# Patient Record
Sex: Female | Born: 1945 | Race: Black or African American | Hispanic: No | Marital: Single | Smoking: Former smoker
Health system: Southern US, Community
[De-identification: ages and names within clinical notes are randomized; demographics above are authoritative.]

## PROBLEM LIST (undated history)

## (undated) DIAGNOSIS — K469 Unspecified abdominal hernia without obstruction or gangrene: Secondary | ICD-10-CM

## (undated) DIAGNOSIS — I10 Essential (primary) hypertension: Secondary | ICD-10-CM

## (undated) DIAGNOSIS — K589 Irritable bowel syndrome without diarrhea: Secondary | ICD-10-CM

## (undated) DIAGNOSIS — M1990 Unspecified osteoarthritis, unspecified site: Secondary | ICD-10-CM

## (undated) DIAGNOSIS — K219 Gastro-esophageal reflux disease without esophagitis: Secondary | ICD-10-CM

## (undated) DIAGNOSIS — E876 Hypokalemia: Secondary | ICD-10-CM

## (undated) DIAGNOSIS — G8929 Other chronic pain: Secondary | ICD-10-CM

## (undated) DIAGNOSIS — E669 Obesity, unspecified: Secondary | ICD-10-CM

## (undated) DIAGNOSIS — E119 Type 2 diabetes mellitus without complications: Secondary | ICD-10-CM

## (undated) DIAGNOSIS — I639 Cerebral infarction, unspecified: Secondary | ICD-10-CM

## (undated) DIAGNOSIS — K5730 Diverticulosis of large intestine without perforation or abscess without bleeding: Secondary | ICD-10-CM

## (undated) DIAGNOSIS — D126 Benign neoplasm of colon, unspecified: Secondary | ICD-10-CM

## (undated) DIAGNOSIS — M549 Dorsalgia, unspecified: Secondary | ICD-10-CM

## (undated) DIAGNOSIS — IMO0002 Reserved for concepts with insufficient information to code with codable children: Secondary | ICD-10-CM

## (undated) DIAGNOSIS — E785 Hyperlipidemia, unspecified: Secondary | ICD-10-CM

## (undated) DIAGNOSIS — K602 Anal fissure, unspecified: Secondary | ICD-10-CM

## (undated) DIAGNOSIS — I6529 Occlusion and stenosis of unspecified carotid artery: Secondary | ICD-10-CM

## (undated) HISTORY — DX: Essential (primary) hypertension: I10

## (undated) HISTORY — DX: Unspecified osteoarthritis, unspecified site: M19.90

## (undated) HISTORY — DX: Hypokalemia: E87.6

## (undated) HISTORY — DX: Anal fissure, unspecified: K60.2

## (undated) HISTORY — DX: Other chronic pain: G89.29

## (undated) HISTORY — DX: Occlusion and stenosis of unspecified carotid artery: I65.29

## (undated) HISTORY — PX: POLYPECTOMY: SHX149

## (undated) HISTORY — DX: Cerebral infarction, unspecified: I63.9

## (undated) HISTORY — DX: Hyperlipidemia, unspecified: E78.5

## (undated) HISTORY — DX: Obesity, unspecified: E66.9

## (undated) HISTORY — PX: TONSILLECTOMY: SUR1361

## (undated) HISTORY — DX: Irritable bowel syndrome, unspecified: K58.9

## (undated) HISTORY — DX: Benign neoplasm of colon, unspecified: D12.6

## (undated) HISTORY — DX: Reserved for concepts with insufficient information to code with codable children: IMO0002

## (undated) HISTORY — PX: COLONOSCOPY: SHX174

## (undated) HISTORY — DX: Gastro-esophageal reflux disease without esophagitis: K21.9

## (undated) HISTORY — DX: Type 2 diabetes mellitus without complications: E11.9

## (undated) HISTORY — DX: Dorsalgia, unspecified: M54.9

## (undated) HISTORY — DX: Diverticulosis of large intestine without perforation or abscess without bleeding: K57.30

## (undated) HISTORY — DX: Unspecified abdominal hernia without obstruction or gangrene: K46.9

---

## 1969-04-16 HISTORY — PX: PARTIAL HYSTERECTOMY: SHX80

## 1999-02-03 ENCOUNTER — Emergency Department (HOSPITAL_COMMUNITY): Admission: EM | Admit: 1999-02-03 | Discharge: 1999-02-03 | Payer: Self-pay | Admitting: Emergency Medicine

## 2001-11-17 ENCOUNTER — Encounter: Payer: Self-pay | Admitting: Emergency Medicine

## 2001-11-17 ENCOUNTER — Emergency Department (HOSPITAL_COMMUNITY): Admission: EM | Admit: 2001-11-17 | Discharge: 2001-11-17 | Payer: Self-pay | Admitting: Emergency Medicine

## 2002-06-15 ENCOUNTER — Emergency Department (HOSPITAL_COMMUNITY): Admission: EM | Admit: 2002-06-15 | Discharge: 2002-06-15 | Payer: Self-pay | Admitting: Emergency Medicine

## 2002-06-15 ENCOUNTER — Encounter: Payer: Self-pay | Admitting: Emergency Medicine

## 2004-10-31 ENCOUNTER — Emergency Department (HOSPITAL_COMMUNITY): Admission: EM | Admit: 2004-10-31 | Discharge: 2004-10-31 | Payer: Self-pay | Admitting: Emergency Medicine

## 2007-04-17 DIAGNOSIS — K5730 Diverticulosis of large intestine without perforation or abscess without bleeding: Secondary | ICD-10-CM

## 2007-04-17 HISTORY — DX: Diverticulosis of large intestine without perforation or abscess without bleeding: K57.30

## 2007-05-01 ENCOUNTER — Inpatient Hospital Stay (HOSPITAL_COMMUNITY): Admission: EM | Admit: 2007-05-01 | Discharge: 2007-05-04 | Payer: Self-pay | Admitting: Emergency Medicine

## 2007-05-03 ENCOUNTER — Encounter: Payer: Self-pay | Admitting: Gastroenterology

## 2007-05-07 ENCOUNTER — Ambulatory Visit: Payer: Self-pay | Admitting: Gastroenterology

## 2009-07-04 ENCOUNTER — Encounter: Payer: Self-pay | Admitting: *Deleted

## 2009-07-19 ENCOUNTER — Emergency Department (HOSPITAL_COMMUNITY): Admission: EM | Admit: 2009-07-19 | Discharge: 2009-07-19 | Payer: Self-pay | Admitting: Emergency Medicine

## 2009-07-21 ENCOUNTER — Emergency Department (HOSPITAL_COMMUNITY): Admission: EM | Admit: 2009-07-21 | Discharge: 2009-07-22 | Payer: Self-pay | Admitting: Emergency Medicine

## 2009-07-28 ENCOUNTER — Emergency Department (HOSPITAL_COMMUNITY): Admission: EM | Admit: 2009-07-28 | Discharge: 2009-07-28 | Payer: Self-pay | Admitting: Emergency Medicine

## 2009-08-07 ENCOUNTER — Emergency Department (HOSPITAL_COMMUNITY): Admission: EM | Admit: 2009-08-07 | Discharge: 2009-08-07 | Payer: Self-pay | Admitting: Emergency Medicine

## 2009-08-07 ENCOUNTER — Encounter (INDEPENDENT_AMBULATORY_CARE_PROVIDER_SITE_OTHER): Payer: Self-pay | Admitting: Emergency Medicine

## 2009-08-07 ENCOUNTER — Ambulatory Visit: Payer: Self-pay | Admitting: Surgery

## 2009-08-20 ENCOUNTER — Emergency Department (HOSPITAL_COMMUNITY): Admission: EM | Admit: 2009-08-20 | Discharge: 2009-08-20 | Payer: Self-pay | Admitting: Emergency Medicine

## 2009-08-25 ENCOUNTER — Inpatient Hospital Stay (HOSPITAL_COMMUNITY): Admission: EM | Admit: 2009-08-25 | Discharge: 2009-08-28 | Payer: Self-pay | Admitting: Emergency Medicine

## 2009-09-16 ENCOUNTER — Ambulatory Visit: Payer: Self-pay | Admitting: Family Medicine

## 2009-11-17 ENCOUNTER — Inpatient Hospital Stay (HOSPITAL_COMMUNITY): Admission: EM | Admit: 2009-11-17 | Discharge: 2009-11-17 | Payer: Self-pay | Admitting: Emergency Medicine

## 2010-02-03 ENCOUNTER — Ambulatory Visit: Payer: Self-pay | Admitting: Family Medicine

## 2010-02-03 ENCOUNTER — Encounter (INDEPENDENT_AMBULATORY_CARE_PROVIDER_SITE_OTHER): Payer: Self-pay | Admitting: *Deleted

## 2010-02-03 LAB — CONVERTED CEMR LAB
BUN: 11 mg/dL (ref 6–23)
Basophils Absolute: 0 10*3/uL (ref 0.0–0.1)
Basophils Relative: 0 % (ref 0–1)
CO2: 23 meq/L (ref 19–32)
Calcium: 9.2 mg/dL (ref 8.4–10.5)
Chloride: 109 meq/L (ref 96–112)
Creatinine, Ser: 0.72 mg/dL (ref 0.40–1.20)
Eosinophils Absolute: 0.2 10*3/uL (ref 0.0–0.7)
Eosinophils Relative: 3 % (ref 0–5)
Glucose, Bld: 113 mg/dL — ABNORMAL HIGH (ref 70–99)
HCT: 40.2 % (ref 36.0–46.0)
Hemoglobin: 13.3 g/dL (ref 12.0–15.0)
Lymphocytes Relative: 38 % (ref 12–46)
Lymphs Abs: 2.8 10*3/uL (ref 0.7–4.0)
MCHC: 33.1 g/dL (ref 30.0–36.0)
MCV: 86.6 fL (ref 78.0–100.0)
Monocytes Absolute: 0.8 10*3/uL (ref 0.1–1.0)
Monocytes Relative: 11 % (ref 3–12)
Neutro Abs: 3.5 10*3/uL (ref 1.7–7.7)
Neutrophils Relative %: 48 % (ref 43–77)
Platelets: 279 10*3/uL (ref 150–400)
Potassium: 4.2 meq/L (ref 3.5–5.3)
RBC: 4.64 M/uL (ref 3.87–5.11)
RDW: 13.7 % (ref 11.5–15.5)
Sodium: 144 meq/L (ref 135–145)
WBC: 7.2 10*3/uL (ref 4.0–10.5)

## 2010-02-21 ENCOUNTER — Ambulatory Visit (HOSPITAL_COMMUNITY): Admission: RE | Admit: 2010-02-21 | Discharge: 2010-02-21 | Payer: Self-pay | Admitting: Family Medicine

## 2010-04-11 ENCOUNTER — Encounter (INDEPENDENT_AMBULATORY_CARE_PROVIDER_SITE_OTHER): Payer: Self-pay | Admitting: *Deleted

## 2010-04-11 LAB — CONVERTED CEMR LAB
ALT: 14 units/L (ref 0–35)
AST: 16 units/L (ref 0–37)
Albumin: 4.4 g/dL (ref 3.5–5.2)
Alkaline Phosphatase: 92 units/L (ref 39–117)
Amphetamine Screen, Ur: NEGATIVE
BUN: 16 mg/dL (ref 6–23)
Barbiturate Quant, Ur: NEGATIVE
Benzodiazepines.: NEGATIVE
CO2: 24 meq/L (ref 19–32)
Calcium: 9.7 mg/dL (ref 8.4–10.5)
Chloride: 106 meq/L (ref 96–112)
Cocaine Metabolites: NEGATIVE
Creatinine, Ser: 0.83 mg/dL (ref 0.40–1.20)
Creatinine,U: 80.5 mg/dL
Glucose, Bld: 110 mg/dL — ABNORMAL HIGH (ref 70–99)
Lipase: 33 units/L (ref 0–75)
Marijuana Metabolite: POSITIVE — AB
Methadone: NEGATIVE
Opiate Screen, Urine: NEGATIVE
Phencyclidine (PCP): NEGATIVE
Potassium: 4 meq/L (ref 3.5–5.3)
Propoxyphene: NEGATIVE
Sodium: 142 meq/L (ref 135–145)
TSH: 1.875 microintl units/mL (ref 0.350–4.500)
Total Bilirubin: 0.2 mg/dL — ABNORMAL LOW (ref 0.3–1.2)
Total Protein: 7.7 g/dL (ref 6.0–8.3)
Vitamin B-12: 268 pg/mL (ref 211–911)

## 2010-04-20 ENCOUNTER — Ambulatory Visit (HOSPITAL_COMMUNITY): Admission: RE | Admit: 2010-04-20 | Payer: Self-pay | Source: Home / Self Care | Admitting: Family Medicine

## 2010-04-20 ENCOUNTER — Emergency Department (HOSPITAL_COMMUNITY)
Admission: EM | Admit: 2010-04-20 | Discharge: 2010-04-20 | Payer: Self-pay | Source: Home / Self Care | Admitting: Emergency Medicine

## 2010-05-12 ENCOUNTER — Emergency Department (HOSPITAL_COMMUNITY)
Admission: EM | Admit: 2010-05-12 | Discharge: 2010-05-12 | Payer: Self-pay | Source: Home / Self Care | Admitting: Emergency Medicine

## 2010-05-17 ENCOUNTER — Encounter: Payer: Self-pay | Admitting: Family Medicine

## 2010-05-18 NOTE — Miscellaneous (Signed)
Summary: Do Not Reschedule  Missed NP appt.  Per [facility] policy is not allowed to reschedule.  Dennison Nancy RN  July 04, 2009 4:33 PM

## 2010-06-14 ENCOUNTER — Emergency Department (HOSPITAL_COMMUNITY): Payer: Medicare Other

## 2010-06-14 ENCOUNTER — Observation Stay (HOSPITAL_COMMUNITY)
Admission: EM | Admit: 2010-06-14 | Discharge: 2010-06-15 | Disposition: A | Discharge status: Home / Self Care | Payer: Medicare Other | Attending: Internal Medicine | Admitting: Internal Medicine

## 2010-06-14 DIAGNOSIS — J45909 Unspecified asthma, uncomplicated: Secondary | ICD-10-CM | POA: Insufficient documentation

## 2010-06-14 DIAGNOSIS — G8929 Other chronic pain: Secondary | ICD-10-CM | POA: Insufficient documentation

## 2010-06-14 DIAGNOSIS — I10 Essential (primary) hypertension: Secondary | ICD-10-CM | POA: Insufficient documentation

## 2010-06-14 DIAGNOSIS — K219 Gastro-esophageal reflux disease without esophagitis: Secondary | ICD-10-CM | POA: Insufficient documentation

## 2010-06-14 DIAGNOSIS — I2510 Atherosclerotic heart disease of native coronary artery without angina pectoris: Secondary | ICD-10-CM | POA: Insufficient documentation

## 2010-06-14 DIAGNOSIS — Z79899 Other long term (current) drug therapy: Secondary | ICD-10-CM | POA: Insufficient documentation

## 2010-06-14 DIAGNOSIS — F17200 Nicotine dependence, unspecified, uncomplicated: Secondary | ICD-10-CM | POA: Insufficient documentation

## 2010-06-14 DIAGNOSIS — R0789 Other chest pain: Principal | ICD-10-CM | POA: Insufficient documentation

## 2010-06-14 DIAGNOSIS — F1210 Cannabis abuse, uncomplicated: Secondary | ICD-10-CM | POA: Insufficient documentation

## 2010-06-14 DIAGNOSIS — E876 Hypokalemia: Secondary | ICD-10-CM | POA: Insufficient documentation

## 2010-06-14 DIAGNOSIS — M545 Low back pain, unspecified: Secondary | ICD-10-CM | POA: Insufficient documentation

## 2010-06-14 DIAGNOSIS — Z8673 Personal history of transient ischemic attack (TIA), and cerebral infarction without residual deficits: Secondary | ICD-10-CM | POA: Insufficient documentation

## 2010-06-14 DIAGNOSIS — E785 Hyperlipidemia, unspecified: Secondary | ICD-10-CM | POA: Insufficient documentation

## 2010-06-14 LAB — CBC
HCT: 41.8 % (ref 36.0–46.0)
Hemoglobin: 14.3 g/dL (ref 12.0–15.0)
MCH: 27.7 pg (ref 26.0–34.0)
MCHC: 34.2 g/dL (ref 30.0–36.0)
MCV: 80.9 fL (ref 78.0–100.0)
Platelets: 343 10*3/uL (ref 150–400)
RBC: 5.17 MIL/uL — ABNORMAL HIGH (ref 3.87–5.11)
RDW: 13.2 % (ref 11.5–15.5)
WBC: 8.7 10*3/uL (ref 4.0–10.5)

## 2010-06-14 LAB — POCT CARDIAC MARKERS
CKMB, poc: <1 ng/mL — ABNORMAL LOW (ref 1.0–8.0)
Myoglobin, poc: 58.1 ng/mL (ref 12–200)
Troponin i, poc: <0.05 ng/mL (ref 0.00–0.09)

## 2010-06-14 LAB — COMPREHENSIVE METABOLIC PANEL
ALT: 22 U/L (ref 0–35)
AST: 22 U/L (ref 0–37)
Albumin: 3.8 g/dL (ref 3.5–5.2)
Alkaline Phosphatase: 88 U/L (ref 39–117)
BUN: 7 mg/dL (ref 6–23)
CO2: 29 mEq/L (ref 19–32)
Calcium: 9.5 mg/dL (ref 8.4–10.5)
Chloride: 96 mEq/L (ref 96–112)
Creatinine, Ser: 0.88 mg/dL (ref 0.4–1.2)
GFR calc Af Amer: >60 mL/min (ref >60)
GFR calc non Af Amer: >60 mL/min (ref >60)
Glucose, Bld: 154 mg/dL — ABNORMAL HIGH (ref 70–99)
Potassium: 2.7 mEq/L — CL (ref 3.5–5.1)
Sodium: 137 mEq/L (ref 135–145)
Total Bilirubin: 0.4 mg/dL (ref 0.3–1.2)
Total Protein: 7.6 g/dL (ref 6.0–8.3)

## 2010-06-14 LAB — RAPID URINE DRUG SCREEN, HOSP PERFORMED
Amphetamines: NOT DETECTED
Barbiturates: NOT DETECTED
Benzodiazepines: NOT DETECTED
Cocaine: NOT DETECTED
Opiates: POSITIVE — AB
Tetrahydrocannabinol: POSITIVE — AB

## 2010-06-14 LAB — HEMOGLOBIN A1C
Hgb A1c MFr Bld: 8.5 % — ABNORMAL HIGH (ref <5.7)
Mean Plasma Glucose: 197 mg/dL — ABNORMAL HIGH (ref <117)

## 2010-06-14 LAB — DIFFERENTIAL
Basophils Absolute: 0 10*3/uL (ref 0.0–0.1)
Basophils Relative: 0 % (ref 0–1)
Eosinophils Absolute: 0.1 10*3/uL (ref 0.0–0.7)
Eosinophils Relative: 2 % (ref 0–5)
Lymphocytes Relative: 46 % (ref 12–46)
Lymphs Abs: 4 10*3/uL (ref 0.7–4.0)
Monocytes Absolute: 0.7 10*3/uL (ref 0.1–1.0)
Monocytes Relative: 8 % (ref 3–12)
Neutro Abs: 3.8 10*3/uL (ref 1.7–7.7)
Neutrophils Relative %: 44 % (ref 43–77)

## 2010-06-14 LAB — PROTIME-INR
INR: 0.96 (ref 0.00–1.49)
Prothrombin Time: 13 seconds (ref 11.6–15.2)

## 2010-06-14 LAB — TROPONIN I: Troponin I: 0.02 ng/mL (ref 0.00–0.06)

## 2010-06-14 LAB — CK TOTAL AND CKMB (NOT AT ARMC)
CK, MB: 1.2 ng/mL (ref 0.3–4.0)
Relative Index: INVALID (ref 0.0–2.5)
Total CK: 94 U/L (ref 7–177)

## 2010-06-14 LAB — APTT: aPTT: 30 seconds (ref 24–37)

## 2010-06-15 ENCOUNTER — Ambulatory Visit (HOSPITAL_COMMUNITY)
Admission: AD | Admit: 2010-06-15 | Discharge: 2010-06-15 | Disposition: A | Discharge status: Home / Self Care | Payer: Medicare Other | Source: Ambulatory Visit | Attending: Cardiovascular Disease | Admitting: Cardiovascular Disease

## 2010-06-15 DIAGNOSIS — F17200 Nicotine dependence, unspecified, uncomplicated: Secondary | ICD-10-CM | POA: Insufficient documentation

## 2010-06-15 DIAGNOSIS — I2510 Atherosclerotic heart disease of native coronary artery without angina pectoris: Secondary | ICD-10-CM | POA: Insufficient documentation

## 2010-06-15 DIAGNOSIS — R0789 Other chest pain: Secondary | ICD-10-CM | POA: Insufficient documentation

## 2010-06-15 LAB — CBC
HCT: 37.7 % (ref 36.0–46.0)
Hemoglobin: 12.6 g/dL (ref 12.0–15.0)
MCH: 27.5 pg (ref 26.0–34.0)
MCHC: 33.4 g/dL (ref 30.0–36.0)
MCV: 82.1 fL (ref 78.0–100.0)
Platelets: 319 10*3/uL (ref 150–400)
RBC: 4.59 MIL/uL (ref 3.87–5.11)
RDW: 13.4 % (ref 11.5–15.5)
WBC: 7.9 10*3/uL (ref 4.0–10.5)

## 2010-06-15 LAB — CARDIAC PANEL(CRET KIN+CKTOT+MB+TROPI)
CK, MB: 1.1 ng/mL (ref 0.3–4.0)
CK, MB: 1 ng/mL (ref 0.3–4.0)
Relative Index: 1 (ref 0.0–2.5)
Relative Index: 1 (ref 0.0–2.5)
Total CK: 108 U/L (ref 7–177)
Total CK: 103 U/L (ref 7–177)
Troponin I: 0.01 ng/mL (ref 0.00–0.06)
Troponin I: 0.01 ng/mL (ref 0.00–0.06)

## 2010-06-15 LAB — LIPID PANEL
Cholesterol: 239 mg/dL — ABNORMAL HIGH (ref 0–200)
HDL: 38 mg/dL — ABNORMAL LOW (ref >39)
LDL Cholesterol: UNDETERMINED mg/dL (ref 0–99)
Total CHOL/HDL Ratio: 6.3 RATIO
Triglycerides: 565 mg/dL — ABNORMAL HIGH (ref <150)
VLDL: UNDETERMINED mg/dL (ref 0–40)

## 2010-06-15 LAB — BASIC METABOLIC PANEL
BUN: 7 mg/dL (ref 6–23)
CO2: 27 mEq/L (ref 19–32)
Calcium: 8.8 mg/dL (ref 8.4–10.5)
Chloride: 103 mEq/L (ref 96–112)
Creatinine, Ser: 0.76 mg/dL (ref 0.4–1.2)
GFR calc Af Amer: >60 mL/min (ref >60)
GFR calc non Af Amer: >60 mL/min (ref >60)
Glucose, Bld: 140 mg/dL — ABNORMAL HIGH (ref 70–99)
Potassium: 4.1 mEq/L (ref 3.5–5.1)
Sodium: 137 mEq/L (ref 135–145)

## 2010-06-15 LAB — TROPONIN I
Troponin I: 0.01 ng/mL (ref 0.00–0.06)
Troponin I: 0.01 ng/mL (ref 0.00–0.06)

## 2010-06-15 LAB — POCT ACTIVATED CLOTTING TIME: Activated Clotting Time: 122 seconds

## 2010-06-15 LAB — HEPARIN LEVEL (UNFRACTIONATED): Heparin Unfractionated: 0.15 IU/mL — ABNORMAL LOW (ref 0.30–0.70)

## 2010-06-15 LAB — MAGNESIUM: Magnesium: 2.2 mg/dL (ref 1.5–2.5)

## 2010-06-16 NOTE — H&P (Signed)
NAMEMarland Kitchen  Margaret Rangel, Margaret Rangel NO.:  [id number]  MEDICAL RECORD NO.:  [id number]           PATIENT TYPE:  E  LOCATION:  WLED                         FACILITY:  [facility]  PHYSICIAN:  Vania Rea, M.D. DATE OF BIRTH:  [birth year]  DATE OF ADMISSION:  06/14/2010 DATE OF DISCHARGE:                             HISTORY & PHYSICAL   PRIMARY CARE PHYSICIAN:  Dr. Norberto Sorenson at [facility].  CHIEF COMPLAINT:  Chest pain.  HISTORY OF PRESENT ILLNESS:  This is a 65 year old African American lady with a history of hypertension and strong family history of cardiac disease who reports that she has been having episodic chest pains for the past 2 weeks.  The patient says she felt the pain was due to gas and has been taking gas medicine for it, but it has not been helping.  When she was questioned specifically as to what type of a gas medicine she was taking, she pointed to her diuretic chlorthalidone and says she has been taking that once per day for the chest pains without relief.  She denies taking any conventional antacids.  Eventually, the pain became so severe she came to the emergency room where she received some nitroglycerin x3, which took the pain from a 10/10 to a 3/10.  The pain was not associated with exertion, breathing, nor position.  It was aggravated by anxiety and getting upset, and seemed to be relieved by calming down or listening to preachers on television, per the patient. She has been having no nausea nor vomiting, no diaphoresis nor syncope, no passage of bloody or black stool.  She does have a history of heavy alcohol abuse, but says she has used no alcohol for the past 3 months. She also has a history of tobacco abuse, but says she smokes only one or two cigarettes every 2 or 3 weeks.  She also has a history of marijuana abuse, but says this helps her with her chronic pain and she uses it only every 2 or 3 weeks, and she last used it last night.  The  patient was admitted for chest pains in August of this year, but left against medical advice.  The patient is very resistant to being admitted on this occasion, but reports she is only being admitted on the promise that she will be discharged tomorrow.  She does not want to leave against medical advice because it may cause problems with her insurance.  The patient also reports she has chronic back problems and is due to get an injection in her back in the coming June for her pain.  She says she gets these injections every 6 months.  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  MEDICATIONS:  Include: 1. Amlodipine 10 mg daily. 2. Trazodone 50 mg at bedtime when needed. 3. Ultram 50 mg 3 times daily as needed, but she reports she typically     takes it much more often. 4. Chlorthalidone 25 mg daily. 5. Ventolin inhaler 2  puffs every 4 hours as needed.  ALLERGIES: 1. CODEINE. 2. PENICILLIN.  SOCIAL HISTORY:  She lives with her daughter.  Tobacco and alcohol use as noted above.  She denies illicit drug use.  She is a retired [employer].  FAMILY HISTORY:  Significant for two sisters with coronary artery disease (one with a recent CABG), a mother who died of heart failure. Both sisters, mother, and father had diabetes.  REVIEW OF SYSTEMS:  Other than noted above, significant only for pains in her back and shoulders, related degenerative joint disease.  PHYSICAL EXAM:  GENERAL:  An anxious middle-aged African American lady sitting up in the bed. VITAL SIGNS:  Her temperature is 98.3, pulse 80, respirations 20, blood pressure 128/65.  She is saturating at 98% on 2 L. HEENT:  Her pupils are round and equal.  Mucous membranes pink. Anicteric.  No cervical lymphadenopathy or thyromegaly.   No carotid bruit. CHEST:  Clear to auscultation bilaterally. CARDIOVASCULAR SYSTEM:  Regular rhythm.  She has a 3/6 systolic murmur. She has no reproducible chest wall tenderness.  She has no epigastric tenderness. ABDOMEN:  Soft, nontender.  No masses. EXTREMITIES:  Without edema.  She has 2+ dorsalis pedis pulses bilaterally. CENTRAL NERVOUS SYSTEM:  Cranial nerves 2-12 are grossly intact.  She has no focal lateralizing signs.  LABS:  Her white count is 8.7, hemoglobin 14.3, platelets 243.  She has a normal differential.  Her sodium is 137, her potassium is 2.7, chloride 96, CO2 29, glucose 154, BUN 7, creatinine 0.88, her calcium is 9.5, and her liver functions are completely normal.  Her cardiac enzymes are completely normal with a myoglobin of 58.  Undetectable CK-MB and troponins.  Her portable chest x-ray shows no acute disease.  EKG shows normal sinus rhythm with no ST-segment abnormalities.  ASSESSMENT: 1. Unstable angina as evidenced by chest pain, relieved by     nitroglycerin, although there many atypical factors for this pain.     Her risk factors include her age, her hypertension, her family     history, her tobacco abuse. 2. Hypertension. 3. Hypokalemia related to the use of chlorthalidone. 4. Chronic pains. 5. History of substance abuse.  PLAN:  Will admit this lady to replete her potassium, will get serial cardiac enzymes, will consult a cardiologist for assistance with management.  Other plans as per orders.    Vania Rea, M.D.    LC/MEDQ  D:  06/14/2010  T:  06/14/2010  Job:  [id number]  Electronically Signed by Vania Rea M.D. on 06/16/2010 02:54:34 AM

## 2010-06-28 NOTE — Procedures (Signed)
  NAME:  Margaret Rangel, Margaret Rangel NO.:  [id number]  MEDICAL RECORD NO.:  [id number]           PATIENT TYPE:  O  LOCATION:  CATH                         FACILITY:  [facility]  PHYSICIAN:  Nanetta Batty, M.D.   DATE OF BIRTH:  01-29-1946  DATE OF PROCEDURE: DATE OF DISCHARGE:                           CARDIAC CATHETERIZATION   Ms. Salts is a 65 year old single African American female, mother of 3 with risk factors including family history of hypertension, and tobacco abuse.  She has had 1 month of chest pain consistent with unstable angina.  She was admitted to [facility] with accelerated symptoms.  Her point-of-care markers were negative.  Her EKG showed no acute changes.  Her urine was positive for THC and opiates.  She was heparinized and presents now for diagnostic coronary arteriography to define her anatomy and rule out ischemic etiology.  DESCRIPTION OF PROCEDURE:  The patient was brought to second floor [facility] in a postabsorptive state.  She was premedicated with p.o. Valium, IV versed, and fentanyl.  Her right groin was prepped and shaved in usual sterile fashion.  Xylocaine 1% was used for local anesthesia.  A 5-French sheath was inserted in the right femoral artery using standard Seldinger technique.  A 5-French right and left Judkins diagnostic catheters as well as 5-French pigtail catheter were used for selective cholangiography and left ventriculography respectively. Visipaque dye was used for entirety of the case.  Retrograde aortic, left ventricular, and pullback pressures were recorded.  HEMODYNAMIC RESULTS: 1. Aortic systolic pressure 121, diastolic pressure 59. 2. Left ventricular systolic pressure 126 and end-diastolic pressure     13.  SELECTIVE CORONARY ANGIOGRAPHY: 1. Left main normal. 2. LAD; the LAD had a 50% stenosis on the band in the midportion.     Small diagonal branch arose from this and had 80% ostial  stenosis. 3. Left circumflex; free of significant disease. 4. Right coronary artery; dominant, free of significant disease.  LEFT VENTRICULOGRAPHY:  RAO left ventriculogram was performed using 25 mL of Visipaque dye at 12 mL per second.  The overall LVEF estimated greater than 70% with cavity obliteration.  IMPRESSION: 1. 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  I believe her     chest pain is noncardiac. 2. Medical therapy will be recommended including risk factor     modification and empiric antireflux therapy.  ACT was measured and the sheath was removed.  Pressure was held on the groin to achieve hemostasis.  The patient left lab in stable condition.     Nanetta Batty, M.D.     JB/MEDQ  D:  06/15/2010  T:  06/15/2010  Job:  [id number]  cc:   [facility]  Electronically Signed by Nanetta Batty M.D. on 06/28/2010 01:59:36 PM

## 2010-06-28 NOTE — Discharge Summary (Signed)
Margaret Rangel, Margaret Rangel NO.:  [id number]  MEDICAL RECORD NO.:  [id number]           PATIENT TYPE:  I  LOCATION:  1414                         FACILITY:  [facility]  PHYSICIAN:  Osvaldo Shipper, MD     DATE OF BIRTH:  21-Mar-1946  DATE OF ADMISSION:  06/14/2010 DATE OF DISCHARGE:  06/15/2010                              DISCHARGE SUMMARY   PRIMARY CARE PHYSICIAN:  The patient's primary care physician is Dr. Norberto Sorenson at [facility].  She was seen by [facility].  PROCEDURES PERFORMED DURING THIS ADMISSION:  Include cardiac catheterization, final report is still pending but did not show any significant coronary artery disease.  LAD had 50% disease and D2 small 50% ostial.  OTHER IMAGING STUDIES PERFORMED:  Include a chest x-ray which did not show any active cardiopulmonary disease process.  PERTINENT LABS:  Include a potassium of 2.7 when she was admitted, corrected to 4.1 this morning.  Her HbA1c was 8.5.  Her cardiac enzymes were negative.  Her total cholesterol 239, triglycerides 565, HDL 38, LDL was not calculated.  Urine drug screen positive for opiates and cannabinoids.  DISCHARGE DIAGNOSES: 1. Chest pain, possibly from acid reflux. 2. Minimal coronary artery disease, medical management. 3. Elevated HbA1c, requiring outpatient followup. 4. History of hypertension. 5. Hypercholesterolemia requiring initiation of medications. 6. Gastroesophageal reflux disease.  BRIEF HOSPITAL COURSE:  Briefly, this is a 65 year old African-American female who has a family history of heart disease who presented to the hospital with complaints of chest pain which was relieved with nitroglycerin in the emergency department.  The patient was subsequently admitted for further evaluation.  She was seen by [facility] and they did a cardiac cath on her this morning which showed only minimal CAD without any significant obstruction.  They feel  that the chest pain was possibly noncardiac.  Antacid reflux therapy was recommended.  The patient's pain resolved with nitroglycerin.  She has not had any recurrence of her pain.  She is otherwise feeling well this morning.  Of note, her potassium was 2.7 when she was admitted.  This was repleted aggressively and it has come up to 4.1.  She did have elevated sugar, elevated glucose that is yesterday afternoon at 154, it is 140 this morning.  HbA1c is 8.5.  For this, we would recommend the patient follow up with her PCP to discuss the treatment options for her early diabetes.  She does have hypercholesterolemia with a cholesterol of 239 with a triglyceride of 565.  So, we will initiate simvastatin.  On the day of discharge, the patient is feeling well.  She denies any chest pain, is keen on going home.  Her vital signs are all stable.  Her lungs are clear to auscultation.  Cardiovascular, S1, S2 is normal and regular.  No S3, S4, rubs, murmurs, or bruit.  Abdomen is soft, nontender, nondistended.  Bowel sounds are present.  No masses,organomegaly is appreciated.  Her labs have been discussed earlier.  DISCHARGE MEDICATIONS: 1. Metoprolol 25 mg daily. 2. Omeprazole 20 mg twice daily. 3. Oxycodone 5 mg every 6 hours as  needed for pain, 10 tablets     prescribed. 4. Potassium chloride 20 mEq daily. 5. Simvastatin 40 mg every evening. 6. Albuterol inhaler 2 puffs inhaled every 4 hours as needed for     shortness of breath. 7. Amlodipine 10 mg daily. 8. Chlorthalidone 25 mg daily. 9. Nicotine patch transdermally daily. 10.Trazodone 50 mg as needed for insomnia q.h.s. 11.Vicks inhaler nasally daily.  We have asked her to discontinue the tramadol.  FOLLOWUP: 1. Follow up with [facility] in 1 to 2 weeks especially to discuss     her elevated blood sugar and her elevated HbA1c 2. [facility] will probably call her for followup appointment.  DIET:  Heart healthy.  PHYSICAL  ACTIVITY:  Increase activity slowly.  TOTAL TIME ON THIS DISCHARGE ENCOUNTER:  35 minutes.   Osvaldo Shipper, MD     GK/MEDQ  D:  06/15/2010  T:  06/15/2010  Job:  [id number]  cc:   Norberto Sorenson, MD Fax: [phone]  [facility]  Electronically Signed by Osvaldo Shipper MD on 06/28/2010 07:59:46 PM

## 2010-06-30 LAB — POCT CARDIAC MARKERS
CKMB, poc: 1.6 ng/mL (ref 1.0–8.0)
Myoglobin, poc: 95.8 ng/mL (ref 12–200)
Troponin i, poc: <0.05 ng/mL (ref 0.00–0.09)

## 2010-06-30 LAB — DIFFERENTIAL
Basophils Absolute: 0.1 10*3/uL (ref 0.0–0.1)
Basophils Relative: 1 % (ref 0–1)
Eosinophils Absolute: 0.2 10*3/uL (ref 0.0–0.7)
Eosinophils Relative: 3 % (ref 0–5)
Lymphocytes Relative: 40 % (ref 12–46)
Lymphs Abs: 3.5 10*3/uL (ref 0.7–4.0)
Monocytes Absolute: 1.2 10*3/uL — ABNORMAL HIGH (ref 0.1–1.0)
Monocytes Relative: 13 % — ABNORMAL HIGH (ref 3–12)
Neutro Abs: 3.8 10*3/uL (ref 1.7–7.7)
Neutrophils Relative %: 43 % (ref 43–77)

## 2010-06-30 LAB — URINALYSIS, ROUTINE W REFLEX MICROSCOPIC
Bilirubin Urine: NEGATIVE
Glucose, UA: NEGATIVE mg/dL
Hgb urine dipstick: NEGATIVE
Ketones, ur: NEGATIVE mg/dL
Nitrite: NEGATIVE
Protein, ur: NEGATIVE mg/dL
Specific Gravity, Urine: 1.006 (ref 1.005–1.030)
Urobilinogen, UA: 0.2 mg/dL (ref 0.0–1.0)
pH: 5 (ref 5.0–8.0)

## 2010-06-30 LAB — PROTIME-INR
INR: 1.04 (ref 0.00–1.49)
Prothrombin Time: 13.8 seconds (ref 11.6–15.2)

## 2010-06-30 LAB — CARDIAC PANEL(CRET KIN+CKTOT+MB+TROPI)
CK, MB: 2.4 ng/mL (ref 0.3–4.0)
Relative Index: 1.1 (ref 0.0–2.5)
Total CK: 216 U/L — ABNORMAL HIGH (ref 7–177)
Troponin I: 0.02 ng/mL (ref 0.00–0.06)

## 2010-06-30 LAB — LIPID PANEL
Cholesterol: 204 mg/dL — ABNORMAL HIGH (ref 0–200)
HDL: 51 mg/dL (ref >39)
LDL Cholesterol: 120 mg/dL — ABNORMAL HIGH (ref 0–99)
Total CHOL/HDL Ratio: 4 RATIO
Triglycerides: 167 mg/dL — ABNORMAL HIGH (ref <150)
VLDL: 33 mg/dL (ref 0–40)

## 2010-06-30 LAB — RAPID URINE DRUG SCREEN, HOSP PERFORMED
Amphetamines: NOT DETECTED
Barbiturates: NOT DETECTED
Benzodiazepines: NOT DETECTED
Cocaine: NOT DETECTED
Opiates: NOT DETECTED
Tetrahydrocannabinol: POSITIVE — AB

## 2010-06-30 LAB — APTT: aPTT: 34 seconds (ref 24–37)

## 2010-06-30 LAB — CK TOTAL AND CKMB (NOT AT ARMC)
CK, MB: 2.4 ng/mL (ref 0.3–4.0)
Relative Index: 2 (ref 0.0–2.5)
Total CK: 122 U/L (ref 7–177)

## 2010-06-30 LAB — COMPREHENSIVE METABOLIC PANEL
ALT: 17 U/L (ref 0–35)
AST: 26 U/L (ref 0–37)
Albumin: 3.5 g/dL (ref 3.5–5.2)
Alkaline Phosphatase: 63 U/L (ref 39–117)
BUN: 12 mg/dL (ref 6–23)
CO2: 23 mEq/L (ref 19–32)
Calcium: 8.8 mg/dL (ref 8.4–10.5)
Chloride: 108 mEq/L (ref 96–112)
Creatinine, Ser: 0.97 mg/dL (ref 0.4–1.2)
GFR calc Af Amer: >60 mL/min (ref >60)
GFR calc non Af Amer: 58 mL/min — ABNORMAL LOW (ref >60)
Glucose, Bld: 142 mg/dL — ABNORMAL HIGH (ref 70–99)
Potassium: 3.2 mEq/L — ABNORMAL LOW (ref 3.5–5.1)
Sodium: 140 mEq/L (ref 135–145)
Total Bilirubin: 0.7 mg/dL (ref 0.3–1.2)
Total Protein: 6.9 g/dL (ref 6.0–8.3)

## 2010-06-30 LAB — CBC
HCT: 37.6 % (ref 36.0–46.0)
Hemoglobin: 12.9 g/dL (ref 12.0–15.0)
MCH: 29.8 pg (ref 26.0–34.0)
MCHC: 34.2 g/dL (ref 30.0–36.0)
MCV: 86.9 fL (ref 78.0–100.0)
Platelets: 324 10*3/uL (ref 150–400)
RBC: 4.32 MIL/uL (ref 3.87–5.11)
RDW: 13.6 % (ref 11.5–15.5)
WBC: 8.8 10*3/uL (ref 4.0–10.5)

## 2010-06-30 LAB — LIPASE, BLOOD: Lipase: 31 U/L (ref 11–59)

## 2010-06-30 LAB — HEPATIC FUNCTION PANEL
ALT: 19 U/L (ref 0–35)
AST: 23 U/L (ref 0–37)
Albumin: 3.6 g/dL (ref 3.5–5.2)
Alkaline Phosphatase: 68 U/L (ref 39–117)
Bilirubin, Direct: <0.1 mg/dL (ref 0.0–0.3)
Total Bilirubin: 0.4 mg/dL (ref 0.3–1.2)
Total Protein: 7.4 g/dL (ref 6.0–8.3)

## 2010-06-30 LAB — BASIC METABOLIC PANEL
BUN: 12 mg/dL (ref 6–23)
CO2: 24 mEq/L (ref 19–32)
Calcium: 9.2 mg/dL (ref 8.4–10.5)
Chloride: 105 mEq/L (ref 96–112)
Creatinine, Ser: 0.92 mg/dL (ref 0.4–1.2)
GFR calc Af Amer: >60 mL/min (ref >60)
GFR calc non Af Amer: >60 mL/min (ref >60)
Glucose, Bld: 109 mg/dL — ABNORMAL HIGH (ref 70–99)
Potassium: 3.3 mEq/L — ABNORMAL LOW (ref 3.5–5.1)
Sodium: 138 mEq/L (ref 135–145)

## 2010-06-30 LAB — MAGNESIUM: Magnesium: 2 mg/dL (ref 1.5–2.5)

## 2010-06-30 LAB — TROPONIN I: Troponin I: 0.01 ng/mL (ref 0.00–0.06)

## 2010-07-03 LAB — BASIC METABOLIC PANEL
BUN: 8 mg/dL (ref 6–23)
BUN: 9 mg/dL (ref 6–23)
CO2: 26 mEq/L (ref 19–32)
CO2: 31 mEq/L (ref 19–32)
Calcium: 9.5 mg/dL (ref 8.4–10.5)
Calcium: 9.5 mg/dL (ref 8.4–10.5)
Chloride: 103 mEq/L (ref 96–112)
Chloride: 98 mEq/L (ref 96–112)
Creatinine, Ser: 0.58 mg/dL (ref 0.4–1.2)
Creatinine, Ser: 0.67 mg/dL (ref 0.4–1.2)
GFR calc Af Amer: >60 mL/min (ref >60)
GFR calc Af Amer: >60 mL/min (ref >60)
GFR calc non Af Amer: >60 mL/min (ref >60)
GFR calc non Af Amer: >60 mL/min (ref >60)
Glucose, Bld: 94 mg/dL (ref 70–99)
Glucose, Bld: 99 mg/dL (ref 70–99)
Potassium: 2.8 mEq/L — ABNORMAL LOW (ref 3.5–5.1)
Potassium: 3.4 mEq/L — ABNORMAL LOW (ref 3.5–5.1)
Sodium: 136 mEq/L (ref 135–145)
Sodium: 139 mEq/L (ref 135–145)

## 2010-07-03 LAB — HEPATIC FUNCTION PANEL
ALT: 22 U/L (ref 0–35)
AST: 34 U/L (ref 0–37)
Albumin: 4.1 g/dL (ref 3.5–5.2)
Alkaline Phosphatase: 75 U/L (ref 39–117)
Bilirubin, Direct: <0.1 mg/dL (ref 0.0–0.3)
Total Bilirubin: 0.6 mg/dL (ref 0.3–1.2)
Total Protein: 7.9 g/dL (ref 6.0–8.3)

## 2010-07-03 LAB — CBC
HCT: 38 % (ref 36.0–46.0)
Hemoglobin: 13 g/dL (ref 12.0–15.0)
MCHC: 34.2 g/dL (ref 30.0–36.0)
MCV: 87.7 fL (ref 78.0–100.0)
Platelets: 312 10*3/uL (ref 150–400)
RBC: 4.33 MIL/uL (ref 3.87–5.11)
RDW: 12.7 % (ref 11.5–15.5)
WBC: 11.9 10*3/uL — ABNORMAL HIGH (ref 4.0–10.5)

## 2010-07-04 LAB — HEMOGLOBIN AND HEMATOCRIT, BLOOD
HCT: 35.1 % — ABNORMAL LOW (ref 36.0–46.0)
HCT: 35.8 % — ABNORMAL LOW (ref 36.0–46.0)
HCT: 36 % (ref 36.0–46.0)
HCT: 36.6 % (ref 36.0–46.0)
HCT: 36.7 % (ref 36.0–46.0)
HCT: 38.8 % (ref 36.0–46.0)
HCT: 38.9 % (ref 36.0–46.0)
Hemoglobin: 12.2 g/dL (ref 12.0–15.0)
Hemoglobin: 12.2 g/dL (ref 12.0–15.0)
Hemoglobin: 12.3 g/dL (ref 12.0–15.0)
Hemoglobin: 12.4 g/dL (ref 12.0–15.0)
Hemoglobin: 12.5 g/dL (ref 12.0–15.0)
Hemoglobin: 13.1 g/dL (ref 12.0–15.0)
Hemoglobin: 13.3 g/dL (ref 12.0–15.0)

## 2010-07-04 LAB — URINALYSIS, ROUTINE W REFLEX MICROSCOPIC
Bilirubin Urine: NEGATIVE
Glucose, UA: NEGATIVE mg/dL
Hgb urine dipstick: NEGATIVE
Ketones, ur: NEGATIVE mg/dL
Nitrite: NEGATIVE
Protein, ur: NEGATIVE mg/dL
Specific Gravity, Urine: 1.019 (ref 1.005–1.030)
Urobilinogen, UA: 0.2 mg/dL (ref 0.0–1.0)
pH: 5 (ref 5.0–8.0)

## 2010-07-04 LAB — COMPREHENSIVE METABOLIC PANEL
ALT: 15 U/L (ref 0–35)
AST: 19 U/L (ref 0–37)
Albumin: 3.9 g/dL (ref 3.5–5.2)
Alkaline Phosphatase: 79 U/L (ref 39–117)
BUN: 8 mg/dL (ref 6–23)
CO2: 19 mEq/L (ref 19–32)
Calcium: 9.2 mg/dL (ref 8.4–10.5)
Chloride: 105 mEq/L (ref 96–112)
Creatinine, Ser: 0.75 mg/dL (ref 0.4–1.2)
GFR calc Af Amer: >60 mL/min (ref >60)
GFR calc non Af Amer: >60 mL/min (ref >60)
Glucose, Bld: 110 mg/dL — ABNORMAL HIGH (ref 70–99)
Potassium: 3.5 mEq/L (ref 3.5–5.1)
Sodium: 135 mEq/L (ref 135–145)
Total Bilirubin: 0.9 mg/dL (ref 0.3–1.2)
Total Protein: 7.7 g/dL (ref 6.0–8.3)

## 2010-07-04 LAB — LIPID PANEL
Cholesterol: 281 mg/dL — ABNORMAL HIGH (ref 0–200)
HDL: 48 mg/dL (ref >39)
LDL Cholesterol: UNDETERMINED mg/dL (ref 0–99)
Total CHOL/HDL Ratio: 5.9 RATIO
Triglycerides: 452 mg/dL — ABNORMAL HIGH (ref <150)
VLDL: UNDETERMINED mg/dL (ref 0–40)

## 2010-07-04 LAB — RAPID URINE DRUG SCREEN, HOSP PERFORMED
Amphetamines: NOT DETECTED
Barbiturates: NOT DETECTED
Benzodiazepines: POSITIVE — AB
Cocaine: NOT DETECTED
Opiates: POSITIVE — AB
Tetrahydrocannabinol: POSITIVE — AB

## 2010-07-04 LAB — HEMOCCULT GUIAC POC 1CARD (OFFICE): Fecal Occult Bld: NEGATIVE

## 2010-07-04 LAB — CBC
HCT: 41.2 % (ref 36.0–46.0)
Hemoglobin: 14.1 g/dL (ref 12.0–15.0)
MCHC: 34.1 g/dL (ref 30.0–36.0)
MCV: 87.7 fL (ref 78.0–100.0)
Platelets: 291 10*3/uL (ref 150–400)
RBC: 4.7 MIL/uL (ref 3.87–5.11)
RDW: 13.1 % (ref 11.5–15.5)
WBC: 9.7 10*3/uL (ref 4.0–10.5)

## 2010-07-04 LAB — ETHANOL: Alcohol, Ethyl (B): <5 mg/dL (ref 0–10)

## 2010-07-04 LAB — DIFFERENTIAL
Basophils Absolute: 0.1 10*3/uL (ref 0.0–0.1)
Basophils Relative: 1 % (ref 0–1)
Eosinophils Absolute: 0.2 10*3/uL (ref 0.0–0.7)
Eosinophils Relative: 3 % (ref 0–5)
Lymphocytes Relative: 49 % — ABNORMAL HIGH (ref 12–46)
Lymphs Abs: 4.8 10*3/uL — ABNORMAL HIGH (ref 0.7–4.0)
Monocytes Absolute: 1.2 10*3/uL — ABNORMAL HIGH (ref 0.1–1.0)
Monocytes Relative: 12 % (ref 3–12)
Neutro Abs: 3.5 10*3/uL (ref 1.7–7.7)
Neutrophils Relative %: 36 % — ABNORMAL LOW (ref 43–77)

## 2010-07-04 LAB — D-DIMER, QUANTITATIVE: D-Dimer, Quant: 0.43 ug/mL-FEU (ref 0.00–0.48)

## 2010-07-04 LAB — GASTRIC OCCULT BLOOD (1-CARD TO LAB): Occult Blood, Gastric: POSITIVE — AB

## 2010-07-04 LAB — LIPASE, BLOOD: Lipase: 22 U/L (ref 11–59)

## 2010-07-05 LAB — COMPREHENSIVE METABOLIC PANEL
ALT: 25 U/L (ref 0–35)
ALT: 28 U/L (ref 0–35)
AST: 26 U/L (ref 0–37)
AST: 32 U/L (ref 0–37)
Albumin: 4.2 g/dL (ref 3.5–5.2)
Albumin: 4.2 g/dL (ref 3.5–5.2)
Alkaline Phosphatase: 74 U/L (ref 39–117)
Alkaline Phosphatase: 76 U/L (ref 39–117)
BUN: 13 mg/dL (ref 6–23)
BUN: 9 mg/dL (ref 6–23)
CO2: 22 mEq/L (ref 19–32)
CO2: 25 mEq/L (ref 19–32)
Calcium: 9.5 mg/dL (ref 8.4–10.5)
Calcium: 9.7 mg/dL (ref 8.4–10.5)
Chloride: 106 mEq/L (ref 96–112)
Chloride: 106 mEq/L (ref 96–112)
Creatinine, Ser: 0.81 mg/dL (ref 0.4–1.2)
Creatinine, Ser: 0.85 mg/dL (ref 0.4–1.2)
GFR calc Af Amer: >60 mL/min (ref >60)
GFR calc Af Amer: >60 mL/min (ref >60)
GFR calc non Af Amer: >60 mL/min (ref >60)
GFR calc non Af Amer: >60 mL/min (ref >60)
Glucose, Bld: 105 mg/dL — ABNORMAL HIGH (ref 70–99)
Glucose, Bld: 112 mg/dL — ABNORMAL HIGH (ref 70–99)
Potassium: 3.3 mEq/L — ABNORMAL LOW (ref 3.5–5.1)
Potassium: 4 mEq/L (ref 3.5–5.1)
Sodium: 138 mEq/L (ref 135–145)
Sodium: 139 mEq/L (ref 135–145)
Total Bilirubin: 0.7 mg/dL (ref 0.3–1.2)
Total Bilirubin: 0.8 mg/dL (ref 0.3–1.2)
Total Protein: 8.1 g/dL (ref 6.0–8.3)
Total Protein: 8.2 g/dL (ref 6.0–8.3)

## 2010-07-05 LAB — CBC
HCT: 39.2 % (ref 36.0–46.0)
HCT: 41.7 % (ref 36.0–46.0)
Hemoglobin: 13.3 g/dL (ref 12.0–15.0)
Hemoglobin: 14.1 g/dL (ref 12.0–15.0)
MCHC: 33.9 g/dL (ref 30.0–36.0)
MCHC: 34 g/dL (ref 30.0–36.0)
MCV: 87.8 fL (ref 78.0–100.0)
MCV: 88.2 fL (ref 78.0–100.0)
Platelets: 328 10*3/uL (ref 150–400)
Platelets: 350 10*3/uL (ref 150–400)
RBC: 4.45 MIL/uL (ref 3.87–5.11)
RBC: 4.74 MIL/uL (ref 3.87–5.11)
RDW: 12.9 % (ref 11.5–15.5)
RDW: 13.4 % (ref 11.5–15.5)
WBC: 8.9 10*3/uL (ref 4.0–10.5)
WBC: 9.6 10*3/uL (ref 4.0–10.5)

## 2010-07-05 LAB — URINALYSIS, ROUTINE W REFLEX MICROSCOPIC
Bilirubin Urine: NEGATIVE
Bilirubin Urine: NEGATIVE
Glucose, UA: NEGATIVE mg/dL
Glucose, UA: NEGATIVE mg/dL
Hgb urine dipstick: NEGATIVE
Hgb urine dipstick: NEGATIVE
Ketones, ur: NEGATIVE mg/dL
Ketones, ur: NEGATIVE mg/dL
Nitrite: NEGATIVE
Nitrite: NEGATIVE
Protein, ur: NEGATIVE mg/dL
Protein, ur: NEGATIVE mg/dL
Specific Gravity, Urine: 1.008 (ref 1.005–1.030)
Specific Gravity, Urine: 1.011 (ref 1.005–1.030)
Urobilinogen, UA: 0.2 mg/dL (ref 0.0–1.0)
Urobilinogen, UA: 0.2 mg/dL (ref 0.0–1.0)
pH: 5 (ref 5.0–8.0)
pH: 5.5 (ref 5.0–8.0)

## 2010-07-05 LAB — DIFFERENTIAL
Basophils Absolute: 0.1 10*3/uL (ref 0.0–0.1)
Basophils Absolute: 0.1 10*3/uL (ref 0.0–0.1)
Basophils Relative: 1 % (ref 0–1)
Basophils Relative: 1 % (ref 0–1)
Eosinophils Absolute: 0.2 10*3/uL (ref 0.0–0.7)
Eosinophils Absolute: 0.3 10*3/uL (ref 0.0–0.7)
Eosinophils Relative: 2 % (ref 0–5)
Eosinophils Relative: 3 % (ref 0–5)
Lymphocytes Relative: 33 % (ref 12–46)
Lymphocytes Relative: 36 % (ref 12–46)
Lymphs Abs: 3.2 10*3/uL (ref 0.7–4.0)
Lymphs Abs: 3.2 10*3/uL (ref 0.7–4.0)
Monocytes Absolute: 0.7 10*3/uL (ref 0.1–1.0)
Monocytes Absolute: 1 10*3/uL (ref 0.1–1.0)
Monocytes Relative: 10 % (ref 3–12)
Monocytes Relative: 8 % (ref 3–12)
Neutro Abs: 4.7 10*3/uL (ref 1.7–7.7)
Neutro Abs: 5.1 10*3/uL (ref 1.7–7.7)
Neutrophils Relative %: 53 % (ref 43–77)
Neutrophils Relative %: 53 % (ref 43–77)

## 2010-07-05 LAB — LIPASE, BLOOD
Lipase: 27 U/L (ref 11–59)
Lipase: 30 U/L (ref 11–59)

## 2010-08-02 NOTE — H&P (Signed)
Margaret Rangel, FONNER NO.:  [id number]  MEDICAL RECORD NO.:  [id number]           PATIENT TYPE:  I  LOCATION:  1414                         FACILITY:  [facility]  PHYSICIAN:  Thereasa Solo. Ashia Dehner, M.D. DATE OF BIRTH:  Nov 12, 1945  DATE OF ADMISSION:  06/14/2010 DATE OF DISCHARGE:                             HISTORY & PHYSICAL   CHIEF COMPLAINT:  Chest pain.  HISTORY OF PRESENT ILLNESS:  Margaret Rangel is a 66 year old African American female with a history of asthma, gastroesophageal reflux disease, hypertension, alcoholic hepatitis, alcoholic gastritis, gastrointestinal bleeding, alcohol abuse, tobacco abuse, marijuana abuse, has a history of TIA, partial hysterectomy.  Margaret Rangel states she has had chest pain now for approximately 1 month or longer.  She states it feels like something sitting on her with radiation to the left shoulder in a band pain like pattern.  The pain comes and goes and she usually down and after a while it feels better.  On occasion, she has to lean forward to ease the pain.  She has also had times when the pain is so bad, it wakes her up from sleep at night.  At that time, she is soaking wet, had to change her pajamas.  She states that last night pain got even worse.  She had dizziness, diaphoresis, nausea, vomiting, shortness of breath, palpitations, headache, abdominal pain, blurry vision.  She was given 3 nitroglycerin in the ER and she said that made the pain go away and actually she has never had the pain checked out before.  She does deny any urinary symptoms, dysuria, hematuria.  Last bowel movement this morning was normal for her without hematochezia or melena.  MEDICATIONS: 1. Amlodipine 10 mg daily. 2. Trazodone 50 mg at bedtime when needed. 3. Ultram 50 mg 2 times a day as needed. 4. Chlorthalidone 25 mg daily. 5. Ventolin inhaler 2 puffs every 4 hours as needed.  ALLERGIES:  She is allergic to CODEINE and PENICILLIN.  PAST  MEDICAL HISTORY: 1. Gastroesophageal reflux. 2. Hypertension. 3. Asthma. 4. Bronchitis. 5. Degenerative disk disease. 6. History of TIA. 7. Partial hysterectomy.  FAMILY HISTORY:  She currently lives with her daughter.  She smokes 3-4 cigarettes per week and has done so for the last 3-4 years.  She drinks about tow 40-ounce beers per week.  She says she does not take drugs. She does smoke marijuana.  She is not married.  She does have 3 children.  Son is currently in [place].  She has 6 grandchildren and 3 great grandchildren and one in the way.  FAMILY HISTORY:  She has a sister who had coronary artery bypass graft x4 and her baby sister has had a pacemaker.  She has had some type of open heart surgery.  Her mother had heart failure, was deceased at age 65.  Her father had a brain aneurysm, deceased at unknown age.  REVIEW OF SYSTEMS:  As per HPI.  PHYSICAL EXAMINATION:  VITAL SIGNS:  Blood pressure was 151/79, heart rate 89, temperature 97.8, respiratory rate 18, oxygen saturation 100% on 2 L. GENERAL:  The patient is obese African American  female in no apparent distress. HEENT:  Pupils equal round and reactive to light and accommodation. Extraocular movements are intact.  No scleral icterus. NECK:  Supple, nontender.  Negative lymphadenopathy. CARDIOVASCULAR:  Regular rate and rhythm.  S1 and S2.  Negative murmurs, rubs or gallops. PULMONARY:  Clear to auscultation bilaterally.  Negative rhonchi or wheezes. ABDOMEN:  Positive bowel sounds in all over quadrants.  Moderate tenderness in the suprapubic region.  Negative masses or bruits. PERIPHERAL VASCULAR:  Negative carotid or femoral bruits. Two plus dorsalis pedis, 2+ radial pulses.  Negative lower extremity edema. Negative cyanosis or clubbing. NEURO:  The patient is alert and oriented x3.  Strength is 5/5, equal in all extremities.  LABORATORY DATA:  WBC is 8.7, hemoglobin is 14.3, hematocrit 41.8, platelets 343.   Sodium 137, potassium 2.7, chloride 96, carbon dioxide 29, glucose 154, BUN 7, creatinine 0.88, alk phos 88, AST 22, ALT 22, total protein 7.6, albumin 3.8, calcium 9.5, CK-MB POC is less than 1.0. Troponin I POC is less than 0.05.  Myoglobin POC is 58.1.  She is positive for opiates and positive for tetrahydrocannabinol.  EKG, normal sinus rhythm, no acute changes, rate 87.  Chest x-ray shows no active disease.  IMPRESSION: 1. Unstable angina, negative initial cardiac enzymes. 2. Hypertension. 3. Hypokalemia, repleted.  PLAN:  The patient will be put on nitroglycerin patch 1 inch q. 24 hours. She is started on IV heparin per pharmacy.  Cardiac enzymes will be drawn q.8 hours x3.  She will be scheduled for left heart catheterization for June 15, 2010.  She will be started on metoprolol 12.5 mg b.i.d. p.o.  We will also draw lipid panel.    ______________________________ Wilburt Finlay, PA   ______________________________ Thereasa Solo. Zeric Baranowski, M.D.    Jane Canary  D:  06/14/2010  T:  06/15/2010  Job:  [id number]  Electronically Signed by Wilburt Finlay PA on 07/28/2010 04:22:02 PM Electronically Signed by Julieanne Manson M.D. on 08/02/2010 08:24:27 AM

## 2010-08-29 NOTE — H&P (Signed)
NAMEXENIA, NILE NO.:  [id number]   MEDICAL RECORD NO.:  [id number]          PATIENT TYPE:  EMS   LOCATION:  ED                           FACILITY[facility]  [facility]   PHYSICIAN:  Thomasenia Bottoms, MDDATE OF BIRTH:  [birth year]   DATE OF ADMISSION:  05/01/2007  DATE OF DISCHARGE:                              HISTORY & PHYSICAL   CHIEF COMPLAINT:  Vomiting blood.   HISTORY OF PRESENT ILLNESS:  Mrs. Striplin is a 65 year old woman who  presents to the emergency department after vomiting blood this morning.  The patient says she only did it once this morning and has never seen  that happen to her before. She has had essentially bright red to maroon  stool per rectum every day for approximately the last 2 weeks. She has  also been having some stomach pains for quite some time. This morning  she vomited blood at the same time she had the bright red blood coming  from her rectum as well.  This really worried her. Apparently, her  sister just got out of the hospital with  vomiting blood as well.   PAST MEDICAL HISTORY:  Significant for occasional bronchitis.  She takes  no medications.  She did have a hysterectomy for menorrhagia in the  distant past.   SOCIAL HISTORY:  She drinks alcohol heavily.  Beer is her alcohol of  choice, and she has been doing this for quite some time.  She denies any  withdrawal symptoms or DTs. She also smoke cigarettes.  She denies any  illicit drug use.   FAMILY HISTORY:  Significant for brother who recently died of prostate  cancer.   REVIEW OF SYSTEMS:  CONSTITUTIONAL:  She thinks she has lost 10-15  pounds in the last month. She has not had any appetite recently.  No  fevers or night sweats.  HEENT: She does have occasional headache.  She  does have blurry vision.  She does occasionally have swallowing  difficulty also, no sore throat.  CARDIOVASCULAR:  She  does  occasionally have some chest pains, though none today.  No lower  extremity edema.  No orthopnea.  RESPIRATORY: She does she denies any  shortness of breath and no hemoptysis. GI: Please see HPI.  She has  terrible trouble with heartburn and acid reflux.  She has a lot of  trouble with stomach pains as well.  She reports some swallowing  difficulties periodically.  She feels like her throat is very dry often  and then of course there is the vomiting blood and bright red blood per  rectum. MUSCULOSKELETAL:  She does have joint pains.  She sites her knee  and hip as being the most worrisome. She does take Advil, but she says  she has been taking Advil for her stomach pain.  INTEGUMENTARY:  She  denies any open lesions or rashes.  HEMATOLOGIC:  She  denies any  trouble bruising easily. NEUROLOGIC:  She denies any asymmetric weakness  or paresthesias.  No seizures.  All other systems reviewed and are  negative.   EXAMINATION:  VITAL SIGNS:  On arrival, her temperature was 99.1, blood  pressure 157/76, pulse 110, respiratory 22, satting 99% on room air.  GENERAL:  The patient is in no acute distress.  HEENT:  Normocephalic, atraumatic.  Pupils are equal and round.  Her  sclerae are muddy. Oral mucosa moist.  NECK:  Supple.  No  lymphadenopathy, no thyromegaly, no jugular venous distention.  CARDIAC:  Regular rate and rhythm.  LUNGS:  Clear to auscultation bilaterally.  No wheezes, rhonchi or  rales.  ABDOMEN:  Her abdomen is soft.  She has minimal tenderness in the  epigastric area, but no rebound or guarding.  She does have bowel  sounds.  No masses are appreciated.  EXTREMITIES:  Reveal no evidence of clubbing, cyanosis or edema.  She  has palpable DP pulses bilaterally.  SKIN:  Her skin is warm and dry  with no rashes or open lesions.  NEUROLOGICALLY:  Her cranial nerves II-XII are intact grossly.  She is  alert and oriented x3.  She has 5/5 strength in her upper and lower  extremities.  Her sensory exam is intact grossly in her lower  extremities.   She has a normal gait.  MUSCULOSKELETAL:  Examination reveals good range of motion with no  evidence of effusion of her joints.   DATA:  White count  6.1, hemoglobin 12.4, hematocrit 35.4, platelet  count is 226.  Sodium is 140, potassium 3.3, chloride 105, bicarb 23,  glucose 98, BUN 10, creatinine 0.76, AST is 131, total bili is 1.1,  ALT  117,  albumin is low at 2.7.  Her PT is 14.6, INR is 1.1. Her lipase is  17, blood alcohol level is 102, her platelet count is 226. The patient's  EKG reveals normal sinus rhythm with a rate of 88.  She has some  nonspecific T-wave changes and possible septal infarct.  No ST-segment  elevation or depression.   ASSESSMENT/PLAN:  1. Gastrointestinal bleed in a patient who drinks alcohol and takes      Advil regularly.  She is hemodynamically stable.  We will admit her      to the hospital, put her on IV fluids,  make her n.p.o., put her on      IV Prevacid twice daily and consult GI.  Should her hemoglobin      falls below 10, we will certainly consider transfusing her.      Currently her hemoglobin is 12.  2. Mild hypokalemia.  We will replace this.  3. Alcohol abuse.  I did counsel the patient to quit.  She currently      still has alcohol in her system with a blood alcohol level of 102.      I will put her on p.r.n. Ativan at this time, and if she should      exhibit any significant withdrawal symptoms, we will change her to      a scheduled Ativan dosing for detox protocol at that time.  4. Tobacco abuse.  The patient will need to be counseled to quit this      as well.  5. Hypertension.  The patient has no known history of hypertension.      Her  blood pressures have been slightly high so far in the      emergency department.  We will follow this while she is here in the      hospital.   This patient currently does not have a primary care physician.  Thomasenia Bottoms, MD  Electronically Signed     CVC/MEDQ  D:  05/01/2007  T:   05/02/2007  Job:  [id number]

## 2010-08-29 NOTE — Discharge Summary (Signed)
Margaret Rangel NO.:  [id number]   MEDICAL RECORD NO.:  [id number]          PATIENT TYPE:  INP   LOCATION:  1418                         FACILITY:  [facility]   PHYSICIAN:  Hind I Elsaid, MD      DATE OF BIRTH:  1946/01/31   DATE OF ADMISSION:  05/01/2007  DATE OF DISCHARGE:  05/05/2007                               DISCHARGE SUMMARY   DISCHARGE DIAGNOSES:  1. Acute gastric ulcer without hemorrhage.  2. Result of hematemesis thought to be secondary to number 1.  3. Acute gastritis, Helicobacter pylori negative.  4. Alcoholic hepatitis.  5. Hypertension.  6. Alcohol abuse.  7. Current smoker.  Discharge M EDICATIONS:  1 PROTONIX 40 MG PO DAILY  2-FOLIC ACID I MG PO DAILY  3-CLONIDINE 0.1 mg po BID  4.MULTIVITAMIN itab daily  5 -thimine 100 mg daily   CONSULTATION:  Gastroenterology consulted for evaluation of hematemesis.   PROCEDURES:  1. Had EGD which showed gastritis and gastric ulcer without evidence      of active bleeding.  2. Colonoscopy.  Diverticulosis and colon polyps.   HISTORY OF PRESENT ILLNESS:  Please review the History done by Dr.  Buena Irish.  In summary, a 65 year old female, history of heavy  alcohol drinking, presented to the emergency room after hematemesis and  dark maroon stools for the last 2 weeks.  Hemoglobin remained stable  during hospitalization.  Patient was placed on IV Protonix and  Gastroenterology was consulted.  Patient was hemodynamically stable.  Underwent EGD and colonoscopy, result as above.  Patient is prescribed  Protonix p.o.  Helicobacter pylori CLO test was negative, no need for  prescription or any medications for that.  Elevated LFTs felt to be  secondary to alcoholic hepatitis.  Patient found to have high blood  pressure.  Clonidine 0.1 mg p.o. b.i.d. was prescribed and patient was  advised to follow with [facility] for further recommendation regarding  blood pressure adjustment.  During  hospitalization patient had no  complications from alcohol withdrawal and remained stable.  We felt that  the patient was medically stable to be discharged home on Protonix and  clonidine, follow with her primary care for the adjustment of her blood  pressure.     Hind Bosie Helper, MD  Electronically Signed    HIE/MEDQ  D:  05/04/2007  T:  05/04/2007  Job:  [id number]

## 2010-09-18 ENCOUNTER — Ambulatory Visit: Payer: Medicare Other | Admitting: Physical Medicine & Rehabilitation

## 2010-09-18 ENCOUNTER — Encounter: Payer: Medicare Other | Attending: Physical Medicine & Rehabilitation

## 2010-09-18 DIAGNOSIS — M48061 Spinal stenosis, lumbar region without neurogenic claudication: Secondary | ICD-10-CM | POA: Insufficient documentation

## 2010-09-18 DIAGNOSIS — M79609 Pain in unspecified limb: Secondary | ICD-10-CM | POA: Insufficient documentation

## 2010-09-18 DIAGNOSIS — M5126 Other intervertebral disc displacement, lumbar region: Secondary | ICD-10-CM | POA: Insufficient documentation

## 2010-09-18 DIAGNOSIS — IMO0002 Reserved for concepts with insufficient information to code with codable children: Secondary | ICD-10-CM

## 2010-09-19 NOTE — Procedures (Signed)
[name], Margaret Rangel NO.:  [id number]  MEDICAL RECORD NO.:  [id number]           PATIENT TYPE:  O  LOCATION:  TPC                          FACILITY:  [facility]  PHYSICIAN:  Erick Colace, M.D.DATE OF BIRTH:  02/23/1946  DATE OF PROCEDURE: DATE OF DISCHARGE:                              OPERATIVE REPORT  PROCEDURE:  Right L4-5 transforaminal lumbar epidural steroid injection under fluoroscopic guidance.  INDICATION:  Right lower extremity pain.  She has had good results with L4 nerve root block in the past.  She has had evaluation by Neurosurgery, after MRI demonstrated broad-based protrusion at L4-5 facet and ligamentous hypertrophy, stenosis of the lateral recess potential for right L4 nerve root compression.  She is not taking any anticoagulants at the current time.  She has a prior history of hypertension.  She had some dental work done but has had resolution after tooth extraction.  Informed consent was obtained after describing risks and benefits of the procedure with the patient.  These include bleeding, bruising, and infection.  She elects to proceed and has given written consent.  The patient placed prone on fluoroscopy table.  Betadine prep, sterile drape, a 25-gauge inch and half needle was used to anesthetize skin and subcu tissue 1% lidocaine x2 mL.  Then a 22-gauge 3-1/2-inch spinal needle was inserted into L4-5 intervertebral foramen under AP lateral and oblique imaging.  Omnipaque 180 under live fluoro x2 mL showed good nerve root outline spreading into the subcuticular region followed by injection of 1 mL of 10 mg/mL dexamethasone and 2 mL of 1% MPF lidocaine.  The patient tolerated procedure well.  Postprocedure instructions given.     Erick Colace, M.D. Electronically Signed    AEK/MEDQ  D:  09/18/2010 12:30:51  T:  09/19/2010 00:18:53  Job:  [id number]

## 2010-09-21 ENCOUNTER — Encounter: Payer: Medicare Other | Attending: Family Medicine | Admitting: *Deleted

## 2010-09-21 DIAGNOSIS — E119 Type 2 diabetes mellitus without complications: Secondary | ICD-10-CM | POA: Insufficient documentation

## 2010-09-21 DIAGNOSIS — I10 Essential (primary) hypertension: Secondary | ICD-10-CM | POA: Insufficient documentation

## 2010-09-21 DIAGNOSIS — E789 Disorder of lipoprotein metabolism, unspecified: Secondary | ICD-10-CM | POA: Insufficient documentation

## 2010-09-21 DIAGNOSIS — Z713 Dietary counseling and surveillance: Secondary | ICD-10-CM | POA: Insufficient documentation

## 2010-10-16 ENCOUNTER — Ambulatory Visit: Payer: Medicare Other | Admitting: Physical Medicine & Rehabilitation

## 2010-10-16 ENCOUNTER — Encounter: Payer: PRIVATE HEALTH INSURANCE | Attending: Physical Medicine & Rehabilitation

## 2010-10-16 DIAGNOSIS — M5126 Other intervertebral disc displacement, lumbar region: Secondary | ICD-10-CM | POA: Insufficient documentation

## 2010-10-16 DIAGNOSIS — M79609 Pain in unspecified limb: Secondary | ICD-10-CM | POA: Insufficient documentation

## 2010-10-16 DIAGNOSIS — M48061 Spinal stenosis, lumbar region without neurogenic claudication: Secondary | ICD-10-CM | POA: Insufficient documentation

## 2010-10-16 DIAGNOSIS — IMO0002 Reserved for concepts with insufficient information to code with codable children: Secondary | ICD-10-CM

## 2010-10-16 NOTE — Procedures (Signed)
NAMEEVY, LUTTERMAN NO.:  [id number]  MEDICAL RECORD NO.:  [id number]           PATIENT TYPE:  O  LOCATION:  TPC                          FACILITY:  [facility]  PHYSICIAN:  Erick Colace, M.D.DATE OF BIRTH:  1945/12/19  DATE OF PROCEDURE: DATE OF DISCHARGE:                              OPERATIVE REPORT  PROCEDURE:  L4-5 translaminar lumbar epidural steroid injection right paramedian approach.  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  Pain is only partially responsive to medication management and other conservative care, interferes with activities such as standing for prolonged period of time.  She did not benefit from a L4-5 transforaminal approach, lumbar epidural steroid injection.  She is here for a lumbar translaminar today.  No anticoagulant used.  No recent antibiotics for infections.  Informed consent was obtained after describing risks and benefits of the procedure with the patient.  These include bleeding, bruising and infection.  She elects to proceed and has given written consent.  The patient placed prone on fluoroscopy table.  Betadine prep, sterile drape 25-gauge inch and half needle was used to anesthetize the skin and subcu tissue 1% lidocaine x2 mL.  Then, a 17-gauge Tuohy needle was inserted under fluoroscopic guidance into the L4-5 interlaminar space right paramedian approach.  AP, lateral and oblique imaging utilized. Omnipaque 180 under live fluoro demonstrated good epidural spread x2 mL and 2 mL of 40 mg/mL Depo-Medrol and 2 mL of 1% MPF lidocaine injected after negative drawback for blood.  The patient tolerated the procedure well.  Loss-of-resistance technique was utilized.  Postprocedure instructions given.  Post procedure education given. Postprocedure vitals stable.     Erick Colace, M.D. Electronically Signed    AEK/MEDQ  D:  10/16/2010 11:20:45  T:   10/16/2010 13:05:33  Job:  [id number]  cc:   Dineen Kid. Reche Dixon, M.D. Fax: [phone]

## 2010-10-31 ENCOUNTER — Encounter: Payer: Self-pay | Admitting: Gastroenterology

## 2010-11-08 ENCOUNTER — Encounter: Payer: Self-pay | Admitting: Gastroenterology

## 2010-11-13 ENCOUNTER — Ambulatory Visit: Payer: Medicare Other | Admitting: Gastroenterology

## 2010-12-22 ENCOUNTER — Encounter: Payer: Self-pay | Admitting: Gastroenterology

## 2010-12-22 ENCOUNTER — Ambulatory Visit (INDEPENDENT_AMBULATORY_CARE_PROVIDER_SITE_OTHER): Payer: Medicare Other | Admitting: Gastroenterology

## 2010-12-22 DIAGNOSIS — K625 Hemorrhage of anus and rectum: Secondary | ICD-10-CM

## 2010-12-22 DIAGNOSIS — R112 Nausea with vomiting, unspecified: Secondary | ICD-10-CM

## 2010-12-22 DIAGNOSIS — E119 Type 2 diabetes mellitus without complications: Secondary | ICD-10-CM | POA: Insufficient documentation

## 2010-12-22 MED ORDER — PANTOPRAZOLE SODIUM 40 MG PO TBEC: 40.0000 mg | DELAYED_RELEASE_TABLET | Freq: Every day | ORAL | Status: DC

## 2010-12-22 NOTE — Progress Notes (Signed)
History of Present Illness:  Mrs. Moncayo is a 65 year old AA female with history of diabetes, hypertension, IBS, referred at the request of Dr. Ricki Miller for evaluation of vomiting and rectal bleeding. Within minutes of eating she developed nausea with vomiting. She's having pyrosis with or without vomiting. She denies dysphagia. She's also had  rectal bleeding consisting of bright red blood in the toilet water  following a bowel movement. This has subsequently subsided. Colonoscopy in 2009 demonstrated a hyperplastic polyp and diverticulosis.    Review of Systems: She has chronic low back pain. Pertinent positive and negative review of systems were noted in the above HPI section. All other review of systems were otherwise negative.    Current Medications, Allergies, Past Medical History, Past Surgical History, Family History and Social History were reviewed in [employer] electronic medical record  Vital signs were reviewed in today's medical record. Physical Exam: General: Well developed , well nourished, no acute distress Head: Normocephalic and atraumatic Eyes:  sclerae anicteric, EOMI Ears: Normal auditory acuity Mouth: No deformity or lesions Lungs: Clear throughout to auscultation Heart: Regular rate and rhythm; no murmurs, rubs or bruits Abdomen: Soft, non tender and non distended. No masses, hepatosplenomegaly or hernias noted. Normal Bowel sounds Rectal: There are no external abnormalities Musculoskeletal: Symmetrical with no gross deformities  Pulses:  Normal pulses noted Extremities: No clubbing, cyanosis, edema or deformities noted Neurological: Alert oriented x 4, grossly nonfocal Psychological:  Alert and cooperative. Normal mood and affect

## 2010-12-22 NOTE — Patient Instructions (Signed)
Colonoscopy A colonoscopy is an exam to evaluate your entire colon. In this exam, your colon is cleansed. A long fiberoptic tube is inserted through your rectum and into your colon. The fiberoptic scope (endoscope) is a long bundle of enclosed and very flexible fibers. These fibers transmit light to the area examined and send images from that area to your caregiver. Discomfort is usually minimal. You may be given a drug to help you sleep (sedative) during or prior to the procedure. This exam helps to detect lumps (tumors), polyps, inflammation, and areas of bleeding. Your caregiver may also take a small piece of tissue (biopsy) that will be examined under a microscope. BEFORE THE PROCEDURE  A clear liquid diet may be required for 2 days before the exam.   Liquid injections (enemas) or laxatives may be required.   A large amount of electrolyte solution may be given to you to drink over a short period of time. This solution is used to clean out your colon.   You should be present 1 prior to your procedure or as directed by your caregiver.   Check in at the admissions desk to fill out necessary forms if not preregistered. There will be consent forms to sign prior to the procedure. If accompanied by friends or family, there is a waiting area for them while you are having your procedure.  LET YOUR CAREGIVER KNOW ABOUT:  Allergies to food or medicine.  Medicines taken, including vitamins, herbs, eyedrops, over-the-counter medicines, and creams.   Use of steroids (by mouth or creams).   Previous problems with anesthetics or numbing medicines.   History of bleeding problems or blood clots.  Previous surgery.   Other health problems, including diabetes and kidney problems.   Possibility of pregnancy, if this applies.   AFTER THE PROCEDURE  If you received a sedative and/or pain medicine, you will need to arrange for someone to drive you home.   Occasionally, there is a little blood passed  with the first bowel movement. DO NOT be concerned.  HOME CARE INSTRUCTIONS  It is not unusual to pass moderate amounts of gas and experience mild abdominal cramping following the procedure. This is due to air being used to inflate your colon during the exam. Walking or a warm pack on your belly (abdomen) may help.   You may resume all normal meals and activities after sedatives and medicines have worn off.   Only take over-the-counter or prescription medicines for pain, discomfort, or fever as directed by your caregiver. DO NOT use aspirin or blood thinners if a biopsy was taken. Consult your caregiver for medicine usage if biopsies were taken.  FINDING OUT THE RESULTS OF YOUR TEST Not all test results are available during your visit. If your test results are not back during the visit, make an appointment with your caregiver to find out the results. Do not assume everything is normal if you have not heard from your caregiver or the medical facility. It is important for you to follow up on all of your test results. SEEK IMMEDIATE MEDICAL CARE IF:You pass large blood clots or fill a toilet with blood following the procedure. This may also occur 10 to 14 days following the procedure. This is more likely if a biopsy was taken.   You develop abdominal pain that keeps getting worse and cannot be relieved with medicine.  Document Released: 03/30/2000 Document Re-Released: 06/27/2009 [facility] Patient Information 2011 [place], [place]. Your colonoscopy is scheduled on 12/25/2010 at 2:30pm We  have given you a SuPrep sample kit today

## 2010-12-22 NOTE — Assessment & Plan Note (Addendum)
Symptoms could be due to ulcer or  nonulcer dyspepsia. Gastroparesis should also be considered.  Recommendations #1 begin Protonix 40 mg daily #2 upper endoscopy #3 to consider gastric emptying scan pending results of the endoscopy

## 2010-12-22 NOTE — Assessment & Plan Note (Addendum)
Limited rectal bleeding could be due to hemorrhoids or a anal fissure. A more proximal colonic bleeding source should be ruled out.  Recommendations #1 colonoscopy  Risks, alternatives, and complications of the procedure, including bleeding, perforation, and possible need for surgery, were explained to the patient.  Patient's questions were answered.

## 2010-12-24 ENCOUNTER — Telehealth: Payer: Self-pay | Admitting: Internal Medicine

## 2010-12-24 NOTE — Telephone Encounter (Signed)
Pt called to state she mixed up her colon prep instructions and started and completed 1/2 of the prep Sunday at 6 am, rather than 6 pm.  She is now having BMs.  No other problems.  I have instructed that she remain on clear liquids the remainder of today, and finish the 2nd half of the prep on Monday at 6 am as instructed. This should still yield an adequate prep. She voiced understanding and thanked me for the call.

## 2010-12-25 ENCOUNTER — Ambulatory Visit (AMBULATORY_SURGERY_CENTER): Payer: Medicare Other | Admitting: Gastroenterology

## 2010-12-25 ENCOUNTER — Encounter: Payer: Self-pay | Admitting: Gastroenterology

## 2010-12-25 ENCOUNTER — Other Ambulatory Visit: Payer: Self-pay | Admitting: Gastroenterology

## 2010-12-25 ENCOUNTER — Other Ambulatory Visit: Payer: Self-pay

## 2010-12-25 DIAGNOSIS — D1330 Benign neoplasm of unspecified part of small intestine: Secondary | ICD-10-CM

## 2010-12-25 DIAGNOSIS — K5730 Diverticulosis of large intestine without perforation or abscess without bleeding: Secondary | ICD-10-CM

## 2010-12-25 DIAGNOSIS — R112 Nausea with vomiting, unspecified: Secondary | ICD-10-CM

## 2010-12-25 DIAGNOSIS — D126 Benign neoplasm of colon, unspecified: Secondary | ICD-10-CM

## 2010-12-25 DIAGNOSIS — K625 Hemorrhage of anus and rectum: Secondary | ICD-10-CM

## 2010-12-25 LAB — GLUCOSE, CAPILLARY
Glucose-Capillary: 123 mg/dL — ABNORMAL HIGH (ref 70–99)
Glucose-Capillary: 108 mg/dL — ABNORMAL HIGH (ref 70–99)

## 2010-12-25 MED ORDER — SODIUM CHLORIDE 0.9 % IV SOLN: 500.0000 mL | INTRAVENOUS | Status: DC

## 2010-12-25 NOTE — Patient Instructions (Signed)
See the picture page for your findings from your exam today.  Follow the green and blue discharge instruction sheets the rest of the day.  Resume your prior medications today.   Your blood sugar was 108 in the recovery room.  Please make sure you eat before taking your sugar medication.  The 3rd floor nurse will call you to  Schedule a gastric empting scan that will be done in the radiology department.

## 2010-12-25 NOTE — Progress Notes (Signed)
Pt voiced no complaints in the recovery room or on discharge. MAW  I went with the pt to the restroom.  No problems noted.  MAW

## 2010-12-26 ENCOUNTER — Telehealth: Payer: Self-pay | Admitting: *Deleted

## 2010-12-26 ENCOUNTER — Telehealth: Payer: Self-pay

## 2010-12-26 NOTE — Telephone Encounter (Signed)

## 2010-12-26 NOTE — Telephone Encounter (Signed)
Pt scheduled for GES at [facility] 01/03/11 arrival time 9:45am for 10am appt time. Pt to be NPO after midnight and hold her Protonix 24 hours prior to the test. Pt aware.

## 2011-01-03 ENCOUNTER — Encounter (HOSPITAL_COMMUNITY)
Admission: RE | Admit: 2011-01-03 | Discharge: 2011-01-03 | Disposition: A | Discharge status: Home / Self Care | Payer: PRIVATE HEALTH INSURANCE | Source: Ambulatory Visit | Attending: Gastroenterology | Admitting: Gastroenterology

## 2011-01-03 ENCOUNTER — Telehealth: Payer: Self-pay | Admitting: *Deleted

## 2011-01-03 NOTE — Telephone Encounter (Signed)
Schedule UGI series

## 2011-01-03 NOTE — Telephone Encounter (Signed)
PT NEEDS CALL PER NM, PT VOMITIED HER EGG FOR HER GES. IS VERY EMOTIONAL

## 2011-01-03 NOTE — Telephone Encounter (Signed)
Pt scheduled for upper gi series at [facility] 01/05/11@10 :30am, pt to arrive at 10:15am. Pt to be NPO after midnight. Pt aware of appt date and time.

## 2011-01-03 NOTE — Telephone Encounter (Signed)
Called and left message for pt to call back.

## 2011-01-03 NOTE — Telephone Encounter (Signed)
Pt went for her GES today and was unable to keep the egg down, she vomited. Radiology told her they could not do the test. Pt is calling wanting to know what she should do next. Dr. Arlyce Dice please advise.

## 2011-01-04 LAB — CLOTEST (H. PYLORI), BIOPSY: Helicobacter screen: NEGATIVE — AB

## 2011-01-04 LAB — COMPREHENSIVE METABOLIC PANEL
ALT: 112 — ABNORMAL HIGH
ALT: 68 — ABNORMAL HIGH
ALT: 81 — ABNORMAL HIGH
AST: 117 — ABNORMAL HIGH
AST: 189 — ABNORMAL HIGH
AST: 91 — ABNORMAL HIGH
Albumin: 2.3 — ABNORMAL LOW
Albumin: 2.4 — ABNORMAL LOW
Albumin: 2.7 — ABNORMAL LOW
Alkaline Phosphatase: 111
Alkaline Phosphatase: 114
Alkaline Phosphatase: 131 — ABNORMAL HIGH
BUN: 10
BUN: 11
BUN: 3 — ABNORMAL LOW
CO2: 23
CO2: 23
CO2: 26
Calcium: 7.9 — ABNORMAL LOW
Calcium: 8.1 — ABNORMAL LOW
Calcium: 8.4
Chloride: 105
Chloride: 111
Chloride: 112
Creatinine, Ser: 0.76
Creatinine, Ser: 0.78
Creatinine, Ser: 0.83
GFR calc Af Amer: >60
GFR calc Af Amer: >60
GFR calc Af Amer: >60
GFR calc non Af Amer: >60
GFR calc non Af Amer: >60
GFR calc non Af Amer: >60
Glucose, Bld: 81
Glucose, Bld: 84
Glucose, Bld: 98
Potassium: 3.3 — ABNORMAL LOW
Potassium: 3.5
Potassium: 3.7
Sodium: 140
Sodium: 143
Sodium: 143
Total Bilirubin: 0.9
Total Bilirubin: 1
Total Bilirubin: 1.1
Total Protein: 4.6 — ABNORMAL LOW
Total Protein: 4.8 — ABNORMAL LOW
Total Protein: 5.6 — ABNORMAL LOW

## 2011-01-04 LAB — BASIC METABOLIC PANEL
BUN: <1 — ABNORMAL LOW
CO2: 28
Calcium: 8.6
Chloride: 107
Creatinine, Ser: 0.78
GFR calc Af Amer: >60
GFR calc non Af Amer: >60
Glucose, Bld: 93
Potassium: 3.7
Sodium: 143

## 2011-01-04 LAB — CBC
HCT: 29.6 — ABNORMAL LOW
HCT: 30.1 — ABNORMAL LOW
HCT: 32.7 — ABNORMAL LOW
HCT: 35.4 — ABNORMAL LOW
Hemoglobin: 10.5 — ABNORMAL LOW
Hemoglobin: 10.6 — ABNORMAL LOW
Hemoglobin: 11.4 — ABNORMAL LOW
Hemoglobin: 12.4
MCHC: 34.9
MCHC: 35.2
MCHC: 35.3
MCHC: 35.4
MCV: 87.9
MCV: 88.3
MCV: 89.2
MCV: 90.5
Platelets: 170
Platelets: 174
Platelets: 191
Platelets: 226
RBC: 3.32 — ABNORMAL LOW
RBC: 3.43 — ABNORMAL LOW
RBC: 3.61 — ABNORMAL LOW
RBC: 4
RDW: 14.3
RDW: 14.3
RDW: 14.4
RDW: 14.6
WBC: 5.6
WBC: 5.6
WBC: 5.9
WBC: 6.1

## 2011-01-04 LAB — PROTIME-INR
INR: 1.1
Prothrombin Time: 14.6

## 2011-01-04 LAB — DIFFERENTIAL
Basophils Absolute: 0.1
Basophils Relative: 1
Eosinophils Absolute: 0
Eosinophils Relative: 0
Lymphocytes Relative: 43
Lymphs Abs: 2.6
Monocytes Absolute: 0.6
Monocytes Relative: 10
Neutro Abs: 2.8
Neutrophils Relative %: 47

## 2011-01-04 LAB — HEMOGLOBIN AND HEMATOCRIT, BLOOD
HCT: 30.8 — ABNORMAL LOW
HCT: 32.7 — ABNORMAL LOW
Hemoglobin: 10.6 — ABNORMAL LOW
Hemoglobin: 11.2 — ABNORMAL LOW

## 2011-01-04 LAB — TYPE AND SCREEN
ABO/RH(D): O POS
Antibody Screen: NEGATIVE

## 2011-01-04 LAB — ETHANOL: Alcohol, Ethyl (B): 102 — ABNORMAL HIGH

## 2011-01-04 LAB — APTT: aPTT: 29

## 2011-01-04 LAB — ABO/RH: ABO/RH(D): O POS

## 2011-01-04 LAB — LIPASE, BLOOD: Lipase: 17

## 2011-01-05 ENCOUNTER — Ambulatory Visit (HOSPITAL_COMMUNITY)
Admission: RE | Admit: 2011-01-05 | Discharge: 2011-01-05 | Disposition: A | Discharge status: Home / Self Care | Payer: PRIVATE HEALTH INSURANCE | Source: Ambulatory Visit | Attending: Gastroenterology | Admitting: Gastroenterology

## 2011-01-05 DIAGNOSIS — R1310 Dysphagia, unspecified: Secondary | ICD-10-CM | POA: Insufficient documentation

## 2011-01-05 DIAGNOSIS — K219 Gastro-esophageal reflux disease without esophagitis: Secondary | ICD-10-CM | POA: Insufficient documentation

## 2011-01-05 DIAGNOSIS — R109 Unspecified abdominal pain: Secondary | ICD-10-CM | POA: Insufficient documentation

## 2011-01-08 NOTE — Progress Notes (Signed)
Quick Note:  No obstruction by upper GI series though it does demonstrate a significant amount of reflux into the esophagus. Patient was unable to tolerate a gastric emptying scan.  Plan a trial of Reglan 10 mg one half hour a.c. and at bedtime .Patient was instructed to contact me immediately if she develops any side effects from her Reglan including paresthesias, tremors, confusion , weakness or muscle spasms. These office visits in 2 or 3 weeks ______

## 2011-01-09 ENCOUNTER — Telehealth: Payer: Self-pay

## 2011-01-09 MED ORDER — METOCLOPRAMIDE HCL 10 MG PO TABS: ORAL_TABLET | ORAL | Status: DC

## 2011-01-09 NOTE — Telephone Encounter (Signed)
Pt aware, rx sent to the pharmacy. Pt scheduled to see Dr. Arlyce Dice 01/25/11@11 :15am. Pt aware of appt date and time.

## 2011-01-09 NOTE — Telephone Encounter (Signed)
Message copied by Michele Mcalpine on Tue Jan 09, 2011  1:27 PM ------      Message from: [name] MD D      Created: Mon Jan 08, 2011  3:34 PM       No obstruction by upper GI series though it does demonstrate a significant amount of reflux into the esophagus. Patient was unable to tolerate a gastric emptying scan.            Plan a trial of Reglan 10 mg one half hour a.c. and at bedtime      .Patient was instructed to contact me immediately  if she develops any side effects from her Reglan including paresthesias, tremors, confusion , weakness or muscle spasms.      These office visits in 2 or 3 weeks

## 2011-01-25 ENCOUNTER — Ambulatory Visit (INDEPENDENT_AMBULATORY_CARE_PROVIDER_SITE_OTHER): Payer: Medicare Other | Admitting: Gastroenterology

## 2011-01-25 ENCOUNTER — Encounter: Payer: Self-pay | Admitting: Gastroenterology

## 2011-01-25 VITALS — BP 132/62 | HR 80 | Ht 63.0 in | Wt 165.4 lb

## 2011-01-25 DIAGNOSIS — R112 Nausea with vomiting, unspecified: Secondary | ICD-10-CM

## 2011-01-25 DIAGNOSIS — K625 Hemorrhage of anus and rectum: Secondary | ICD-10-CM

## 2011-01-25 NOTE — Progress Notes (Signed)
History of Present Illness:  Margaret Rangel has returned following upper and lower endoscopy for complaints of rectal bleeding and vomiting. Both exams were entirely normal. She vomited when attempting a gastric imaging scan. An upper GI series, which I reviewed, demonstrated significant esophageal reflux. She has only spots of blood in the toilet tissue. She still complains of postprandial vomiting that may occur within minutes of eating. She started Reglan and thinks that she may be very slightly improved.    Review of Systems: Pertinent positive and negative review of systems were noted in the above HPI section. All other review of systems were otherwise negative.    Current Medications, Allergies, Past Medical History, Past Surgical History, Family History and Social History were reviewed in [employer] electronic medical record  Vital signs were reviewed in today's medical record. Physical Exam: General: Well developed , well nourished, no acute distress H Psychological:  Alert and cooperative. Normal mood and affect

## 2011-01-25 NOTE — Patient Instructions (Signed)
Your Gastric Emptying Scan is scheduled on 02/20/2011 at 10am at [facility] radiology Nothing to eat or drink after midnight  No stomach medications 24 hours prior to test that includes your Reglan as well

## 2011-01-25 NOTE — Assessment & Plan Note (Addendum)
I saw have a concern that she has gastroparesis. She clearly is refluxing as determined by upper GI series. The patient is agreeable to trying to doing a gastric empty scan again.

## 2011-01-25 NOTE — Assessment & Plan Note (Signed)
This is likely due to hemorrhoidal bleeding and is a fairly minimal problem.

## 2011-02-07 ENCOUNTER — Telehealth: Payer: Self-pay | Admitting: Gastroenterology

## 2011-02-07 NOTE — Telephone Encounter (Signed)
Pt called and cannot keep the appt for her GES. Pt given the phone number to the hospital to reschedule her appt. Pt aware.

## 2011-02-19 ENCOUNTER — Other Ambulatory Visit (HOSPITAL_COMMUNITY): Payer: Self-pay | Admitting: Family Medicine

## 2011-02-19 DIAGNOSIS — M5410 Radiculopathy, site unspecified: Secondary | ICD-10-CM

## 2011-02-20 ENCOUNTER — Other Ambulatory Visit (HOSPITAL_COMMUNITY): Payer: Medicare Other

## 2011-02-21 ENCOUNTER — Ambulatory Visit (HOSPITAL_COMMUNITY)
Admission: RE | Admit: 2011-02-21 | Discharge: 2011-02-21 | Disposition: A | Discharge status: Home / Self Care | Payer: Medicare Other | Source: Ambulatory Visit | Attending: Family Medicine | Admitting: Family Medicine

## 2011-02-21 DIAGNOSIS — M5124 Other intervertebral disc displacement, thoracic region: Secondary | ICD-10-CM | POA: Insufficient documentation

## 2011-02-21 DIAGNOSIS — M5410 Radiculopathy, site unspecified: Secondary | ICD-10-CM

## 2011-02-21 DIAGNOSIS — M5030 Other cervical disc degeneration, unspecified cervical region: Secondary | ICD-10-CM | POA: Insufficient documentation

## 2011-02-21 DIAGNOSIS — M47812 Spondylosis without myelopathy or radiculopathy, cervical region: Secondary | ICD-10-CM | POA: Insufficient documentation

## 2011-02-21 DIAGNOSIS — M5412 Radiculopathy, cervical region: Secondary | ICD-10-CM | POA: Insufficient documentation

## 2011-02-21 DIAGNOSIS — M4802 Spinal stenosis, cervical region: Secondary | ICD-10-CM | POA: Insufficient documentation

## 2011-02-21 DIAGNOSIS — M542 Cervicalgia: Secondary | ICD-10-CM | POA: Insufficient documentation

## 2011-02-21 DIAGNOSIS — R209 Unspecified disturbances of skin sensation: Secondary | ICD-10-CM | POA: Insufficient documentation

## 2011-02-26 ENCOUNTER — Other Ambulatory Visit: Payer: Self-pay | Admitting: Gastroenterology

## 2011-04-11 ENCOUNTER — Other Ambulatory Visit: Payer: Self-pay | Admitting: Gastroenterology

## 2011-04-19 ENCOUNTER — Emergency Department (HOSPITAL_COMMUNITY)
Admission: EM | Admit: 2011-04-19 | Discharge: 2011-04-19 | Disposition: A | Discharge status: Home / Self Care | Payer: Medicare Other | Attending: Emergency Medicine | Admitting: Emergency Medicine

## 2011-04-19 ENCOUNTER — Encounter (HOSPITAL_COMMUNITY): Payer: Self-pay | Admitting: *Deleted

## 2011-04-19 ENCOUNTER — Other Ambulatory Visit: Payer: Self-pay

## 2011-04-19 ENCOUNTER — Emergency Department (HOSPITAL_COMMUNITY): Payer: Medicare Other

## 2011-04-19 DIAGNOSIS — I10 Essential (primary) hypertension: Secondary | ICD-10-CM | POA: Insufficient documentation

## 2011-04-19 DIAGNOSIS — K5730 Diverticulosis of large intestine without perforation or abscess without bleeding: Secondary | ICD-10-CM | POA: Insufficient documentation

## 2011-04-19 DIAGNOSIS — R079 Chest pain, unspecified: Secondary | ICD-10-CM | POA: Insufficient documentation

## 2011-04-19 DIAGNOSIS — E119 Type 2 diabetes mellitus without complications: Secondary | ICD-10-CM | POA: Insufficient documentation

## 2011-04-19 DIAGNOSIS — R112 Nausea with vomiting, unspecified: Secondary | ICD-10-CM | POA: Insufficient documentation

## 2011-04-19 DIAGNOSIS — R1032 Left lower quadrant pain: Secondary | ICD-10-CM | POA: Insufficient documentation

## 2011-04-19 DIAGNOSIS — E876 Hypokalemia: Secondary | ICD-10-CM | POA: Insufficient documentation

## 2011-04-19 DIAGNOSIS — F17200 Nicotine dependence, unspecified, uncomplicated: Secondary | ICD-10-CM | POA: Insufficient documentation

## 2011-04-19 DIAGNOSIS — Z8673 Personal history of transient ischemic attack (TIA), and cerebral infarction without residual deficits: Secondary | ICD-10-CM | POA: Insufficient documentation

## 2011-04-19 LAB — URINALYSIS, ROUTINE W REFLEX MICROSCOPIC
Bilirubin Urine: NEGATIVE
Glucose, UA: NEGATIVE mg/dL
Hgb urine dipstick: NEGATIVE
Ketones, ur: NEGATIVE mg/dL
Leukocytes, UA: NEGATIVE
Nitrite: NEGATIVE
Protein, ur: NEGATIVE mg/dL
Specific Gravity, Urine: 1.035 — ABNORMAL HIGH (ref 1.005–1.030)
Urobilinogen, UA: 0.2 mg/dL (ref 0.0–1.0)
pH: 6.5 (ref 5.0–8.0)

## 2011-04-19 LAB — LIPASE, BLOOD: Lipase: 42 U/L (ref 11–59)

## 2011-04-19 LAB — COMPREHENSIVE METABOLIC PANEL
ALT: 29 U/L (ref 0–35)
AST: 20 U/L (ref 0–37)
Albumin: 3.9 g/dL (ref 3.5–5.2)
Alkaline Phosphatase: 114 U/L (ref 39–117)
BUN: 14 mg/dL (ref 6–23)
CO2: 27 mEq/L (ref 19–32)
Calcium: 9.7 mg/dL (ref 8.4–10.5)
Chloride: 96 mEq/L (ref 96–112)
Creatinine, Ser: 0.84 mg/dL (ref 0.50–1.10)
GFR calc Af Amer: 82 mL/min — ABNORMAL LOW (ref >90)
GFR calc non Af Amer: 71 mL/min — ABNORMAL LOW (ref >90)
Glucose, Bld: 200 mg/dL — ABNORMAL HIGH (ref 70–99)
Potassium: 2.5 mEq/L — CL (ref 3.5–5.1)
Sodium: 137 mEq/L (ref 135–145)
Total Bilirubin: 0.2 mg/dL — ABNORMAL LOW (ref 0.3–1.2)
Total Protein: 8.2 g/dL (ref 6.0–8.3)

## 2011-04-19 LAB — DIFFERENTIAL
Basophils Absolute: 0 10*3/uL (ref 0.0–0.1)
Basophils Relative: 1 % (ref 0–1)
Eosinophils Absolute: 0.1 10*3/uL (ref 0.0–0.7)
Eosinophils Relative: 1 % (ref 0–5)
Lymphocytes Relative: 47 % — ABNORMAL HIGH (ref 12–46)
Lymphs Abs: 3.8 10*3/uL (ref 0.7–4.0)
Monocytes Absolute: 0.6 10*3/uL (ref 0.1–1.0)
Monocytes Relative: 7 % (ref 3–12)
Neutro Abs: 3.6 10*3/uL (ref 1.7–7.7)
Neutrophils Relative %: 45 % (ref 43–77)

## 2011-04-19 LAB — CBC
HCT: 41.8 % (ref 36.0–46.0)
Hemoglobin: 14.2 g/dL (ref 12.0–15.0)
MCH: 27.3 pg (ref 26.0–34.0)
MCHC: 34 g/dL (ref 30.0–36.0)
MCV: 80.4 fL (ref 78.0–100.0)
Platelets: 387 10*3/uL (ref 150–400)
RBC: 5.2 MIL/uL — ABNORMAL HIGH (ref 3.87–5.11)
RDW: 12.7 % (ref 11.5–15.5)
WBC: 8.1 10*3/uL (ref 4.0–10.5)

## 2011-04-19 LAB — POTASSIUM: Potassium: 3.3 mEq/L — ABNORMAL LOW (ref 3.5–5.1)

## 2011-04-19 MED ORDER — SODIUM CHLORIDE 0.9 % IV SOLN
INTRAVENOUS | Status: DC
  Administered 2011-04-19: 19:00:00 via INTRAVENOUS

## 2011-04-19 MED ORDER — MORPHINE SULFATE 4 MG/ML IJ SOLN
4.0000 mg | Freq: Once | INTRAMUSCULAR | Status: AC
  Administered 2011-04-19: 4 mg via INTRAVENOUS
  Filled 2011-04-19: qty 1

## 2011-04-19 MED ORDER — POTASSIUM CHLORIDE 10 MEQ/100ML IV SOLN
10.0000 meq | Freq: Once | INTRAVENOUS | Status: AC
  Administered 2011-04-19: 10 meq via INTRAVENOUS
  Filled 2011-04-19: qty 100

## 2011-04-19 MED ORDER — POTASSIUM CHLORIDE ER 10 MEQ PO TBCR: 20.0000 meq | EXTENDED_RELEASE_TABLET | Freq: Two times a day (BID) | ORAL | Status: DC

## 2011-04-19 MED ORDER — POTASSIUM CHLORIDE 20 MEQ/15ML (10%) PO LIQD
40.0000 meq | Freq: Once | ORAL | Status: AC
  Administered 2011-04-19: 40 meq via ORAL
  Filled 2011-04-19: qty 30

## 2011-04-19 MED ORDER — IOHEXOL 300 MG/ML  SOLN
100.0000 mL | Freq: Once | INTRAMUSCULAR | Status: AC | PRN
  Administered 2011-04-19: 100 mL via INTRAVENOUS

## 2011-04-19 MED ORDER — POTASSIUM CHLORIDE 10 MEQ/100ML IV SOLN
10.0000 meq | Freq: Once | INTRAVENOUS | Status: AC
  Administered 2011-04-19: 10 meq via INTRAVENOUS

## 2011-04-19 MED ORDER — TRAMADOL HCL 50 MG PO TABS: 50.0000 mg | ORAL_TABLET | Freq: Four times a day (QID) | ORAL | Status: AC | PRN

## 2011-04-19 NOTE — ED Notes (Signed)
Pt c/o left groin pain, requesting pain medication. Md aware.

## 2011-04-19 NOTE — ED Notes (Signed)
Pt d/c in stable condition. Ambulatory.

## 2011-04-19 NOTE — ED Notes (Signed)
Pt ready for discharge. Md has seen pt. Will give D/c instructions and scripts.

## 2011-04-19 NOTE — ED Notes (Signed)
Patient is resting comfortably. 

## 2011-04-19 NOTE — ED Notes (Signed)
CT has come back unremarkable. Patient has been given IV and oral potassium and repeat potassium is up to 3.3. At this point, patient is stable for discharge. She'll be given a prescription for tramadol for pain, and a prescription for potassium. She is to followup with her gastroenterologist, Dr. Arlyce Dice.  Results for orders placed during the hospital encounter of 04/19/11  CBC      Component Value Range   WBC 8.1  4.0 - 10.5 (K/uL)   RBC 5.20 (*) 3.87 - 5.11 (MIL/uL)   Hemoglobin 14.2  12.0 - 15.0 (g/dL)   HCT 29.5  62.1 - 30.8 (%)   MCV 80.4  78.0 - 100.0 (fL)   MCH 27.3  26.0 - 34.0 (pg)   MCHC 34.0  30.0 - 36.0 (g/dL)   RDW 65.7  84.6 - 96.2 (%)   Platelets 387  150 - 400 (K/uL)  DIFFERENTIAL      Component Value Range   Neutrophils Relative 45  43 - 77 (%)   Neutro Abs 3.6  1.7 - 7.7 (K/uL)   Lymphocytes Relative 47 (*) 12 - 46 (%)   Lymphs Abs 3.8  0.7 - 4.0 (K/uL)   Monocytes Relative 7  3 - 12 (%)   Monocytes Absolute 0.6  0.1 - 1.0 (K/uL)   Eosinophils Relative 1  0 - 5 (%)   Eosinophils Absolute 0.1  0.0 - 0.7 (K/uL)   Basophils Relative 1  0 - 1 (%)   Basophils Absolute 0.0  0.0 - 0.1 (K/uL)  COMPREHENSIVE METABOLIC PANEL      Component Value Range   Sodium 137  135 - 145 (mEq/L)   Potassium 2.5 (*) 3.5 - 5.1 (mEq/L)   Chloride 96  96 - 112 (mEq/L)   CO2 27  19 - 32 (mEq/L)   Glucose, Bld 200 (*) 70 - 99 (mg/dL)   BUN 14  6 - 23 (mg/dL)   Creatinine, Ser 9.52  0.50 - 1.10 (mg/dL)   Calcium 9.7  8.4 - 84.1 (mg/dL)   Total Protein 8.2  6.0 - 8.3 (g/dL)   Albumin 3.9  3.5 - 5.2 (g/dL)   AST 20  0 - 37 (U/L)   ALT 29  0 - 35 (U/L)   Alkaline Phosphatase 114  39 - 117 (U/L)   Total Bilirubin 0.2 (*) 0.3 - 1.2 (mg/dL)   GFR calc non Af Amer 71 (*) >90 (mL/min)   GFR calc Af Amer 82 (*) >90 (mL/min)  LIPASE, BLOOD      Component Value Range   Lipase 42  11 - 59 (U/L)  URINALYSIS, ROUTINE W REFLEX MICROSCOPIC      Component Value Range   Color, Urine YELLOW  YELLOW     APPearance CLEAR  CLEAR    Specific Gravity, Urine 1.035 (*) 1.005 - 1.030    pH 6.5  5.0 - 8.0    Glucose, UA NEGATIVE  NEGATIVE (mg/dL)   Hgb urine dipstick NEGATIVE  NEGATIVE    Bilirubin Urine NEGATIVE  NEGATIVE    Ketones, ur NEGATIVE  NEGATIVE (mg/dL)   Protein, ur NEGATIVE  NEGATIVE (mg/dL)   Urobilinogen, UA 0.2  0.0 - 1.0 (mg/dL)   Nitrite NEGATIVE  NEGATIVE    Leukocytes, UA NEGATIVE  NEGATIVE   POTASSIUM      Component Value Range   Potassium 3.3 (*) 3.5 - 5.1 (mEq/L)   Ct Abdomen Pelvis W Contrast  04/19/2011  *RADIOLOGY REPORT*  Clinical Data: Left lower quadrant  abdominal pain for 4 days. White count 8.1.  CT ABDOMEN AND PELVIS WITH CONTRAST  Technique:  Multidetector CT imaging of the abdomen and pelvis was performed following the standard protocol during bolus administration of intravenous contrast.  Contrast: OMNIPAQUE IOHEXOL 300 MG/ML IV SOLN  Comparison: 07/19/2009  Findings: The lung bases are unremarkable in appearance.  Small low density lesions within the liver are likely benign and appears stable.  No focal abnormality is identified within the spleen, pancreas, adrenal glands, or kidneys.  The gallbladder is present.  The stomach and small bowel loops have a normal appearance. The appendix is well seen and has a normal appearance.  Colonic loops are normal in caliber.  Multiple diverticula are present.  No associated inflammation.  There is atherosclerotic calcification of the abdominal aorta.  No aneurysm. No retroperitoneal or mesenteric adenopathy.  The uterus is absent.  No adnexal mass or free pelvic fluid.  There is moderate degenerative change in the lower lumbar spine, most notable at levels L2-3, L3-4, and L4-5.  Vacuum disc phenomenon, and disc height loss identified at these levels.  IMPRESSION:  1.  Diverticulosis without evidence for acute diverticulitis. 2.  Small probable hepatic cysts are stable. 3.  No evidence for acute appendicitis.  Original  Report Authenticated By: Patterson Hammersmith, M.D.      Dione Booze, MD 04/19/11 2255

## 2011-04-19 NOTE — ED Notes (Signed)
ZOX:[id number]<VW> Expected date:04/19/11<BR> Expected time: 4:01 PM<BR> Means of arrival:<BR> Comments:<BR> 66 yo Diarrhea

## 2011-04-19 NOTE — ED Notes (Signed)
Pt well, c/o K burning at IV site. Y'd saline into K. Pt now better and says medication isnt burning at this time. Will continue to assess.. Will repeat potassium level.

## 2011-04-19 NOTE — ED Notes (Signed)
Potassium stopped because pt stated that it was burning her veins too bad even after being diluted and slowed down. Approx. 1/4 of the bag may have gone in.

## 2011-04-19 NOTE — ED Provider Notes (Signed)
History     CSN: [id number]  Arrival date & time 04/19/11  1222   First MD Initiated Contact with Patient 04/19/11 1245      No chief complaint on file.   (Consider location/radiation/quality/duration/timing/severity/associated sxs/prior treatment) HPI Comments: Patient is a 66 year old woman who complains of pain in the left lower abdomen. This is been going on for about 4 days. She's had some vomiting and diarrhea with this she is also seeing some blood in her stool. She's had a recent GI workup with lower endoscopy as well as upper endoscopy.  Her endoscopies in September of 2012 were negative. He also complains of some chest pain. She had had a cardiac cath in March of 2012 which was negative.  Patient is a 66 y.o. female presenting with abdominal pain.  Abdominal Pain The primary symptoms of the illness include abdominal pain, nausea and vomiting. The primary symptoms of the illness do not include fever. The current episode started more than 2 days ago. The onset of the illness was gradual. The problem has been gradually worsening (She has a cramping left lower abdominal pain that makes it hard to straighten up when the pain hits her.).  The patient has not had a change in bowel habit. Risk factors for an acute abdominal problem include being elderly.    Past Medical History  Diagnosis Date  . DM (diabetes mellitus)   . HTN (hypertension)   . Hyperlipidemia   . Chronic back pain   . Asthma   . Irritable bowel syndrome (IBS)   . Low blood potassium   . Obese   . GERD (gastroesophageal reflux disease)   . Anal fissure   . Diverticulosis of colon (without mention of hemorrhage) 2009    Colonoscopy  . Hyperplastic colon polyp 2009    Colonoscopy  . Hemorrhoids   . Stroke     Past Surgical History  Procedure Date  . Partial hysterectomy     Family History  Problem Relation Age of Onset  . Colon cancer Neg Hx   . Prostate cancer Brother     History  Substance Use  Topics  . Smoking status: Current Everyday Smoker -- 0.2 packs/day  . Smokeless tobacco: Never Used  . Alcohol Use: 12.6 oz/week    21 Cans of beer per week     one beer daily or every other day     OB History    Grav Para Term Preterm Abortions TAB SAB Ect Mult Living                  Review of Systems  Constitutional: Negative.  Negative for fever.  Eyes: Negative.   Respiratory: Negative.   Cardiovascular: Positive for chest pain.       She rates her chest pain is has a 5. Pain comes and goes. It is worse with cough.  Gastrointestinal: Positive for nausea, vomiting and abdominal pain.  Genitourinary: Negative.   Musculoskeletal: Negative.   Skin: Negative.   Neurological: Negative.   Psychiatric/Behavioral: Negative.     Allergies  Codeine and Penicillins  Home Medications   Current Outpatient Rx  Name Route Sig Dispense Refill  . PROAIR HFA IN Inhalation Inhale 2 puffs into the lungs every 4 (four) hours.      . AMLODIPINE BESYLATE 10 MG PO TABS Oral Take 10 mg by mouth daily.      . CYCLOBENZAPRINE HCL 10 MG PO TABS Oral Take 10 mg by mouth at bedtime.      Marland Kitchen  GLIMEPIRIDE 4 MG PO TABS Oral Take 4 mg by mouth daily before breakfast.      . GLUCOSE BLOOD VI STRP Other 1 each by Other route as needed. Use as instructed     . HYDROCORTISONE 2.5 % RE CREA Rectal Place 1 application rectally daily. For treatment of anal fissure     . LACTULOSE 10 GM/15ML PO SOLN Oral Take 15 mLs by mouth daily.      . MELOXICAM 15 MG PO TABS Oral Take 15 mg by mouth daily.      Marland Kitchen METOCLOPRAMIDE HCL 10 MG PO TABS  Take 1 pill 30 min before meals and at bedtime. 120 tablet 1  . PANTOPRAZOLE SODIUM 40 MG PO TBEC  TAKE ONE TABLET BY MOUTH EVERY DAY 30 tablet 3  . POTASSIUM CHLORIDE 10 MEQ PO TBCR Oral Take 10 mEq by mouth daily.      Marland Kitchen PRAVASTATIN SODIUM 40 MG PO TABS Oral Take 40 mg by mouth daily.      Marland Kitchen PROMETHAZINE-DM 6.25-15 MG/5ML PO SYRP Oral Take by mouth. As needed and as directed        There were no vitals taken for this visit.  Physical Exam  Nursing note and vitals reviewed. Constitutional: She is oriented to person, place, and time. She appears well-developed and well-nourished.  HENT:  Head: Normocephalic and atraumatic.  Right Ear: External ear normal.  Left Ear: External ear normal.  Mouth/Throat: Oropharynx is clear and moist.  Eyes: Conjunctivae and EOM are normal. Pupils are equal, round, and reactive to light.  Neck: Normal range of motion. Neck supple.  Cardiovascular: Normal rate, regular rhythm and normal heart sounds.   Pulmonary/Chest: Effort normal.  Abdominal:       He localizes pain to the left lower abdomen. There is no mass or tenderness there.  Musculoskeletal: Normal range of motion.  Neurological: She is alert and oriented to person, place, and time.       No sensory or motor deficit.  Skin: Skin is warm and dry.  Psychiatric: She has a normal mood and affect. Her behavior is normal.    ED Course  Procedures (including critical care time)   12:46 PM  Date: 04/19/2011  Rate: 95  Rhythm: normal sinus rhythm  QRS Axis: normal  Intervals: PR prolonged  ST/T Wave abnormalities: normal  Conduction Disutrbances:none  Narrative Interpretation: Borderline EKG  Old EKG Reviewed: changes noted--QT prolongation seen on tracing of 06/14/10 is no longer present.  3:14 PM Potassium was low at 2.5.  Potassium chloride 40 meq po ordered.    4:40 PM Prolonged wait for abdominal and pelvic CT.  Results for orders placed during the hospital encounter of 04/19/11  CBC      Component Value Range   WBC 8.1  4.0 - 10.5 (K/uL)   RBC 5.20 (*) 3.87 - 5.11 (MIL/uL)   Hemoglobin 14.2  12.0 - 15.0 (g/dL)   HCT 16.1  09.6 - 04.5 (%)   MCV 80.4  78.0 - 100.0 (fL)   MCH 27.3  26.0 - 34.0 (pg)   MCHC 34.0  30.0 - 36.0 (g/dL)   RDW 40.9  81.1 - 91.4 (%)   Platelets 387  150 - 400 (K/uL)  DIFFERENTIAL      Component Value Range   Neutrophils  Relative 45  43 - 77 (%)   Neutro Abs 3.6  1.7 - 7.7 (K/uL)   Lymphocytes Relative 47 (*) 12 - 46 (%)   Lymphs  Abs 3.8  0.7 - 4.0 (K/uL)   Monocytes Relative 7  3 - 12 (%)   Monocytes Absolute 0.6  0.1 - 1.0 (K/uL)   Eosinophils Relative 1  0 - 5 (%)   Eosinophils Absolute 0.1  0.0 - 0.7 (K/uL)   Basophils Relative 1  0 - 1 (%)   Basophils Absolute 0.0  0.0 - 0.1 (K/uL)  COMPREHENSIVE METABOLIC PANEL      Component Value Range   Sodium 137  135 - 145 (mEq/L)   Potassium 2.5 (*) 3.5 - 5.1 (mEq/L)   Chloride 96  96 - 112 (mEq/L)   CO2 27  19 - 32 (mEq/L)   Glucose, Bld 200 (*) 70 - 99 (mg/dL)   BUN 14  6 - 23 (mg/dL)   Creatinine, Ser 1.61  0.50 - 1.10 (mg/dL)   Calcium 9.7  8.4 - 09.6 (mg/dL)   Total Protein 8.2  6.0 - 8.3 (g/dL)   Albumin 3.9  3.5 - 5.2 (g/dL)   AST 20  0 - 37 (U/L)   ALT 29  0 - 35 (U/L)   Alkaline Phosphatase 114  39 - 117 (U/L)   Total Bilirubin 0.2 (*) 0.3 - 1.2 (mg/dL)   GFR calc non Af Amer 71 (*) >90 (mL/min)   GFR calc Af Amer 82 (*) >90 (mL/min)  LIPASE, BLOOD      Component Value Range   Lipase 42  11 - 59 (U/L)  URINALYSIS, ROUTINE W REFLEX MICROSCOPIC      Component Value Range   Color, Urine YELLOW  YELLOW    APPearance CLEAR  CLEAR    Specific Gravity, Urine 1.035 (*) 1.005 - 1.030    pH 6.5  5.0 - 8.0    Glucose, UA NEGATIVE  NEGATIVE (mg/dL)   Hgb urine dipstick NEGATIVE  NEGATIVE    Bilirubin Urine NEGATIVE  NEGATIVE    Ketones, ur NEGATIVE  NEGATIVE (mg/dL)   Protein, ur NEGATIVE  NEGATIVE (mg/dL)   Urobilinogen, UA 0.2  0.0 - 1.0 (mg/dL)   Nitrite NEGATIVE  NEGATIVE    Leukocytes, UA NEGATIVE  NEGATIVE   URINE CULTURE      Component Value Range   Specimen Description URINE, CLEAN CATCH     Special Requests NONE     Setup Time [id number]     Colony Count NO GROWTH     Culture NO GROWTH     Report Status 04/21/2011 FINAL    POTASSIUM      Component Value Range   Potassium 3.3 (*) 3.5 - 5.1 (mEq/L)   Ct Abdomen Pelvis W  Contrast  04/19/2011  *RADIOLOGY REPORT*  Clinical Data: Left lower quadrant abdominal pain for 4 days. White count 8.1.  CT ABDOMEN AND PELVIS WITH CONTRAST  Technique:  Multidetector CT imaging of the abdomen and pelvis was performed following the standard protocol during bolus administration of intravenous contrast.  Contrast: OMNIPAQUE IOHEXOL 300 MG/ML IV SOLN  Comparison: 07/19/2009  Findings: The lung bases are unremarkable in appearance.  Small low density lesions within the liver are likely benign and appears stable.  No focal abnormality is identified within the spleen, pancreas, adrenal glands, or kidneys.  The gallbladder is present.  The stomach and small bowel loops have a normal appearance. The appendix is well seen and has a normal appearance.  Colonic loops are normal in caliber.  Multiple diverticula are present.  No associated inflammation.  There is atherosclerotic calcification of the abdominal aorta.  No aneurysm. No retroperitoneal or mesenteric adenopathy.  The uterus is absent.  No adnexal mass or free pelvic fluid.  There is moderate degenerative change in the lower lumbar spine, most notable at levels L2-3, L3-4, and L4-5.  Vacuum disc phenomenon, and disc height loss identified at these levels.  IMPRESSION:  1.  Diverticulosis without evidence for acute diverticulitis. 2.  Small probable hepatic cysts are stable. 3.  No evidence for acute appendicitis.  Original Report Authenticated By: Patterson Hammersmith, M.D.      1. Abdominal pain   2. Hypokalemia           Carleene Cooper III, MD 04/22/11 2111

## 2011-04-19 NOTE — ED Notes (Signed)
Pt states "having cp, vomiting blood and having pain in my groin"; pt indicates pain to left groin.

## 2011-04-19 NOTE — ED Notes (Signed)
Patient transported to CT 

## 2011-04-21 LAB — URINE CULTURE
Colony Count: NO GROWTH
Culture  Setup Time: 201301040210
Culture: NO GROWTH

## 2011-04-24 ENCOUNTER — Encounter: Payer: Self-pay | Admitting: Gastroenterology

## 2011-04-24 ENCOUNTER — Ambulatory Visit (INDEPENDENT_AMBULATORY_CARE_PROVIDER_SITE_OTHER): Payer: Medicare Other | Admitting: Gastroenterology

## 2011-04-24 VITALS — BP 144/80 | HR 76 | Ht 63.0 in | Wt 165.0 lb

## 2011-04-24 DIAGNOSIS — R1032 Left lower quadrant pain: Secondary | ICD-10-CM | POA: Insufficient documentation

## 2011-04-24 DIAGNOSIS — R112 Nausea with vomiting, unspecified: Secondary | ICD-10-CM

## 2011-04-24 MED ORDER — HYOSCYAMINE SULFATE ER 0.375 MG PO TBCR: EXTENDED_RELEASE_TABLET | ORAL | Status: DC

## 2011-04-24 MED ORDER — CEFUROXIME AXETIL 500 MG PO TABS: 500.0000 mg | ORAL_TABLET | Freq: Two times a day (BID) | ORAL | Status: AC

## 2011-04-24 MED ORDER — METOCLOPRAMIDE HCL 10 MG PO TABS: ORAL_TABLET | ORAL | Status: DC

## 2011-04-24 NOTE — Assessment & Plan Note (Signed)
Probable gastroparesis.  Medications #1 resume metoclopramide

## 2011-04-24 NOTE — Assessment & Plan Note (Addendum)
Despite negative CT findings I am suspicious that she has a low-grade diverticulitis.  Medications #1 begin cefuroxime 500mg  bid #2 hyomax

## 2011-04-24 NOTE — Patient Instructions (Signed)
Your medication will be sent to your pharmacy You will need to follow up in 3 weeks

## 2011-04-24 NOTE — Progress Notes (Addendum)
History of Present Illness:  Margaret Rangel has returned for evaluation of abdominal pain.  Approximately 5 days ago she developed sharp left lower quadrant pain. This was accompanied by bright red blood mixed with her stools. She was seen in the ER where a CT scan was unrevealing. White count was normal as was her hemoglobin. She was given pain medicines and discharged. She continues to complain of sharp left lower quadrant pain. She is without fevers. She has a history of diverticulosis.  Colonoscopy in September, 2012 demonstrated mild diverticulosis and small adenomatous polyp. She has recurrent nausea since running out of reglan. She did not undergo a repeat gastric emptying scan.    Review of Systems: Pertinent positive and negative review of systems were noted in the above HPI section. All other review of systems were otherwise negative.    Current Medications, Allergies, Past Medical History, Past Surgical History, Family History and Social History were reviewed in [facility] electronic medical record  Vital signs were reviewed in today's medical record. Physical Exam: General: Well developed , well nourished, no acute distress Head: Normocephalic and atraumatic Eyes:  sclerae anicteric, EOMI Ears: Normal auditory acuity Mouth: No deformity or lesions Lungs: Clear throughout to auscultation Heart: Regular rate and rhythm; no murmurs, rubs or bruits Abdomen: Soft, and non distended. No masses; there is mild tenderness to direct palpation in the left lower quadrant without guarding or rebound hepatosplenomegaly or hernias noted. Normal Bowel sounds Rectal:deferred Musculoskeletal: Symmetrical with no gross deformities  Pulses:  Normal pulses noted Extremities: No clubbing, cyanosis, edema or deformities noted Neurological: Alert oriented x 4, grossly nonfocal Psychological:  Alert and cooperative. Normal mood and affect

## 2011-05-04 ENCOUNTER — Ambulatory Visit: Payer: Medicare Other | Admitting: Gastroenterology

## 2011-05-08 ENCOUNTER — Telehealth: Payer: Self-pay | Admitting: Gastroenterology

## 2011-05-08 ENCOUNTER — Ambulatory Visit: Payer: Medicare Other | Admitting: Gastroenterology

## 2011-05-08 NOTE — Telephone Encounter (Signed)
No charge pt called and cancelled

## 2011-05-15 ENCOUNTER — Encounter: Payer: Self-pay | Admitting: Gastroenterology

## 2011-05-15 ENCOUNTER — Ambulatory Visit (INDEPENDENT_AMBULATORY_CARE_PROVIDER_SITE_OTHER): Payer: Medicare Other | Admitting: Gastroenterology

## 2011-05-15 VITALS — BP 140/64 | HR 104 | Ht 63.0 in | Wt 167.8 lb

## 2011-05-15 DIAGNOSIS — R1032 Left lower quadrant pain: Secondary | ICD-10-CM

## 2011-05-15 MED ORDER — METHYLPREDNISOLONE (PAK) 4 MG PO TABS: 4.0000 mg | ORAL_TABLET | Freq: Every day | ORAL | Status: AC

## 2011-05-15 NOTE — Assessment & Plan Note (Addendum)
Based on today's exam I believe that pain is secondary to abdominal  wall pain.  There is no obvious hernia.  Recommendations #1 because of the severity of her tenderness and symptoms I will obtain a stat second opinion with surgery. In the meantime I will start her on a medrol dosepak

## 2011-05-15 NOTE — Patient Instructions (Signed)
Your appointment with Dr Earlene Plater at [facility] is scheduled on 05/16/2011 at 2pm

## 2011-05-15 NOTE — Progress Notes (Signed)
History of Present Illness: [name] returns for followup of abdominal pain. Severe left lower quadrant pain continues. It hurts to sit up or to straighten up while walking. It worsens with a bowel movement.  CT scan from earlier this month was unrevealing. She received an empiric course of cefuroxime for possible diverticulitis without improvement. She is without fever.  Nausea is improved since starting Reglan.    Review of Systems: Pertinent positive and negative review of systems were noted in the above HPI section. All other review of systems were otherwise negative.    Current Medications, Allergies, Past Medical History, Past Surgical History, Family History and Social History were reviewed in [employer] [facility] electronic medical record  Vital signs were reviewed in today's medical record. Physical Exam: General: Well developed , well nourished, no acute distress On abdominal exam she is exquisitely tender to palpation in the left lower quadrant just inferior to the umbilicus. Tenderness clearly worsens with abdominal muscle wall flexion.  There is no guarding or rebound. There are no abdominal masses.

## 2011-05-16 ENCOUNTER — Ambulatory Visit (INDEPENDENT_AMBULATORY_CARE_PROVIDER_SITE_OTHER): Payer: Medicare Other | Admitting: Surgery

## 2011-05-16 ENCOUNTER — Encounter (INDEPENDENT_AMBULATORY_CARE_PROVIDER_SITE_OTHER): Payer: Self-pay | Admitting: Surgery

## 2011-05-16 DIAGNOSIS — R1032 Left lower quadrant pain: Secondary | ICD-10-CM

## 2011-05-16 NOTE — Progress Notes (Signed)
Patient ID: Margaret Rangel, female   DOB: 09/11/1945, 66 y.o.   MRN: [id number]  Chief Complaint  Patient presents with  . Other    Hernia & abdominal wall pain    HPI Margaret Rangel is a 66 y.o. female.   HPI This patient is referred by Dr. [name] for evaluation of left lower quadrant abdominal pain. She reports she has had sharp pain for approximately 2 months. It is a constant pain. She has no nausea or vomiting with this. She discussed. The pain came on gradually but again is now continuous and the first around to her back. She has had a recent CAT scan of the abdomen and pelvis which was unremarkable. Past Medical History  Diagnosis Date  . DM (diabetes mellitus)   . HTN (hypertension)   . Chronic back pain   . Asthma   . Irritable bowel syndrome (IBS)   . Low blood potassium   . Obese   . GERD (gastroesophageal reflux disease)   . Anal fissure   . Diverticulosis of colon (without mention of hemorrhage) 2009    Colonoscopy  . Hyperplastic colon polyp 2009    Colonoscopy  . Hemorrhoids   . Stroke   . Abdominal pain   . Hernia   . Arthritis   . Hyperlipidemia     low    Past Surgical History  Procedure Date  . Partial hysterectomy 1971    Family History  Problem Relation Age of Onset  . Colon cancer Neg Hx   . Prostate cancer Brother     Social History History  Substance Use Topics  . Smoking status: Current Everyday Smoker -- 0.3 packs/day  . Smokeless tobacco: Never Used  . Alcohol Use: 12.6 oz/week    21 Cans of beer per week     twice per week    Allergies  Allergen Reactions  . Codeine Itching  . Penicillins Itching    Current Outpatient Prescriptions  Medication Sig Dispense Refill  . Albuterol Sulfate (PROAIR HFA IN) Inhale 2 puffs into the lungs every 4 (four) hours.        Marland Kitchen amLODipine (NORVASC) 10 MG tablet Take 10 mg by mouth daily.        . chlorthalidone (HYGROTON) 25 MG tablet Take 25 mg by mouth daily.        .  cyclobenzaprine (FLEXERIL) 10 MG tablet Take 10 mg by mouth 2 (two) times daily.       Marland Kitchen glimepiride (AMARYL) 4 MG tablet Take 8 mg by mouth daily before breakfast.       . glucose blood test strip 1 each by Other route as needed. Use as instructed       . hydrocortisone (ANUSOL-HC) 2.5 % rectal cream Place 1 application rectally daily. For treatment of anal fissure       . Hyoscyamine Sulfate (HYOMAX-DT) 0.375 MG TBCR Date one tab twice a day for 4-5 days then as needed for abdominal pain  25 each  0  . ibuprofen (ADVIL,MOTRIN) 200 MG tablet Take 6-7 tablet by mouth every morning      . meloxicam (MOBIC) 15 MG tablet Take 15 mg by mouth daily.        . methylPREDNIsolone (MEDROL DOSPACK) 4 MG tablet Take 1 tablet (4 mg total) by mouth daily. follow package directions  21 tablet  0  . metoCLOPramide (REGLAN) 10 MG tablet Take 1 pill 30 min before meals and at bedtime.  120  tablet  1  . pantoprazole (PROTONIX) 40 MG tablet TAKE ONE TABLET BY MOUTH EVERY DAY  30 tablet  3  . potassium chloride (K-DUR) 10 MEQ tablet Take 2 tablets (20 mEq total) by mouth 2 (two) times daily.  20 tablet  0  . simvastatin (ZOCOR) 40 MG tablet Take 40 mg by mouth daily.          Review of Systems Review of Systems  Constitutional: Negative for fever, chills and unexpected weight change.  HENT: Negative for hearing loss, congestion, sore throat, trouble swallowing and voice change.   Eyes: Negative for visual disturbance.  Respiratory: Negative for cough and wheezing.   Cardiovascular: Negative for chest pain, palpitations and leg swelling.  Gastrointestinal: Positive for abdominal pain and constipation. Negative for nausea, vomiting, diarrhea, blood in stool, abdominal distention and anal bleeding.  Genitourinary: Negative for hematuria, vaginal bleeding and difficulty urinating.  Musculoskeletal: Negative for arthralgias.  Skin: Negative for rash and wound.  Neurological: Negative for seizures, syncope and  headaches.  Hematological: Negative for adenopathy. Does not bruise/bleed easily.  Psychiatric/Behavioral: Negative for confusion.    Blood pressure 156/72, pulse 76, temperature 97.8 F (36.6 C), temperature source Temporal, resp. rate 16, height 5\' 3"  (1.6 m), weight 164 lb 9.6 oz (74.662 kg).  Physical Exam Physical Exam  Constitutional: She is oriented to person, place, and time. She appears well-developed and well-nourished. No distress.  HENT:  Head: Normocephalic and atraumatic.  Right Ear: External ear normal.  Left Ear: External ear normal.  Nose: Nose normal.  Mouth/Throat: Oropharynx is clear and moist. No oropharyngeal exudate.  Eyes: Conjunctivae are normal. Pupils are equal, round, and reactive to light. No scleral icterus.  Neck: Normal range of motion. Neck supple. No tracheal deviation present. No thyromegaly present.  Cardiovascular: Normal rate, regular rhythm, normal heart sounds and intact distal pulses.   No murmur heard. Pulmonary/Chest: Effort normal and breath sounds normal. No respiratory distress. She has no wheezes.  Abdominal: Soft. Bowel sounds are normal. She exhibits no mass. There is tenderness.       On exam of the abdomen, she has a well-healed lower midline incision. Her area of tenderness was in the left lower quadrant. It is above the inguinal ligament. I cannot palpate a hernia defect or mass.  Musculoskeletal: Normal range of motion. She exhibits no edema and no tenderness.  Lymphadenopathy:    She has no cervical adenopathy.  Neurological: She is alert and oriented to person, place, and time.  Skin: Skin is warm and dry. No rash noted. She is not diaphoretic. No erythema.  Psychiatric: Her behavior is normal. Judgment normal.    Data Reviewed I have reviewed her CAT scan of the abdomen and pelvis as well as Dr. Marzetta Board notes  Assessment    Patient with left lower quadrant abdominal pain of uncertain etiology    Plan    Since she has  started on steroids, I'm going to see her back in 2 weeks to see if this will improve her symptoms. If it does not, I will either have to consider exploration of this area in the operating room to rule out a hernia versus repeat x-ray studies. Currently, she does not have an acute abdomen necessitating emergent surgery.       Malavika Lira A 05/16/2011, 2:31 PM

## 2011-05-17 ENCOUNTER — Telehealth: Payer: Self-pay

## 2011-05-17 NOTE — Telephone Encounter (Signed)
Message copied by Michele Mcalpine on Thu May 17, 2011  4:19 PM ------      Message from: [name] D      Created: Wed May 16, 2011 10:43 AM       Please call pt to see if abdominal pain is at all improved since starting a medrol dose pack

## 2011-05-17 NOTE — Telephone Encounter (Signed)
Spoke with pt and she states that she is feeling better since starting the prednisone.

## 2011-05-18 ENCOUNTER — Telehealth: Payer: Self-pay

## 2011-05-18 ENCOUNTER — Telehealth (INDEPENDENT_AMBULATORY_CARE_PROVIDER_SITE_OTHER): Payer: Self-pay

## 2011-05-18 NOTE — Telephone Encounter (Signed)
Spoke with pt yesterday and she stated that she was feeling better since starting the medrol dose pack. Pt states today she has been having the abdominal pain again. She has 2 tablets to take Sat and should finish the dose pack on Sunday. Dr. Arlyce Dice please advise.

## 2011-05-18 NOTE — Telephone Encounter (Signed)
Spoke with pt and she is aware.

## 2011-05-18 NOTE — Telephone Encounter (Signed)
Upon finishing the dose pack she can take Advil 3-4 tablets every 6 hours as needed. If she is feeling worse she should follow up with surgery on Monday

## 2011-05-18 NOTE — Telephone Encounter (Signed)
Pt calling in this am b/c she is still having the abdominal pain that she came to see Dr Magnus Ivan for on 05-16-11. The pt said she was constipated this am and she was still taking steroids that Dr Arlyce Dice had put her on before her appt with Dr Magnus Ivan. I advised pt that I would call Dr Magnus Ivan to run this by him.  Spoke to Dr Magnus Ivan about the pt still having abdominal pain and he advised pt needed to stay on the steroids and complete those. Dr Magnus Ivan said the pt needed to give the steroids time to see if they are going to help. I advised pt that she needed to get the milk of magnesia for her constipation. I advised the pt that she needed to give this more time and see Dr Magnus Ivan back in 2 weeks.

## 2011-05-29 ENCOUNTER — Telehealth (INDEPENDENT_AMBULATORY_CARE_PROVIDER_SITE_OTHER): Payer: Self-pay

## 2011-05-29 NOTE — Telephone Encounter (Signed)
Per Dr Magnus Ivan no pain medication take ibuprofen or tylenol and if the pain is that bad she needs to go to the ED

## 2011-05-29 NOTE — Telephone Encounter (Signed)
Patient called in complaining of abdominal pain. Pain is worse now than before, she don't even want to walk around because pain is so bad. She is requesting pain medicine. Please advise.Marland KitchenMarland Kitchen

## 2011-06-04 ENCOUNTER — Encounter (INDEPENDENT_AMBULATORY_CARE_PROVIDER_SITE_OTHER): Payer: Self-pay | Admitting: Surgery

## 2011-06-04 ENCOUNTER — Ambulatory Visit (INDEPENDENT_AMBULATORY_CARE_PROVIDER_SITE_OTHER): Payer: Medicaid Other | Admitting: Surgery

## 2011-06-04 VITALS — BP 127/84 | HR 88 | Temp 98.2°F | Resp 16 | Ht 63.0 in | Wt 162.6 lb

## 2011-06-04 DIAGNOSIS — R1032 Left lower quadrant pain: Secondary | ICD-10-CM

## 2011-06-04 NOTE — Progress Notes (Signed)
Subjective:     Patient ID: Margaret Rangel, female   DOB: Aug 06, 1945, 66 y.o.   MRN: [id number]  HPI  She is here for a followup visit regarding her left inguinal pain. She still describes it as a sharp and burning pain but reports that it is less. She has no objective symptoms. Review of Systems     Objective:   Physical Exam    On exam, the area of tenderness is below the inguinal ligament and lateral. There is no evidence of inguinal hernia or adenopathy Assessment:     Patient with left groin pain of uncertain etiology.    Plan:     Based on her CAT scan and her physical examination, I am convinced there is not a hernia in this area. I'm uncertain of the cause of her discomfort. Again it is lateral and there is no adenopathy. I did not recommend exploring this area in the operating room. She is improving so I would continue expectant management. I will see her as needed

## 2011-07-20 ENCOUNTER — Encounter (INDEPENDENT_AMBULATORY_CARE_PROVIDER_SITE_OTHER): Payer: Self-pay | Admitting: Surgery

## 2011-07-30 ENCOUNTER — Ambulatory Visit (INDEPENDENT_AMBULATORY_CARE_PROVIDER_SITE_OTHER): Payer: Medicare Other | Admitting: Surgery

## 2011-07-30 ENCOUNTER — Encounter (INDEPENDENT_AMBULATORY_CARE_PROVIDER_SITE_OTHER): Payer: Self-pay | Admitting: Surgery

## 2011-07-30 VITALS — BP 144/82 | HR 72 | Temp 97.4°F | Resp 16 | Ht 63.0 in | Wt 163.4 lb

## 2011-07-30 DIAGNOSIS — R1032 Left lower quadrant pain: Secondary | ICD-10-CM

## 2011-07-30 DIAGNOSIS — IMO0001 Reserved for inherently not codable concepts without codable children: Secondary | ICD-10-CM

## 2011-07-30 NOTE — Progress Notes (Signed)
Subjective:     Patient ID: Margaret Rangel, female   DOB: 04/12/1946, 66 y.o.   MRN: [id number]  HPI She is here presenting with intermittent left groin pain. She reports sometimes it'll hurt and she will not be able to walk. She denies having a bulge in that area. The pain is described as sharp and intermittent  Review of Systems     Objective:   Physical Exam    On exam, there is no tenderness in the groin and no palpable hernia Assessment:     Left groin pain of uncertain etiology    Plan:     Again, from a general surgical standpoint I cannot feel an obvious hernia in her CAT scan is negative. I have nothing further to offer from a general surgical standpoint. She does not we'll this area explored in the operating room. I will see her as needed

## 2012-04-01 ENCOUNTER — Emergency Department (INDEPENDENT_AMBULATORY_CARE_PROVIDER_SITE_OTHER)
Admission: EM | Admit: 2012-04-01 | Discharge: 2012-04-01 | Disposition: A | Discharge status: Home / Self Care | Payer: Medicare Other | Source: Home / Self Care

## 2012-04-01 ENCOUNTER — Encounter (HOSPITAL_COMMUNITY): Payer: Self-pay | Admitting: Emergency Medicine

## 2012-04-01 DIAGNOSIS — K625 Hemorrhage of anus and rectum: Secondary | ICD-10-CM

## 2012-04-01 DIAGNOSIS — R1032 Left lower quadrant pain: Secondary | ICD-10-CM

## 2012-04-01 DIAGNOSIS — G8929 Other chronic pain: Secondary | ICD-10-CM

## 2012-04-01 LAB — POCT URINALYSIS DIP (DEVICE)
Bilirubin Urine: NEGATIVE
Glucose, UA: NEGATIVE mg/dL
Hgb urine dipstick: NEGATIVE
Ketones, ur: NEGATIVE mg/dL
Leukocytes, UA: NEGATIVE
Nitrite: NEGATIVE
Protein, ur: NEGATIVE mg/dL
Specific Gravity, Urine: <=1.005 (ref 1.005–1.030)
Urobilinogen, UA: 0.2 mg/dL (ref 0.0–1.0)
pH: 5.5 (ref 5.0–8.0)

## 2012-04-01 LAB — OCCULT BLOOD, POC DEVICE: Fecal Occult Bld: POSITIVE — AB

## 2012-04-01 MED ORDER — HYDROCORTISONE 2.5 % RE CREA: TOPICAL_CREAM | RECTAL | Status: DC

## 2012-04-01 MED ORDER — DEXTROMETHORPHAN POLISTIREX 30 MG/5ML PO LQCR: 60.0000 mg | ORAL | Status: DC | PRN

## 2012-04-01 NOTE — ED Provider Notes (Signed)
History     CSN: [id number]  Arrival date & time 04/01/12  1043   None     Chief Complaint  Patient presents with  . Abdominal Pain    (Consider location/radiation/quality/duration/timing/severity/associated sxs/prior treatment) HPI Comments: 66 year old female with left lower quadrant/inguinal pain that is constant for approximately one year. She states it is worse with constipation but is present on a daily basis and nothing seems to make it better. She has seen blood in her stool over the past 24 hours. She states that her symptoms are essentially unchanged since the onset approximately one year ago. This includes today's pain as well. States that she was just hoping that maybe we could do something for her pain that others could not. Review of her past medical history demonstrates that she has been evaluated by gastroenterology, her PCP and a surgeon. She has also had CT scans of the abdomen which have revealed no acute processes or etiology for her pain.   Past Medical History  Diagnosis Date  . DM (diabetes mellitus)   . HTN (hypertension)   . Chronic back pain   . Asthma   . Irritable bowel syndrome (IBS)   . Low blood potassium   . Obese   . GERD (gastroesophageal reflux disease)   . Anal fissure   . Diverticulosis of colon (without mention of hemorrhage) 2009    Colonoscopy  . Hyperplastic colon polyp 2009    Colonoscopy  . Hemorrhoids   . Stroke   . Abdominal pain   . Hernia   . Arthritis   . Hyperlipidemia     low    Past Surgical History  Procedure Date  . Partial hysterectomy 1971    Family History  Problem Relation Age of Onset  . Colon cancer Neg Hx   . Prostate cancer Brother     History  Substance Use Topics  . Smoking status: Current Every Day Smoker -- 0.3 packs/day  . Smokeless tobacco: Never Used  . Alcohol Use: 12.6 oz/week    21 Cans of beer per week     Comment: twice per week    OB History    Grav Para Term Preterm Abortions  TAB SAB Ect Mult Living                  Review of Systems  Constitutional: Negative for fever, chills and activity change.  Respiratory: Negative.   Cardiovascular: Negative.   Gastrointestinal: Positive for abdominal pain, constipation and blood in stool. Negative for nausea, vomiting and diarrhea.       As per history of present illness  Genitourinary: Negative.   Skin: Negative.     Allergies  Codeine and Penicillins  Home Medications   Current Outpatient Rx  Name  Route  Sig  Dispense  Refill  . PROAIR HFA IN   Inhalation   Inhale 2 puffs into the lungs every 4 (four) hours.           . AMLODIPINE BESYLATE 10 MG PO TABS   Oral   Take 10 mg by mouth daily.           . CHLORTHALIDONE 25 MG PO TABS   Oral   Take 25 mg by mouth daily.           . CYCLOBENZAPRINE HCL 10 MG PO TABS   Oral   Take 10 mg by mouth 2 (two) times daily.          Marland Kitchen DEXTROMETHORPHAN  POLISTIREX ER 30 MG/5ML PO LQCR   Oral   Take 10 mLs (60 mg total) by mouth as needed for cough.   89 mL   0   . GLIMEPIRIDE 4 MG PO TABS   Oral   Take 8 mg by mouth daily before breakfast.          . GLUCOSE BLOOD VI STRP   Other   1 each by Other route as needed. Use as instructed          . HYDROCORTISONE 2.5 % RE CREA   Rectal   Place 1 application rectally daily. For treatment of anal fissure          . HYDROCORTISONE 2.5 % RE CREA      Apply rectally 2 times daily   30 g   0   . HYOSCYAMINE SULFATE ER 0.375 MG PO TBCR      Date one tab twice a day for 4-5 days then as needed for abdominal pain   25 each   0   . IBUPROFEN 200 MG PO TABS      Take 6-7 tablet by mouth every morning         . MELOXICAM 15 MG PO TABS   Oral   Take 15 mg by mouth daily.           . METHYLPREDNISOLONE (PAK) 4 MG PO TABS   Oral   Take 1 tablet (4 mg total) by mouth daily. follow package directions   21 tablet   0   . METOCLOPRAMIDE HCL 10 MG PO TABS      Take 1 pill 30 min before  meals and at bedtime.   120 tablet   1   . PANTOPRAZOLE SODIUM 40 MG PO TBEC      TAKE ONE TABLET BY MOUTH EVERY DAY   30 tablet   3   . POTASSIUM CHLORIDE ER 10 MEQ PO TBCR   Oral   Take 2 tablets (20 mEq total) by mouth 2 (two) times daily.   20 tablet   0   . SIMVASTATIN 40 MG PO TABS   Oral   Take 40 mg by mouth daily.             BP 139/54  Pulse 93  Temp 98.3 F (36.8 C) (Oral)  Resp 16  SpO2 96%  Physical Exam  Constitutional: She is oriented to person, place, and time. She appears well-developed and well-nourished. No distress.  HENT:  Right Ear: External ear normal.  Left Ear: External ear normal.  Mouth/Throat: Oropharynx is clear and moist.  Eyes: EOM are normal.  Neck: Normal range of motion. Neck supple.  Cardiovascular: Normal rate, regular rhythm and normal heart sounds.   Pulmonary/Chest: Effort normal. No respiratory distress. She has no rales.       Distant bilateral and expiratory wheezes  Abdominal: She exhibits no distension.       Marked tenderness in the left lower and upper quadrants. Palpation of the left lower quadrant produces moderate to severe pain radiating through to the left back. Unable to palpate the spleen. No right hemi- abdominal tenderness. Bowel sounds are normal active  Genitourinary:       DR E. exam reveals mild tenderness of the anal ring. No stool palpated in the rectal vault. Guaiac is positive for blood.  Lymphadenopathy:    She has no cervical adenopathy.  Neurological: She is alert and oriented to person, place, and time.  She exhibits normal muscle tone.  Skin: Skin is warm and dry.  Psychiatric: She has a normal mood and affect.    ED Course  Procedures (including critical care time)   Labs Reviewed  POCT URINALYSIS DIP (DEVICE)   No results found.   1. Abdominal pain, chronic, left lower quadrant   2. Rectal bleeding       MDM  Some of the old charts were reviewed in regards to her abdominal pain.  She has had an extensive workup from GI point of view as well as surgical and her PCP. I found no documentation of a clear etiology. She states that her pain is constant as above but she is more tender today and she has seen bleeding with her stools. I have recommended that she go to the emergency department now and that we have no other resources for additional evaluation in the urgent care. She understands that although she has had bleeding previously and has been evaluated that her bleeding may represent something of a different nature or seriousness today. Her tenderness is also of concern. Again she has been recommended to go to the emergency department but declines to do so at this point stating that she has a appointment with her foot doctor. She was additionally advised that she should followup with her PCP soon as possible. SHe should  go to emergency department promptly for her abdominal symptoms especially if there is any worsening or bleeding. She states that she understands the above but must leave to go to her podiatrist appointment. She say she does have an appointment with her PCP but it is in several days.        Hayden Rasmussen, NP 04/01/12 1301

## 2012-04-01 NOTE — ED Notes (Signed)
Reports left lower abdominal pain for one year.  Reports seeing kaplan and [facility]mon for this issue and they could not find a source.  Patient reports this pain hurts every day.  Sometimes improves with bowel movement.  Reports last bm was this am and was normal per patient.  Denies urinating issues.  Reports bright red blood in stool that was noticed last night.

## 2012-04-01 NOTE — ED Notes (Signed)
Patient in bathroom

## 2012-04-01 NOTE — ED Provider Notes (Signed)
Medical screening examination/treatment/procedure(s) were performed by non-physician practitioner and as supervising physician I was immediately available for consultation/collaboration.  Raynald Blend, MD 04/01/12 1355

## 2012-07-04 ENCOUNTER — Encounter: Payer: Self-pay | Admitting: Gastroenterology

## 2012-07-04 ENCOUNTER — Ambulatory Visit (INDEPENDENT_AMBULATORY_CARE_PROVIDER_SITE_OTHER): Payer: Medicare Other | Admitting: Gastroenterology

## 2012-07-04 VITALS — BP 130/60 | HR 91 | Ht 63.0 in | Wt 171.0 lb

## 2012-07-04 DIAGNOSIS — R1032 Left lower quadrant pain: Secondary | ICD-10-CM

## 2012-07-04 DIAGNOSIS — K625 Hemorrhage of anus and rectum: Secondary | ICD-10-CM

## 2012-07-04 NOTE — Patient Instructions (Addendum)
You have been scheduled for a flexible sigmoidoscopy. Please follow the written instructions given to you at your visit today. If you use inhalers (even only as needed), please bring them with you on the day of your procedure.  

## 2012-07-04 NOTE — Assessment & Plan Note (Signed)
Pain and tenderness are likely due to musculoskeletal pain.

## 2012-07-04 NOTE — Progress Notes (Signed)
History of Present Illness:   Margaret Rangel has returned for evaluation of rectal bleeding. Since her last visit she claims that she's been having continuous painless rectal bleeding consisting of blood in the toilet  water and on the tissue. She's had to wear a pad because of bleeding. Colonoscopy in 2012 demonstrated an adenomatous polyp and internal hemorrhoids. She's been using topicals with insertion into the rectum without improvement. She continues to complain of left lower quadrant pain in proximity to the groin. Workup including CT scan was negative. It was felt to be due to musculoskeletal pain. There is no obvious hernia.    Review of Systems: Pertinent positive and negative review of systems were noted in the above HPI section. All other review of systems were otherwise negative.    Current Medications, Allergies, Past Medical History, Past Surgical History, Family History and Social History were reviewed in [facility] electronic medical record  Vital signs were reviewed in today's medical record. Physical Exam: General: Well developed , well nourished, no acute distress Skin: anicteric Head: Normocephalic and atraumatic Eyes:  sclerae anicteric, EOMI Ears: Normal auditory acuity Mouth: No deformity or lesions Lungs: Clear throughout to auscultation Heart: Regular rate and rhythm; no murmurs, rubs or bruits Abdomen: Soft,  and non distended. No masses, hepatosplenomegaly or hernias noted. Normal Bowel sounds. There is tenderness in the left lower quadrant just medial to the left groin. There are no obvious hernias or masses.  Rectal: There are no external abnormalities  Musculoskeletal: Symmetrical with no gross deformities  Pulses:  Normal pulses noted Extremities: No clubbing, cyanosis, edema or deformities noted Neurological: Alert oriented x 4, grossly nonfocal Psychological:  Alert and cooperative. Normal mood and affect    

## 2012-07-04 NOTE — Assessment & Plan Note (Signed)
Painless rectal bleeding very likely is 2 to hemorrhoids. She has failed medical therapy.  Therapeutic options including hemorrhoidal banding and surgical ligation were discussed.  Patient prefers to try nonsurgical approach.  Recommendations #1 sigmoidoscopy with band ligation.

## 2012-07-14 ENCOUNTER — Ambulatory Visit (HOSPITAL_COMMUNITY)
Admission: RE | Admit: 2012-07-14 | Discharge: 2012-07-14 | Disposition: A | Discharge status: Home / Self Care | Payer: Medicare Other | Source: Ambulatory Visit | Attending: Gastroenterology | Admitting: Gastroenterology

## 2012-07-14 ENCOUNTER — Encounter (HOSPITAL_COMMUNITY): Payer: Self-pay | Admitting: *Deleted

## 2012-07-14 ENCOUNTER — Encounter (HOSPITAL_COMMUNITY)
Admission: RE | Disposition: A | Discharge status: Home / Self Care | Payer: Self-pay | Source: Ambulatory Visit | Attending: Gastroenterology

## 2012-07-14 DIAGNOSIS — K648 Other hemorrhoids: Secondary | ICD-10-CM

## 2012-07-14 DIAGNOSIS — Z860100 Personal history of colon polyps, unspecified: Secondary | ICD-10-CM | POA: Insufficient documentation

## 2012-07-14 DIAGNOSIS — K625 Hemorrhage of anus and rectum: Secondary | ICD-10-CM | POA: Insufficient documentation

## 2012-07-14 DIAGNOSIS — R1032 Left lower quadrant pain: Secondary | ICD-10-CM

## 2012-07-14 HISTORY — PX: HEMORRHOID BANDING: SHX5850

## 2012-07-14 HISTORY — PX: FLEXIBLE SIGMOIDOSCOPY: SHX5431

## 2012-07-14 LAB — GLUCOSE, CAPILLARY: Glucose-Capillary: 100 mg/dL — ABNORMAL HIGH (ref 70–99)

## 2012-07-14 SURGERY — SIGMOIDOSCOPY, FLEXIBLE
Anesthesia: Moderate Sedation

## 2012-07-14 MED ORDER — FENTANYL CITRATE 0.05 MG/ML IJ SOLN
INTRAMUSCULAR | Status: AC
  Filled 2012-07-14: qty 4

## 2012-07-14 MED ORDER — DIPHENHYDRAMINE HCL 50 MG/ML IJ SOLN
INTRAMUSCULAR | Status: AC
  Filled 2012-07-14: qty 1

## 2012-07-14 MED ORDER — MIDAZOLAM HCL 10 MG/2ML IJ SOLN
INTRAMUSCULAR | Status: AC
  Filled 2012-07-14: qty 4

## 2012-07-14 MED ORDER — FENTANYL CITRATE 0.05 MG/ML IJ SOLN
INTRAMUSCULAR | Status: DC | PRN
  Administered 2012-07-14 (×4): 25 ug via INTRAVENOUS

## 2012-07-14 MED ORDER — MIDAZOLAM HCL 10 MG/2ML IJ SOLN
INTRAMUSCULAR | Status: DC | PRN
  Administered 2012-07-14 (×2): 2.5 mg via INTRAVENOUS
  Administered 2012-07-14: 2 mg via INTRAVENOUS
  Administered 2012-07-14: 2.5 mg via INTRAVENOUS

## 2012-07-14 MED ORDER — SODIUM CHLORIDE 0.9 % IV SOLN
INTRAVENOUS | Status: DC
  Administered 2012-07-14: 12:00:00 via INTRAVENOUS
  Administered 2012-07-14: 500 mL via INTRAVENOUS

## 2012-07-14 NOTE — H&P (View-Only) (Signed)
History of Present Illness:   Margaret Rangel has returned for evaluation of rectal bleeding. Since her last visit she claims that she's been having continuous painless rectal bleeding consisting of blood in the toilet  water and on the tissue. She's had to wear a pad because of bleeding. Colonoscopy in 2012 demonstrated an adenomatous polyp and internal hemorrhoids. She's been using topicals with insertion into the rectum without improvement. She continues to complain of left lower quadrant pain in proximity to the groin. Workup including CT scan was negative. It was felt to be due to musculoskeletal pain. There is no obvious hernia.    Review of Systems: Pertinent positive and negative review of systems were noted in the above HPI section. All other review of systems were otherwise negative.    Current Medications, Allergies, Past Medical History, Past Surgical History, Family History and Social History were reviewed in [employer] electronic medical record  Vital signs were reviewed in today's medical record. Physical Exam: General: Well developed , well nourished, no acute distress Skin: anicteric Head: Normocephalic and atraumatic Eyes:  sclerae anicteric, EOMI Ears: Normal auditory acuity Mouth: No deformity or lesions Lungs: Clear throughout to auscultation Heart: Regular rate and rhythm; no murmurs, rubs or bruits Abdomen: Soft,  and non distended. No masses, hepatosplenomegaly or hernias noted. Normal Bowel sounds. There is tenderness in the left lower quadrant just medial to the left groin. There are no obvious hernias or masses.  Rectal: There are no external abnormalities  Musculoskeletal: Symmetrical with no gross deformities  Pulses:  Normal pulses noted Extremities: No clubbing, cyanosis, edema or deformities noted Neurological: Alert oriented x 4, grossly nonfocal Psychological:  Alert and cooperative. Normal mood and affect

## 2012-07-14 NOTE — Op Note (Signed)
[facility] [address] [place], [address]   FLEXIBLE SIGMOIDOSCOPY PROCEDURE REPORT  PATIENT: Margaret, Rangel  MR#: [id number] BIRTHDATE: 03/31/1946 , 66  yrs. old GENDER: Female ENDOSCOPIST: Louis Meckel, MD REFERRED BY: Juline Patch, M.D. PROCEDURE DATE:  07/14/2012 PROCEDURE:   Hemorrhoidectomy via banding, clips or ligation ASA CLASS:   Class II INDICATIONS:therapy of for previously diagnosed hemorrhoids. MEDICATIONS: These medications were titrated to patient response per physician's verbal order, Versed, Versed-Detailed 8.5 mcg IV, and Fentanyl 100 mcg IV  DESCRIPTION OF PROCEDURE:   After the risks benefits and alternatives of the procedure were thoroughly explained, informed consent was obtained.  revealed no abnormalities of the rectum. The endoscope was introduced through the anus  and advanced to the sigmoid colon , limited by No adverse events experienced.   The quality of the prep was    .  The instrument was then slowly withdrawn as the mucosa was fully examined.       1.  COLON FINDINGS: 2 prominent hemorrhoidal uncles were identified.  3 bands were placedwith the endoscope in the retroflexed position, encompassing the 2 bundles. The remainder of the exam was normal. 2.  2 prominent hemorrhoidal uncles were identified.  3 bands were placedwith the endoscope in the retroflexed position, encompassing the 2 bundles. The remainder of the exam was normal. The scope was then withdrawn from the patient and the procedure terminated.  COMPLICATIONS: There were no complications.  ENDOSCOPIC IMPRESSION: internal hemorrhoids-status post band ligation  RECOMMENDATIONS: Office visit one  REPEAT EXAM:   _______________________________ eSignedLouis Meckel, MD 07/14/2012 12:26 PM   CC:

## 2012-07-14 NOTE — Interval H&P Note (Signed)
History and Physical Interval Note:  07/14/2012 12:14 PM  Margaret Rangel  has presented today for surgery, with the diagnosis of hemorrhoids  The various methods of treatment have been discussed with the patient and family. After consideration of risks, benefits and other options for treatment, the patient has consented to  Procedure(s): FLEXIBLE SIGMOIDOSCOPY (N/A) HEMORRHOID BANDING (N/A) as a surgical intervention .  The patient's history has been reviewed, patient examined, no change in status, stable for surgery.  I have reviewed the patient's chart and labs.  Questions were answered to the patient's satisfaction.     The recent H&P (dated *07/04/12**) was reviewed, the patient was examined and there is no change in the patients condition since that H&P was completed.   [name]  07/14/2012, 12:14 PM   [name]

## 2012-07-15 ENCOUNTER — Encounter (HOSPITAL_COMMUNITY): Payer: Self-pay | Admitting: Gastroenterology

## 2012-07-24 ENCOUNTER — Telehealth: Payer: Self-pay | Admitting: Gastroenterology

## 2012-07-24 NOTE — Telephone Encounter (Signed)
Pt states she was able to get a PCP and is being seen at 12noon today, she will get the prescription for glucometer at that visit.

## 2012-07-29 ENCOUNTER — Telehealth: Payer: Self-pay | Admitting: Gastroenterology

## 2012-07-29 ENCOUNTER — Encounter: Payer: Self-pay | Admitting: *Deleted

## 2012-07-29 NOTE — Telephone Encounter (Signed)
ok 

## 2012-07-29 NOTE — Telephone Encounter (Signed)
Patient had been better since the banding on 07/14/12. Yesterday, she started having pain with bowel movements and when she wipes. States her bowel movements are watery, light brown. She is having 3-4 watery stools/day. The rectum is burning. She is asking to be seen earlier than her 08/27/12 and for something for the pain. Offered OV today but she cannot come.Scheduled with Willette Cluster, NP on 07/30/12 at 2:00 PM.

## 2012-07-30 ENCOUNTER — Encounter: Payer: Self-pay | Admitting: Nurse Practitioner

## 2012-07-30 ENCOUNTER — Ambulatory Visit (INDEPENDENT_AMBULATORY_CARE_PROVIDER_SITE_OTHER): Payer: Medicare Other | Admitting: Nurse Practitioner

## 2012-07-30 VITALS — BP 130/72 | HR 68 | Ht 63.0 in | Wt 169.0 lb

## 2012-07-30 DIAGNOSIS — R1032 Left lower quadrant pain: Secondary | ICD-10-CM

## 2012-07-30 MED ORDER — SULINDAC 200 MG PO TABS: 200.0000 mg | ORAL_TABLET | Freq: Two times a day (BID) | ORAL | Status: AC

## 2012-07-30 NOTE — Patient Instructions (Addendum)
We sent a prescription for Clinoril ( Sulindac) , [address] [place], [place], [place]. Call and make appointment with Dr. Arlyce Dice if you don't notice any improvement.

## 2012-07-30 NOTE — Progress Notes (Signed)
07/30/2012 Margaret Rangel [id number] Jan 02, 1946   History of Present Illness:   Patient is a 67 year old female known to Dr. Arlyce Dice. She has a history of chronic LLQ pain. She had hemorrhoidal banding for bleeding on 06/16/12. No further bleeding. Patient worked in today for recurrent lower abdominal pain.  Patient thinks pain is a little different than what she has been seeing Dr. Arlyce Dice for.  She is taking Bentyl BID. No urinary symptoms. No fevers. BMs are at baseline  Patient has GERD but is asymptomatic on BID PPI.   Current Medications, Allergies, Past Medical History, Past Surgical History, Family History and Social History were reviewed in [employer] record.   Physical Exam: General: Well developed , black female in no acute distress Head: Normocephalic and atraumatic Eyes:  sclerae anicteric, conjunctiva pink  Ears: Normal auditory acuity Lungs: Clear throughout to auscultation Heart: Regular rate and rhythm. Murmur present Abdomen: Soft, non distended, moderate LLQ tenderness, +Carnett's sign.  No masses, no hepatomegaly. Normal bowel sounds Musculoskeletal: Symmetrical with no gross deformities  Extremities: No edema  Neurological: Alert oriented x 4, grossly nonfocal Psychological:  Alert and cooperative. Normal mood and affect  Assessment and Recommendations: 1. Acute on chronic LLQ abdominal pain. Pain worse when engaging abdominal muscles on exam. Patient had a negative surgical workup for this pain by Dr. Magnus Ivan. Will try Clinoril BID BID x 10 days. She will call back if pain persists.   2.Tobacco abuse  3. ETOH use. She smelled of ETOH. States she had a large beer today to kill the pain.

## 2012-07-31 NOTE — Progress Notes (Signed)
Reviewed and agree with management. Mardell Cragg D. Rylyn Zawistowski, M.D., FACG  

## 2012-08-07 ENCOUNTER — Telehealth: Payer: Self-pay | Admitting: Gastroenterology

## 2012-08-07 NOTE — Telephone Encounter (Signed)
Spoke with pt and discussed with her that she was not prescribed steroids. Pt was given Sulindac, she states this has not helped with the abdominal pain she has been having. Per Willette Cluster NP OV note if pt is still having problems with the discomfort she should follow-up with Dr. Arlyce Dice. Pt already has OV scheduled with Dr. Arlyce Dice in May. Pt knows to keep this OV appt.

## 2012-08-21 ENCOUNTER — Other Ambulatory Visit: Payer: Self-pay | Admitting: Internal Medicine

## 2012-08-21 DIAGNOSIS — Z1231 Encounter for screening mammogram for malignant neoplasm of breast: Secondary | ICD-10-CM

## 2012-08-27 ENCOUNTER — Ambulatory Visit (INDEPENDENT_AMBULATORY_CARE_PROVIDER_SITE_OTHER): Payer: Medicare Other | Admitting: Gastroenterology

## 2012-08-27 ENCOUNTER — Encounter: Payer: Self-pay | Admitting: Gastroenterology

## 2012-08-27 VITALS — BP 150/60 | HR 88 | Ht 62.6 in | Wt 167.2 lb

## 2012-08-27 DIAGNOSIS — K648 Other hemorrhoids: Secondary | ICD-10-CM

## 2012-08-27 DIAGNOSIS — R1032 Left lower quadrant pain: Secondary | ICD-10-CM

## 2012-08-27 NOTE — Assessment & Plan Note (Signed)
Significantly improved following band ligation.

## 2012-08-27 NOTE — Assessment & Plan Note (Signed)
Persistent pain not improved with NSAIDs.  Last colonoscopy 2012.  Still could be musculoskeletal pain.  Colon lesion less likely.  Plan - f. Sig; if negative would consider medrol dose pak

## 2012-08-27 NOTE — Progress Notes (Signed)
History of Present Illness:  Margaret Rangel has returned following band ligation of hemorrhoids.  Except for minimal streaking when she strains, she has had almost no further bleeding.  She is now c/o LLQ pain (not groin pain) that worsens with a bowel movement.  Pain is not positional.    Review of Systems: Pertinent positive and negative review of systems were noted in the above HPI section. All other review of systems were otherwise negative.    Current Medications, Allergies, Past Medical History, Past Surgical History, Family History and Social History were reviewed in [employer] electronic medical record  Vital signs were reviewed in today's medical record. Physical Exam: General: Well developed , well nourished, no acute distress On abd exam there is mild LLQ tenderness that is not affected by abdominal wall flexion

## 2012-08-27 NOTE — Patient Instructions (Addendum)
You have been given a separate informational sheet regarding your tobacco use, the importance of quitting and local resources to help you quit. You have been scheduled for a flexible sigmoidoscopy. Please follow the written instructions given to you at your visit today. If you use inhalers (even only as needed), please bring them with you on the day of your procedure.

## 2012-08-28 ENCOUNTER — Encounter: Payer: Self-pay | Admitting: Gastroenterology

## 2012-08-28 ENCOUNTER — Other Ambulatory Visit: Payer: Self-pay | Admitting: Gastroenterology

## 2012-08-28 ENCOUNTER — Ambulatory Visit (AMBULATORY_SURGERY_CENTER): Payer: Medicaid Other | Admitting: Gastroenterology

## 2012-08-28 VITALS — BP 122/72 | HR 80 | Temp 97.0°F | Resp 13 | Ht 62.0 in | Wt 167.0 lb

## 2012-08-28 DIAGNOSIS — R1032 Left lower quadrant pain: Secondary | ICD-10-CM

## 2012-08-28 DIAGNOSIS — K5730 Diverticulosis of large intestine without perforation or abscess without bleeding: Secondary | ICD-10-CM

## 2012-08-28 LAB — GLUCOSE, CAPILLARY
Glucose-Capillary: 126 mg/dL — ABNORMAL HIGH (ref 70–99)
Glucose-Capillary: 91 mg/dL (ref 70–99)

## 2012-08-28 MED ORDER — METHYLPREDNISOLONE 4 MG PO KIT: PACK | ORAL | Status: DC

## 2012-08-28 MED ORDER — SODIUM CHLORIDE 0.9 % IV SOLN: 500.0000 mL | INTRAVENOUS | Status: DC

## 2012-08-28 NOTE — Progress Notes (Signed)
Patient did not experience any of the following events: a burn prior to discharge; a fall within the facility; wrong site/side/patient/procedure/implant event; or a hospital transfer or hospital admission upon discharge from the facility. (G8907) Patient did not have preoperative order for IV antibiotic SSI prophylaxis. (G8918)  

## 2012-08-28 NOTE — Patient Instructions (Addendum)
YOU HAD AN ENDOSCOPIC PROCEDURE TODAY AT THE [facility]: Refer to the procedure report that was given to you for any specific questions about what was found during the examination.  If the procedure report does not answer your questions, please call your gastroenterologist to clarify.  If you requested that your care partner not be given the details of your procedure findings, then the procedure report has been included in a sealed envelope for you to review at your convenience later.  YOU SHOULD EXPECT: Some feelings of bloating in the abdomen. Passage of more gas than usual.  Walking can help get rid of the air that was put into your GI tract during the procedure and reduce the bloating. If you had a lower endoscopy (such as a colonoscopy or flexible sigmoidoscopy) you may notice spotting of blood in your stool or on the toilet paper. If you underwent a bowel prep for your procedure, then you may not have a normal bowel movement for a few days.  DIET: Your first meal following the procedure should be a light meal and then it is ok to progress to your normal diet.  A half-sandwich or bowl of soup is an example of a good first meal.  Heavy or fried foods are harder to digest and may make you feel nauseous or bloated.  Likewise meals heavy in dairy and vegetables can cause extra gas to form and this can also increase the bloating.  Drink plenty of fluids but you should avoid alcoholic beverages for 24 hours.  ACTIVITY: Your care partner should take you home directly after the procedure.  You should plan to take it easy, moving slowly for the rest of the day.  You can resume normal activity the day after the procedure however you should NOT DRIVE or use heavy machinery for 24 hours (because of the sedation medicines used during the test).    SYMPTOMS TO REPORT IMMEDIATELY: A gastroenterologist can be reached at any hour.  During normal business hours, 8:30 AM to 5:00 PM Monday through Friday,  call [phone].  After hours and on weekends, please call the GI answering service at [phone] who will take a message and have the physician on call contact you.   Following lower endoscopy (colonoscopy or flexible sigmoidoscopy):  Excessive amounts of blood in the stool  Significant tenderness or worsening of abdominal pains  Swelling of the abdomen that is new, acute  Fever of 100F or higher   FOLLOW UP: If any biopsies were taken you will be contacted by phone or by letter within the next 1-3 weeks.  Call your gastroenterologist if you have not heard about the biopsies in 3 weeks.  Our staff will call the home number listed on your records the next business day following your procedure to check on you and address any questions or concerns that you may have at that time regarding the information given to you following your procedure. This is a courtesy call and so if there is no answer at the home number and we have not heard from you through the emergency physician on call, we will assume that you have returned to your regular daily activities without incident.  SIGNATURES/CONFIDENTIALITY: You and/or your care partner have signed paperwork which will be entered into your electronic medical record.  These signatures attest to the fact that that the information above on your After Visit Summary has been reviewed and is understood.  Full responsibility of the confidentiality of   this discharge information lies with you and/or your care-partner.  Diverticulosis information given.   

## 2012-08-28 NOTE — Op Note (Signed)
[facility] [address] [place], [address]   FLEX SIGMOIDOSCOPY PROCEDURE REPORT  PATIENT: Margaret Rangel, Margaret Rangel  MR#: [id number] BIRTHDATE: 09/03/1945 , 66  yrs. old GENDER: Female ENDOSCOPIST: Louis Meckel, MD REFERRED BY: PROCEDURE DATE:  08/28/2012 PROCEDURE:   Sigmoidoscopy, diagnostic INDICATIONS:abdominal pain in the lower left quadrant. MEDICATIONS: MAC sedation, administered by CRNA and propofol (Diprivan) 150mg  IV  DESCRIPTION OF PROCEDURE:    Physical exam was performed.  Informed consent was obtained from the patient after explaining the benefits, risks, and alternatives to procedure.  The patient was connected to monitor and placed in left lateral position. Continuous oxygen was provided by nasal cannula and IV medicine administered through an indwelling cannula.  After administration of sedation and rectal exam, the patients rectum was intubated and the LB [id number]  colonoscope was advanced under direct visualization to the descending colon (60 cm) The scope was removed slowly by carefully examining the color, texture, anatomy, and integrity mucosa on the way out.  The patient was recovered in endoscopy and discharged home in satisfactory condition.       COLON FINDINGS: The colonic mucosa appeared normal and Mild diverticulosis was noted in the sigmoid colon.    there were no structural abnormalities  PREP QUALITY: CECAL W/D TIME:  COMPLICATIONS: None  ENDOSCOPIC IMPRESSION: diverticulosis - mild  No findings to explain abdominal pain  RECOMMENDATIONS: Medrol dose pack for probable musculoskeletal pain       _______________________________ eSigned:  Louis Meckel, MD 08/28/2012 3:28 PM     PATIENT NAME:  Margaret Rangel, Margaret Rangel MR#: [id number]

## 2012-08-29 ENCOUNTER — Telehealth: Payer: Self-pay | Admitting: *Deleted

## 2012-08-29 NOTE — Telephone Encounter (Signed)
  Follow up Call-  Call back number 08/28/2012 12/25/2010  Post procedure Call Back phone  # [phone]  Permission to leave phone message Yes -     Patient questions:  Do you have a fever, pain , or abdominal swelling? no Pain Score  0 *  Have you tolerated food without any problems? yes  Have you been able to return to your normal activities? yes  Do you have any questions about your discharge instructions: Diet   no Medications  no Follow up visit  no  Do you have questions or concerns about your Care? no  Actions: * If pain score is 4 or above: No action needed, pain <4. Pt states she has a sore rectum and it has been sore since increased stools yesterday with prep for procedure. Pt asked if she could use the cream given for irritated rectum/hemorrhoids and instructed yes was fine. She also said she has used some neosporin on there outside of the rectum and asked if that was ok and instructed yes. Pt instructed to call if pain/soreness increases to area but should relieve itself with above treatment. ewm

## 2012-09-09 ENCOUNTER — Telehealth: Payer: Self-pay | Admitting: Gastroenterology

## 2012-09-09 NOTE — Telephone Encounter (Signed)
Pt states she is not constipated, states she is having BM's but they are not like her normal BM's. Pt states she is having LLQ abdominal pain and requests something for pain. Offered pt an appt today to see Dr. Arlyce Dice but pt states she cannot come today. Pt asked for an appt on Thursday. Pt scheduled to see Doug Sou PA 09/11/12@1 :30pm. Pt aware of appt date and time.

## 2012-09-11 ENCOUNTER — Other Ambulatory Visit (INDEPENDENT_AMBULATORY_CARE_PROVIDER_SITE_OTHER): Payer: Medicare Other

## 2012-09-11 ENCOUNTER — Encounter: Payer: Self-pay | Admitting: Gastroenterology

## 2012-09-11 ENCOUNTER — Ambulatory Visit (INDEPENDENT_AMBULATORY_CARE_PROVIDER_SITE_OTHER): Payer: Medicare Other | Admitting: Gastroenterology

## 2012-09-11 VITALS — BP 142/70 | HR 88 | Ht 62.5 in | Wt 165.2 lb

## 2012-09-11 DIAGNOSIS — R110 Nausea: Secondary | ICD-10-CM | POA: Insufficient documentation

## 2012-09-11 DIAGNOSIS — R1032 Left lower quadrant pain: Secondary | ICD-10-CM

## 2012-09-11 DIAGNOSIS — R1310 Dysphagia, unspecified: Secondary | ICD-10-CM | POA: Insufficient documentation

## 2012-09-11 LAB — CBC WITH DIFFERENTIAL/PLATELET
Basophils Absolute: 0 10*3/uL (ref 0.0–0.1)
Basophils Relative: 0.3 % (ref 0.0–3.0)
Eosinophils Absolute: 0.1 10*3/uL (ref 0.0–0.7)
Eosinophils Relative: 1.2 % (ref 0.0–5.0)
HCT: 41.4 % (ref 36.0–46.0)
Hemoglobin: 14.3 g/dL (ref 12.0–15.0)
Lymphocytes Relative: 29.1 % (ref 12.0–46.0)
Lymphs Abs: 3.3 10*3/uL (ref 0.7–4.0)
MCHC: 34.5 g/dL (ref 30.0–36.0)
MCV: 85 fl (ref 78.0–100.0)
Monocytes Absolute: 0.8 10*3/uL (ref 0.1–1.0)
Monocytes Relative: 7.4 % (ref 3.0–12.0)
Neutro Abs: 7 10*3/uL (ref 1.4–7.7)
Neutrophils Relative %: 62 % (ref 43.0–77.0)
Platelets: 281 10*3/uL (ref 150.0–400.0)
RBC: 4.87 Mil/uL (ref 3.87–5.11)
RDW: 14 % (ref 11.5–14.6)
WBC: 11.3 10*3/uL — ABNORMAL HIGH (ref 4.5–10.5)

## 2012-09-11 LAB — BASIC METABOLIC PANEL
BUN: 14 mg/dL (ref 6–23)
CO2: 25 mEq/L (ref 19–32)
Calcium: 9.3 mg/dL (ref 8.4–10.5)
Chloride: 104 mEq/L (ref 96–112)
Creatinine, Ser: 0.9 mg/dL (ref 0.4–1.2)
GFR: 84.41 mL/min (ref >60.00)
Glucose, Bld: 106 mg/dL — ABNORMAL HIGH (ref 70–99)
Potassium: 3.4 mEq/L — ABNORMAL LOW (ref 3.5–5.1)
Sodium: 140 mEq/L (ref 135–145)

## 2012-09-11 MED ORDER — ONDANSETRON 4 MG PO TBDP: 4.0000 mg | ORAL_TABLET | Freq: Four times a day (QID) | ORAL | Status: DC | PRN

## 2012-09-11 MED ORDER — TRAMADOL HCL 50 MG PO TABS: 50.0000 mg | ORAL_TABLET | Freq: Four times a day (QID) | ORAL | Status: DC | PRN

## 2012-09-11 MED ORDER — CIPROFLOXACIN HCL 500 MG PO TABS: 500.0000 mg | ORAL_TABLET | Freq: Two times a day (BID) | ORAL | Status: DC

## 2012-09-11 MED ORDER — METRONIDAZOLE 500 MG PO TABS: 500.0000 mg | ORAL_TABLET | Freq: Three times a day (TID) | ORAL | Status: DC

## 2012-09-11 NOTE — Patient Instructions (Addendum)
Your physician has requested that you go to the basement for the following lab work before leaving today: CBC, BMET  We have sent the following medications to your pharmacy for you to pick up at your convenience: Cipro, Flagyl, Ultram, Zofran     Do not drink any alcohol with the antibiotics.  You have been scheduled for a CT scan of the abdomen and pelvis at [facility] ([address]---this is in the same building as [facility]).   You are scheduled on 09/12/12 at 1:00pm. You should arrive 15 minutes prior to your appointment time for registration. Please follow the written instructions below on the day of your exam:  WARNING: IF YOU ARE ALLERGIC TO IODINE/X-RAY DYE, PLEASE NOTIFY RADIOLOGY IMMEDIATELY AT [phone]! YOU WILL BE GIVEN A 13 HOUR PREMEDICATION PREP.  1) Do not eat or drink anything after 9:00am (4 hours prior to your test) 2) You have been given 2 bottles of oral contrast to drink. The solution may taste               better if refrigerated, but do NOT add ice or any other liquid to this solution. Shake             well before drinking.    Drink 1 bottle of contrast @ 11:00am (2 hours prior to your exam)  Drink 1 bottle of contrast @ 12:00noon (1 hour prior to your exam)  You may take any medications as prescribed with a small amount of water except for the following: Metformin, Glucophage, Glucovance, Avandamet, Riomet, Fortamet, Actoplus Met, Janumet, Glumetza or Metaglip. The above medications must be held the day of the exam AND 48 hours after the exam.  The purpose of you drinking the oral contrast is to aid in the visualization of your intestinal tract. The contrast solution may cause some diarrhea. Before your exam is started, you will be given a small amount of fluid to drink. Depending on your individual set of symptoms, you may also receive an intravenous injection of x-ray contrast/dye. Plan on being at [facility] for 30 minutes or  long, depending on the type of exam you are having performed.  This test typically takes 30-45 minutes to complete.  If you have any questions regarding your exam or if you need to reschedule, you may call the CT department at [phone] between the hours of 8:00 am and 5:00 pm, Monday-Friday.  _____________________________________________________________________  Margaret Rangel have been scheduled for a Barium Esophogram at [facility] (1st floor of the hospital) on 09/17/2012 at 1030am. Please arrive 15 minutes prior to your appointment for registration. Make certain not to have anything to eat or drink 6 hours prior to your test. If you need to reschedule for any reason, please contact radiology at [phone] to do so. __________________________________________________________________ A barium swallow is an examination that concentrates on views of the esophagus. This tends to be a double contrast exam (barium and two liquids which, when combined, create a gas to distend the wall of the oesophagus) or single contrast (non-ionic iodine based). The study is usually tailored to your symptoms so a good history is essential. Attention is paid during the study to the form, structure and configuration of the esophagus, looking for functional disorders (such as aspiration, dysphagia, achalasia, motility and reflux) EXAMINATION You may be asked to change into a gown, depending on the type of swallow being performed. A radiologist and radiographer will perform the procedure. The radiologist will advise  you of the type of contrast selected for your procedure and direct you during the exam. You will be asked to stand, sit or lie in several different positions and to hold a small amount of fluid in your mouth before being asked to swallow while the imaging is performed .In some instances you may be asked to swallow barium coated marshmallows to assess the motility of a solid food bolus. The exam can be recorded  as a digital or video fluoroscopy procedure. POST PROCEDURE It will take 1-2 days for the barium to pass through your system. To facilitate this, it is important, unless otherwise directed, to increase your fluids for the next 24-48hrs and to resume your normal diet.  This test typically takes about 30 minutes to perform. __________________________________________________________________________________

## 2012-09-11 NOTE — Progress Notes (Signed)
Reviewed and agree with management. Robert D. Kaplan, M.D., FACG  

## 2012-09-11 NOTE — Progress Notes (Addendum)
09/11/2012 Margaret Rangel [id number] Nov 21, 1945   History of Present Illness:  Patient is a 67 year old female who is known to Dr. Arlyce Dice.  Has history of chronic LLQ abdominal pain.  Recently underwent flex sig with hemorrhoidal banding for rectal bleeding in 06/2012 and then another flex sig for evaluation of her LLQ abdominal pain earlier this month on 5/15, which showed only mild diverticulosis.  She was given a medrol dose pack for musculoskeletal pain.  She was given this appointment today for worsening LLQ abdominal pain.  Says that this pain is different and worse than the pain that she had previously.  Says that it began severe 3 days ago.  Took the Medrol dose pak that she was given after her last flex sig but a couple of days later pain returned worse than previously.  Was given NSAID's previously for muscular pain as well.  Has bentyl but does not help.  Is asking for something for pain.  Pain is worse in the LLQ, but is across her entire lower abdomen.  Says that LLQ pain is worse with a BM.  Says that BM's are thin.  No blood since hemorrhoid banding.  Feels warm but has not taken her temp.  Has nausea and intermittent vomiting as well.  Drinking a lot of fluids.    Also complaining of some solid food dysphagia.  Feels like some foods just sit in her "throat" then she continues to taste them and will eventually bring them back up.  Other times food goes down just fine.  No problems with liquids.  This apparently all began 3 days ago as well, and she denies any previous history of problems with dysphagia.    Current Medications, Allergies, Past Medical History, Past Surgical History, Family History and Social History were reviewed in [employer] record.   Physical Exam: BP 142/70  Pulse 88  Ht 5' 2.5" (1.588 m)  Wt 165 lb 4 oz (74.957 kg)  BMI 29.72 kg/m2 General: Well developed, black female in no acute distress; tearful and uncomfortable Head: Normocephalic  and atraumatic Eyes:  sclerae anicteric, conjunctiva pink  Ears: Normal auditory acuity Lungs: Clear throughout to auscultation Heart: Regular rate and rhythm. SEM noted. Abdomen: Soft, non-distended.  Large low vertical scar noted from previous hysterectomy.  BS present.  Diffuse lower abdominal TTP > in the LLQ. Musculoskeletal: Symmetrical with no gross deformities  Extremities: No edema.  Neurological: Alert oriented x 4, grossly nonfocal Psychological:  Alert and cooperative.  Tearful.    Assessment and Recommendations: -LLQ abdominal pain with nausea and poor appetite:  Will order CT scan soon to rule out diverticulitis vs other etiologies.  ? Adhesions from previous abdominal surgery.  I will start her on empiric antibiotics (cipro and flagyl) until CT scan is performed.  Will give zofran for nausea.  She is asking for something for pain and is allergic to codeine so will give her ultram.  Labs today as well (CBC and BMP).  We did advise her that she should not drink ETOH while taking Flagyl since there is a history of ETOH use listed in her chart. -Dysphagia:  In upper esophagus.  Feels like food sits in there and then she can taste it and brings it back up hours later.  Will check barium esophagram to rule out Zenker's and other etiologies.

## 2012-09-12 ENCOUNTER — Ambulatory Visit (INDEPENDENT_AMBULATORY_CARE_PROVIDER_SITE_OTHER)
Admission: RE | Admit: 2012-09-12 | Discharge: 2012-09-12 | Disposition: A | Discharge status: Home / Self Care | Payer: Medicare Other | Source: Ambulatory Visit | Attending: Gastroenterology | Admitting: Gastroenterology

## 2012-09-12 DIAGNOSIS — R1032 Left lower quadrant pain: Secondary | ICD-10-CM

## 2012-09-12 DIAGNOSIS — R110 Nausea: Secondary | ICD-10-CM

## 2012-09-12 MED ORDER — IOHEXOL 300 MG/ML  SOLN
100.0000 mL | Freq: Once | INTRAMUSCULAR | Status: AC | PRN
  Administered 2012-09-12: 100 mL via INTRAVENOUS

## 2012-09-17 ENCOUNTER — Other Ambulatory Visit: Payer: Self-pay | Admitting: *Deleted

## 2012-09-17 ENCOUNTER — Ambulatory Visit (HOSPITAL_COMMUNITY)
Admission: RE | Admit: 2012-09-17 | Discharge: 2012-09-17 | Disposition: A | Discharge status: Home / Self Care | Payer: Medicare Other | Source: Ambulatory Visit | Attending: Gastroenterology | Admitting: Gastroenterology

## 2012-09-17 DIAGNOSIS — R1319 Other dysphagia: Secondary | ICD-10-CM

## 2012-09-17 DIAGNOSIS — K219 Gastro-esophageal reflux disease without esophagitis: Secondary | ICD-10-CM | POA: Insufficient documentation

## 2012-09-17 DIAGNOSIS — R1310 Dysphagia, unspecified: Secondary | ICD-10-CM | POA: Insufficient documentation

## 2012-09-18 ENCOUNTER — Encounter: Payer: Self-pay | Admitting: Gastroenterology

## 2012-09-22 ENCOUNTER — Ambulatory Visit (AMBULATORY_SURGERY_CENTER): Payer: Medicare Other | Admitting: *Deleted

## 2012-09-22 VITALS — Ht 62.5 in | Wt 167.8 lb

## 2012-09-22 DIAGNOSIS — R1310 Dysphagia, unspecified: Secondary | ICD-10-CM

## 2012-09-23 ENCOUNTER — Encounter: Payer: Self-pay | Admitting: Gastroenterology

## 2012-09-25 ENCOUNTER — Ambulatory Visit: Payer: Medicare Other

## 2012-09-30 ENCOUNTER — Ambulatory Visit (AMBULATORY_SURGERY_CENTER): Payer: Medicare Other | Admitting: Gastroenterology

## 2012-09-30 ENCOUNTER — Encounter: Payer: Self-pay | Admitting: Gastroenterology

## 2012-09-30 VITALS — BP 167/66 | HR 87 | Temp 99.1°F | Resp 26 | Ht 62.0 in | Wt 167.0 lb

## 2012-09-30 DIAGNOSIS — K2990 Gastroduodenitis, unspecified, without bleeding: Secondary | ICD-10-CM

## 2012-09-30 DIAGNOSIS — K2960 Other gastritis without bleeding: Secondary | ICD-10-CM

## 2012-09-30 DIAGNOSIS — R1310 Dysphagia, unspecified: Secondary | ICD-10-CM

## 2012-09-30 DIAGNOSIS — K222 Esophageal obstruction: Secondary | ICD-10-CM

## 2012-09-30 DIAGNOSIS — K2970 Gastritis, unspecified, without bleeding: Secondary | ICD-10-CM

## 2012-09-30 LAB — GLUCOSE, CAPILLARY
Glucose-Capillary: 95 mg/dL (ref 70–99)
Glucose-Capillary: 87 mg/dL (ref 70–99)

## 2012-09-30 MED ORDER — SODIUM CHLORIDE 0.9 % IV SOLN: 500.0000 mL | INTRAVENOUS | Status: DC

## 2012-09-30 NOTE — Progress Notes (Signed)
Report to pacu rn, vss, bbs=clear 

## 2012-09-30 NOTE — Patient Instructions (Addendum)
Discharge instructions given with verbal understanding. Handout on a dilatation diet. Resume previous medications. YOU HAD AN ENDOSCOPIC PROCEDURE TODAY AT THE [facility]: Refer to the procedure report that was given to you for any specific questions about what was found during the examination.  If the procedure report does not answer your questions, please call your gastroenterologist to clarify.  If you requested that your care partner not be given the details of your procedure findings, then the procedure report has been included in a sealed envelope for you to review at your convenience later.  YOU SHOULD EXPECT: Some feelings of bloating in the abdomen. Passage of more gas than usual.  Walking can help get rid of the air that was put into your GI tract during the procedure and reduce the bloating. If you had a lower endoscopy (such as a colonoscopy or flexible sigmoidoscopy) you may notice spotting of blood in your stool or on the toilet paper. If you underwent a bowel prep for your procedure, then you may not have a normal bowel movement for a few days.  DIET: Your first meal following the procedure should be a light meal and then it is ok to progress to your normal diet.  A half-sandwich or bowl of soup is an example of a good first meal.  Heavy or fried foods are harder to digest and may make you feel nauseous or bloated.  Likewise meals heavy in dairy and vegetables can cause extra gas to form and this can also increase the bloating.  Drink plenty of fluids but you should avoid alcoholic beverages for 24 hours.  ACTIVITY: Your care partner should take you home directly after the procedure.  You should plan to take it easy, moving slowly for the rest of the day.  You can resume normal activity the day after the procedure however you should NOT DRIVE or use heavy machinery for 24 hours (because of the sedation medicines used during the test).    SYMPTOMS TO REPORT IMMEDIATELY: A  gastroenterologist can be reached at any hour.  During normal business hours, 8:30 AM to 5:00 PM Monday through Friday, call [phone].  After hours and on weekends, please call the GI answering service at [phone] who will take a message and have the physician on call contact you.   Following upper endoscopy (EGD)  Vomiting of blood or coffee ground material  New chest pain or pain under the shoulder blades  Painful or persistently difficult swallowing  New shortness of breath  Fever of 100F or higher  Black, tarry-looking stools  FOLLOW UP: If any biopsies were taken you will be contacted by phone or by letter within the next 1-3 weeks.  Call your gastroenterologist if you have not heard about the biopsies in 3 weeks.  Our staff will call the home number listed on your records the next business day following your procedure to check on you and address any questions or concerns that you may have at that time regarding the information given to you following your procedure. This is a courtesy call and so if there is no answer at the home number and we have not heard from you through the emergency physician on call, we will assume that you have returned to your regular daily activities without incident.  SIGNATURES/CONFIDENTIALITY: You and/or your care partner have signed paperwork which will be entered into your electronic medical record.  These signatures attest to the fact that that the information above   on your After Visit Summary has been reviewed and is understood.  Full responsibility of the confidentiality of this discharge information lies with you and/or your care-partner. 

## 2012-09-30 NOTE — Progress Notes (Signed)
Patient did not experience any of the following events: a burn prior to discharge; a fall within the facility; wrong site/side/patient/procedure/implant event; or a hospital transfer or hospital admission upon discharge from the facility. (G8907) Patient did not have preoperative order for IV antibiotic SSI prophylaxis. (G8918)  

## 2012-09-30 NOTE — Op Note (Signed)
[facility] [address] [place], [address]   ENDOSCOPY PROCEDURE REPORT  PATIENT: Margaret Rangel, Margaret Rangel  MR#: [id number] BIRTHDATE: Feb 06, 1946 , 66  yrs. old GENDER: Female ENDOSCOPIST: Louis Meckel, MD REFERRED BY: PROCEDURE DATE:  09/30/2012 PROCEDURE:  EGD w/ biopsy and Maloney dilation of esophagus ASA CLASS:     Class II INDICATIONS:  Dysphagia..  Barium swallow demonstrates a distal esophageal stricture with hangup of a barium tablet MEDICATIONS: MAC sedation, administered by CRNA, Propofol (Diprivan) 130 mg IV, and Simethicone 0.6cc PO TOPICAL ANESTHETIC: Cetacaine Spray  DESCRIPTION OF PROCEDURE: After the risks benefits and alternatives of the procedure were thoroughly explained, informed consent was obtained.  The LB [id number] endoscope was introduced through the mouth and advanced to the third portion of the duodenum. Without limitations.  The instrument was slowly withdrawn as the mucosa was fully examined.      There is an early esophageal stricture at the GE junction.  The 9.8 mm gastroscope easily traversed the stricture.   In the prepyloric antrum there were enlarged folds that were soft. There was moderate erythema. Biopsies were taken.   The remainder of the upper endoscopy exam was otherwise normal.  Retroflexed views revealed no abnormalities.     The scope was then withdrawn from the patient and the procedure completed.  COMPLICATIONS: There were no complications.  ENDOSCOPIC IMPRESSION: 1.  enlarged gastric folds 2.  early esophageal stricture-status post Elease Hashimoto dilation  RECOMMENDATIONS: Followup as needed for dysphagia Await biopsy results REPEAT EXAM:  eSigned:  Louis Meckel, MD 09/30/2012 4:04 PM   CC:

## 2012-09-30 NOTE — Progress Notes (Signed)
Called to room to assist during endoscopic procedure.  Patient ID and intended procedure confirmed with present staff. Received instructions for my participation in the procedure from the performing physician.  

## 2012-10-01 ENCOUNTER — Telehealth: Payer: Self-pay | Admitting: *Deleted

## 2012-10-01 NOTE — Telephone Encounter (Signed)
  Follow up Call-  Call back number 09/30/2012 08/28/2012 12/25/2010  Post procedure Call Back phone  # [phone]  Permission to leave phone message Yes Yes -     Patient questions:  Do you have a fever, pain , or abdominal swelling? no Pain Score  0 *  Have you tolerated food without any problems? yes  Have you been able to return to your normal activities? yes  Do you have any questions about your discharge instructions: Diet   no Medications  no Follow up visit  no  Do you have questions or concerns about your Care? no  Actions: * If pain score is 4 or above: No action needed, pain <4.

## 2012-10-07 ENCOUNTER — Encounter: Payer: Self-pay | Admitting: Gastroenterology

## 2012-10-15 ENCOUNTER — Telehealth: Payer: Self-pay | Admitting: Gastroenterology

## 2012-10-15 NOTE — Telephone Encounter (Signed)
Pt states she is not able to eat much at all. States when she does eat a little bit her stomach hurts and burns, states she is only able to eat about 2 tablespoon of food. Pt scheduled to see Dr. Arlyce Dice Monday 10/20/12@10 :15am.

## 2012-10-20 ENCOUNTER — Ambulatory Visit: Payer: Medicare Other | Admitting: Gastroenterology

## 2012-10-23 ENCOUNTER — Ambulatory Visit: Payer: Medicare Other

## 2012-11-11 ENCOUNTER — Ambulatory Visit: Payer: Medicare Other

## 2012-11-17 ENCOUNTER — Other Ambulatory Visit (HOSPITAL_COMMUNITY): Payer: Self-pay | Admitting: Internal Medicine

## 2012-11-17 ENCOUNTER — Ambulatory Visit (HOSPITAL_COMMUNITY)
Admission: RE | Admit: 2012-11-17 | Discharge: 2012-11-17 | Disposition: A | Discharge status: Home / Self Care | Payer: Medicare Other | Source: Ambulatory Visit | Attending: Internal Medicine | Admitting: Internal Medicine

## 2012-11-17 DIAGNOSIS — I700 Atherosclerosis of aorta: Secondary | ICD-10-CM | POA: Insufficient documentation

## 2012-11-17 DIAGNOSIS — M545 Low back pain, unspecified: Secondary | ICD-10-CM

## 2012-11-17 DIAGNOSIS — M5137 Other intervertebral disc degeneration, lumbosacral region: Secondary | ICD-10-CM | POA: Insufficient documentation

## 2012-11-17 DIAGNOSIS — K5730 Diverticulosis of large intestine without perforation or abscess without bleeding: Secondary | ICD-10-CM | POA: Insufficient documentation

## 2012-11-17 DIAGNOSIS — M51379 Other intervertebral disc degeneration, lumbosacral region without mention of lumbar back pain or lower extremity pain: Secondary | ICD-10-CM | POA: Insufficient documentation

## 2012-12-15 DEATH — deceased

## 2013-02-20 ENCOUNTER — Other Ambulatory Visit: Payer: Self-pay | Admitting: Neurosurgery

## 2013-02-20 DIAGNOSIS — M545 Low back pain, unspecified: Secondary | ICD-10-CM

## 2013-02-23 ENCOUNTER — Ambulatory Visit: Payer: Medicare Other

## 2013-03-01 ENCOUNTER — Ambulatory Visit
Admission: RE | Admit: 2013-03-01 | Discharge: 2013-03-01 | Disposition: A | Discharge status: Home / Self Care | Payer: Medicaid Other | Source: Ambulatory Visit | Attending: Neurosurgery | Admitting: Neurosurgery

## 2013-03-01 DIAGNOSIS — M545 Low back pain, unspecified: Secondary | ICD-10-CM

## 2013-03-19 ENCOUNTER — Ambulatory Visit: Payer: Medicare Other

## 2013-09-22 ENCOUNTER — Encounter (HOSPITAL_COMMUNITY): Payer: Self-pay | Admitting: Emergency Medicine

## 2013-09-22 ENCOUNTER — Emergency Department (HOSPITAL_COMMUNITY): Payer: Medicare HMO

## 2013-09-22 ENCOUNTER — Emergency Department (HOSPITAL_COMMUNITY)
Admission: EM | Admit: 2013-09-22 | Discharge: 2013-09-22 | Disposition: A | Discharge status: Home / Self Care | Payer: Medicare HMO | Attending: Emergency Medicine | Admitting: Emergency Medicine

## 2013-09-22 DIAGNOSIS — E669 Obesity, unspecified: Secondary | ICD-10-CM | POA: Diagnosis not present

## 2013-09-22 DIAGNOSIS — M549 Dorsalgia, unspecified: Secondary | ICD-10-CM | POA: Insufficient documentation

## 2013-09-22 DIAGNOSIS — J45909 Unspecified asthma, uncomplicated: Secondary | ICD-10-CM | POA: Diagnosis not present

## 2013-09-22 DIAGNOSIS — K5900 Constipation, unspecified: Secondary | ICD-10-CM

## 2013-09-22 DIAGNOSIS — F17200 Nicotine dependence, unspecified, uncomplicated: Secondary | ICD-10-CM | POA: Insufficient documentation

## 2013-09-22 DIAGNOSIS — E119 Type 2 diabetes mellitus without complications: Secondary | ICD-10-CM | POA: Diagnosis not present

## 2013-09-22 DIAGNOSIS — Z860100 Personal history of colon polyps, unspecified: Secondary | ICD-10-CM | POA: Insufficient documentation

## 2013-09-22 DIAGNOSIS — E785 Hyperlipidemia, unspecified: Secondary | ICD-10-CM | POA: Insufficient documentation

## 2013-09-22 DIAGNOSIS — G8929 Other chronic pain: Secondary | ICD-10-CM | POA: Insufficient documentation

## 2013-09-22 DIAGNOSIS — Z8673 Personal history of transient ischemic attack (TIA), and cerebral infarction without residual deficits: Secondary | ICD-10-CM | POA: Insufficient documentation

## 2013-09-22 DIAGNOSIS — R1084 Generalized abdominal pain: Secondary | ICD-10-CM | POA: Diagnosis present

## 2013-09-22 DIAGNOSIS — Z8739 Personal history of other diseases of the musculoskeletal system and connective tissue: Secondary | ICD-10-CM | POA: Diagnosis not present

## 2013-09-22 DIAGNOSIS — R109 Unspecified abdominal pain: Secondary | ICD-10-CM

## 2013-09-22 DIAGNOSIS — Z79899 Other long term (current) drug therapy: Secondary | ICD-10-CM | POA: Diagnosis not present

## 2013-09-22 DIAGNOSIS — I10 Essential (primary) hypertension: Secondary | ICD-10-CM | POA: Diagnosis not present

## 2013-09-22 MED ORDER — DICYCLOMINE HCL 20 MG PO TABS: 20.0000 mg | ORAL_TABLET | Freq: Two times a day (BID) | ORAL | Status: DC | PRN

## 2013-09-22 MED ORDER — DICYCLOMINE HCL 10 MG PO CAPS
10.0000 mg | ORAL_CAPSULE | Freq: Once | ORAL | Status: AC
  Administered 2013-09-22: 10 mg via ORAL
  Filled 2013-09-22: qty 1

## 2013-09-22 MED ORDER — MINERAL OIL RE ENEM: 1.0000 | ENEMA | Freq: Once | RECTAL | Status: DC

## 2013-09-22 MED ORDER — MORPHINE SULFATE 4 MG/ML IJ SOLN
4.0000 mg | Freq: Once | INTRAMUSCULAR | Status: AC
  Administered 2013-09-22: 4 mg via INTRAMUSCULAR
  Filled 2013-09-22: qty 1

## 2013-09-22 NOTE — ED Notes (Signed)
Bed: WA20 Expected date: 09/22/13 Expected time: 6:57 PM Means of arrival: Ambulance Comments: abd pain

## 2013-09-22 NOTE — ED Notes (Signed)
Pt presented by EMS, reports of 10/10 abd pain, reports not having a BM in the last 2 days despite taking laxatives.

## 2013-09-22 NOTE — ED Provider Notes (Signed)
CSN: [id number]     Arrival date & time 09/22/13  1915 History   First MD Initiated Contact with Patient 09/22/13 2010     Chief Complaint  Patient presents with  . Abdominal Pain  . Constipation     (Consider location/radiation/quality/duration/timing/severity/associated sxs/prior Treatment) HPI Comments: Patient is a 68 year old female past medical history significant for DM, HTN, IBS, GERD, chronic back pain presented to the emergency department for intermittent diffuse generalized abdominal cramping with associated constipation. Patient states she is having abdominal pain on and off for a week, but has not had a bowel movement in 2 days. Patient states she was seen by her primary care physician last week prescribed laxatives so she has been taking.Alleviating factors: none. Aggravating factors: none. Denies any fevers chills, nausea, vomiting, urinary symptoms. Abdominal surgical history includes partial hysterectomy.  Patient is a 68 y.o. female presenting with abdominal pain and constipation.  Abdominal Pain Associated symptoms: constipation   Associated symptoms: no chills, no diarrhea, no fever, no nausea and no vomiting   Constipation Associated symptoms: abdominal pain   Associated symptoms: no diarrhea, no fever, no nausea and no vomiting     Past Medical History  Diagnosis Date  . DM (diabetes mellitus)   . HTN (hypertension)   . Chronic back pain   . Asthma   . Irritable bowel syndrome (IBS)   . Low blood potassium   . Obese   . GERD (gastroesophageal reflux disease)   . Anal fissure   . Diverticulosis of colon (without mention of hemorrhage) 2009    Colonoscopy  . Hemorrhoids   . Abdominal pain   . Hernia   . Arthritis   . Hyperlipidemia     low  . Colon adenoma 2009, 2012    Colonoscopy  . Stroke     x2   Past Surgical History  Procedure Laterality Date  . Partial hysterectomy  1971  . Tonsillectomy    . Flexible sigmoidoscopy N/A 07/14/2012   Procedure: FLEXIBLE SIGMOIDOSCOPY;  Surgeon: Inda Castle, MD;  Location: [facility];  Service: Endoscopy;  Laterality: N/A;  . Hemorrhoid banding N/A 07/14/2012    Procedure: HEMORRHOID BANDING;  Surgeon: Inda Castle, MD;  Location: [facility];  Service: Endoscopy;  Laterality: N/A;   Family History  Problem Relation Age of Onset  . Colon cancer Neg Hx   . Prostate cancer Brother    History  Substance Use Topics  . Smoking status: Current Some Day Smoker -- 0.25 packs/day    Types: Cigarettes  . Smokeless tobacco: Never Used  . Alcohol Use: 12.6 oz/week    21 Cans of beer per week   OB History   Grav Para Term Preterm Abortions TAB SAB Ect Mult Living                 Review of Systems  Constitutional: Negative for fever and chills.  Gastrointestinal: Positive for abdominal pain and constipation. Negative for nausea, vomiting, diarrhea, blood in stool, abdominal distention, anal bleeding and rectal pain.  All other systems reviewed and are negative.     Allergies  Codeine and Penicillins  Home Medications   Prior to Admission medications   Medication Sig Start Date End Date Taking? Authorizing Provider  Albuterol Sulfate (PROAIR HFA IN) Inhale 2 puffs into the lungs every 4 (four) hours as needed (sob).    Yes Historical Provider, MD  amLODipine (NORVASC) 5 MG tablet Take 5 mg by mouth daily.  Yes Historical Provider, MD  Cholecalciferol (VITAMIN D3) 1000 UNITS CAPS Take 1 capsule by mouth daily.   Yes Historical Provider, MD  clonazePAM (KLONOPIN) 0.5 MG tablet Take 0.5 mg by mouth 2 (two) times daily as needed for anxiety.    Yes Historical Provider, MD  diphenhydrAMINE (BENADRYL) 25 mg capsule Take 25 mg by mouth 2 (two) times daily as needed for allergies.   Yes Historical Provider, MD  glimepiride (AMARYL) 4 MG tablet Take 4 mg by mouth daily.    Yes Historical Provider, MD  ibuprofen (ADVIL,MOTRIN) 200 MG tablet Take 400 mg by mouth every 8 (eight) hours as  needed.    Yes Historical Provider, MD  metoCLOPramide (REGLAN) 10 MG tablet Take 10 mg by mouth 2 (two) times daily as needed for nausea.    Yes Historical Provider, MD  pantoprazole (PROTONIX) 40 MG tablet Take 40 mg by mouth daily.   Yes Historical Provider, MD  dicyclomine (BENTYL) 20 MG tablet Take 1 tablet (20 mg total) by mouth 2 (two) times daily as needed for spasms. 09/22/13   Yomaira Solar L Alixander Rallis, PA-C  glucose blood test strip 1 each by Other route as needed. Use as instructed     Historical Provider, MD  mineral oil enema Place 133 mLs (1 enema total) rectally once. 09/22/13   Lavene Penagos L Terren Haberle, PA-C   BP 160/50  Pulse 80  Temp(Src) 98.5 F (36.9 C) (Oral)  Resp 18  SpO2 100% Physical Exam  Nursing note and vitals reviewed. Constitutional: She is oriented to person, place, and time. She appears well-developed and well-nourished. No distress.  HENT:  Head: Normocephalic and atraumatic.  Right Ear: External ear normal.  Left Ear: External ear normal.  Nose: Nose normal.  Mouth/Throat: Oropharynx is clear and moist. No oropharyngeal exudate.  Eyes: Conjunctivae are normal.  Neck: Normal range of motion. Neck supple.  Cardiovascular: Normal rate, regular rhythm and normal heart sounds.   Pulmonary/Chest: Effort normal and breath sounds normal. No respiratory distress.  Abdominal: Soft. Bowel sounds are normal. She exhibits no distension. There is generalized tenderness. There is no rigidity, no rebound and no guarding.  Musculoskeletal: Normal range of motion. She exhibits no edema.  Neurological: She is alert and oriented to person, place, and time.  Skin: Skin is warm and dry. She is not diaphoretic.  Psychiatric: She has a normal mood and affect.    ED Course  Procedures (including critical care time) Medications  dicyclomine (BENTYL) capsule 10 mg (10 mg Oral Given 09/22/13 2055)  morphine 4 MG/ML injection 4 mg (4 mg Intramuscular Given 09/22/13 2143)    Labs  Review Labs Reviewed - No data to display  Imaging Review Dg Abd 2 Views  09/22/2013   CLINICAL DATA:  Abdominal pain and constipation.  EXAM: ABDOMEN - 2 VIEW  COMPARISON:  Abdominal CT 08/25/2011  FINDINGS: Right and transverse colonic fluid levels which are nonspecific given lack of colonic dilatation. There is no dilated small bowel or definite small bowel fluid level. No pneumoperitoneum. No abnormal intra-abdominal mass effect or calcification. Rounded density at the left lung base is possibly a nipple shadow, no pulmonary nodule on recent abdominal CT. No acute osseous findings.  IMPRESSION: Increased colonic fluid content. No evidence of bowel obstruction or perforation.   Electronically Signed   By: Jorje Guild M.D.   On: 09/22/2013 21:09     EKG Interpretation None      MDM   Final diagnoses:  Constipation  Abdominal  pain    Discussed patient case with nurse, who states just prior to  My evaluation of patient, the patient was endorsing improvement of symptoms. Patient also told nurse that she has been passing flatus.   Filed Vitals:   09/22/13 2222  BP: 160/50  Pulse: 80  Temp: 98.5 F (36.9 C)  Resp: 18    9:31 PM Patient is complaining of back pain, states "Dr. Cyndy Freeze didn't give me my pain shot today and it's hurting real bad." She is requesting pain medication for her back. Discussed abdominal x-ray results with her and she is requesting that she be discharged with the enemas rather than receive them in the ED.    Afebrile, NAD, non-toxic appearing, AAOx4. Abdomen is soft, mild generalized tenderness, no rebound, guarding or rigidity. Bentyl given for cramping abdominal pain. Abdominal x-ray reveals fluid stool, likely related to MiraLax use over the last week. No evidence of bowel obstruction. No pneumoperitoneum. Patient prefers to receive enema as an outpatient. Return precautions discussed the patient is agreeable to plan. Patient stable upon discharge. Patient  d/w with Dr. Alvino Chapel, agrees with plan.     Harlow Mares, PA-C 09/23/13 1526

## 2013-09-22 NOTE — Discharge Instructions (Signed)
Please follow up with your primary care physician in 1-2 days. If you do not have one please call the [facility] number listed above. Please follow up with Dr. Cyndy Freeze to schedule a follow up appointment.  Please use enema as prescribed. Please read all discharge instructions and return precautions.   Constipation, Adult Constipation is when a person has fewer than 3 bowel movements a week; has difficulty having a bowel movement; or has stools that are dry, hard, or larger than normal. As people grow older, constipation is more common. If you try to fix constipation with medicines that make you have a bowel movement (laxatives), the problem may get worse. Long-term laxative use may cause the muscles of the colon to become weak. A low-fiber diet, not taking in enough fluids, and taking certain medicines may make constipation worse. CAUSES   Certain medicines, such as antidepressants, pain medicine, iron supplements, antacids, and water pills.   Certain diseases, such as diabetes, irritable bowel syndrome (IBS), thyroid disease, or depression.   Not drinking enough water.   Not eating enough fiber-rich foods.   Stress or travel.  Lack of physical activity or exercise.  Not going to the restroom when there is the urge to have a bowel movement.  Ignoring the urge to have a bowel movement.  Using laxatives too much. SYMPTOMS   Having fewer than 3 bowel movements a week.   Straining to have a bowel movement.   Having hard, dry, or larger than normal stools.   Feeling full or bloated.   Pain in the lower abdomen.  Not feeling relief after having a bowel movement. DIAGNOSIS  Your caregiver will take a medical history and perform a physical exam. Further testing may be done for severe constipation. Some tests may include:   A barium enema X-ray to examine your rectum, colon, and sometimes, your small intestine.  A sigmoidoscopy to examine your lower  colon.  A colonoscopy to examine your entire colon. TREATMENT  Treatment will depend on the severity of your constipation and what is causing it. Some dietary treatments include drinking more fluids and eating more fiber-rich foods. Lifestyle treatments may include regular exercise. If these diet and lifestyle recommendations do not help, your caregiver may recommend taking over-the-counter laxative medicines to help you have bowel movements. Prescription medicines may be prescribed if over-the-counter medicines do not work.  HOME CARE INSTRUCTIONS   Increase dietary fiber in your diet, such as fruits, vegetables, whole grains, and beans. Limit high-fat and processed sugars in your diet, such as [place] fries, hamburgers, cookies, candies, and soda.   A fiber supplement may be added to your diet if you cannot get enough fiber from foods.   Drink enough fluids to keep your urine clear or pale yellow.   Exercise regularly or as directed by your caregiver.   Go to the restroom when you have the urge to go. Do not hold it.  Only take medicines as directed by your caregiver. Do not take other medicines for constipation without talking to your caregiver first. [place] IF:   You have bright red blood in your stool.   Your constipation lasts for more than 4 days or gets worse.   You have abdominal or rectal pain.   You have thin, pencil-like stools.  You have unexplained weight loss. MAKE SURE YOU:   Understand these instructions.  Will watch your condition.  Will get help right away if you are not  doing well or get worse. Document Released: 12/30/2003 Document Revised: 06/25/2011 Document Reviewed: 01/12/2013 [facility] Patient Information 2014 [place], [place].   Abdominal Pain, Adult Many things can cause abdominal pain. Usually, abdominal pain is not caused by a disease and will improve without treatment. It can often be observed and treated at home. Your  health care provider will do a physical exam and possibly order blood tests and X-rays to help determine the seriousness of your pain. However, in many cases, more time must pass before a clear cause of the pain can be found. Before that point, your health care provider may not know if you need more testing or further treatment. HOME CARE INSTRUCTIONS  Monitor your abdominal pain for any changes. The following actions may help to alleviate any discomfort you are experiencing:  Only take over-the-counter or prescription medicines as directed by your health care provider.  Do not take laxatives unless directed to do so by your health care provider.  Try a clear liquid diet (broth, tea, or water) as directed by your health care provider. Slowly move to a bland diet as tolerated. SEEK MEDICAL CARE IF:  You have unexplained abdominal pain.  You have abdominal pain associated with nausea or diarrhea.  You have pain when you urinate or have a bowel movement.  You experience abdominal pain that wakes you in the night.  You have abdominal pain that is worsened or improved by eating food.  You have abdominal pain that is worsened with eating fatty foods. SEEK IMMEDIATE MEDICAL CARE IF:   Your pain does not go away within 2 hours.  You have a fever.  You keep throwing up (vomiting).  Your pain is felt only in portions of the abdomen, such as the right side or the left lower portion of the abdomen.  You pass bloody or black tarry stools. MAKE SURE YOU:  Understand these instructions.   Will watch your condition.   Will get help right away if you are not doing well or get worse.  Document Released: 01/10/2005 Document Revised: 01/21/2013 Document Reviewed: 12/10/2012 [facility] Patient Information 2014 [place].

## 2013-09-26 NOTE — ED Provider Notes (Signed)
Medical screening examination/treatment/procedure(s) were performed by non-physician practitioner and as supervising physician I was immediately available for consultation/collaboration.   EKG Interpretation None       Jasper Riling. Alvino Chapel, MD 09/26/13 [phone]

## 2013-10-06 ENCOUNTER — Ambulatory Visit: Payer: Medicare Other | Admitting: Gastroenterology

## 2013-12-09 ENCOUNTER — Ambulatory Visit (INDEPENDENT_AMBULATORY_CARE_PROVIDER_SITE_OTHER): Payer: Commercial Managed Care - HMO | Admitting: Gastroenterology

## 2013-12-09 ENCOUNTER — Other Ambulatory Visit (INDEPENDENT_AMBULATORY_CARE_PROVIDER_SITE_OTHER): Payer: Commercial Managed Care - HMO

## 2013-12-09 ENCOUNTER — Encounter: Payer: Self-pay | Admitting: Gastroenterology

## 2013-12-09 VITALS — BP 110/60 | HR 60 | Ht 62.0 in | Wt 131.4 lb

## 2013-12-09 DIAGNOSIS — R1084 Generalized abdominal pain: Secondary | ICD-10-CM

## 2013-12-09 DIAGNOSIS — Z860100 Personal history of colon polyps, unspecified: Secondary | ICD-10-CM | POA: Insufficient documentation

## 2013-12-09 DIAGNOSIS — R1032 Left lower quadrant pain: Secondary | ICD-10-CM

## 2013-12-09 DIAGNOSIS — R1310 Dysphagia, unspecified: Secondary | ICD-10-CM

## 2013-12-09 LAB — CBC WITH DIFFERENTIAL/PLATELET
Basophils Absolute: 0.1 10*3/uL (ref 0.0–0.1)
Basophils Relative: 0.7 % (ref 0.0–3.0)
Eosinophils Absolute: 0.1 10*3/uL (ref 0.0–0.7)
Eosinophils Relative: 1.1 % (ref 0.0–5.0)
HCT: 43.2 % (ref 36.0–46.0)
Hemoglobin: 14.4 g/dL (ref 12.0–15.0)
Lymphocytes Relative: 38 % (ref 12.0–46.0)
Lymphs Abs: 4 10*3/uL (ref 0.7–4.0)
MCHC: 33.3 g/dL (ref 30.0–36.0)
MCV: 90.9 fl (ref 78.0–100.0)
Monocytes Absolute: 0.8 10*3/uL (ref 0.1–1.0)
Monocytes Relative: 7.2 % (ref 3.0–12.0)
Neutro Abs: 5.6 10*3/uL (ref 1.4–7.7)
Neutrophils Relative %: 53 % (ref 43.0–77.0)
Platelets: 358 10*3/uL (ref 150.0–400.0)
RBC: 4.75 Mil/uL (ref 3.87–5.11)
RDW: 13.7 % (ref 11.5–15.5)
WBC: 10.6 10*3/uL — ABNORMAL HIGH (ref 4.0–10.5)

## 2013-12-09 LAB — COMPREHENSIVE METABOLIC PANEL
ALT: 16 U/L (ref 0–35)
AST: 18 U/L (ref 0–37)
Albumin: 3.8 g/dL (ref 3.5–5.2)
Alkaline Phosphatase: 87 U/L (ref 39–117)
BUN: 5 mg/dL — ABNORMAL LOW (ref 6–23)
CO2: 24 mEq/L (ref 19–32)
Calcium: 9.7 mg/dL (ref 8.4–10.5)
Chloride: 102 mEq/L (ref 96–112)
Creatinine, Ser: 0.6 mg/dL (ref 0.4–1.2)
GFR: 118.27 mL/min (ref >60.00)
Glucose, Bld: 91 mg/dL (ref 70–99)
Potassium: 3.5 mEq/L (ref 3.5–5.1)
Sodium: 140 mEq/L (ref 135–145)
Total Bilirubin: 0.6 mg/dL (ref 0.2–1.2)
Total Protein: 7.7 g/dL (ref 6.0–8.3)

## 2013-12-09 LAB — HEPATIC FUNCTION PANEL
ALT: 16 U/L (ref 0–35)
AST: 18 U/L (ref 0–37)
Albumin: 3.8 g/dL (ref 3.5–5.2)
Alkaline Phosphatase: 87 U/L (ref 39–117)
Bilirubin, Direct: 0.1 mg/dL (ref 0.0–0.3)
Total Bilirubin: 0.6 mg/dL (ref 0.2–1.2)
Total Protein: 7.7 g/dL (ref 6.0–8.3)

## 2013-12-09 LAB — AMYLASE: Amylase: 44 U/L (ref 27–131)

## 2013-12-09 MED ORDER — HYOSCYAMINE SULFATE ER 0.375 MG PO TBCR: EXTENDED_RELEASE_TABLET | ORAL | Status: DC

## 2013-12-09 NOTE — Progress Notes (Signed)
_                                                                                                                History of Present Illness:  [name] is a 68 year old Afro-American female referred for evaluation of abdominal pain.  For the past month she's been complaining of bilateral lower abdominal pain.  Pain is increased over the past 2 weeks.  Within the past 2 weeks she's noted black stools that are semi-formed.  She occasionally has seen drops of blood.  She denies fever.  She's lost about 20 pounds over the past 2-3 months.  CT scan, by report, in may, 2015 showed diverticulosis only.  This was done for similar complaints.  Adenomatous polyps were removed in 2009 and an additional 3 polyps were removed in 2012.  Patient has a history of hemorrhoids for which she underwent band ligation in 2012.   Past Medical History  Diagnosis Date  . DM (diabetes mellitus)   . HTN (hypertension)   . Chronic back pain   . Asthma   . Irritable bowel syndrome (IBS)   . Low blood potassium   . Obese   . GERD (gastroesophageal reflux disease)   . Anal fissure   . Diverticulosis of colon (without mention of hemorrhage) 2009    Colonoscopy  . Hemorrhoids   . Abdominal pain   . Hernia   . Arthritis   . Hyperlipidemia     low  . Colon adenoma 2009, 2012    Colonoscopy  . Stroke     x2   Past Surgical History  Procedure Laterality Date  . Partial hysterectomy  1971  . Tonsillectomy    . Flexible sigmoidoscopy N/A 07/14/2012    Procedure: FLEXIBLE SIGMOIDOSCOPY;  Surgeon: Inda Castle, MD;  Location: [facility];  Service: Endoscopy;  Laterality: N/A;  . Hemorrhoid banding N/A 07/14/2012    Procedure: HEMORRHOID BANDING;  Surgeon: Inda Castle, MD;  Location: [facility];  Service: Endoscopy;  Laterality: N/A;   family history includes Prostate cancer in her brother. There is no history of Colon cancer. Current Outpatient Prescriptions  Medication Sig Dispense  Refill  . Albuterol Sulfate (PROAIR HFA IN) Inhale 2 puffs into the lungs every 4 (four) hours as needed (sob).       . Cholecalciferol (VITAMIN D3) 1000 UNITS CAPS Take 1 capsule by mouth daily.      Marland Kitchen glimepiride (AMARYL) 4 MG tablet Take 4 mg by mouth daily.       Marland Kitchen glucose blood test strip 1 each by Other route as needed. Use as instructed       . ibuprofen (ADVIL,MOTRIN) 200 MG tablet Take 400 mg by mouth every 8 (eight) hours as needed.       . mineral oil enema Place 133 mLs (1 enema total) rectally once.  133 mL  0   No current facility-administered medications for this visit.   Allergies as of 12/09/2013 - Review Complete 12/09/2013  Allergen  Reaction Noted  . Codeine Itching 12/22/2010  . Penicillins Itching 12/22/2010    reports that she has been smoking Cigarettes.  She has been smoking about 0.25 packs per day. She has never used smokeless tobacco. She reports that she drinks about 12.6 ounces of alcohol per week. She reports that she does not use illicit drugs.   Review of Systems: Pertinent positive and negative review of systems were noted in the above HPI section. All other review of systems were otherwise negative.  Vital signs were reviewed in today's medical record Physical Exam: General: Well developed , well nourished, no acute distress Skin: anicteric Head: Normocephalic and atraumatic Eyes:  sclerae anicteric, EOMI Ears: Normal auditory acuity Mouth: No deformity or lesions Neck: Supple, no masses or thyromegaly Lungs: Clear throughout to auscultation Heart: Regular rate and rhythm; no murmurs, rubs or bruits Abdomen: Soft,  and non distended. No masses, hepatosplenomegaly or hernias noted. Normal Bowel sounds.  There is mild diffuse pelvic tenderness in the right periumbilical and left lower quadrants without guarding or rebound Rectal: There are no masses.  Stool is brown and Hemoccult negative Musculoskeletal: Symmetrical with no gross deformities  Skin:  No lesions on visible extremities Pulses:  Normal pulses noted Extremities: No clubbing, cyanosis, edema or deformities noted Neurological: Alert oriented x 4, grossly nonfocal Cervical Nodes:  No significant cervical adenopathy Inguinal Nodes: No significant inguinal adenopathy Psychological:  Alert and cooperative. Normal mood and affect  See Assessment and Plan under Problem List

## 2013-12-09 NOTE — Assessment & Plan Note (Signed)
Patient is due for followup colonoscopy.  This will be deferred until the issue of abdominal pain has been resolved.

## 2013-12-09 NOTE — Patient Instructions (Addendum)
You have been scheduled for a CT scan of the abdomen and pelvis at [facility] ([address]---this is in the same building as Press [facility]).   You are scheduled on 12/11/2013 at 2:30pm. You should arrive 15 minutes prior to your appointment time for registration. Please follow the written instructions below on the day of your exam:  WARNING: IF YOU ARE ALLERGIC TO IODINE/X-RAY DYE, PLEASE NOTIFY RADIOLOGY IMMEDIATELY AT [phone]! YOU WILL BE GIVEN A 13 HOUR PREMEDICATION PREP.  1) Do not eat or drink anything after 10:30am (4 hours prior to your test) 2) You have been given 2 bottles of oral contrast to drink. The solution may taste               better if refrigerated, but do NOT add ice or any other liquid to this solution. Shake             well before drinking.    Drink 1 bottle of contrast @ 12:30pm (2 hours prior to your exam)  Drink 1 bottle of contrast @ 1:30pm (1 hour prior to your exam)  You may take any medications as prescribed with a small amount of water except for the following: Metformin, Glucophage, Glucovance, Avandamet, Riomet, Fortamet, Actoplus Met, Janumet, Glumetza or Metaglip. The above medications must be held the day of the exam AND 48 hours after the exam.  The purpose of you drinking the oral contrast is to aid in the visualization of your intestinal tract. The contrast solution may cause some diarrhea. Before your exam is started, you will be given a small amount of fluid to drink. Depending on your individual set of symptoms, you may also receive an intravenous injection of x-ray contrast/dye. Plan on being at [facility] for 30 minutes or long, depending on the type of exam you are having performed.  This test typically takes 30-45 minutes to complete.  If you have any questions regarding your exam or if you need to reschedule, you may call the CT department at [phone] between the hours of 8:00 am and 5:00 pm,  Monday-Friday.  ________________________________________________________________________   Go to the basement for labs today Call back in 1 week to report progress  We have scheduled you to follow up in 1 month with Margaret Esterwood,PA on 01/06/2014 Wed at 8:30am

## 2013-12-09 NOTE — Assessment & Plan Note (Addendum)
Recurrent left lower quadrant pain that is worsened over the past 2 weeks and accompanied by black stools.  CT in May was negative except for diverticulosis.  Today's exam was negative for blood.  Symptoms could be do to a low-grade diverticulitis.  Black stools raise the question of GI bleeding although this has not been confirmed.  Small amounts of rectal bleeding may be hemorrhoidal.  Of note patient has lost 20 pounds in the last few months.  She is at evaluation in the past for similar symptoms with a negative workup.  Recommendations #1 check CBC, amylase and LFTs #2 trial of hyomax #3 repeat CT of the abdomen and pelvis

## 2013-12-10 ENCOUNTER — Telehealth: Payer: Self-pay | Admitting: Gastroenterology

## 2013-12-10 MED ORDER — GLYCOPYRROLATE 2 MG PO TABS: 2.0000 mg | ORAL_TABLET | Freq: Two times a day (BID) | ORAL | Status: DC

## 2013-12-10 NOTE — Telephone Encounter (Signed)
This patient states she is in a lot of pain and cant afford Hyoscyamine.. What can we send her Dr Bevelyn Ngo are Doc of the Day

## 2013-12-10 NOTE — Telephone Encounter (Signed)
Called pt to inform.

## 2013-12-10 NOTE — Telephone Encounter (Signed)
I sent a prescription for glycopyrrolate - hopefully she can afford.  Note she was on dicyclomine in past - I did not Rx this because had in past but I know for sure that is only $10 at [facility] if she wants to retry that

## 2013-12-11 ENCOUNTER — Ambulatory Visit (INDEPENDENT_AMBULATORY_CARE_PROVIDER_SITE_OTHER)
Admission: RE | Admit: 2013-12-11 | Discharge: 2013-12-11 | Disposition: A | Discharge status: Home / Self Care | Payer: Commercial Managed Care - HMO | Source: Ambulatory Visit | Attending: Gastroenterology | Admitting: Gastroenterology

## 2013-12-11 DIAGNOSIS — R1084 Generalized abdominal pain: Secondary | ICD-10-CM

## 2013-12-11 MED ORDER — IOHEXOL 300 MG/ML  SOLN
100.0000 mL | Freq: Once | INTRAMUSCULAR | Status: AC | PRN
  Administered 2013-12-11: 100 mL via INTRAVENOUS

## 2013-12-14 ENCOUNTER — Encounter: Payer: Self-pay | Admitting: Gastroenterology

## 2013-12-14 ENCOUNTER — Other Ambulatory Visit: Payer: Self-pay

## 2013-12-14 ENCOUNTER — Telehealth: Payer: Self-pay

## 2013-12-14 DIAGNOSIS — G8929 Other chronic pain: Secondary | ICD-10-CM

## 2013-12-14 DIAGNOSIS — R1031 Right lower quadrant pain: Principal | ICD-10-CM

## 2013-12-14 NOTE — Telephone Encounter (Signed)
Patient is aware. Very tearful. Appointments made and explained to the patient. Also given address and phone number because she was concerned that she would forget.

## 2013-12-14 NOTE — Telephone Encounter (Signed)
Both for evaluation of pain and for screening

## 2013-12-14 NOTE — Progress Notes (Signed)
Quick Note:  Please inform the patient that lab work and CT were normal. She should continue hyomax if it is helping. She needs colo. ______

## 2013-12-14 NOTE — Telephone Encounter (Signed)
Spoke with the patient who states the Robinul does not help with her abdominal pain. She is scheduled for a recall on the colonoscopy. To clarify, am I scheduling this for reasons other than screening?

## 2013-12-14 NOTE — Telephone Encounter (Signed)
Message copied by Greggory Keen on Mon Dec 14, 2013  9:26 AM ------      Message from: Erskine Emery D      Created: Mon Dec 14, 2013  8:22 AM       Please inform the patient that lab work and CT were normal.  She should continue hyomax if it is helping.      She needs colo. ------

## 2013-12-18 ENCOUNTER — Ambulatory Visit (AMBULATORY_SURGERY_CENTER): Payer: Self-pay | Admitting: *Deleted

## 2013-12-18 VITALS — Ht 62.0 in | Wt 132.2 lb

## 2013-12-18 DIAGNOSIS — Z86010 Personal history of colonic polyps: Secondary | ICD-10-CM

## 2013-12-18 MED ORDER — NA SULFATE-K SULFATE-MG SULF 17.5-3.13-1.6 GM/177ML PO SOLN: 1.0000 | Freq: Once | ORAL | Status: DC

## 2013-12-18 NOTE — Progress Notes (Signed)
No egg or soy allergy. ewm No home 02 use. ewm Last colon with dr Deatra Ina. emw

## 2013-12-25 ENCOUNTER — Ambulatory Visit (AMBULATORY_SURGERY_CENTER): Payer: Commercial Managed Care - HMO | Admitting: Gastroenterology

## 2013-12-25 ENCOUNTER — Encounter: Payer: Self-pay | Admitting: Gastroenterology

## 2013-12-25 VITALS — BP 178/80 | HR 67 | Temp 97.6°F | Resp 15 | Ht 62.0 in | Wt 132.0 lb

## 2013-12-25 DIAGNOSIS — Z86010 Personal history of colonic polyps: Secondary | ICD-10-CM | POA: Diagnosis not present

## 2013-12-25 DIAGNOSIS — D126 Benign neoplasm of colon, unspecified: Secondary | ICD-10-CM

## 2013-12-25 DIAGNOSIS — K5730 Diverticulosis of large intestine without perforation or abscess without bleeding: Secondary | ICD-10-CM

## 2013-12-25 DIAGNOSIS — K648 Other hemorrhoids: Secondary | ICD-10-CM

## 2013-12-25 MED ORDER — HYOSCYAMINE SULFATE ER 0.375 MG PO TBCR: EXTENDED_RELEASE_TABLET | ORAL | Status: DC

## 2013-12-25 MED ORDER — SODIUM CHLORIDE 0.9 % IV SOLN: 500.0000 mL | INTRAVENOUS | Status: DC

## 2013-12-25 NOTE — Op Note (Signed)
[place]  [address] [place], [id number]   COLONOSCOPY PROCEDURE REPORT  PATIENT: Margaret Rangel, Margaret Rangel  MR#: [id number] BIRTHDATE: 1946/03/18 , 68  yrs. old GENDER: Female ENDOSCOPIST: Inda Castle, MD REFERRED [place]:OVZCH Jeanie Cooks, M.D. PROCEDURE DATE:  12/25/2013 PROCEDURE:   Colonoscopy with snare polypectomy and Colonoscopy with cold biopsy polypectomy First Screening Colonoscopy - Avg.  risk and is 50 yrs.  old or older - No.  Prior Negative Screening - Now for repeat screening. N/A  History of Adenoma - Now for follow-up colonoscopy & has been > or = to 3 yrs.  Yes hx of adenoma.  Has been 3 or more years since last colonoscopy.  Polyps Removed Today? Yes. ASA CLASS:   Class II INDICATIONS:abdominal pain in the lower left quadrant and Patient's personal history of colon polyps 2009, 2012 MEDICATIONS: MAC sedation, administered by CRNA and propofol (Diprivan) 300mg  IV  DESCRIPTION OF PROCEDURE:   After the risks benefits and alternatives of the procedure were thoroughly explained, informed consent was obtained.  A digital rectal exam revealed no abnormalities of the rectum.   The LB [id number]  endoscope was introduced through the anus and advanced to the cecum, which was identified by both the appendix and ileocecal valve. No adverse events experienced.   The quality of the prep was Suprep good  The instrument was then slowly withdrawn as the colon was fully examined.      COLON FINDINGS: Seven sessile polyps were found. ranging from 2-5 mm.  22 mm polyps were removed with cold biopsy forceps.  The remaining 5 polyps were removed with cold polypectomy snare.    The resection was complete and the polyp tissue was completely retrieved.   A sessile polyp measuring 12 mm in size was found in the descending colon.  A polypectomy was performed using snare cautery.  The resection was complete and the polyp tissue was completely retrieved.    Moderate diverticulosis was noted in the sigmoid colon.   Internal hemorrhoids were found.  Retroflexed views revealed no abnormalities. The time to cecum=2 minutes 52 seconds.  Withdrawal time=16 minutes 15 seconds.  The scope was withdrawn and the procedure completed. COMPLICATIONS: There were no complications.  ENDOSCOPIC IMPRESSION: 1.   colonic polyposis 2.   Moderate diverticulosis was noted in the sigmoid colon 3.   Internal hemorrhoids  RECOMMENDATIONS: 1.  hyomax as needed, abdominal pain 2.  colonoscopy 3 years  eSigned:  Inda Castle, MD 12/25/2013 11:28 AM   cc:   PATIENT NAME:  Margaret Rangel, Margaret Rangel MR#: [id number]

## 2013-12-25 NOTE — Progress Notes (Signed)
Called to room to assist during endoscopic procedure.  Patient ID and intended procedure confirmed with present staff. Received instructions for my participation in the procedure from the performing physician.  

## 2013-12-25 NOTE — Patient Instructions (Addendum)
No NSAIDs for 2 weeks  YOU HAD AN ENDOSCOPIC PROCEDURE TODAY AT [facility]: Refer to the procedure report that was given to you for any specific questions about what was found during the examination.  If the procedure report does not answer your questions, please call your gastroenterologist to clarify.  If you requested that your care partner not be given the details of your procedure findings, then the procedure report has been included in a sealed envelope for you to review at your convenience later.  YOU SHOULD EXPECT: Some feelings of bloating in the abdomen. Passage of more gas than usual.  Walking can help get rid of the air that was put into your GI tract during the procedure and reduce the bloating. If you had a lower endoscopy (such as a colonoscopy or flexible sigmoidoscopy) you may notice spotting of blood in your stool or on the toilet paper. If you underwent a bowel prep for your procedure, then you may not have a normal bowel movement for a few days.  DIET: Your first meal following the procedure should be a light meal and then it is ok to progress to your normal diet.  A half-sandwich or bowl of soup is an example of a good first meal.  Heavy or fried foods are harder to digest and may make you feel nauseous or bloated.  Likewise meals heavy in dairy and vegetables can cause extra gas to form and this can also increase the bloating.  Drink plenty of fluids but you should avoid alcoholic beverages for 24 hours.  ACTIVITY: Your care partner should take you home directly after the procedure.  You should plan to take it easy, moving slowly for the rest of the day.  You can resume normal activity the day after the procedure however you should NOT DRIVE or use heavy machinery for 24 hours (because of the sedation medicines used during the test).    SYMPTOMS TO REPORT IMMEDIATELY: A gastroenterologist can be reached at any hour.  During normal business hours, 8:30 AM to 5:00 PM  Monday through Friday, call [phone].  After hours and on weekends, please call the GI answering service at [phone] who will take a message and have the physician on call contact you.   Following lower endoscopy (colonoscopy or flexible sigmoidoscopy):  Excessive amounts of blood in the stool  Significant tenderness or worsening of abdominal pains  Swelling of the abdomen that is new, acute  Fever of 100F or higher   FOLLOW UP: If any biopsies were taken you will be contacted by phone or by letter within the next 1-3 weeks.  Call your gastroenterologist if you have not heard about the biopsies in 3 weeks.  Our staff will call the home number listed on your records the next business day following your procedure to check on you and address any questions or concerns that you may have at that time regarding the information given to you following your procedure. This is a courtesy call and so if there is no answer at the home number and we have not heard from you through the emergency physician on call, we will assume that you have returned to your regular daily activities without incident.  SIGNATURES/CONFIDENTIALITY: You and/or your care partner have signed paperwork which will be entered into your electronic medical record.  These signatures attest to the fact that that the information above on your After Visit Summary has been reviewed and is understood.  Full responsibility of the confidentiality of this discharge information lies with you and/or your care-partner.  Polyps, diverticulosis, high fiber diet, hemorrhoids-handouts given  Repeat colonoscopy in 3 years-2018.  Hyomax as needed for abdominal pain.

## 2013-12-25 NOTE — Progress Notes (Signed)
Procedure ends, to recovery, report given and VSS. 

## 2013-12-28 ENCOUNTER — Telehealth: Payer: Self-pay

## 2013-12-28 NOTE — Telephone Encounter (Signed)
Reminded the patient that I will need to speak with Dr Deatra Ina and respond to her on 12/30/13. It is her complaint of abdominal discomfort that by her own words is long standing. She reports nausea and vomiting with Robinul. Hycomax is not covered by her insurance and it is too expensive for her out of pocket.Please advise of options for this patient.

## 2013-12-28 NOTE — Telephone Encounter (Signed)
Patient reports the Robinul 2 mg causes her to be nauseated and therefore she is not taking it. Hycomax is non-formulary. She was on dicyclomine in past but I cannot find why is wasn't refilled. She is looking for a medication option for her abdominal pain. Please advise.

## 2013-12-28 NOTE — Telephone Encounter (Signed)
Please see the note. What options do you have for this patient?

## 2013-12-28 NOTE — Telephone Encounter (Signed)
  Follow up Call-  Call back number 12/25/2013 09/30/2012 08/28/2012  Post procedure Call Back phone  # [phone]  Permission to leave phone message Yes Yes Yes     Patient questions:  Do you have a fever, pain , or abdominal swelling? Yes.   Pain Score  6 *  Have you tolerated food without any problems? Yes.    Have you been able to return to your normal activities? Yes.    Do you have any questions about your discharge instructions: Diet   No. Medications  Yes.   Follow up visit  No.  Do you have questions or concerns about your Care? No.  Actions: * If pain score is 4 or above: No action needed, pain <4.  Patient stated medication hyomax was to expensive. Need to call in a cheaper medicine for her abdominal pain.states her side hurts really bad. Unable to lay down due to pain. Patient was seen due to her abdominal pain.

## 2013-12-28 NOTE — Telephone Encounter (Signed)
It's not urgent. Just check with Dr. Deatra Ina when he returns. Thanks

## 2013-12-28 NOTE — Telephone Encounter (Signed)
Try bentyl 10 mg qid prn

## 2013-12-29 ENCOUNTER — Other Ambulatory Visit: Payer: Self-pay

## 2013-12-29 MED ORDER — DICYCLOMINE HCL 10 MG PO CAPS: 10.0000 mg | ORAL_CAPSULE | Freq: Three times a day (TID) | ORAL | Status: DC

## 2013-12-29 NOTE — Telephone Encounter (Signed)
Patient notified Rx sent to her pharmacy.

## 2013-12-30 ENCOUNTER — Encounter: Payer: Self-pay | Admitting: Gastroenterology

## 2014-01-06 ENCOUNTER — Ambulatory Visit: Payer: Commercial Managed Care - HMO | Admitting: Physician Assistant

## 2014-01-06 ENCOUNTER — Encounter: Payer: Self-pay | Admitting: *Deleted

## 2014-01-06 NOTE — Progress Notes (Signed)
Patient ID: Margaret Rangel, female   DOB: 03-01-1946, 67 y.o.   MRN: [id number] The patient's chart has been reviewed by Nicoletta Ba PA-C and the recommendations are noted below.  Per Nicoletta Ba PA-C: If the patient is having no GI problems since her Colonoscopy, she can call us on an as needed basis.   Outcome of communication with the patient: I asked her how she was doing since her colonoscopy on 12-25-2013.  She is feeling fine regarding her  Gastro health.  She is having pain where her ovaries are and she made an appointment with her primary care doctor.  Her appointment is today, 01-06-2014. She is going to talk to them about referring her to a Gyn MD.  I advised her to call us if she has any GI problems.

## 2014-01-31 ENCOUNTER — Emergency Department (HOSPITAL_COMMUNITY): Payer: Medicare HMO

## 2014-01-31 ENCOUNTER — Emergency Department (HOSPITAL_COMMUNITY)
Admission: EM | Admit: 2014-01-31 | Discharge: 2014-01-31 | Disposition: A | Discharge status: Home / Self Care | Payer: Medicare HMO | Attending: Emergency Medicine | Admitting: Emergency Medicine

## 2014-01-31 ENCOUNTER — Encounter (HOSPITAL_COMMUNITY): Payer: Self-pay | Admitting: Emergency Medicine

## 2014-01-31 DIAGNOSIS — Z9889 Other specified postprocedural states: Secondary | ICD-10-CM | POA: Insufficient documentation

## 2014-01-31 DIAGNOSIS — K219 Gastro-esophageal reflux disease without esophagitis: Secondary | ICD-10-CM | POA: Diagnosis not present

## 2014-01-31 DIAGNOSIS — E669 Obesity, unspecified: Secondary | ICD-10-CM | POA: Insufficient documentation

## 2014-01-31 DIAGNOSIS — R1032 Left lower quadrant pain: Secondary | ICD-10-CM | POA: Diagnosis not present

## 2014-01-31 DIAGNOSIS — Z720 Tobacco use: Secondary | ICD-10-CM | POA: Diagnosis not present

## 2014-01-31 DIAGNOSIS — G8929 Other chronic pain: Secondary | ICD-10-CM | POA: Diagnosis not present

## 2014-01-31 DIAGNOSIS — J45909 Unspecified asthma, uncomplicated: Secondary | ICD-10-CM | POA: Insufficient documentation

## 2014-01-31 DIAGNOSIS — Z880 Allergy status to penicillin: Secondary | ICD-10-CM | POA: Insufficient documentation

## 2014-01-31 DIAGNOSIS — I10 Essential (primary) hypertension: Secondary | ICD-10-CM | POA: Diagnosis not present

## 2014-01-31 DIAGNOSIS — Z79899 Other long term (current) drug therapy: Secondary | ICD-10-CM | POA: Insufficient documentation

## 2014-01-31 DIAGNOSIS — Z8739 Personal history of other diseases of the musculoskeletal system and connective tissue: Secondary | ICD-10-CM | POA: Diagnosis not present

## 2014-01-31 DIAGNOSIS — Z8673 Personal history of transient ischemic attack (TIA), and cerebral infarction without residual deficits: Secondary | ICD-10-CM | POA: Insufficient documentation

## 2014-01-31 DIAGNOSIS — Z90711 Acquired absence of uterus with remaining cervical stump: Secondary | ICD-10-CM | POA: Diagnosis not present

## 2014-01-31 DIAGNOSIS — K625 Hemorrhage of anus and rectum: Secondary | ICD-10-CM | POA: Diagnosis present

## 2014-01-31 DIAGNOSIS — E119 Type 2 diabetes mellitus without complications: Secondary | ICD-10-CM | POA: Insufficient documentation

## 2014-01-31 LAB — CBC WITH DIFFERENTIAL/PLATELET
Basophils Absolute: 0 10*3/uL (ref 0.0–0.1)
Basophils Relative: 0 % (ref 0–1)
Eosinophils Absolute: 0.1 10*3/uL (ref 0.0–0.7)
Eosinophils Relative: 2 % (ref 0–5)
HCT: 39.6 % (ref 36.0–46.0)
Hemoglobin: 13.4 g/dL (ref 12.0–15.0)
Lymphocytes Relative: 35 % (ref 12–46)
Lymphs Abs: 2.9 10*3/uL (ref 0.7–4.0)
MCH: 30 pg (ref 26.0–34.0)
MCHC: 33.8 g/dL (ref 30.0–36.0)
MCV: 88.6 fL (ref 78.0–100.0)
Monocytes Absolute: 0.7 10*3/uL (ref 0.1–1.0)
Monocytes Relative: 9 % (ref 3–12)
Neutro Abs: 4.7 10*3/uL (ref 1.7–7.7)
Neutrophils Relative %: 54 % (ref 43–77)
Platelets: 350 10*3/uL (ref 150–400)
RBC: 4.47 MIL/uL (ref 3.87–5.11)
RDW: 13 % (ref 11.5–15.5)
WBC: 8.5 10*3/uL (ref 4.0–10.5)

## 2014-01-31 LAB — COMPREHENSIVE METABOLIC PANEL
ALT: 15 U/L (ref 0–35)
AST: 21 U/L (ref 0–37)
Albumin: 3.7 g/dL (ref 3.5–5.2)
Alkaline Phosphatase: 86 U/L (ref 39–117)
Anion gap: 16 — ABNORMAL HIGH (ref 5–15)
BUN: 6 mg/dL (ref 6–23)
CO2: 22 mEq/L (ref 19–32)
Calcium: 9.2 mg/dL (ref 8.4–10.5)
Chloride: 106 mEq/L (ref 96–112)
Creatinine, Ser: 0.67 mg/dL (ref 0.50–1.10)
GFR calc Af Amer: >90 mL/min (ref >90)
GFR calc non Af Amer: 88 mL/min — ABNORMAL LOW (ref >90)
Glucose, Bld: 114 mg/dL — ABNORMAL HIGH (ref 70–99)
Potassium: 3.8 mEq/L (ref 3.7–5.3)
Sodium: 144 mEq/L (ref 137–147)
Total Bilirubin: <0.2 mg/dL — ABNORMAL LOW (ref 0.3–1.2)
Total Protein: 6.9 g/dL (ref 6.0–8.3)

## 2014-01-31 LAB — POC OCCULT BLOOD, ED: Fecal Occult Bld: POSITIVE — AB

## 2014-01-31 LAB — I-STAT CG4 LACTIC ACID, ED: Lactic Acid, Venous: 2.39 mmol/L — ABNORMAL HIGH (ref 0.5–2.2)

## 2014-01-31 MED ORDER — HYDROMORPHONE HCL 1 MG/ML IJ SOLN
1.0000 mg | Freq: Once | INTRAMUSCULAR | Status: AC
  Administered 2014-01-31: 1 mg via INTRAVENOUS
  Filled 2014-01-31: qty 1

## 2014-01-31 MED ORDER — IOHEXOL 300 MG/ML  SOLN
100.0000 mL | Freq: Once | INTRAMUSCULAR | Status: AC | PRN
  Administered 2014-01-31: 100 mL via INTRAVENOUS

## 2014-01-31 MED ORDER — OXYCODONE-ACETAMINOPHEN 5-325 MG PO TABS: 1.0000 | ORAL_TABLET | Freq: Four times a day (QID) | ORAL | Status: DC | PRN

## 2014-01-31 MED ORDER — SODIUM CHLORIDE 0.9 % IV SOLN
Freq: Once | INTRAVENOUS | Status: AC
  Administered 2014-01-31: 10:00:00 via INTRAVENOUS

## 2014-01-31 MED ORDER — ONDANSETRON HCL 4 MG/2ML IJ SOLN
4.0000 mg | Freq: Once | INTRAMUSCULAR | Status: AC
  Administered 2014-01-31: 4 mg via INTRAVENOUS
  Filled 2014-01-31: qty 2

## 2014-01-31 MED ORDER — IOHEXOL 300 MG/ML  SOLN
50.0000 mL | Freq: Once | INTRAMUSCULAR | Status: AC | PRN
  Administered 2014-01-31: 50 mL via ORAL

## 2014-01-31 MED ORDER — FENTANYL CITRATE 0.05 MG/ML IJ SOLN
50.0000 ug | Freq: Once | INTRAMUSCULAR | Status: AC
  Administered 2014-01-31: 50 ug via INTRAVENOUS
  Filled 2014-01-31: qty 2

## 2014-01-31 NOTE — ED Notes (Signed)
Patient transported to CT 

## 2014-01-31 NOTE — ED Notes (Signed)
Has hx of abd pain in LLQ with constipation,  Diverticulitis, and hemorrhoids.

## 2014-01-31 NOTE — ED Notes (Signed)
Bed: YI01 Expected date: 01/31/14 Expected time: 8:47 AM Means of arrival: Ambulance Comments: Rectal Bleed

## 2014-01-31 NOTE — ED Notes (Addendum)
EMS states pt told them she went to the bathroom this morning and saw dark red blood in stool, the second time more of a bright red, EMS witnessed bright red. Abd pain and tenderness, Abd soft with + bowel sounds. No other symptoms. History of hemorrhoids, 2-3 weeks ago colonoscopy with polyps removed and sent for testing, which were good. Pt later reports two hard bowel movement with straining last night

## 2014-01-31 NOTE — ED Provider Notes (Signed)
CSN: [id number]     Arrival date & time 01/31/14  2440 History   First MD Initiated Contact with Patient 01/31/14 [phone]     Chief Complaint  Patient presents with  . Rectal Bleeding     (Consider location/radiation/quality/duration/timing/severity/associated sxs/prior Treatment) HPI Patient presents with concerns left lower quadrant abdominal pain, bloody stool. Plastic patient has had multiple episodes of red blood per, as well as an pain in the left lower quadrant. Patient notes that she's had similar pain in the past, though not as severe.  On the pain is sharp, severe, no meds, nonradiating. Blood has been present both with and without stool. No concurrent lightheadedness, syncope, chest pain, dyspnea. Patient has had evaluation for similar concerns in the past, including recent colonoscopy, ultrasound.  Past Medical History  Diagnosis Date  . HTN (hypertension)   . Chronic back pain   . Asthma   . Irritable bowel syndrome (IBS)   . Low blood potassium   . Obese   . GERD (gastroesophageal reflux disease)   . Anal fissure   . Diverticulosis of colon (without mention of hemorrhage) 2009    Colonoscopy  . Hemorrhoids   . Abdominal pain   . Hernia   . Arthritis   . Hyperlipidemia     low  . Colon adenoma 2009, 2012    Colonoscopy  . Stroke     x2  . Ulcer   . DM (diabetes mellitus)     off medicines for diabetes 12-18-13   Past Surgical History  Procedure Laterality Date  . Partial hysterectomy  1971  . Tonsillectomy    . Flexible sigmoidoscopy N/A 07/14/2012    Procedure: FLEXIBLE SIGMOIDOSCOPY;  Surgeon: Inda Castle, MD;  Location: [facility];  Service: Endoscopy;  Laterality: N/A;  . Hemorrhoid banding N/A 07/14/2012    Procedure: HEMORRHOID BANDING;  Surgeon: Inda Castle, MD;  Location: [facility];  Service: Endoscopy;  Laterality: N/A;  . Colonoscopy    . Polypectomy     Family History  Problem Relation Age of Onset  . Prostate cancer Brother   .  Heart disease Mother   . Anuerysm Father   . Lung cancer Brother   . Colon cancer Brother   . Rectal cancer Neg Hx   . Stomach cancer Neg Hx    History  Substance Use Topics  . Smoking status: Current Some Day Smoker -- 0.25 packs/day    Types: Cigarettes  . Smokeless tobacco: Never Used  . Alcohol Use: 12.6 oz/week    21 Cans of beer per week   OB History   Grav Para Term Preterm Abortions TAB SAB Ect Mult Living                 Review of Systems  Constitutional:       Per HPI, otherwise negative  HENT:       Per HPI, otherwise negative  Respiratory:       Per HPI, otherwise negative  Cardiovascular:       Per HPI, otherwise negative  Gastrointestinal: Positive for abdominal pain and blood in stool. Negative for vomiting and diarrhea.  Endocrine:       Negative aside from HPI  Genitourinary:       Neg aside from HPI   Musculoskeletal:       Per HPI, otherwise negative  Skin: Negative.   Neurological: Negative for syncope.      Allergies  Codeine and Penicillins  Home Medications  Prior to Admission medications   Medication Sig Start Date End Date Taking? Authorizing Provider  Albuterol Sulfate (PROAIR HFA IN) Inhale 2 puffs into the lungs every 4 (four) hours as needed (sob).    Yes Historical Provider, MD  amLODipine (NORVASC) 5 MG tablet Take 5 mg by mouth daily.   Yes Historical Provider, MD  cholecalciferol (VITAMIN D) 1000 UNITS tablet Take 1,000 Units by mouth daily.   Yes Historical Provider, MD  pantoprazole (PROTONIX) 40 MG tablet Take 40 mg by mouth daily.   Yes Historical Provider, MD  polyethylene glycol (MIRALAX / GLYCOLAX) packet Take 17 g by mouth daily.   Yes Historical Provider, MD   BP 157/60  Pulse 80  Temp(Src) 97.6 F (36.4 C) (Oral)  Resp 18  SpO2 97% Physical Exam  Nursing note and vitals reviewed. Constitutional: She is oriented to person, place, and time. She appears well-developed and well-nourished. No distress.  HENT:   Head: Normocephalic and atraumatic.  Eyes: Conjunctivae and EOM are normal.  Cardiovascular: Normal rate and regular rhythm.   Pulmonary/Chest: Effort normal and breath sounds normal. No stridor. No respiratory distress.  Abdominal: She exhibits no distension. There is no hepatosplenomegaly. There is tenderness in the left lower quadrant. There is rebound and guarding. There is no rigidity.  Musculoskeletal: She exhibits no edema.  Neurological: She is alert and oriented to person, place, and time. No cranial nerve deficit.  Skin: Skin is warm and dry.  Psychiatric: She has a normal mood and affect.    ED Course  Procedures (including critical care time) Labs Review Labs Reviewed  COMPREHENSIVE METABOLIC PANEL - Abnormal; Notable for the following:    Glucose, Bld 114 (*)    Total Bilirubin <0.2 (*)    GFR calc non Af Amer 88 (*)    Anion gap 16 (*)    All other components within normal limits  I-STAT CG4 LACTIC ACID, ED - Abnormal; Notable for the following:    Lactic Acid, Venous 2.39 (*)    All other components within normal limits  POC OCCULT BLOOD, ED - Abnormal; Notable for the following:    Fecal Occult Bld POSITIVE (*)    All other components within normal limits  CBC WITH DIFFERENTIAL    Imaging Review Ct Abdomen Pelvis W Contrast  01/31/2014   CLINICAL DATA:  68 year old with left lower quadrant pain.  EXAM: CT ABDOMEN AND PELVIS WITH CONTRAST  TECHNIQUE: Multidetector CT imaging of the abdomen and pelvis was performed using the standard protocol following bolus administration of intravenous contrast.  CONTRAST:  63mL OMNIPAQUE IOHEXOL 300 MG/ML SOLN, 165mL OMNIPAQUE IOHEXOL 300 MG/ML SOLN  COMPARISON:  12/11/2013  FINDINGS: Lung bases are clear. Negative for free intraperitoneal air. Again noted are sub cm low-density structures throughout the liver parenchyma. Findings are most compatible with a benign etiology such as cysts. Normal appearance of the gallbladder and  portal venous system is patent. Normal appearance of the pancreas, spleen, adrenal glands and both kidneys. Atherosclerotic calcifications in the abdominal aorta without aneurysm. No significant free fluid or lymphadenopathy.  The uterus has been removed. There is fluid in the urinary bladder. Diverticulosis in the sigmoid colon without acute colonic inflammation. Appendix is normal in the right abdomen. Multilevel degenerative disc disease in the lower lumbar spine. The vertebral body heights are maintained.  IMPRESSION: No acute abnormality in the abdomen or pelvis.  Sigmoid colon diverticulosis without acute colonic inflammation.   Electronically Signed   By: Markus Daft  M.D.   On: 01/31/2014 11:18   I reviewed the EMR including recent colonoscopy, pathology report from her polypectomy   12:24 PM Patient sitting upright, in no distress, hemodynamically stable, no visible pain.  Patient will follow up with GI. MDM  Patient presents with concerns of abdominal pain, rectal bleeding. Patient has committed history of both internal hemorrhoids and diverticulosis. Recent colonoscopy could not find malignancy, nor other acute pathology. The patient does have positive Hemoccult status, she is hemodynamically stable, with tachycardia, evidence for decompensation. 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    Carmin Muskrat, MD 01/31/14 1225

## 2014-02-02 ENCOUNTER — Encounter: Payer: Self-pay | Admitting: Physician Assistant

## 2014-02-02 ENCOUNTER — Ambulatory Visit (INDEPENDENT_AMBULATORY_CARE_PROVIDER_SITE_OTHER): Payer: Commercial Managed Care - HMO | Admitting: Physician Assistant

## 2014-02-02 ENCOUNTER — Telehealth: Payer: Self-pay | Admitting: Physician Assistant

## 2014-02-02 VITALS — BP 170/80 | HR 98

## 2014-02-02 DIAGNOSIS — R1032 Left lower quadrant pain: Secondary | ICD-10-CM

## 2014-02-02 DIAGNOSIS — K625 Hemorrhage of anus and rectum: Secondary | ICD-10-CM | POA: Insufficient documentation

## 2014-02-02 DIAGNOSIS — K219 Gastro-esophageal reflux disease without esophagitis: Secondary | ICD-10-CM | POA: Insufficient documentation

## 2014-02-02 DIAGNOSIS — K648 Other hemorrhoids: Secondary | ICD-10-CM

## 2014-02-02 DIAGNOSIS — K602 Anal fissure, unspecified: Secondary | ICD-10-CM | POA: Insufficient documentation

## 2014-02-02 MED ORDER — TRAMADOL HCL 50 MG PO TABS
50.0000 mg | ORAL_TABLET | Freq: Three times a day (TID) | ORAL | Status: DC | PRN
Start: 2014-02-02 — End: 2015-05-15

## 2014-02-02 MED ORDER — DILTIAZEM GEL 2 %: 1.0000 "application " | Freq: Two times a day (BID) | CUTANEOUS | Status: DC

## 2014-02-02 MED ORDER — HYDROCORTISONE ACETATE 25 MG RE SUPP: 25.0000 mg | Freq: Two times a day (BID) | RECTAL | Status: DC

## 2014-02-02 MED ORDER — CIPROFLOXACIN HCL 500 MG PO TABS: 500.0000 mg | ORAL_TABLET | Freq: Two times a day (BID) | ORAL | Status: DC

## 2014-02-02 MED ORDER — POLYETHYLENE GLYCOL 3350 17 GM/SCOOP PO POWD: 1.0000 | Freq: Every day | ORAL | Status: DC

## 2014-02-02 MED ORDER — RANITIDINE HCL 300 MG PO TABS: 300.0000 mg | ORAL_TABLET | Freq: Every day | ORAL | Status: DC

## 2014-02-02 MED ORDER — METRONIDAZOLE 500 MG PO TABS: 500.0000 mg | ORAL_TABLET | Freq: Three times a day (TID) | ORAL | Status: DC

## 2014-02-02 NOTE — Progress Notes (Signed)
Patient ID: Margaret Rangel, female   DOB: 1945-07-13, 68 y.o.   MRN: [id number]     History of Present Illness: Margaret Rangel is a 68 year old African American female who is here today with complaints of left lower quadrant abdominal pain, rectal pain, rectal bleeding. She was last seen here in August with complaints of bilateral lower abdominal pain accompanied by weight loss. She had had a CT scan that showed diverticulitis [place] only. Adenomatous polyps were removed in 2009 and an additional 3 polyps were removed in 2012. The patient also has a history of hemorrhoids for which she underwent band ligation in 2012. She recently had a colonoscopy on 12/25/2013 at which time she was noted to have several polyps which were found to be adenomatous. She was also found to have moderate diverticular disease in the sigmoid. She states that she has been having trouble with hard stools. She has a bowel movement daily but often has to strain. This past Sunday she passed a large hard stool for which she had to strain for several minutes to pass. As the stool past she felt a pop in the left lower quadrant and a tearing sensation in the rectum. She filled the toilet bowl with blood. She was taken to the emergency room via EMS. In the emergency room her CBC was found to be normal. She had an abdominal CT that showed diverticulosis but no acute inflammation. She was sent home on Percocet. She states she took one and it caused her to  itch so she discontinued it. She states in the past hydrocodone has provided minimal relief but tramadol usually helps her pain. She also states that she is having some heartburn in the evening despite using the pantoprazole in the morning.No nausea. No epigastric pain. No tarry stools.   Past Medical History  Diagnosis Date  . HTN (hypertension)   . Chronic back pain   . Asthma   . Irritable bowel syndrome (IBS)   . Low blood potassium   . Obese   . GERD (gastroesophageal reflux  disease)   . Anal fissure   . Diverticulosis of colon (without mention of hemorrhage) 2009    Colonoscopy  . Hemorrhoids   . Abdominal pain   . Hernia   . Arthritis   . Hyperlipidemia     low  . Colon adenoma 2009, 2012    Colonoscopy  . Stroke     x2  . Ulcer   . DM (diabetes mellitus)     off medicines for diabetes 12-18-13    Past Surgical History  Procedure Laterality Date  . Partial hysterectomy  1971  . Tonsillectomy    . Flexible sigmoidoscopy N/A 07/14/2012    Procedure: FLEXIBLE SIGMOIDOSCOPY;  Surgeon: Inda Castle, MD;  Location: [facility];  Service: Endoscopy;  Laterality: N/A;  . Hemorrhoid banding N/A 07/14/2012    Procedure: HEMORRHOID BANDING;  Surgeon: Inda Castle, MD;  Location: [facility];  Service: Endoscopy;  Laterality: N/A;  . Colonoscopy    . Polypectomy     Family History  Problem Relation Age of Onset  . Prostate cancer Brother   . Heart disease Mother   . Anuerysm Father   . Lung cancer Brother   . Colon cancer Brother   . Rectal cancer Neg Hx   . Stomach cancer Neg Hx    History  Substance Use Topics  . Smoking status: Current Some Day Smoker -- 0.25 packs/day    Types: Cigarettes  .  Smokeless tobacco: Never Used  . Alcohol Use: 12.6 oz/week    21 Cans of beer per week   Current Outpatient Prescriptions  Medication Sig Dispense Refill  . Albuterol Sulfate (PROAIR HFA IN) Inhale 2 puffs into the lungs every 4 (four) hours as needed (sob).       Marland Kitchen amLODipine (NORVASC) 5 MG tablet Take 5 mg by mouth daily.      . cholecalciferol (VITAMIN D) 1000 UNITS tablet Take 1,000 Units by mouth daily.      Marland Kitchen oxyCODONE-acetaminophen (PERCOCET/ROXICET) 5-325 MG per tablet Take 1 tablet by mouth every 6 (six) hours as needed for severe pain.  15 tablet  0  . pantoprazole (PROTONIX) 40 MG tablet Take 40 mg by mouth daily.      . ciprofloxacin (CIPRO) 500 MG tablet Take 1 tablet (500 mg total) by mouth 2 (two) times daily.  20 tablet  0  .  diltiazem 2 % GEL Apply 1 application topically 2 (two) times daily.  30 g  0  . hydrocortisone (ANUSOL-HC) 25 MG suppository Place 1 suppository (25 mg total) rectally 2 (two) times daily.  24 suppository  0  . metroNIDAZOLE (FLAGYL) 500 MG tablet Take 1 tablet (500 mg total) by mouth 3 (three) times daily.  30 tablet  0  . polyethylene glycol powder (GLYCOLAX/MIRALAX) powder Take 255 g by mouth daily.  255 g  3  . ranitidine (ZANTAC) 300 MG tablet Take 1 tablet (300 mg total) by mouth at bedtime.  30 tablet  2   No current facility-administered medications for this visit.   Allergies  Allergen Reactions  . Codeine Itching  . Penicillins Itching      Review of Systems: Gen: Denies any fever, chills, sweats CV: Denies chest pain, angina, palpitations, syncope, orthopnea, PND, peripheral edema, and claudication. Resp: Denies dyspnea at rest, dyspnea with exercise, cough, sputum, wheezing, coughing up blood, and pleurisy. GI: Denies vomiting blood, jaundice, and fecal incontinence.   Denies dysphagia or odynophagia. GU : Denies urinary burning, blood in urine MS: Denies joint pain.  Derm: Denies rash, itching, dry skin  Psych: neg Heme: neg Neuro: neg Endo:  neg  LAB RESULTS:  Recent Labs  01/31/14 0915  WBC 8.5  HGB 13.4  HCT 39.6  PLT 350   BMET  Recent Labs  01/31/14 0915  NA 144  K 3.8  CL 106  CO2 22  GLUCOSE 114*  BUN 6  CREATININE 0.67  CALCIUM 9.2   LFT  Recent Labs  01/31/14 0915  PROT 6.9  ALBUMIN 3.7  AST 21  ALT 15  ALKPHOS 86  BILITOT <0.2*    Studies:   Ct Abdomen Pelvis W Contrast  01/31/2014   CLINICAL DATA:  68 year old with left lower quadrant pain.  EXAM: CT ABDOMEN AND PELVIS WITH CONTRAST  TECHNIQUE: Multidetector CT imaging of the abdomen and pelvis was performed using the standard protocol following bolus administration of intravenous contrast.  CONTRAST:  63mL OMNIPAQUE IOHEXOL 300 MG/ML SOLN, 157mL OMNIPAQUE IOHEXOL 300  MG/ML SOLN  COMPARISON:  12/11/2013  FINDINGS: Lung bases are clear. Negative for free intraperitoneal air. Again noted are sub cm low-density structures throughout the liver parenchyma. Findings are most compatible with a benign etiology such as cysts. Normal appearance of the gallbladder and portal venous system is patent. Normal appearance of the pancreas, spleen, adrenal glands and both kidneys. Atherosclerotic calcifications in the abdominal aorta without aneurysm. No significant free fluid or lymphadenopathy.  The uterus  has been removed. There is fluid in the urinary bladder. Diverticulosis in the sigmoid colon without acute colonic inflammation. Appendix is normal in the right abdomen. Multilevel degenerative disc disease in the lower lumbar spine. The vertebral body heights are maintained.  IMPRESSION: No acute abnormality in the abdomen or pelvis.  Sigmoid colon diverticulosis without acute colonic inflammation.   Electronically Signed   By: Markus Daft M.D.   On: 01/31/2014 11:18   PROCEDURES: Colonoscopy 12/25/13: ENDOSCOPIC IMPRESSION: 1. colonic polyposis 2. Moderate diverticulosis was noted in the sigmoid colon 3. Internal hemorrhoids RECOMMENDATIONS: 1. hyomax as needed, abdominal pain 2. colonoscopy 3 years  Physical Exam: General: Pleasant, well developed ,  AA female in no acute distress Head: Normocephalic and atraumatic Eyes:  sclerae anicteric, conjunctiva pink  Ears: Normal auditory acuity Lungs: Clear throughout to auscultation Heart: Regular rate and rhythm Abdomen: Soft, non distended, mild to moderate LLQ and suprapubic tenderness, no rebound or guarding. No masses, no hepatomegaly. Normal bowel sounds Rectal:small skin tag, DRE very painful for pt. NTG ointment .125% applied internally  to anal area--this provided pt relief. Anoscopy performed. Pt had an ant fissure. She also had internal hemorrhoids that were bleeding Musculoskeletal: Symmetrical with no gross  deformities  Extremities: No edema  Neurological: Alert oriented x 4, grossly nonfocal Psychological:  Alert and cooperative. Normal mood and affect  Assessment and Recommendations: #1. Left lower quadrant and suprapubic abdominal pain. These may be due to diverticulitis that was not appreciated on CT scan. Her pain may also be due to ischemic colitis. The patient will empirically be treated with a course of Cipro 500 mg twice daily for 10 days along with Flagyl 500 mg 3 times daily for 10 days. The patient has been instructed to avoid ingestion of any alcohol while on these medications. She has also been instructed to use MiraLAX 1 cap full twice daily. She should adhere to a low-residue diet.   #2 GERD. The patient has been reminded to adhere to an antireflux regimen and limit her alcohol consumption. She will continue pantoprazole 40 mg each morning and will be given a trial of ranitidine 300 mg at at bedtime.  #3 anal fissure and internal hemorrhoids. She will be given a trial of diltiazem ointments 2% to apply rectally twice a day. She will also use hydrocortisone cream rectally twice daily. She should use Tucks  wipes.   The patient has been instructed to contact us if her pain becomes worse. She will follow up in 3-4 weeks, sooner as needed.         Margaret Rangel, Margaret Rangel 02/02/2014, Pager [phone]

## 2014-02-02 NOTE — Telephone Encounter (Signed)
Per Cecille Rubin okay to fill  Faxed to pharmacy  Patient notified

## 2014-02-02 NOTE — Patient Instructions (Addendum)
We have sent the following medications to [facility] for you to pick up at your convenience: Diltiazem Gel 2%, please apply to rectum twice daily for ten days   We have sent the following medications to [facility] for you to pick up at your convenience: Miralax, take one capful and mix it in eight ounces of juice or water, drink once a day  Zantac 300 mg, take one tablet by mouth at bedtime  Flagyl 500 mg, take one tablet by mouth three times a day for ten days(CANNOT DRINK ALCOHOL WHEN TAKING THIS MEDICATION) Cipro 500 mg, take one tablet by mouth twice a day for ten days Hydrocortisone Suppositories, please insert one suppositories into rectum twice   Please continue your Pantoprazole, one tablet thirty minutes before breakfast  Please follow Low Residue Diet given to you today   Please call back and speak with [place] if bleeding or pain is not better in a couple of days   Please call at the end of this month to schedule a follow up with Arta Bruce, PA-C for the first week in November

## 2014-02-03 NOTE — Progress Notes (Signed)
Reviewed and agree with management. Robert D. Kaplan, M.D., FACG  

## 2014-02-04 ENCOUNTER — Telehealth: Payer: Self-pay | Admitting: Physician Assistant

## 2014-02-04 NOTE — Telephone Encounter (Signed)
Patient reports whelps and blister like rash that itches. She denies wheeze or feeling short of breath. She has stopped the ATB's cipro and the flagyl. The reaction started yesterday and has not gotten any worse. She did not go to the ER. States this happened in the past with ATB's. She has not taken any further ATB's. She continues to have some abdominal discomfort. Not further rectal pain. She continues to use the Diltiazem and tramadol.Please advise on ATB's and abdominal symptoms.

## 2014-02-04 NOTE — Telephone Encounter (Signed)
Tell pt to not take anymore antibiotics. Is she constipated? When was her last BM? Is she using hyomax? Can use benadryl for the itch. If she develops SOB needs to go to ER.

## 2014-02-04 NOTE — Telephone Encounter (Signed)
She last had a BM yesterday. She is taking Miralax for constipation and has taken a dose today. She is taking Benadryl. She is advised to go to the ER if she acutely worsens or becomes SOB.

## 2014-02-04 NOTE — Telephone Encounter (Signed)
Does she have any hyomax left or does she need some sent in (script?)

## 2014-02-05 NOTE — Telephone Encounter (Signed)
I see a discontinued Rx for Bentyl on her medication list. I can send it in to see if her insurance will cover it. Give me the specifics.

## 2014-02-05 NOTE — Telephone Encounter (Signed)
Pain meds will not help her pain, only mask it. Can she try bentyl 10 mg bid prn?

## 2014-02-05 NOTE — Telephone Encounter (Signed)
She shouod try the hyomax. Is she still itching?

## 2014-02-05 NOTE — Telephone Encounter (Signed)
Bentyl 10 mg bid prn spasm, cramps. # 60 no RF

## 2014-02-05 NOTE — Telephone Encounter (Signed)
I called the patient today. She is having bowel movements. She does not itch. Her insurance will not pay for the Hyoscyamine Sulfate and it costs $35 (too much for her). She would love to have more pain medications because her stomach does hurt at times.

## 2014-02-05 NOTE — Telephone Encounter (Signed)
She has an Rx on file at the pharmacy.

## 2014-02-08 ENCOUNTER — Other Ambulatory Visit: Payer: Self-pay

## 2014-02-08 MED ORDER — DICYCLOMINE HCL 10 MG PO CAPS: 10.0000 mg | ORAL_CAPSULE | Freq: Two times a day (BID) | ORAL | Status: DC | PRN

## 2014-02-08 NOTE — Telephone Encounter (Signed)
[facility] pharmacist will have to order Bentyl but thinks her insurance will cover it. I have explained this to the patient.

## 2014-03-17 ENCOUNTER — Telehealth: Payer: Self-pay

## 2014-03-17 DIAGNOSIS — R102 Pelvic and perineal pain: Secondary | ICD-10-CM

## 2014-03-17 NOTE — Telephone Encounter (Signed)
Per Dr. Harolyn Rutherford, patient needs pelvic U/S prior to clinic appointment. U/S scheduled for 03/23/14 at 1330. Clinic appointment 04/29/14 at 1245. Called patient and informed her of both U/S and clinic appointment date, times and locations-- informed her U/S is very important and needs to be completed prior to appointment. Advised she arrive 15 minutes prior to both appointments. Patient verbalized understanding and gratitude. No questions or concerns.

## 2014-03-23 ENCOUNTER — Ambulatory Visit (HOSPITAL_COMMUNITY)
Admission: RE | Admit: 2014-03-23 | Discharge: 2014-03-23 | Disposition: A | Discharge status: Home / Self Care | Payer: Commercial Managed Care - HMO | Source: Ambulatory Visit | Attending: Obstetrics & Gynecology | Admitting: Obstetrics & Gynecology

## 2014-03-23 DIAGNOSIS — Z90710 Acquired absence of both cervix and uterus: Secondary | ICD-10-CM | POA: Insufficient documentation

## 2014-03-23 DIAGNOSIS — R1032 Left lower quadrant pain: Secondary | ICD-10-CM | POA: Insufficient documentation

## 2014-03-23 DIAGNOSIS — R102 Pelvic and perineal pain: Secondary | ICD-10-CM

## 2014-03-24 ENCOUNTER — Telehealth: Payer: Self-pay

## 2014-03-24 NOTE — Telephone Encounter (Signed)
Called patient and informed her of normal results and explained that because she does not have a GYN problem there is really no reason to keep appointment in January. Explained that if her pain were to continue her PCP may want to consider referral to General Surgery for diagnostic laparoscopy. Patient verbalized understanding and gratitude. Informed her we would cancel January clinic appointment with her consent-- she stated that was OK to do so. No questions or concerns. Message sent to admin pool to cancel. Attempted to call [facility] (referring practice) to inform them of results/no need for referral here/possible plan at [phone]-- left message stating we are calling regarding a referral you have made to our office, please call clinic.

## 2014-03-24 NOTE — Telephone Encounter (Signed)
-----   Message from Osborne Oman, MD sent at 03/24/2014 12:31 PM EST ----- No GYN abnormality seen on ultrasound, no cause of pain seen.  If pain is persistent, she may need referral to General Surgery for possible diagnostic laparoscopy.  She has a long history of GI related pain upon review of her previous notes.  She has an appointment in GYN clinic in January 2016 and if she decides to still keep this appointment, this will still be the recommendation.  Furthermore, no pain medications will be prescribed from the GYN clinic.  Please call to inform patient of results and recommendations.

## 2014-04-29 ENCOUNTER — Encounter: Payer: Commercial Managed Care - HMO | Admitting: Obstetrics and Gynecology

## 2014-08-16 ENCOUNTER — Other Ambulatory Visit: Payer: Self-pay

## 2014-08-16 ENCOUNTER — Telehealth: Payer: Self-pay | Admitting: Gastroenterology

## 2014-08-16 MED ORDER — HYDROCORTISONE ACETATE 25 MG RE SUPP: 25.0000 mg | Freq: Two times a day (BID) | RECTAL | Status: DC

## 2014-08-16 MED ORDER — HYDROCORTISONE 2.5 % RE CREA: 1.0000 "application " | TOPICAL_CREAM | Freq: Four times a day (QID) | RECTAL | Status: DC

## 2014-08-16 NOTE — Telephone Encounter (Signed)
Try Anusol HC suppositories and cream.  Apply after each bowel movement.  One suppository daily at bedtime and every morning for 7 days.

## 2014-08-16 NOTE — Telephone Encounter (Signed)
She is having bleeding after her bm. She reports diarrhea 3 to 4 times a day and the bleeding when she has to "wipe and wipe". She is nauseated but eating. She is using the cream we gave her. This is a 4 day history of symptoms.

## 2014-08-17 NOTE — Telephone Encounter (Signed)
Patient agrees to this plan. New rx's sent to her [place].

## 2015-04-05 DIAGNOSIS — Z23 Encounter for immunization: Secondary | ICD-10-CM | POA: Diagnosis not present

## 2015-04-05 DIAGNOSIS — J4520 Mild intermittent asthma, uncomplicated: Secondary | ICD-10-CM | POA: Diagnosis not present

## 2015-04-05 DIAGNOSIS — M5136 Other intervertebral disc degeneration, lumbar region: Secondary | ICD-10-CM | POA: Diagnosis not present

## 2015-04-05 DIAGNOSIS — E119 Type 2 diabetes mellitus without complications: Secondary | ICD-10-CM | POA: Diagnosis not present

## 2015-04-05 DIAGNOSIS — I10 Essential (primary) hypertension: Secondary | ICD-10-CM | POA: Diagnosis not present

## 2015-04-05 DIAGNOSIS — J209 Acute bronchitis, unspecified: Secondary | ICD-10-CM | POA: Diagnosis not present

## 2015-04-12 DIAGNOSIS — J209 Acute bronchitis, unspecified: Secondary | ICD-10-CM | POA: Diagnosis not present

## 2015-05-15 ENCOUNTER — Emergency Department (HOSPITAL_COMMUNITY)
Admission: EM | Admit: 2015-05-15 | Discharge: 2015-05-16 | Disposition: A | Discharge status: Home / Self Care | Payer: Medicare Other | Attending: Emergency Medicine | Admitting: Emergency Medicine

## 2015-05-15 ENCOUNTER — Emergency Department (HOSPITAL_COMMUNITY): Payer: Medicare Other

## 2015-05-15 ENCOUNTER — Encounter (HOSPITAL_COMMUNITY): Payer: Self-pay

## 2015-05-15 DIAGNOSIS — E669 Obesity, unspecified: Secondary | ICD-10-CM | POA: Diagnosis not present

## 2015-05-15 DIAGNOSIS — R112 Nausea with vomiting, unspecified: Secondary | ICD-10-CM | POA: Diagnosis present

## 2015-05-15 DIAGNOSIS — R1011 Right upper quadrant pain: Secondary | ICD-10-CM | POA: Insufficient documentation

## 2015-05-15 DIAGNOSIS — Z8673 Personal history of transient ischemic attack (TIA), and cerebral infarction without residual deficits: Secondary | ICD-10-CM | POA: Diagnosis not present

## 2015-05-15 DIAGNOSIS — Z86018 Personal history of other benign neoplasm: Secondary | ICD-10-CM | POA: Diagnosis not present

## 2015-05-15 DIAGNOSIS — E119 Type 2 diabetes mellitus without complications: Secondary | ICD-10-CM | POA: Diagnosis not present

## 2015-05-15 DIAGNOSIS — Z79899 Other long term (current) drug therapy: Secondary | ICD-10-CM | POA: Insufficient documentation

## 2015-05-15 DIAGNOSIS — G8929 Other chronic pain: Secondary | ICD-10-CM | POA: Insufficient documentation

## 2015-05-15 DIAGNOSIS — F1110 Opioid abuse, uncomplicated: Secondary | ICD-10-CM | POA: Insufficient documentation

## 2015-05-15 DIAGNOSIS — R1115 Cyclical vomiting syndrome unrelated to migraine: Secondary | ICD-10-CM

## 2015-05-15 DIAGNOSIS — I10 Essential (primary) hypertension: Secondary | ICD-10-CM | POA: Diagnosis not present

## 2015-05-15 DIAGNOSIS — G43A0 Cyclical vomiting, not intractable: Secondary | ICD-10-CM | POA: Diagnosis not present

## 2015-05-15 DIAGNOSIS — J45909 Unspecified asthma, uncomplicated: Secondary | ICD-10-CM | POA: Diagnosis not present

## 2015-05-15 DIAGNOSIS — F1210 Cannabis abuse, uncomplicated: Secondary | ICD-10-CM | POA: Diagnosis not present

## 2015-05-15 DIAGNOSIS — M1990 Unspecified osteoarthritis, unspecified site: Secondary | ICD-10-CM | POA: Insufficient documentation

## 2015-05-15 DIAGNOSIS — F17210 Nicotine dependence, cigarettes, uncomplicated: Secondary | ICD-10-CM | POA: Diagnosis not present

## 2015-05-15 DIAGNOSIS — K219 Gastro-esophageal reflux disease without esophagitis: Secondary | ICD-10-CM | POA: Diagnosis not present

## 2015-05-15 DIAGNOSIS — Z880 Allergy status to penicillin: Secondary | ICD-10-CM | POA: Diagnosis not present

## 2015-05-15 LAB — LIPASE, BLOOD: Lipase: 36 U/L (ref 11–51)

## 2015-05-15 LAB — CBC WITH DIFFERENTIAL/PLATELET
Basophils Absolute: 0 10*3/uL (ref 0.0–0.1)
Basophils Relative: 1 %
Eosinophils Absolute: 0.4 10*3/uL (ref 0.0–0.7)
Eosinophils Relative: 5 %
HCT: 31.3 % — ABNORMAL LOW (ref 36.0–46.0)
Hemoglobin: 10.3 g/dL — ABNORMAL LOW (ref 12.0–15.0)
Lymphocytes Relative: 59 %
Lymphs Abs: 4.4 10*3/uL — ABNORMAL HIGH (ref 0.7–4.0)
MCH: 26.6 pg (ref 26.0–34.0)
MCHC: 32.9 g/dL (ref 30.0–36.0)
MCV: 80.9 fL (ref 78.0–100.0)
Monocytes Absolute: 0.7 10*3/uL (ref 0.1–1.0)
Monocytes Relative: 9 %
Neutro Abs: 1.9 10*3/uL (ref 1.7–7.7)
Neutrophils Relative %: 26 %
Platelets: 337 10*3/uL (ref 150–400)
RBC: 3.87 MIL/uL (ref 3.87–5.11)
RDW: 16.6 % — ABNORMAL HIGH (ref 11.5–15.5)
WBC: 7.4 10*3/uL (ref 4.0–10.5)

## 2015-05-15 LAB — COMPREHENSIVE METABOLIC PANEL
ALT: 15 U/L (ref 14–54)
AST: 21 U/L (ref 15–41)
Albumin: 3.7 g/dL (ref 3.5–5.0)
Alkaline Phosphatase: 70 U/L (ref 38–126)
Anion gap: 11 (ref 5–15)
BUN: 11 mg/dL (ref 6–20)
CO2: 21 mmol/L — ABNORMAL LOW (ref 22–32)
Calcium: 8.5 mg/dL — ABNORMAL LOW (ref 8.9–10.3)
Chloride: 105 mmol/L (ref 101–111)
Creatinine, Ser: 0.79 mg/dL (ref 0.44–1.00)
GFR calc Af Amer: >60 mL/min (ref >60)
GFR calc non Af Amer: >60 mL/min (ref >60)
Glucose, Bld: 126 mg/dL — ABNORMAL HIGH (ref 65–99)
Potassium: 3 mmol/L — ABNORMAL LOW (ref 3.5–5.1)
Sodium: 137 mmol/L (ref 135–145)
Total Bilirubin: 0.5 mg/dL (ref 0.3–1.2)
Total Protein: 7 g/dL (ref 6.5–8.1)

## 2015-05-15 LAB — URINALYSIS, ROUTINE W REFLEX MICROSCOPIC
Bilirubin Urine: NEGATIVE
Glucose, UA: NEGATIVE mg/dL
Hgb urine dipstick: NEGATIVE
Ketones, ur: NEGATIVE mg/dL
Leukocytes, UA: NEGATIVE
Nitrite: NEGATIVE
Protein, ur: NEGATIVE mg/dL
Specific Gravity, Urine: 1.007 (ref 1.005–1.030)
pH: 6 (ref 5.0–8.0)

## 2015-05-15 LAB — I-STAT TROPONIN, ED: Troponin i, poc: 0.01 ng/mL (ref 0.00–0.08)

## 2015-05-15 MED ORDER — MORPHINE SULFATE (PF) 4 MG/ML IV SOLN
4.0000 mg | Freq: Once | INTRAVENOUS | Status: AC
  Administered 2015-05-15: 4 mg via INTRAVENOUS
  Filled 2015-05-15: qty 1

## 2015-05-15 MED ORDER — ONDANSETRON HCL 4 MG/2ML IJ SOLN
4.0000 mg | Freq: Once | INTRAMUSCULAR | Status: AC
  Administered 2015-05-15: 4 mg via INTRAVENOUS
  Filled 2015-05-15: qty 2

## 2015-05-15 MED ORDER — SODIUM CHLORIDE 0.9 % IV BOLUS (SEPSIS)
1000.0000 mL | Freq: Once | INTRAVENOUS | Status: AC
  Administered 2015-05-15: 1000 mL via INTRAVENOUS

## 2015-05-15 MED ORDER — DIPHENHYDRAMINE HCL 50 MG/ML IJ SOLN
25.0000 mg | Freq: Once | INTRAMUSCULAR | Status: AC
  Administered 2015-05-15: 25 mg via INTRAVENOUS
  Filled 2015-05-15: qty 1

## 2015-05-15 MED ORDER — HYDROMORPHONE HCL 1 MG/ML IJ SOLN
1.0000 mg | Freq: Once | INTRAMUSCULAR | Status: AC
  Administered 2015-05-15: 1 mg via INTRAVENOUS
  Filled 2015-05-15: qty 1

## 2015-05-15 MED ORDER — HYDROCODONE-ACETAMINOPHEN 5-325 MG PO TABS: 1.0000 | ORAL_TABLET | Freq: Four times a day (QID) | ORAL | Status: DC | PRN

## 2015-05-15 MED ORDER — ONDANSETRON 4 MG PO TBDP: 4.0000 mg | ORAL_TABLET | Freq: Three times a day (TID) | ORAL | Status: DC | PRN

## 2015-05-15 MED ORDER — POTASSIUM CHLORIDE CRYS ER 20 MEQ PO TBCR
40.0000 meq | EXTENDED_RELEASE_TABLET | Freq: Once | ORAL | Status: AC
Start: 2015-05-15 — End: 2015-05-15
  Administered 2015-05-15: 40 meq via ORAL
  Filled 2015-05-15: qty 2

## 2015-05-15 NOTE — ED Provider Notes (Signed)
CSN: [id number]     Arrival date & time 05/15/15  2010 History   First MD Initiated Contact with Patient 05/15/15 2016     Chief Complaint  Patient presents with  . Abdominal Pain  . Emesis     (Consider location/radiation/quality/duration/timing/severity/associated sxs/prior Treatment) The history is provided by the patient.  Margaret Rangel is a 70 y.o. female hx of HTN, IBS, obesity, DM, here presenting with epigastric pain, right upper quadrant pain. Patient states that she had intermittent epigastric pain for the last month or so. Over the last week, it became progressively worse. She also was coughing at that time so saw her doctor 4 days ago and was diagnosed with bronchitis and started on steroids, breathing treatments which did not help. She saw her doctor 2 days ago and was still complaining of right upper quadrant pain so an outpatient ultrasound was ordered. However, over the last 2 days, patient has worsening of abdominal pain and has been persistently vomiting and unable to keep anything down. She states that the pain radiated to her back.  Denies any fevers or chills or diarrhea. Denies any chest pain.   Past Medical History  Diagnosis Date  . HTN (hypertension)   . Chronic back pain   . Asthma   . Irritable bowel syndrome (IBS)   . Low blood potassium   . Obese   . GERD (gastroesophageal reflux disease)   . Anal fissure   . Diverticulosis of colon (without mention of hemorrhage) 2009    Colonoscopy  . Hemorrhoids   . Abdominal pain   . Hernia   . Arthritis   . Hyperlipidemia     low  . Colon adenoma 2009, 2012    Colonoscopy  . Stroke ([place])     x2  . Ulcer   . DM (diabetes mellitus) ([place])     off medicines for diabetes 12-18-13   Past Surgical History  Procedure Laterality Date  . Partial hysterectomy  1971  . Tonsillectomy    . Flexible sigmoidoscopy N/A 07/14/2012    Procedure: FLEXIBLE SIGMOIDOSCOPY;  Surgeon: Inda Castle, MD;  Location: [facility]  END[facility];  Service: Endoscopy;  Laterality: N/A;  . Hemorrhoid banding N/A 07/14/2012    Procedure: HEMORRHOID BANDING;  Surgeon: Inda Castle, MD;  Location: [facility];  Service: Endoscopy;  Laterality: N/A;  . Colonoscopy    . Polypectomy     Family History  Problem Relation Age of Onset  . Prostate cancer Brother   . Heart disease Mother   . Anuerysm Father   . Lung cancer Brother   . Colon cancer Brother   . Rectal cancer Neg Hx   . Stomach cancer Neg Hx    Social History  Substance Use Topics  . Smoking status: Current Some Day Smoker -- 0.25 packs/day    Types: Cigarettes  . Smokeless tobacco: Never Used  . Alcohol Use: 12.6 oz/week    21 Cans of beer per week   OB History    No data available     Review of Systems  Gastrointestinal: Positive for vomiting and abdominal pain.  All other systems reviewed and are negative.     Allergies  Codeine and Penicillins  Home Medications   Prior to Admission medications   Medication Sig Start Date End Date Taking? Authorizing Provider  amLODipine (NORVASC) 5 MG tablet Take 5 mg by mouth daily.   Yes Historical Provider, MD  cholecalciferol (VITAMIN D) 1000 UNITS tablet Take 1,000  Units by mouth daily.   Yes Historical Provider, MD  clonazePAM (KLONOPIN) 0.5 MG tablet Take 0.5 mg by mouth daily as needed. For anxiety. 04/12/15  Yes Historical Provider, MD  pantoprazole (PROTONIX) 40 MG tablet Take 40 mg by mouth daily as needed (for heartburn).    Yes Historical Provider, MD  VENTOLIN HFA 108 (90 Base) MCG/ACT inhaler Inhale 1-2 puffs into the lungs 2 (two) times daily. 04/13/15  Yes Historical Provider, MD  HYDROcodone-acetaminophen (NORCO/VICODIN) 5-325 MG tablet Take 1 tablet by mouth every 6 (six) hours as needed. 05/15/15   Wandra Arthurs, MD  ondansetron (ZOFRAN-ODT) 4 MG disintegrating tablet Take 1 tablet (4 mg total) by mouth 3 (three) times daily as needed. For nausea. 05/15/15   Wandra Arthurs, MD   BP 151/71 mmHg   Pulse 71  Temp(Src) 98.6 F (37 C) (Oral)  Resp 15  SpO2 100% Physical Exam  Constitutional: She is oriented to person, place, and time.  Uncomfortable, vomiting   HENT:  Head: Normocephalic.  MM dry   Eyes: Conjunctivae are normal. Pupils are equal, round, and reactive to light.  Neck: Normal range of motion. Neck supple.  Cardiovascular: Normal rate, regular rhythm and normal heart sounds.   Pulmonary/Chest: Effort normal and breath sounds normal. No respiratory distress. She has no wheezes. She has no rales.  Abdominal: Soft. Bowel sounds are normal.  + RUQ tenderness, mild murphy sign. Mild R CVAT   Musculoskeletal: Normal range of motion. She exhibits no edema or tenderness.  Neurological: She is alert and oriented to person, place, and time. No cranial nerve deficit. Coordination normal.  Skin: Skin is warm and dry.  Psychiatric: She has a normal mood and affect. Her behavior is normal. Judgment and thought content normal.  Nursing note and vitals reviewed.   ED Course  Procedures (including critical care time) Labs Review Labs Reviewed  CBC WITH DIFFERENTIAL/PLATELET - Abnormal; Notable for the following:    Hemoglobin 10.3 (*)    HCT 31.3 (*)    RDW 16.6 (*)    Lymphs Abs 4.4 (*)    All other components within normal limits  COMPREHENSIVE METABOLIC PANEL - Abnormal; Notable for the following:    Potassium 3.0 (*)    CO2 21 (*)    Glucose, Bld 126 (*)    Calcium 8.5 (*)    All other components within normal limits  LIPASE, BLOOD  URINALYSIS, ROUTINE W REFLEX MICROSCOPIC (NOT AT [facility])  Randolm Idol, ED    Imaging Review US Renal  05/15/2015  CLINICAL DATA:  Right flank pain EXAM: RENAL / URINARY TRACT ULTRASOUND COMPLETE COMPARISON:  01/31/2014 CT. FINDINGS: Right Kidney: Length: 10 cm. Echogenicity within normal limits. No mass or hydronephrosis visualized. Left Kidney: Length: 10 cm. Echogenicity within normal limits. No mass or hydronephrosis visualized.  Bladder: Appears normal for degree of bladder distention. IMPRESSION: Normal renal ultrasound. Electronically Signed   By: Monte Fantasia M.D.   On: 05/15/2015 22:38   Dg Abd Acute W/chest  05/15/2015  CLINICAL DATA:  Abdominal pain and vomiting. EXAM: DG ABDOMEN ACUTE W/ 1V CHEST COMPARISON:  Abdominal CT 01/31/2014 FINDINGS: There is no evidence of dilated bowel loops or free intraperitoneal air. Moderate volume of stool is present, without indication of impaction. No radiopaque calculi seen. Noted venous and arterial calcifications. Normal heart size and mediastinal contours. Few linear opacities at the left base compatible with mild atelectasis. IMPRESSION: Negative abdominal radiographs.  No acute cardiopulmonary disease. Electronically Signed  By: Monte Fantasia M.D.   On: 05/15/2015 21:43   US Abdomen Limited Ruq  05/15/2015  CLINICAL DATA:  Abdominal pain, worse in the right upper quadrant EXAM: US ABDOMEN LIMITED - RIGHT UPPER QUADRANT COMPARISON:  CT 01/31/2014 FINDINGS: Gallbladder: No gallstones or wall thickening visualized. No sonographic Murphy sign noted by sonographer. Common bile duct: Diameter: 4 mm.  Where visualized, no filling defect. Liver: Heterogeneous without focal lesion or definitive steatosis. Antegrade flow in the imaged portal venous system. IMPRESSION: No explanation for pain. Electronically Signed   By: Monte Fantasia M.D.   On: 05/15/2015 22:00   I have personally reviewed and evaluated these images and lab results as part of my medical decision-making.   EKG Interpretation   Date/Time:  Sunday May 15 2015 20:18:27 EST Ventricular Rate:  80 PR Interval:  201 QRS Duration: 78 QT Interval:  384 QTC Calculation: 443 R Axis:   58 Text Interpretation:  Sinus rhythm Atrial premature complex Repol abnrm  suggests ischemia, diffuse leads No significant change since last tracing  Confirmed by YAO  MD, DAVID ([id number]) on 05/15/2015 9:07:43 PM      MDM   Final  diagnoses:  RUQ abdominal pain   Margaret Rangel is a 70 y.o. female here with RUQ pain, R flank pain. Likely biliary colic vs renal colic vs gastritis, less likely ACS. Will get labs, RUQ and Renal US. Will give pain meds and reassess.   11 pm Patient's RUQ and renal US unremarkable. K 3.0, supplemented. Still had pain and given dilaudid but had a rash and was given benadryl and improved. Ordered CT ab/pel to further assess. Trop neg x 1 and symptoms for several days so will not need further cardiac workup.   11:36 PM CT pending. Signed out to Dr. Randal Buba. If CT neg and pain controlled, can dc home with pain meds and zofran.     Wandra Arthurs, MD 05/15/15 [phone]

## 2015-05-15 NOTE — ED Notes (Signed)
According to EMS, pt reports epigastric pain that began a month ago, pt states it has progressively become worse. Pt states she has vomited x2 today and nausea increases with food.

## 2015-05-15 NOTE — ED Notes (Signed)
Bed: [id number] Expected date:  Expected time:  Means of arrival:  Comments: 21F RUQ pain x 8 hours +n/v

## 2015-05-15 NOTE — Discharge Instructions (Signed)
Stay hydrated.   Take zofran for nausea.   Take motrin for pain.   Take vicodin for severe pain. Do NOT drive with it.  See your doctor.   Return to ER if you have severe abdominal pain, vomiting, fever.

## 2015-05-16 ENCOUNTER — Encounter (HOSPITAL_COMMUNITY): Payer: Self-pay

## 2015-05-16 ENCOUNTER — Emergency Department (HOSPITAL_COMMUNITY): Payer: Medicare Other

## 2015-05-16 DIAGNOSIS — G43A0 Cyclical vomiting, not intractable: Secondary | ICD-10-CM | POA: Diagnosis not present

## 2015-05-16 LAB — RAPID URINE DRUG SCREEN, HOSP PERFORMED
Amphetamines: NOT DETECTED
Barbiturates: NOT DETECTED
Benzodiazepines: NOT DETECTED
Cocaine: NOT DETECTED
Opiates: POSITIVE — AB
Tetrahydrocannabinol: POSITIVE — AB

## 2015-05-16 MED ORDER — IOHEXOL 300 MG/ML  SOLN
25.0000 mL | Freq: Once | INTRAMUSCULAR | Status: AC | PRN
  Administered 2015-05-16: 25 mL via ORAL

## 2015-05-16 MED ORDER — IOHEXOL 300 MG/ML  SOLN
100.0000 mL | Freq: Once | INTRAMUSCULAR | Status: AC | PRN
  Administered 2015-05-16: 100 mL via INTRAVENOUS

## 2015-05-25 ENCOUNTER — Ambulatory Visit (INDEPENDENT_AMBULATORY_CARE_PROVIDER_SITE_OTHER): Payer: Medicare Other | Admitting: Gastroenterology

## 2015-05-25 ENCOUNTER — Encounter: Payer: Self-pay | Admitting: Gastroenterology

## 2015-05-25 VITALS — BP 132/88 | HR 90 | Ht 62.5 in | Wt 141.0 lb

## 2015-05-25 DIAGNOSIS — R1011 Right upper quadrant pain: Secondary | ICD-10-CM | POA: Diagnosis not present

## 2015-05-25 DIAGNOSIS — R0789 Other chest pain: Secondary | ICD-10-CM

## 2015-05-25 DIAGNOSIS — K625 Hemorrhage of anus and rectum: Secondary | ICD-10-CM | POA: Diagnosis not present

## 2015-05-25 NOTE — Progress Notes (Signed)
Patient ID: Margaret Rangel, female   DOB: Jul 30, 1945, 70 y.o.   MRN: [id number] [facility] Progress Note  Chief Complaint: Right upper quadrant abdominal pain and rectal bleeding  Subjective History:  This was a problem based visit for patient previously known to Dr. Deatra Ina for chronic conditions outlined below, but she is a new patient to me. She reports 2 months of constant right upper quadrant pain. It is at times dull, sometimes sharp, it radiates down the right side and is worsened with bending over and certain movements. She describes it as unrelenting, no clear change with food bowel movements or breathing. She has a long-standing history of chronic abdominal pain and alcohol abuse. She came to the ED for this pain a few weeks ago. Extensive workup including labs, RUQ ultrasound, and CT scan abdomen and pelvis with contrast were all normal. She also had a single episode of small-volume rectal bleeding about 2 weeks ago. Her bowel habits of an regular and no further bleeding. She denies heartburn or regurgitation nausea vomiting or early satiety or weight loss.  ROS: Cardiovascular:  no chest pain Respiratory: no dyspnea  Past Medical History: Past Medical History  Diagnosis Date  . HTN (hypertension)   . Chronic back pain   . Asthma   . Irritable bowel syndrome (IBS)   . Low blood potassium   . Obese   . GERD (gastroesophageal reflux disease)   . Anal fissure   . Diverticulosis of colon (without mention of hemorrhage) 2009    Colonoscopy  . Hemorrhoids   . Abdominal pain   . Hernia   . Arthritis   . Hyperlipidemia     low  . Colon adenoma 2009, 2012    Colonoscopy  . Stroke ([place])     x2  . Ulcer   . DM (diabetes mellitus) ([place])     off medicines for diabetes 12-18-13    Objective:  Med list reviewed  Vital signs in last 24 hrs: Filed Vitals:   05/25/15 1023  BP: 132/88  Pulse: 90    Physical Exam   HEENT: sclera anicteric, oral mucosa moist without  lesions  Neck: supple, no thyromegaly, JVD or lymphadenopathy  Cardiac: RRR without murmurs, S1S2 heard, no peripheral edema  Pulm: clear to auscultation bilaterally, normal RR and effort noted  Abdomen: soft, RUQ tenderness, actually all along the right anterior and lateral lower chest wall and rib cage., with active bowel sounds. No guarding or palpable hepatosplenomegaly  Skin; warm and dry, no jaundice or rash  Rectal exam in the presence of our medical assistant Vivien Rota showed no external lesions, normal sphincter tone, no fissure or palpable internal lesions and soft brown stool.  Recent Labs: CMP Latest Ref Rng 05/15/2015 01/31/2014 12/09/2013  Glucose 65 - 99 mg/dL 126(H) 114(H) -  BUN 6 - 20 mg/dL 11 6 -  Creatinine 0.44 - 1.00 mg/dL 0.79 0.67 -  Sodium 135 - 145 mmol/L 137 144 -  Potassium 3.5 - 5.1 mmol/L 3.0(L) 3.8 -  Chloride 101 - 111 mmol/L 105 106 -  CO2 22 - 32 mmol/L 21(L) 22 -  Calcium 8.9 - 10.3 mg/dL 8.5(L) 9.2 -  Total Protein 6.5 - 8.1 g/dL 7.0 6.9 7.7  Total Bilirubin 0.3 - 1.2 mg/dL 0.5 <0.2(L) 0.6  Alkaline Phos 38 - 126 U/L 70 86 87  AST 15 - 41 U/L 21 21 18   ALT 14 - 54 U/L 15 15 16      Radiologic studies:  Negative ultrasound and CT as noted above @ASSESSMENTPLANBEGIN @ Assessment: Encounter Diagnoses  Name Primary?  . RUQ pain Yes  . Chest wall pain   . Rectal bleeding     This pain appears to be musculoskeletal, extensive workup thus far negative. There may be an element of chronic pain and alcohol abuse involved as well. Plan: I had no further testing planned at present. I advised her to take NSAIDs, decrease alcohol consumption, use a heating pad, and see her PCP for pain control. She will follow up with [place] as needed.   Nelida Meuse III

## 2015-05-25 NOTE — Patient Instructions (Signed)
Please follow up as needed.  Thank you for choosing [facility]   Dr Wilfrid Lund III

## 2015-06-06 ENCOUNTER — Telehealth: Payer: Self-pay | Admitting: Emergency Medicine

## 2015-06-06 NOTE — Telephone Encounter (Signed)
Incoming pharmacy request for Miralax one capful in 8 oz once a day. Last filled 01/15/2015. Is this ok to refill?

## 2015-06-07 ENCOUNTER — Other Ambulatory Visit: Payer: Self-pay | Admitting: Emergency Medicine

## 2015-06-07 MED ORDER — POLYETHYLENE GLYCOL 3350 17 GM/SCOOP PO POWD: 1.0000 | Freq: Every day | ORAL | Status: DC

## 2015-06-07 NOTE — Telephone Encounter (Signed)
Yes, it can be refilled for one month, then can be handled by primary care after that

## 2015-06-17 ENCOUNTER — Telehealth: Payer: Self-pay | Admitting: Gastroenterology

## 2015-06-17 NOTE — Telephone Encounter (Signed)
Spoke with patient and she reports it hurts to swallow. States it started yesterday. It feels like a ball in her throat when she swallows water. She is able to eat but feels like the food goes by the "ball in my throat." Patient confirms she has been drinking beer.

## 2015-06-20 NOTE — Telephone Encounter (Signed)
Pt states she is feeling better and has stopped drinking, she will call back if she wants to schedule the barium swallow.

## 2015-06-20 NOTE — Telephone Encounter (Signed)
If it is still bothering her, please order a barium swallow to be sure there is nothing structural. I think she should also cut down alcohol consumption.

## 2015-06-21 ENCOUNTER — Other Ambulatory Visit: Payer: Self-pay

## 2015-06-21 ENCOUNTER — Other Ambulatory Visit: Payer: Self-pay | Admitting: Gastroenterology

## 2015-06-21 DIAGNOSIS — R0989 Other specified symptoms and signs involving the circulatory and respiratory systems: Secondary | ICD-10-CM

## 2015-06-21 DIAGNOSIS — R198 Other specified symptoms and signs involving the digestive system and abdomen: Secondary | ICD-10-CM

## 2015-06-29 ENCOUNTER — Ambulatory Visit (HOSPITAL_COMMUNITY)
Admission: RE | Admit: 2015-06-29 | Discharge: 2015-06-29 | Disposition: A | Discharge status: Home / Self Care | Payer: Medicare Other | Source: Ambulatory Visit | Attending: Gastroenterology | Admitting: Gastroenterology

## 2015-06-29 DIAGNOSIS — F458 Other somatoform disorders: Secondary | ICD-10-CM | POA: Diagnosis present

## 2015-06-29 DIAGNOSIS — R198 Other specified symptoms and signs involving the digestive system and abdomen: Secondary | ICD-10-CM

## 2015-06-29 DIAGNOSIS — R0989 Other specified symptoms and signs involving the circulatory and respiratory systems: Secondary | ICD-10-CM

## 2015-10-22 ENCOUNTER — Other Ambulatory Visit: Payer: Self-pay | Admitting: Internal Medicine

## 2015-10-22 DIAGNOSIS — N644 Mastodynia: Secondary | ICD-10-CM

## 2015-10-28 ENCOUNTER — Ambulatory Visit
Admission: RE | Admit: 2015-10-28 | Discharge: 2015-10-28 | Disposition: A | Discharge status: Home / Self Care | Payer: Medicare Other | Source: Ambulatory Visit | Attending: Internal Medicine | Admitting: Internal Medicine

## 2015-10-28 DIAGNOSIS — N644 Mastodynia: Secondary | ICD-10-CM

## 2016-01-29 NOTE — Progress Notes (Signed)
   [facility] Phone: [phone]   Date of Visit: 01/30/2016   HPI:  Patient is a new patient. Reported of BRBPR today.  BRBPR:  - last episode she had in 02/02/14  - started having bleeding yesterday after she had a BM; she had BRBPR that dripped into toilet bowl.  - reports she had to put a pad on; bleeding does eventually stop - reports she has loose stools and does not have constipation - reports feels like a knot hanging out of rectum  - was given Diltiazem 2% rectal cream BID by GI doctor in 2015 which helped but she has run out since then - has been feeling dizzy since this started - per chart review, colonoscopy in 12/2013 reports on internal hemorrhoids  - Prior PCP: Dr. Jeanie Cooks, Christean Grief: [facility]   Meds brought to clinic: Pantoprzole 40mg  daily Vit D3 1000IU Meloxicam: does not take anymore due to nausea Amlodipine 5mg  daily  Albuterol Reglan 10mg  BID PRN Percocet 5-325mg  q12 hr PRN  Clonazepam 0.5mg  daily PRN  Cyclobenzaprine 10mg  BID  Glimepiride 2mg  daily   ROS: See HPI.  [place]:  PHM:  DM2 Normocytic Anemia History of Colonic Polyps (Adenomatous [id number]) Reflux with Dysphagia s/p esophageal dilation for stricture 2014 Internal Hemorrhoids with hx rectal bleeding Colonic Diverticulosis Marijuana Use  PHYSICAL EXAM: BP (!) 153/67   Pulse (!) 101   Temp 98.7 F (37.1 C) (Oral)   Wt 145 lb (65.8 kg)   SpO2 96%   BMI 26.10 kg/m   GEN: NAD HEENT: Atraumatic, normocephalic, neck supple, EOMI, sclera clear  CV: RRR, no murmurs, rubs, or gallops PULM: CTAB, normal effort ABD: Soft, mild tenderness of lower quadrants without guarding or rebound, nondistended, NABS, no organomegaly NEURO: Awake, alert, no focal deficits grossly, normal speech Rectal Exam: no external hemorrhoids, internal hemorrhoids noted, no frank blood noted    ASSESSMENT/PLAN:  Health maintenance:  - Up to date on mammogram: wnl 10/2015  Bright Red  Blood Per Rectum:  POC hemoglobin stable at 9.9 from hgb 10.3 in 04/2015. No frank blood noted on exam. Recommended following up with GI physician ASAP. Return precautions reviewed.   Patient provided release of information form from prior PCP.   FOLLOW UP: Follow up as soon as possilbe to review new patient packet.   Smiley Houseman, MD PGY [place]

## 2016-01-30 ENCOUNTER — Ambulatory Visit (INDEPENDENT_AMBULATORY_CARE_PROVIDER_SITE_OTHER): Payer: Medicare Other | Admitting: Internal Medicine

## 2016-01-30 ENCOUNTER — Encounter: Payer: Self-pay | Admitting: Internal Medicine

## 2016-01-30 VITALS — BP 153/67 | HR 101 | Temp 98.7°F | Wt 145.0 lb

## 2016-01-30 DIAGNOSIS — K625 Hemorrhage of anus and rectum: Secondary | ICD-10-CM

## 2016-01-30 LAB — POCT HEMOGLOBIN: Hemoglobin: 9.9 g/dL — AB (ref 12.2–16.2)

## 2016-01-30 NOTE — Patient Instructions (Signed)
Please call your GI doctor as soon as possible.

## 2016-01-31 ENCOUNTER — Telehealth: Payer: Self-pay | Admitting: Gastroenterology

## 2016-01-31 NOTE — Telephone Encounter (Signed)
Spoke to patient, she is complaining of rectal bleeding, some light headiness. She saw PCP yesterday for this and was advised to call Gi, has appointment with Dr. Loletha Carrow on 12/6. Patient wants to know what else she can do for this.

## 2016-01-31 NOTE — Telephone Encounter (Signed)
Reviewed evaluation by PCP yesterday.  Since she has known internal hemorrhoids from colonoscopy in 2015 start OTC prep H supp bid.  Schedule sooner appt with APP if available.  If bleeding worsens ED or urgent care clinic for eval

## 2016-01-31 NOTE — Telephone Encounter (Signed)
Spoke to patient, advised to start OTC prep H suppository twice daily. She is scheduled to see APP on 10/19. Patient understands to go to urgent care or ED if rectal bleeding worsens.

## 2016-02-02 ENCOUNTER — Ambulatory Visit: Payer: Medicare Other | Admitting: Physician Assistant

## 2016-02-17 ENCOUNTER — Other Ambulatory Visit: Payer: Self-pay | Admitting: Internal Medicine

## 2016-02-17 NOTE — Telephone Encounter (Signed)
Pt is calling again since she also needs a refill on her Metoclopramide 10 mg jw

## 2016-02-17 NOTE — Telephone Encounter (Signed)
Pt called and was asking for a refill on some medication that I could not see on her list. She said that another doctor wrote it. I was understand what she was saying but it sounded like astrovat?  Not sure so could someone call and find out what she was saying. jw

## 2016-02-20 ENCOUNTER — Telehealth: Payer: Self-pay | Admitting: Internal Medicine

## 2016-02-20 NOTE — Telephone Encounter (Signed)
Pt called to check the status of her request for medication. The doctor refused this and the patient doesn't understand why. Please call to discuss. jw

## 2016-02-22 NOTE — Telephone Encounter (Signed)
Pt was seen as a new pt 01-30-16.  She needs refills on amlodopine and metroprolol.   These were given to her by Dr Leafy Kindle.  She also hasnt been asleep in over 48 hours. She would like to have something to make her sleep.  She was crying and says she is depressed.

## 2016-02-23 MED ORDER — AMLODIPINE BESYLATE 5 MG PO TABS: 5.0000 mg | ORAL_TABLET | Freq: Every day | ORAL | 0 refills | Status: DC

## 2016-02-23 NOTE — Telephone Encounter (Signed)
Called Margaret Rangel to discuss concerns.   Reports of insomnia for 3 nights. Reports that she is sleepy but just cannot go to sleep. Denies being stressed. Reports she has not had issues with insomnia since the 80s. Reports not feeling depressed. Reports not being on mediations in the past but when asked about taking Trazodone in the past (but reports this was a long time ago). Denies drinking caffinated beverages or alcohol in the late afternoon/evening. Exercises in the AM. Reports she goes to the living room to watch TV when she cannot go to sleep.    We reviewed her medications, and patient was confused which medications she was on and which medications are for which medical diagnosis. After reviewing, she reports that she has been out of her Amlodipine for the past 3 days. I told her that maybe her elevated blood pressure is contributing to her symptoms of insomnia. She requests a refill on Reglan but could not express why she is taking this medication. She is a new patient, first seen last month. She signed a release of records from prior PCP but I do not believe we have received any records yet.    I asked her to make an appointment to further discuss, she agreed. I did send in Norvasc Rx to her pharmacy.

## 2016-02-29 NOTE — Progress Notes (Signed)
[facility] Phone: [phone]   Date of Visit: 03/01/2016   HPI:  Pain under R breast:  - symptoms for the past 4 months;  has had this pain before had it about 7 months ago and it self resolved after 3-4 months  - reports it comes from her back up to the front and stays at the front  - describes as a pulling pain - symptom intermittent but occurs daily; occurs maybe twice a day. Each episode lasts 5-10 minutes; last episode last night. - movement makes it worse: twisting or turning over in bed, deep inspiration  - no trauma that she can think of  - sometimes takes ibuprofen which does ease it up some. Also some hydrocodone  - no dysuria or hematuria - no nausea or shortness of breath but diaphoresis associated - no rash  - no history of MI  R sided pain, Chronic:  - hip pain is for about a year. Worse with walking. Feels like hip is slipping and can hear it "crack or pop" - right arm pain for about year. sharts at the shoulder  - had history of CVA with weakness of R side which has resolved. Hot shooting pain down the right side since then - had been using hot muscle rub  - last PCP gave Hydrocodone for the pain but has been out for about 1 month. Usually takes one tablet a day if that.  - right leg sometimes gets weak and give out but not currently. No numbness.  - sometimes her fingers and toes get numb   - no imaging in chart. Patient reports that she did not have imaging done by PCP - no history of trauma - past UD screens have been positive for marijuana. Patient reports she does still use.   ROS: See HPI.  [place]:  PHM:  DM2 Normocytic Anemia History of Colonic Polyps (Adenomatous 2009,2012, 2015): colonoscopy due 12/2016 Reflux with Dysphagia s/p esophageal dilation for stricture 2014 Internal Hemorrhoids with hx rectal bleeding Colonic Diverticulosis Marijuana Use Hx of CVA: last stroke about 5 years ago Hx of early esophageal stricture  s/p [place] dilation (09/2012)   PHYSICAL EXAM: BP (!) 142/66   Pulse 82   Temp 97.5 F (36.4 C) (Oral)   Wt 152 lb 9.6 oz (69.2 kg)   BMI 27.47 kg/m  GEN: NAD CV: RRR, no murmurs, rubs, or gallops.  Chest wall-pain on palpation of right chest wall below breast. No rash noted on chest wall.  PULM: CTAB, normal effort  ABD: Soft, nontender, nondistended, NABS, no organomegaly SKIN: No rash or cyanosis; warm and well-perfused EXTR: No lower extremity edema or calf tenderness MSK: normal gait. Shoulder: tenderness to palpation of the R bicep brachi insertion but otherwise normal palpation. normal active ROM but reports of some discomfort of the right shoulder with flexion and abduction of the right arm. Normal strength bilaterally Neer test elicits discomfort at right shoulder and reports of pain with empty can test. Normal radial pulses bilaterally Back and Lower extremities: no tenderness to palpation of spine or paraspinal muscles. slow to lay flat on exam table. Tenderness to palpation in the R gluteal region near piriformis muscle. Normal ROM of hip although has discomfort in R hip. R Straight leg test is positive. L straight leg raise test negative. Normal sensation to light touch of bilateral legs. Normal strength bilaterally.  PSYCH: Mood and affect euthymic, normal rate and volume of speech NEURO: Awake, alert,  normal speech   ASSESSMENT/PLAN:  Health maintenance:  - Flu vaccine today   Right Chest Wall Pain: Likely MSK as pain is reproduced with palpation. No signs or symptoms of shingles.  - symptomatic tx with heat/ice  Chronic Right Sided Pain: Right shoulder pain is consistent with biceps tendonitis vs impingement. No red flags. Due to chronicity of symptoms will obtain R shoulder x-ray. Right hip pain with sciatica is likely due to piriformis syndrome. Again due to chronicity of symptoms will obtain lumbar spine and Right hip x-ray. Provided range of motion exercises for  shoulder and lower back. Recommended PRN NSAID with food. Reports she does to another doctor for spinal injections (today) but nothing in chart regarding this.  - follow up in 1 month  Smiley Houseman, MD PGY [place]

## 2016-03-01 ENCOUNTER — Telehealth: Payer: Self-pay | Admitting: Internal Medicine

## 2016-03-01 ENCOUNTER — Ambulatory Visit (INDEPENDENT_AMBULATORY_CARE_PROVIDER_SITE_OTHER): Payer: Medicare Other | Admitting: Internal Medicine

## 2016-03-01 ENCOUNTER — Encounter: Payer: Self-pay | Admitting: Internal Medicine

## 2016-03-01 VITALS — BP 142/66 | HR 82 | Temp 97.5°F | Wt 152.6 lb

## 2016-03-01 DIAGNOSIS — M5441 Lumbago with sciatica, right side: Secondary | ICD-10-CM | POA: Diagnosis not present

## 2016-03-01 DIAGNOSIS — M5440 Lumbago with sciatica, unspecified side: Secondary | ICD-10-CM | POA: Diagnosis not present

## 2016-03-01 DIAGNOSIS — Z23 Encounter for immunization: Secondary | ICD-10-CM | POA: Diagnosis not present

## 2016-03-01 DIAGNOSIS — Z8719 Personal history of other diseases of the digestive system: Secondary | ICD-10-CM | POA: Insufficient documentation

## 2016-03-01 DIAGNOSIS — M25511 Pain in right shoulder: Secondary | ICD-10-CM | POA: Diagnosis not present

## 2016-03-01 DIAGNOSIS — M545 Low back pain: Secondary | ICD-10-CM | POA: Diagnosis not present

## 2016-03-01 NOTE — Telephone Encounter (Signed)
At her visit today, pt forgot to ask for sleeeping medicine.  She hasnt slept in 4 nights. [facility] [address]

## 2016-03-01 NOTE — Patient Instructions (Addendum)
Please go to [facility] to get xray of your back and hip. Please do the following exercises to help with your shoulder and back pain. Please follow up with be in 1 month.   Back Exercises Introduction If you have pain in your back, do these exercises 2-3 times each day or as told by your doctor. When the pain goes away, do the exercises once each day, but repeat the steps more times for each exercise (do more repetitions). If you do not have pain in your back, do these exercises once each day or as told by your doctor. Exercises Single Knee to Chest  Do these steps 3-5 times in a row for each leg: 1. Lie on your back on a firm bed or the floor with your legs stretched out. 2. Bring one knee to your chest. 3. Hold your knee to your chest by grabbing your knee or thigh. 4. Pull on your knee until you feel a gentle stretch in your lower back. 5. Keep doing the stretch for 10-30 seconds. 6. Slowly let go of your leg and straighten it. Pelvic Tilt  Do these steps 5-10 times in a row: 1. Lie on your back on a firm bed or the floor with your legs stretched out. 2. Bend your knees so they point up to the ceiling. Your feet should be flat on the floor. 3. Tighten your lower belly (abdomen) muscles to press your lower back against the floor. This will make your tailbone point up to the ceiling instead of pointing down to your feet or the floor. 4. Stay in this position for 5-10 seconds while you gently tighten your muscles and breathe evenly. Cat-Cow  Do these steps until your lower back bends more easily: 1. Get on your hands and knees on a firm surface. Keep your hands under your shoulders, and keep your knees under your hips. You may put padding under your knees. 2. Let your head hang down, and make your tailbone point down to the floor so your lower back is round like the back of a cat. 3. Stay in this position for 5 seconds. 4. Slowly lift your head and make your tailbone point up to the ceiling  so your back hangs low (sags) like the back of a cow. 5. Stay in this position for 5 seconds. Press-Ups  Do these steps 5-10 times in a row: 1. Lie on your belly (face-down) on the floor. 2. Place your hands near your head, about shoulder-width apart. 3. While you keep your back relaxed and keep your hips on the floor, slowly straighten your arms to raise the top half of your body and lift your shoulders. Do not use your back muscles. To make yourself more comfortable, you may change where you place your hands. 4. Stay in this position for 5 seconds. 5. Slowly return to lying flat on the floor. Bridges  Do these steps 10 times in a row: 1. Lie on your back on a firm surface. 2. Bend your knees so they point up to the ceiling. Your feet should be flat on the floor. 3. Tighten your butt muscles and lift your butt off of the floor until your waist is almost as high as your knees. If you do not feel the muscles working in your butt and the back of your thighs, slide your feet 1-2 inches farther away from your butt. 4. Stay in this position for 3-5 seconds. 5. Slowly lower your butt to the  floor, and let your butt muscles relax. If this exercise is too easy, try doing it with your arms crossed over your chest. Belly Crunches  Do these steps 5-10 times in a row: 1. Lie on your back on a firm bed or the floor with your legs stretched out. 2. Bend your knees so they point up to the ceiling. Your feet should be flat on the floor. 3. Cross your arms over your chest. 4. Tip your chin a little bit toward your chest but do not bend your neck. 5. Tighten your belly muscles and slowly raise your chest just enough to lift your shoulder blades a tiny bit off of the floor. 6. Slowly lower your chest and your head to the floor. Back Lifts  Do these steps 5-10 times in a row: 1. Lie on your belly (face-down) with your arms at your sides, and rest your forehead on the floor. 2. Tighten the muscles in your  legs and your butt. 3. Slowly lift your chest off of the floor while you keep your hips on the floor. Keep the back of your head in line with the curve in your back. Look at the floor while you do this. 4. Stay in this position for 3-5 seconds. 5. Slowly lower your chest and your face to the floor. Contact a doctor if:  Your back pain gets a lot worse when you do an exercise.  Your back pain does not lessen 2 hours after you exercise. If you have any of these problems, stop doing the exercises. Do not do them again unless your doctor says it is okay. Get help right away if:  You have sudden, very bad back pain. If this happens, stop doing the exercises. Do not do them again unless your doctor says it is okay. This information is not intended to replace advice given to you by your health care provider. Make sure you discuss any questions you have with your health care provider. Document Released: 05/05/2010 Document Revised: 09/08/2015 Document Reviewed: 05/27/2014  2017 Elsevier   Shoulder Range of Motion Exercises Shoulder range of motion (ROM) exercises are designed to keep the shoulder moving freely. They are often recommended for people who have shoulder pain. Phase 1 exercises When you are able, do this exercise 5-6 days per week, or as told by your health care provider. Work toward doing 2 sets of 10 swings. Pendulum Exercise  How To Do This Exercise Lying Down 7. Lie face-down on a bed with your abdomen close to the side of the bed. 8. Let your arm hang over the side of the bed. 9. Relax your shoulder, arm, and hand. 10. Slowly and gently swing your arm forward and back. Do not use your neck muscles to swing your arm. They should be relaxed. If you are struggling to swing your arm, have someone gently swing it for you. When you do this exercise for the first time, swing your arm at a 15 degree angle for 15 seconds, or swing your arm 10 times. As pain lessens over time, increase the  angle of the swing to 30-45 degrees. 11. Repeat steps 1-4 with the other arm. How To Do This Exercise While Standing 5. Stand next to a sturdy chair or table and hold on to it with your hand. 1. Bend forward at the waist. 2. Bend your knees slightly. 3. Relax your other arm and let it hang limp. 4. Relax the shoulder blade of the arm that is  hanging and let it drop. 5. While keeping your shoulder relaxed, use body motion to swing your arm in small circles. The first time you do this exercise, swing your arm for about 30 seconds or 10 times. When you do it next time, swing your arm for a little longer. 6. Stand up tall and relax. 7. Repeat steps 1-7, this time changing the direction of the circles. 6. Repeat steps 1-8 with the other arm. Phase 2 exercises Do these exercises 3-4 times per day on 5-6 days per week or as told by your health care provider. Work toward holding the stretch for 20 seconds. Stretching Exercise 1 6. Lift your arm straight out in front of you. 7. Bend your arm 90 degrees at the elbow (right angle) so your forearm goes across your body and looks like the letter "L." 8. Use your other arm to gently pull the elbow forward and across your body. 9. Repeat steps 1-3 with the other arm. Stretching Exercise 2  You will need a towel or rope for this exercise. 6. Bend one arm behind your back with the palm facing outward. 7. Hold a towel with your other hand. 8. Reach the arm that holds the towel above your head, and bend that arm at the elbow. Your wrist should be behind your neck. 9. Use your free hand to grab the free end of the towel. 10. With the higher hand, gently pull the towel up behind you. 11. With the lower hand, pull the towel down behind you. 12. Repeat steps 1-6 with the other arm. Phase 3 exercises Do each of these exercises at four different times of day (sessions) every day or as told by your health care provider. To begin with, repeat each exercise 5 times  (repetitions). Work toward doing 3 sets of 12 repetitions or as told by your health care provider. Strengthening Exercise 1  You will need a light weight for this activity. As you grow stronger, you may use a heavier weight. 6. Standing with a weight in your hand, lift your arm straight out to the side until it is at the same height as your shoulder. 7. Bend your arm at 90 degrees so that your fingers are pointing to the ceiling. 8. Slowly raise your hand until your arm is straight up in the air. 9. Repeat steps 1-3 with the other arm. Strengthening Exercise 2  You will need a light weight for this activity. As you grow stronger, you may use a heavier weight. 7. Standing with a weight in your hand, gradually move your straight arm in an arc, starting at your side, then out in front of you, then straight up over your head. 8. Gradually move your other arm in an arc, starting at your side, then out in front of you, then straight up over your head. 9. Repeat steps 1-2 with the other arm. Strengthening Exercise 3  You will need an elastic band for this activity. As you grow stronger, gradually increase the size of the bands or increase the number of bands that you use at one time. 6. While standing, hold an elastic band in one hand and raise that arm up in the air. 7. With your other hand, pull down the band until that hand is by your side. 8. Repeat steps 1-2 with the other arm. This information is not intended to replace advice given to you by your health care provider. Make sure you discuss any questions you have with  your health care provider. Document Released: 12/30/2002 Document Revised: 11/27/2015 Document Reviewed: 03/29/2014 Elsevier Interactive Patient Education  2017 [employer].

## 2016-03-02 MED ORDER — MELATONIN 1 MG PO CAPS: 1.0000 mg | ORAL_CAPSULE | Freq: Every day | ORAL | 0 refills | Status: DC

## 2016-03-02 NOTE — Telephone Encounter (Signed)
Will forward to MD to advise. Osha Rane,CMA  

## 2016-03-02 NOTE — Telephone Encounter (Signed)
Called patient regarding her desire to have sleeping medication. She is currently not on any sleeping medication. Prior to this episode of insomnia, she had issues with insomnia in the 80s for which she took over the counter medication and it resolved. She tried Alleve PM this time and it did not help. I explained to the patient that we did not discuss her insomnia during her clinic visit (she did not bring up this concern) and that patients usually cannot request a medication for a concern they have prior to being seen for the same concern in clinic especially if it is not simply a refill request for a medication they are already on.   We talked about her insomnia (noted on telephone conversation on 11/06). We discussed sleep hygiene. I am only willing to send in Melatonin to pharmacy for her. Patient agreed. Rx sent.

## 2016-03-02 NOTE — Telephone Encounter (Signed)
Pt is calling back because she was seen yesterday and forgot to mention to the doctor that she would like something to help her sleep. Please call to discuss this with the patient. jw

## 2016-03-07 ENCOUNTER — Encounter (HOSPITAL_COMMUNITY): Payer: Self-pay

## 2016-03-07 ENCOUNTER — Observation Stay (HOSPITAL_COMMUNITY)
Admission: EM | Admit: 2016-03-07 | Discharge: 2016-03-07 | Discharge status: Against Medical Advice | Payer: Medicare Other | Attending: Family Medicine | Admitting: Family Medicine

## 2016-03-07 ENCOUNTER — Emergency Department (HOSPITAL_COMMUNITY): Payer: Medicare Other

## 2016-03-07 ENCOUNTER — Other Ambulatory Visit: Payer: Self-pay

## 2016-03-07 DIAGNOSIS — F17210 Nicotine dependence, cigarettes, uncomplicated: Secondary | ICD-10-CM | POA: Insufficient documentation

## 2016-03-07 DIAGNOSIS — R112 Nausea with vomiting, unspecified: Secondary | ICD-10-CM | POA: Diagnosis not present

## 2016-03-07 DIAGNOSIS — R0602 Shortness of breath: Secondary | ICD-10-CM | POA: Diagnosis not present

## 2016-03-07 DIAGNOSIS — I10 Essential (primary) hypertension: Secondary | ICD-10-CM | POA: Insufficient documentation

## 2016-03-07 DIAGNOSIS — E119 Type 2 diabetes mellitus without complications: Secondary | ICD-10-CM | POA: Insufficient documentation

## 2016-03-07 DIAGNOSIS — J45909 Unspecified asthma, uncomplicated: Secondary | ICD-10-CM | POA: Insufficient documentation

## 2016-03-07 DIAGNOSIS — R079 Chest pain, unspecified: Secondary | ICD-10-CM | POA: Diagnosis not present

## 2016-03-07 DIAGNOSIS — R42 Dizziness and giddiness: Secondary | ICD-10-CM | POA: Insufficient documentation

## 2016-03-07 DIAGNOSIS — E669 Obesity, unspecified: Secondary | ICD-10-CM | POA: Insufficient documentation

## 2016-03-07 DIAGNOSIS — K219 Gastro-esophageal reflux disease without esophagitis: Secondary | ICD-10-CM | POA: Diagnosis not present

## 2016-03-07 DIAGNOSIS — Z8673 Personal history of transient ischemic attack (TIA), and cerebral infarction without residual deficits: Secondary | ICD-10-CM | POA: Diagnosis not present

## 2016-03-07 DIAGNOSIS — Z6826 Body mass index (BMI) 26.0-26.9, adult: Secondary | ICD-10-CM | POA: Diagnosis not present

## 2016-03-07 DIAGNOSIS — Z79899 Other long term (current) drug therapy: Secondary | ICD-10-CM | POA: Diagnosis not present

## 2016-03-07 DIAGNOSIS — R61 Generalized hyperhidrosis: Secondary | ICD-10-CM | POA: Insufficient documentation

## 2016-03-07 DIAGNOSIS — Z8249 Family history of ischemic heart disease and other diseases of the circulatory system: Secondary | ICD-10-CM | POA: Diagnosis not present

## 2016-03-07 DIAGNOSIS — R0789 Other chest pain: Secondary | ICD-10-CM | POA: Diagnosis not present

## 2016-03-07 LAB — BASIC METABOLIC PANEL
Anion gap: 15 (ref 5–15)
BUN: 10 mg/dL (ref 6–20)
CO2: 22 mmol/L (ref 22–32)
Calcium: 9.6 mg/dL (ref 8.9–10.3)
Chloride: 100 mmol/L — ABNORMAL LOW (ref 101–111)
Creatinine, Ser: 1.04 mg/dL — ABNORMAL HIGH (ref 0.44–1.00)
GFR calc Af Amer: >60 mL/min (ref >60)
GFR calc non Af Amer: 53 mL/min — ABNORMAL LOW (ref >60)
Glucose, Bld: 156 mg/dL — ABNORMAL HIGH (ref 65–99)
Potassium: 2.6 mmol/L — CL (ref 3.5–5.1)
Sodium: 137 mmol/L (ref 135–145)

## 2016-03-07 LAB — CBC
HCT: 31.6 % — ABNORMAL LOW (ref 36.0–46.0)
Hemoglobin: 10.3 g/dL — ABNORMAL LOW (ref 12.0–15.0)
MCH: 22.5 pg — ABNORMAL LOW (ref 26.0–34.0)
MCHC: 32.6 g/dL (ref 30.0–36.0)
MCV: 69 fL — ABNORMAL LOW (ref 78.0–100.0)
Platelets: 409 10*3/uL — ABNORMAL HIGH (ref 150–400)
RBC: 4.58 MIL/uL (ref 3.87–5.11)
RDW: 17.6 % — ABNORMAL HIGH (ref 11.5–15.5)
WBC: 10.2 10*3/uL (ref 4.0–10.5)

## 2016-03-07 LAB — I-STAT TROPONIN, ED
Troponin i, poc: 0 ng/mL (ref 0.00–0.08)
Troponin i, poc: 0.01 ng/mL (ref 0.00–0.08)

## 2016-03-07 MED ORDER — POTASSIUM CHLORIDE CRYS ER 20 MEQ PO TBCR
40.0000 meq | EXTENDED_RELEASE_TABLET | Freq: Once | ORAL | Status: AC
  Administered 2016-03-07: 40 meq via ORAL
  Filled 2016-03-07: qty 2

## 2016-03-07 MED ORDER — MAGNESIUM OXIDE 400 (241.3 MG) MG PO TABS
800.0000 mg | ORAL_TABLET | Freq: Once | ORAL | Status: AC
  Administered 2016-03-07: 800 mg via ORAL
  Filled 2016-03-07: qty 2

## 2016-03-07 NOTE — ED Notes (Signed)
Transported to xray 

## 2016-03-07 NOTE — ED Provider Notes (Signed)
[name] DEPT Provider Note   CSN: [id number] Arrival date & time: 03/07/16  1821     History   Chief Complaint Chief Complaint  Patient presents with  . Chest Pain  . Dizziness    HPI Margaret Rangel is a 70 y.o. female.  70 yo F with a chief complaint of chest pain. This associated with shortness of breath diaphoresis nausea and vomiting. The pain radiated up into her left arm. She described the pain as sharp sensation. They got progressively worse over the course of about an hour and she called 911. EMS noted that she had some diffuse ST depressions. Her pain resolved with 324 of aspirin and 1 nitroglycerin. Patient currently is asymptomatic. She just quit smoking about 3 weeks ago. Has a history of hypertension hyperlipidemia diabetes. Her sister had an MI when she was in her 51s. The patient has had 2 strokes.   The history is provided by the patient.  Chest Pain   This is a new problem. The current episode started 1 to 2 hours ago. The problem occurs constantly. The problem has been rapidly improving. The pain is associated with rest. The pain is present in the substernal region. The pain is at a severity of 9/10. The pain is severe. The quality of the pain is described as sharp. The pain radiates to the left arm. Duration of episode(s) is 2 hours. Associated symptoms include diaphoresis, nausea, shortness of breath and vomiting. Pertinent negatives include no dizziness, no fever, no headaches and no palpitations. She has tried rest for the symptoms. The treatment provided no relief. Risk factors include being elderly and smoking/tobacco exposure.  Dizziness  Associated symptoms: chest pain, nausea, shortness of breath and vomiting   Associated symptoms: no headaches and no palpitations     Past Medical History:  Diagnosis Date  . Abdominal pain   . Anal fissure   . Arthritis   . Asthma   . Chronic back pain   . Colon adenoma 2009, 2012   Colonoscopy  .  Diverticulosis of colon (without mention of hemorrhage) 2009   Colonoscopy  . DM (diabetes mellitus) ([place])    off medicines for diabetes 12-18-13  . GERD (gastroesophageal reflux disease)   . Hemorrhoids   . Hernia   . HTN (hypertension)   . Hyperlipidemia    low  . Irritable bowel syndrome (IBS)   . Low blood potassium   . Obese   . Stroke ([place])    x2  . Ulcer [facility])     Patient Active Problem List   Diagnosis Date Noted  . History of esophageal stricture 03/01/2016  . Rectal bleeding 02/02/2014  . Anal fissure 02/02/2014  . Esophageal reflux 02/02/2014  . Internal hemorrhoids 02/02/2014  . Personal history of colonic polyps 12/09/2013  . Dysphagia, unspecified(787.20) 09/11/2012  . Nausea alone 09/11/2012  . Internal hemorrhoids without mention of complication 99991111  . Abdominal pain, left lower quadrant 04/24/2011  . Diabetes mellitus without mention of complication [id number]    Past Surgical History:  Procedure Laterality Date  . COLONOSCOPY    . FLEXIBLE SIGMOIDOSCOPY N/A 07/14/2012   Procedure: FLEXIBLE SIGMOIDOSCOPY;  Surgeon: Inda Castle, MD;  Location: [facility] ENDOSCOPY;  Service: Endoscopy;  Laterality: N/A;  . HEMORRHOID BANDING N/A 07/14/2012   Procedure: HEMORRHOID BANDING;  Surgeon: Inda Castle, MD;  Location: [facility] ENDOSCOPY;  Service: Endoscopy;  Laterality: N/A;  . PARTIAL HYSTERECTOMY  1971  . POLYPECTOMY    . TONSILLECTOMY  OB History    No data available       Home Medications    Prior to Admission medications   Medication Sig Start Date End Date Taking? Authorizing Provider  albuterol (PROVENTIL HFA;VENTOLIN HFA) 108 (90 Base) MCG/ACT inhaler Inhale 2 puffs into the lungs every 6 (six) hours as needed for wheezing or shortness of breath.   Yes Historical Provider, MD  amLODipine (NORVASC) 5 MG tablet Take 1 tablet (5 mg total) by mouth daily. 02/23/16  Yes Smiley Houseman, MD  cholecalciferol (VITAMIN D) 1000 UNITS tablet Take  1,000 Units by mouth daily.   Yes Historical Provider, MD  clonazePAM (KLONOPIN) 0.5 MG tablet Take 0.5 mg by mouth daily as needed. For anxiety. 04/12/15  Yes Historical Provider, MD  meloxicam (MOBIC) 7.5 MG tablet Take 7.5 mg by mouth 2 (two) times daily as needed for pain.   Yes Historical Provider, MD  pantoprazole (PROTONIX) 40 MG tablet Take 40 mg by mouth daily as needed (for heartburn).    Yes Historical Provider, MD  HYDROcodone-acetaminophen (NORCO/VICODIN) 5-325 MG tablet Take 1 tablet by mouth every 6 (six) hours as needed. Patient not taking: Reported on 03/07/2016 05/15/15   Drenda Freeze, MD  Melatonin 1 MG CAPS Take 1 capsule (1 mg total) by mouth at bedtime. Patient not taking: Reported on 03/07/2016 03/02/16   Smiley Houseman, MD  ondansetron (ZOFRAN-ODT) 4 MG disintegrating tablet Take 1 tablet (4 mg total) by mouth 3 (three) times daily as needed. For nausea. Patient not taking: Reported on 03/07/2016 05/15/15   Drenda Freeze, MD  polyethylene glycol powder (GLYCOLAX/MIRALAX) powder Take 255 g by mouth daily. Patient not taking: Reported on 03/07/2016 06/07/15   Doran Stabler, MD    Family History Family History  Problem Relation Age of Onset  . Prostate cancer Brother   . Heart disease Mother   . Anuerysm Father   . Lung cancer Brother   . Colon cancer Brother   . Rectal cancer Neg Hx   . Stomach cancer Neg Hx     Social History Social History  Substance Use Topics  . Smoking status: Current Some Day Smoker    Packs/day: 0.25    Types: Cigarettes  . Smokeless tobacco: Never Used  . Alcohol use 12.6 oz/week    21 Cans of beer per week     Allergies   Codeine and Penicillins   Review of Systems Review of Systems  Constitutional: Positive for diaphoresis. Negative for chills and fever.  HENT: Negative for congestion and rhinorrhea.   Eyes: Negative for redness and visual disturbance.  Respiratory: Positive for shortness of breath.  Negative for wheezing.   Cardiovascular: Positive for chest pain. Negative for palpitations.  Gastrointestinal: Positive for nausea and vomiting.  Genitourinary: Negative for dysuria and urgency.  Musculoskeletal: Negative for arthralgias and myalgias.  Skin: Negative for pallor and wound.  Neurological: Negative for dizziness and headaches.     Physical Exam Updated Vital Signs BP (!) 132/54   Pulse 83   Temp 98.3 F (36.8 C) (Oral)   Resp 20   Ht 5\' 2"  (1.575 m)   Wt 145 lb (65.8 kg)   SpO2 100%   BMI 26.52 kg/m   Physical Exam  Constitutional: She is oriented to person, place, and time. She appears well-developed and well-nourished. No distress.  HENT:  Head: Normocephalic and atraumatic.  Eyes: EOM are normal. Pupils are equal, round, and reactive to light.  Neck:  Normal range of motion. Neck supple.  Cardiovascular: Normal rate and regular rhythm.  Exam reveals no gallop and no friction rub.   No murmur heard. Pulmonary/Chest: Effort normal. She has no wheezes. She has no rales.  Abdominal: Soft. She exhibits no distension and no mass. There is no tenderness. There is no guarding.  Musculoskeletal: She exhibits no edema or tenderness.  Neurological: She is alert and oriented to person, place, and time.  Skin: Skin is warm and dry. She is not diaphoretic.  Psychiatric: She has a normal mood and affect. Her behavior is normal.  Nursing note and vitals reviewed.    ED Treatments / Results  Labs (all labs ordered are listed, but only abnormal results are displayed) Labs Reviewed  CBC - Abnormal; Notable for the following:       Result Value   Hemoglobin 10.3 (*)    HCT 31.6 (*)    MCV 69.0 (*)    MCH 22.5 (*)    RDW 17.6 (*)    Platelets 409 (*)    All other components within normal limits  BASIC METABOLIC PANEL - Abnormal; Notable for the following:    Potassium 2.6 (*)    Chloride 100 (*)    Glucose, Bld 156 (*)    Creatinine, Ser 1.04 (*)    GFR calc non  Af Amer 53 (*)    All other components within normal limits  I-STAT TROPOININ, ED  I-STAT TROPOININ, ED    EKG  EKG Interpretation  Date/Time:  Wednesday March 07 2016 18:22:22 EST Ventricular Rate:  83 PR Interval:    QRS Duration: 86 QT Interval:  458 QTC Calculation: 539 R Axis:   52 Text Interpretation:  Sinus rhythm Probable LVH with secondary repol abnrm ST depr, consider ischemia, inferior leads Prolonged QT interval Baseline wander in lead(s) V5 t waves now upright in v4-6. ST depression in inferior and lateral leads seen in prior more pronounced Otherwise no significant change Confirmed by Talar Fraley MD, DANIEL [phone]) on 03/07/2016 6:27:23 PM       Radiology Dg Chest 2 View  Result Date: 03/07/2016 CLINICAL DATA:  Left side chest pain, SOB x1 day. Hx of HTN, DM, asthma, GERD, stroke, bronchitis. Ex-smoker, quit x1 month ago. EXAM: CHEST  2 VIEW COMPARISON:  None. FINDINGS: Normal mediastinum and cardiac silhouette. Normal pulmonary vasculature. No evidence of effusion, infiltrate, or pneumothorax. No acute bony abnormality. IMPRESSION: No acute cardiopulmonary process. Electronically Signed   By: Suzy Bouchard M.D.   On: 03/07/2016 19:01    Procedures Procedures (including critical care time)  Medications Ordered in ED Medications - No data to display   Initial Impression / Assessment and Plan / ED Course  I have reviewed the triage vital signs and the nursing notes.  Pertinent labs & imaging results that were available during my care of the patient were reviewed by me and considered in my medical decision making (see chart for details).  Clinical Course     70 yo F With sudden onset chest pain at rest. No history of similar. EKG when compared old has similar morphology but increased in amplitude of the depressions inferior and laterally. Patient is currently pain-free. Will obtain a troponin CBC BMP chest x-ray. Patient has a heart score of 6.   Trop negative,  place in obs.   Patient does not request to be admitted. She would like to go home. She has not had chest pain since she got the nitroglycerin. I discussed  at length with the patient that she may have had a heart attack and I would not know unless the repeat lab tests. She has a very high heart score in likely needs admission and further testing. I discussed with the patient that this may be fatal or result in permanent disability. She was able to repeat this back to me and does not feel that she wants to have another needle stick to evaluate for second troponin. She is not less in the hospitalist tomorrow is Thanksgiving. She has no pain she feels that she does not need to stay. AMA paperwork was signed. Patient was welcome to return at any time.  The patients results and plan were reviewed and discussed.   Any x-rays performed were independently reviewed by myself.   Differential diagnosis were considered with the presenting HPI.  Medications - No data to display  Vitals:   03/07/16 1915 03/07/16 1930 03/07/16 1931 03/07/16 2000  BP: 156/63 160/68 160/68 (!) 132/54  Pulse:   89 83  Resp: 23 20 16 20   Temp:   98.3 F (36.8 C)   TempSrc:   Oral   SpO2:   100% 100%  Weight:      Height:        Final diagnoses:  Chest pain with moderate risk for cardiac etiology      Final Clinical Impressions(s) / ED Diagnoses   Final diagnoses:  Chest pain with moderate risk for cardiac etiology    New Prescriptions New Prescriptions   No medications on file     Deno Etienne, DO 03/07/16 2232

## 2016-03-07 NOTE — ED Notes (Signed)
EKG given to Dr. Tyrone Nine

## 2016-03-07 NOTE — ED Triage Notes (Signed)
Per EMS: pt felt dizzy and lightheaded, after which came chest pain, sharp but not radiating. Aspirin was given 324, which reduced pain 8/10 to 4/10 and to 0. BS was 197. Pt has hx of two strokes about 7 years ago.  Pt states: "I smoke  Marijuana sometimes for depression. I used it today around 4:30 pm"

## 2016-03-07 NOTE — ED Notes (Signed)
CRITICAL VALUE ALERT  Critical value received:  Potassium  2.6

## 2016-03-07 NOTE — ED Notes (Signed)
Upon assessment of complaint, pt states: I feel SOB. 2L [place] applied.

## 2016-03-07 NOTE — ED Notes (Addendum)
Pt stated that legs were hurting and pt stated that the pt was having feelings of heat flashes made Verdis Frederickson (RN) aware.

## 2016-03-07 NOTE — Discharge Instructions (Signed)
Return for any worsening symptoms.

## 2016-03-12 ENCOUNTER — Other Ambulatory Visit: Payer: Self-pay | Admitting: Internal Medicine

## 2016-03-12 NOTE — Telephone Encounter (Signed)
Pt needs a refill on pantoprazole. Pt uses [facility] @ [address]. Please advise. Thanks! ep

## 2016-03-13 MED ORDER — PANTOPRAZOLE SODIUM 40 MG PO TBEC: 40.0000 mg | DELAYED_RELEASE_TABLET | Freq: Every day | ORAL | 0 refills | Status: DC

## 2016-03-15 ENCOUNTER — Inpatient Hospital Stay: Payer: Medicare Other | Admitting: Internal Medicine

## 2016-03-15 NOTE — Progress Notes (Deleted)
   [facility] Phone: [phone]   Date of Visit: 03/15/2016   HPI:  ***  ROS: See HPI.  [place]: ***  PHYSICAL EXAM: There were no vitals taken for this visit. Gen: *** HEENT: *** Heart: *** Lungs: *** Neuro: *** Ext: ***  ASSESSMENT/PLAN:  Health maintenance:  -***  No problem-specific Assessment & Plan notes found for this encounter.  FOLLOW UP: Follow up in *** for ***  Smiley Houseman, MD PGY [place]

## 2016-03-16 ENCOUNTER — Telehealth: Payer: Self-pay | Admitting: Internal Medicine

## 2016-03-16 NOTE — Telephone Encounter (Signed)
Pt didn't come in due to experiencing diarrhea with fear of bowel incontinence that day. She also spoke of other concerns such as fatigue, and a rash after using a patch for cessation. I advised her to call 911 incase of emergency and to sign up for mychart for further questions. I rescheduled her for 12/14 @ 1:45

## 2016-03-21 ENCOUNTER — Encounter: Payer: Self-pay | Admitting: Gastroenterology

## 2016-03-21 ENCOUNTER — Ambulatory Visit (INDEPENDENT_AMBULATORY_CARE_PROVIDER_SITE_OTHER): Payer: Medicare Other | Admitting: Gastroenterology

## 2016-03-21 VITALS — BP 120/50 | HR 92 | Ht 62.6 in | Wt 152.4 lb

## 2016-03-21 DIAGNOSIS — K648 Other hemorrhoids: Secondary | ICD-10-CM

## 2016-03-21 DIAGNOSIS — K625 Hemorrhage of anus and rectum: Secondary | ICD-10-CM | POA: Diagnosis not present

## 2016-03-21 NOTE — Progress Notes (Signed)
[facility] Progress Note  Chief Complaint: Rectal bleeding  Subjective  History:  Margaret Rangel was seen for the first time since February. As before, she continues to complain of chronic abdominal pain. Unfortunately, she also continues to drink several beers per day. She has had several days of rectal bleeding along with rectal burning and itching. Her stools have also been loose the last week or so.  ROS: Cardiovascular:  no chest pain Respiratory: no dyspnea  The patient's Past Medical, Family and Social History were reviewed and are on file in the EMR.  Objective:  Med list reviewed  Vital signs in last 24 hrs: Vitals:   03/21/16 1041  BP: (!) 120/50  Pulse: 92    Physical Exam  Exam chaperoned by our MA Toni  Sclera anicteric  Cardiac: RRR without murmurs, S1S2 heard, no peripheral edema  Pulm: clear to auscultation bilaterally, normal RR and effort noted  Abdomen: soft, no tenderness, with active bowel sounds. No guarding or palpable hepatosplenomegaly.  Skin; warm and dry, no jaundice or rash Rectal exam: Normal sphincter tone, no fissure or palpable mass. The patient had a somewhat exaggerated pain response to the rectal exam. Anoscopy: Internal hemorrhoids, no stigmata of bleeding. No fissure  @ASSESSMENTPLANBEGIN @ Assessment: Encounter Diagnoses  Name Primary?  . Internal hemorrhoids Yes  . Rectal bleeding   I do not see or feel a fissure. Her symptoms all appeared to be from internal hemorrhoids.    Plan: Lidocaine ointment and perforation HC suppositories   Nelida Meuse III

## 2016-03-21 NOTE — Patient Instructions (Addendum)
Use Recticare ointment on the anal area 2-3 times per day to relieve pain. Use a preparation H hydrocortisone suppository (available over the counter) twice daily for the next 7 days to relieve the hemorrhoid burning, itching and bleeding.  If you are age 71 or older, your body mass index should be between 23-30. Your Body mass index is 27.34 kg/m. If this is out of the aforementioned range listed, please consider follow up with your Primary Care Provider.  If you are age 51 or younger, your body mass index should be between 19-25. Your Body mass index is 27.34 kg/m. If this is out of the aformentioned range listed, please consider follow up with your Primary Care Provider.   Thank you for choosing [facility]  Dr Wilfrid Lund III

## 2016-03-28 NOTE — Progress Notes (Signed)
   [facility] Phone: [phone]   Date of Visit: 03/29/2016   HPI:  Patient seen in the ED on 11/22 for chest pain. EMS noted of diffuse ST depressions. Pain resolved with 324 ASA and 1 nitroglycerin. EKG "compared to old has similar morphology byt increased in the amplitude of troponin. Troponin negative. ED recommended admit for observation, however patient declined and left AMA. Patient denies chest pain since then. Does not have chest pain with exertion.    Muscle Cramps:  - reports symptoms are on the right side  - this started about two days ago - she reports of past history of this and was told this was due to her having low potassium. She has been taking over the counter potassium pills ("Nature Made")  HTN:  - checks blood pressure at home and reports it is usually 126/70 HR 87 - reports that she sometimes gets lightheaded  - takes Norvasc 5mg  daily   DM2: - last A1c in our system was in 05/2010 (8.5). Recently established care in this clinic - A1c today 6.1 - Cbgs: 117-120 in AM; ~136 about 1 hour after meal - reports no lows.   Tobacco Use:  - reports she has not been using since 02/14/16  - using over the counter Nicotine patch (14mg )   Hx of CVA:  - reports she is on a cholesterol medication (possibly Simvastatin) but she did not bring this medication. Advised her to bring all medications.   ROS: See HPI.  [place]:  DM2 Normocytic Anemia History of Colonic Polyps (Adenomatous 2009,2012, 2015): colonoscopy due 12/2016 Reflux with Dysphagia s/p esophageal dilation for stricture 2014 Internal Hemorrhoids with hx rectal bleeding Colonic Diverticulosis Marijuana Use Hx of CVA: last stroke about 5 years ago  PHYSICAL EXAM: BP (!) 130/48   Pulse 97   Temp 98.4 F (36.9 C) (Oral)   Ht 5\' 2"  (1.575 m)   Wt 152 lb (68.9 kg)   SpO2 96%   BMI 27.80 kg/m  GEN: NAD HEENT: Atraumatic, normocephalic, neck supple, EOMI, sclera clear  CV: RRR,  systolic murmur (patient reports chronic); no rubs or gallops PULM: CTAB, normal effort ABD: Soft, nontender, nondistended, NABS, no organomegaly SKIN: No rash or cyanosis; warm and well-perfused EXTR: No lower extremity edema or calf tenderness PSYCH: Mood and affect euthymic, normal rate and volume of speech NEURO: Awake, alert, no focal deficits grossly, normal speech  ASSESSMENT/PLAN:  Health maintenance:  - Hep C screening - Bone Density Scan - patient signed release of records: will wait to see if she received Zosta, PNA, and tetanus   Diabetes mellitus without mention of complication [id number] at goal. Continue current regimen. Foot exam today normal. Unsure if patient is up to date on PNA vaccine. Will wait for prior records.   Systolic murmur Denies symptoms of dyspnea on exertion, orthopena, or chest pain. Does not recall having this evaluated with Ultrasound in the past. Will obtain ECHO.   Essential hypertension Is on Norvasc 5 mg. Blood pressure today is very well controlled. Reports of intermittent lightheadedness. Will stop Norvasc now. Follow up in 1 month.   Muscle Cramping: History of symptoms usually associated with hypokalemia.  - BMP   Smiley Houseman, MD PGY [place]

## 2016-03-29 ENCOUNTER — Ambulatory Visit (INDEPENDENT_AMBULATORY_CARE_PROVIDER_SITE_OTHER): Payer: Medicare Other | Admitting: Internal Medicine

## 2016-03-29 ENCOUNTER — Encounter: Payer: Self-pay | Admitting: Internal Medicine

## 2016-03-29 VITALS — BP 130/48 | HR 97 | Temp 98.4°F | Ht 62.0 in | Wt 152.0 lb

## 2016-03-29 DIAGNOSIS — R011 Cardiac murmur, unspecified: Secondary | ICD-10-CM

## 2016-03-29 DIAGNOSIS — E2839 Other primary ovarian failure: Secondary | ICD-10-CM

## 2016-03-29 DIAGNOSIS — R252 Cramp and spasm: Secondary | ICD-10-CM

## 2016-03-29 DIAGNOSIS — E118 Type 2 diabetes mellitus with unspecified complications: Secondary | ICD-10-CM | POA: Diagnosis not present

## 2016-03-29 DIAGNOSIS — Z1159 Encounter for screening for other viral diseases: Secondary | ICD-10-CM | POA: Diagnosis not present

## 2016-03-29 DIAGNOSIS — I10 Essential (primary) hypertension: Secondary | ICD-10-CM

## 2016-03-29 LAB — COMPREHENSIVE METABOLIC PANEL
ALT: 19 U/L (ref 6–29)
AST: 21 U/L (ref 10–35)
Albumin: 4.1 g/dL (ref 3.6–5.1)
Alkaline Phosphatase: 76 U/L (ref 33–130)
BUN: 8 mg/dL (ref 7–25)
CO2: 24 mmol/L (ref 20–31)
Calcium: 9.6 mg/dL (ref 8.6–10.4)
Chloride: 104 mmol/L (ref 98–110)
Creat: 0.86 mg/dL (ref 0.60–0.93)
Glucose, Bld: 112 mg/dL — ABNORMAL HIGH (ref 65–99)
Potassium: 3.5 mmol/L (ref 3.5–5.3)
Sodium: 139 mmol/L (ref 135–146)
Total Bilirubin: 0.2 mg/dL (ref 0.2–1.2)
Total Protein: 7.1 g/dL (ref 6.1–8.1)

## 2016-03-29 LAB — POCT GLYCOSYLATED HEMOGLOBIN (HGB A1C): Hemoglobin A1C: 6.1

## 2016-03-29 NOTE — Patient Instructions (Addendum)
Please stop taking Amlodipine because your blood pressure is too well controlled. Please continue to check your blood pressure at home. Please call the clinic if you have blood pressures greater than 145/90.   Call you eye doctor and ask them to send a copy of your diabetic eye exam  We will get labs today. I will call you with the results  I ordered a bone density scan to screen for osteoporosis. Please call to get this set up.   Follow up in 3-4 weeks

## 2016-03-30 LAB — HEPATITIS C ANTIBODY: HCV Ab: NEGATIVE

## 2016-04-02 DIAGNOSIS — I10 Essential (primary) hypertension: Secondary | ICD-10-CM | POA: Insufficient documentation

## 2016-04-02 DIAGNOSIS — R011 Cardiac murmur, unspecified: Secondary | ICD-10-CM | POA: Insufficient documentation

## 2016-04-02 NOTE — Assessment & Plan Note (Signed)
Denies symptoms of dyspnea on exertion, orthopena, or chest pain. Does not recall having this evaluated with Ultrasound in the past. Will obtain ECHO.

## 2016-04-02 NOTE — Assessment & Plan Note (Signed)
A1c at goal. Continue current regimen. Foot exam today normal. Unsure if patient is up to date on PNA vaccine. Will wait for prior records.

## 2016-04-02 NOTE — Assessment & Plan Note (Signed)
Is on Norvasc 5 mg. Blood pressure today is very well controlled. Reports of intermittent lightheadedness. Will stop Norvasc now. Follow up in 1 month.

## 2016-04-03 ENCOUNTER — Other Ambulatory Visit: Payer: Self-pay | Admitting: Internal Medicine

## 2016-04-03 NOTE — Telephone Encounter (Signed)
Pt needs a refill on clonazepam. Pt is out and would like to pick up refill today. Please advise. Thanks! ep

## 2016-04-04 ENCOUNTER — Other Ambulatory Visit: Payer: Medicare Other

## 2016-04-04 MED ORDER — CLONAZEPAM 0.5 MG PO TABS: 0.5000 mg | ORAL_TABLET | Freq: Every day | ORAL | 0 refills | Status: DC | PRN

## 2016-04-04 NOTE — Telephone Encounter (Signed)
Rx printed and placed at front desk for pick up. Please call and inform patient.

## 2016-04-05 NOTE — Telephone Encounter (Signed)
Spoke with patient and informed her that Rx was printed and is at front desk and ready for pick up. Nat Christen, CMA

## 2016-04-18 ENCOUNTER — Other Ambulatory Visit (HOSPITAL_COMMUNITY): Payer: Medicare Other

## 2016-04-24 ENCOUNTER — Ambulatory Visit
Admission: RE | Admit: 2016-04-24 | Discharge: 2016-04-24 | Disposition: A | Discharge status: Home / Self Care | Payer: Medicare Other | Source: Ambulatory Visit | Attending: Family Medicine | Admitting: Family Medicine

## 2016-04-24 DIAGNOSIS — M810 Age-related osteoporosis without current pathological fracture: Secondary | ICD-10-CM | POA: Diagnosis not present

## 2016-04-24 DIAGNOSIS — E2839 Other primary ovarian failure: Secondary | ICD-10-CM

## 2016-05-01 ENCOUNTER — Telehealth: Payer: Self-pay | Admitting: Internal Medicine

## 2016-05-01 DIAGNOSIS — M810 Age-related osteoporosis without current pathological fracture: Secondary | ICD-10-CM | POA: Insufficient documentation

## 2016-05-01 MED ORDER — CALCIUM CARB-CHOLECALCIFEROL 600-800 MG-UNIT PO TABS: ORAL_TABLET | ORAL | 1 refills | Status: DC

## 2016-05-01 NOTE — Telephone Encounter (Signed)
Discussed with patient about bone density scan results. She is now diagnosed with osteoporosis. Discussed starting VitD-Ca supplements. Also discussed Alendronate. She does have a history of esophageal stricture s/p dilation but denies having issues with swallowing now. She reports she is having dental work done next week. Due to this we will delay the start of Alendronate. Patient to follow up with me in February. Will mail information about osteoporosis per request.

## 2016-05-11 ENCOUNTER — Telehealth: Payer: Self-pay | Admitting: Internal Medicine

## 2016-05-11 NOTE — Telephone Encounter (Signed)
Called patient regarding clearance for dental surgery. She needs to be evaluated by cardiology. She reports she already has an appointment with cardiology for this on 2/7. She also reports that her entire right side is numb which is new and sometimes the feeling in her right leg comes and goes. I reported that this could be something dangerous such as a possible stroke and that she should go to the ED for evaluation ASAP. She was initially hesitant but in the end said she will go.

## 2016-05-18 ENCOUNTER — Other Ambulatory Visit: Payer: Self-pay | Admitting: Internal Medicine

## 2016-05-18 NOTE — Telephone Encounter (Signed)
I received a refill request for Norvasc. Per chart review, at her last visit, we decided to discontinue Norvasc due to well controlled BP and patient report of feeling intermittently lightheaded. I asked the patient to follow up in 1 month (this was in December). It looks like she has an appointment next week. We will discuss then.

## 2016-05-21 ENCOUNTER — Other Ambulatory Visit: Payer: Self-pay | Admitting: Internal Medicine

## 2016-05-22 ENCOUNTER — Ambulatory Visit: Payer: Medicare Other | Admitting: Internal Medicine

## 2016-05-24 ENCOUNTER — Other Ambulatory Visit: Payer: Self-pay

## 2016-05-24 ENCOUNTER — Ambulatory Visit (HOSPITAL_COMMUNITY): Payer: Medicare Other | Attending: Cardiology

## 2016-05-24 DIAGNOSIS — Z8673 Personal history of transient ischemic attack (TIA), and cerebral infarction without residual deficits: Secondary | ICD-10-CM | POA: Insufficient documentation

## 2016-05-24 DIAGNOSIS — E785 Hyperlipidemia, unspecified: Secondary | ICD-10-CM | POA: Insufficient documentation

## 2016-05-24 DIAGNOSIS — I119 Hypertensive heart disease without heart failure: Secondary | ICD-10-CM | POA: Insufficient documentation

## 2016-05-24 DIAGNOSIS — R011 Cardiac murmur, unspecified: Secondary | ICD-10-CM | POA: Diagnosis not present

## 2016-05-24 DIAGNOSIS — I358 Other nonrheumatic aortic valve disorders: Secondary | ICD-10-CM | POA: Insufficient documentation

## 2016-05-24 DIAGNOSIS — E119 Type 2 diabetes mellitus without complications: Secondary | ICD-10-CM | POA: Diagnosis not present

## 2016-05-29 ENCOUNTER — Telehealth: Payer: Self-pay | Admitting: Internal Medicine

## 2016-05-29 ENCOUNTER — Ambulatory Visit (INDEPENDENT_AMBULATORY_CARE_PROVIDER_SITE_OTHER): Payer: Medicare Other | Admitting: Student

## 2016-05-29 ENCOUNTER — Encounter: Payer: Self-pay | Admitting: Student

## 2016-05-29 VITALS — BP 138/72 | HR 97 | Temp 98.4°F | Wt 153.0 lb

## 2016-05-29 DIAGNOSIS — M1611 Unilateral primary osteoarthritis, right hip: Secondary | ICD-10-CM | POA: Diagnosis not present

## 2016-05-29 DIAGNOSIS — Z87898 Personal history of other specified conditions: Secondary | ICD-10-CM

## 2016-05-29 MED ORDER — DICLOFENAC SODIUM 1 % TD GEL: 2.0000 g | Freq: Four times a day (QID) | TRANSDERMAL | 0 refills | Status: DC

## 2016-05-29 MED ORDER — GLIMEPIRIDE 2 MG PO TABS: 2.0000 mg | ORAL_TABLET | Freq: Every day | ORAL | 1 refills | Status: DC

## 2016-05-29 MED ORDER — PANTOPRAZOLE SODIUM 40 MG PO TBEC: 40.0000 mg | DELAYED_RELEASE_TABLET | Freq: Every day | ORAL | 1 refills | Status: DC

## 2016-05-29 MED ORDER — ALBUTEROL SULFATE HFA 108 (90 BASE) MCG/ACT IN AERS: 2.0000 | INHALATION_SPRAY | Freq: Four times a day (QID) | RESPIRATORY_TRACT | 2 refills | Status: DC | PRN

## 2016-05-29 MED ORDER — CALCIUM CARB-CHOLECALCIFEROL 600-800 MG-UNIT PO TABS: ORAL_TABLET | ORAL | 1 refills | Status: DC

## 2016-05-29 NOTE — Assessment & Plan Note (Addendum)
History and physical exam consistent with right hip OA worst pain at the iliac crest but does have popping sensation with walking. She already is on Norco for chronic pain and states she cannot tolerate oral NSAIDS because of stomach upset - will give voltaren gel and much of her pain is right beneath the skin at the iliac crest - continue OTC tylenol - will follow with PCP

## 2016-05-29 NOTE — Patient Instructions (Signed)
Follow up with Dr Dallas Schimke as needed If you have any questions or concerns, call the office at [phone]

## 2016-05-29 NOTE — Progress Notes (Signed)
   Subjective:    Patient ID: Margaret Rangel, female    DOB: 01/28/1946, 71 y.o.   MRN: [id number]   CC: right hip pain  HPI: 71 y/o F with right hip pain  Right hip pain - noticed one year ago but feels it is getting worse - worse with cold or cloudy weather - "popping and clicking" when she walks - no known injury, no falls - no weakness  Smoking status reviewed Current smoker  Review of Systems  Per HPI, else denies recent illness, fever, chest pain, shortness of breath   Objective:  BP 138/72   Pulse 97   Temp 98.4 F (36.9 C) (Oral)   Wt 153 lb (69.4 kg)   SpO2 98%   BMI 27.98 kg/m  Vitals and nursing note reviewed  General: NAD Cardiac: RRR Respiratory: CTAB, normal effort MSK: tenderness over right iliac crest, no greater trochanter or hip joint pain, 4/5 hip flexion on right, else 5/5 LE strength Skin: warm and dry, no rashes noted Neuro: alert and oriented, no focal deficits   Assessment & Plan:    Osteoarthritis of right hip History and physical exam consistent with right hip OA worst pain at the iliac crest but does have popping sensation with walking. She already is on Norco for chronic pain and states she cannot tolerate oral NSAIDS because of stomach upset - will give voltaren gel and much of her pain is right beneath the skin at the iliac crest - continue OTC tylenol - will follow with PCP     Mehlani Blankenburg A. Lincoln Brigham MD, [facility] Resident PGY-3 Pager [phone]

## 2016-05-29 NOTE — Telephone Encounter (Signed)
Patient states that she needs clearance from PCP to have dental surgery with Dr. Diona Browner. She is requesting we review her echo from 05/24/16 and call to Dr. Lupita Leash office to give the clearance.Dr.Jensen's  number is [phone].

## 2016-05-30 NOTE — Telephone Encounter (Signed)
Margaret Rangel with Dr. Marletta Lor office called to follow up on surgery clearance for oral dental surgery please call [phone].  Derl Barrow, RN

## 2016-05-30 NOTE — Telephone Encounter (Signed)
Spoke with Aldona Bar from Dr. Lupita Leash to update on the plan.

## 2016-05-30 NOTE — Telephone Encounter (Signed)
Spoke with Margaret Rangel and explained the information noted below.She agrees to be seen by cardiology.    Per chart review patient was seen in the ED on 11/22 for chest pain. ED physician wanted to admit for observation but patient declined and left AMA. When I returned her call on 05/11/16 regarding her request for surgical clearance to report to her that she needs to be evaluated by cardiology first, she stated tat she had an appointment with cardiology on 2/7. Now, I realize that she may have been referring to her ECHO appointment, not an actual visit with a cardiologist. It is unfortunate that this happened, but it is unsafe for patient to have the procedure done prior to being seen by cardiology. I will place a referral to cardiology now.

## 2016-05-31 DIAGNOSIS — M5440 Lumbago with sciatica, unspecified side: Secondary | ICD-10-CM | POA: Diagnosis not present

## 2016-05-31 DIAGNOSIS — M5116 Intervertebral disc disorders with radiculopathy, lumbar region: Secondary | ICD-10-CM | POA: Diagnosis not present

## 2016-06-06 ENCOUNTER — Encounter: Payer: Self-pay | Admitting: Cardiology

## 2016-06-06 ENCOUNTER — Ambulatory Visit (INDEPENDENT_AMBULATORY_CARE_PROVIDER_SITE_OTHER): Payer: Medicare Other | Admitting: Cardiology

## 2016-06-06 VITALS — BP 148/70 | HR 92 | Ht 63.0 in | Wt 149.6 lb

## 2016-06-06 DIAGNOSIS — R079 Chest pain, unspecified: Secondary | ICD-10-CM | POA: Diagnosis not present

## 2016-06-06 MED ORDER — CARVEDILOL 6.25 MG PO TABS: 6.2500 mg | ORAL_TABLET | Freq: Two times a day (BID) | ORAL | 3 refills | Status: DC

## 2016-06-06 NOTE — Progress Notes (Signed)
Electrophysiology Office Note   Date:  06/06/2016   ID:  Margaret, Rangel 12-20-1971, MRN [id number]  PCP:  Smiley Houseman, MD  Primary Electrophysiologist:  Constance Haw, MD    Chief Complaint  Patient presents with  . New Patient (Initial Visit)    chest pain     History of Present Illness: Margaret Rangel is a 71 y.o. female who presents today for electrophysiology evaluation.   She has a history of diabetes, hypertension, hyperlipidemia, and stroke 2. On 03/07/16, she presented to the emergency room with chest pain associated with shortness of breath, nausea, diaphoresis, and vomiting. She says that the pain occurred when she was lying in bed. The pain radiated up to her left arm. Her EKG the EMS showed she had diffuse ST depressions which were more pronounced than previous EKGs. Her pain resolved after full dose aspirin and nitroglycerin. The patient was asymptomatic in the emergency room. She quit smoking 3 months prior to her emergency room visit. The patient signed out of the emergency room at that time AMA.   Today, she denies symptoms of palpitations, chest pain, shortness of breath, orthopnea, PND, lower extremity edema, claudication, dizziness, presyncope, syncope, bleeding, or neurologic sequela. The patient is tolerating medications without difficulties and is otherwise without complaint today. He has not had any further episodes of chest pain since November.    Past Medical History:  Diagnosis Date  . Abdominal pain   . Anal fissure   . Arthritis   . Asthma   . Chronic back pain   . Colon adenoma 2009, 2012   Colonoscopy  . Diverticulosis of colon (without mention of hemorrhage) 2009   Colonoscopy  . DM (diabetes mellitus) ([place])    off medicines for diabetes 12-18-13  . GERD (gastroesophageal reflux disease)   . Hemorrhoids   . Hernia   . HTN (hypertension)   . Hyperlipidemia    low  . Irritable bowel syndrome (IBS)   . Low blood potassium    . Obese   . Stroke ([place])    x2  . Ulcer [facility])    Past Surgical History:  Procedure Laterality Date  . COLONOSCOPY    . FLEXIBLE SIGMOIDOSCOPY N/A 07/14/2012   Procedure: FLEXIBLE SIGMOIDOSCOPY;  Surgeon: Inda Castle, MD;  Location: [facility] ENDOSCOPY;  Service: Endoscopy;  Laterality: N/A;  . HEMORRHOID BANDING N/A 07/14/2012   Procedure: HEMORRHOID BANDING;  Surgeon: Inda Castle, MD;  Location: [facility] ENDOSCOPY;  Service: Endoscopy;  Laterality: N/A;  . PARTIAL HYSTERECTOMY  1971  . POLYPECTOMY    . TONSILLECTOMY       Current Outpatient Prescriptions  Medication Sig Dispense Refill  . albuterol (PROVENTIL HFA;VENTOLIN HFA) 108 (90 Base) MCG/ACT inhaler Inhale 2 puffs into the lungs every 6 (six) hours as needed for wheezing or shortness of breath. 1 Inhaler 2  . Calcium Carb-Cholecalciferol 600-800 MG-UNIT TABS Take one tablet daily 60 tablet 1  . clonazePAM (KLONOPIN) 0.5 MG tablet Take 1 tablet (0.5 mg total) by mouth daily as needed. For anxiety. 30 tablet 0  . diclofenac sodium (VOLTAREN) 1 % GEL Apply 2 g topically 4 (four) times daily. 100 g 0  . glimepiride (AMARYL) 2 MG tablet Take 1 tablet (2 mg total) by mouth daily with breakfast. 60 tablet 1  . HYDROcodone-acetaminophen (NORCO/VICODIN) 5-325 MG tablet Take 1 tablet by mouth every 6 (six) hours as needed. 10 tablet 0  . Melatonin 1 MG CAPS Take 1 capsule (  1 mg total) by mouth at bedtime. 30 capsule 0  . pantoprazole (PROTONIX) 40 MG tablet Take 1 tablet (40 mg total) by mouth daily. 90 tablet 1  . polyethylene glycol powder (GLYCOLAX/MIRALAX) powder Take 255 g by mouth daily. 255 g 0  . ibuprofen (ADVIL,MOTRIN) 800 MG tablet Take 800 mg by mouth daily as needed.     No current facility-administered medications for this visit.     Allergies:   Codeine and Penicillins   Social History:  The patient  reports that she has been smoking Cigarettes.  She has been smoking about 0.25 packs per day. She has never used smokeless  tobacco. She reports that she drinks about 12.6 oz of alcohol per week . She reports that she uses drugs, including Marijuana.   Family History:  The patient's family history includes Anuerysm in her father; Colon cancer in her brother; Heart disease in her mother; Lung cancer in her brother; Prostate cancer in her brother.    ROS:  Please see the history of present illness.   Otherwise, review of systems is positive for Waking up short of breath, leg pain, wheezing, cough, abdominal pain.   All other systems are reviewed and negative.    PHYSICAL EXAM: VS:  BP (!) 148/70   Pulse 92   Ht 5\' 3"  (1.6 m)   Wt 149 lb 9.6 oz (67.9 kg)   BMI 26.50 kg/m  , BMI Body mass index is 26.5 kg/m. GEN: Well nourished, well developed, in no acute distress  HEENT: normal  Neck: no JVD, carotid bruits, or masses Cardiac: RRR; no murmurs, rubs, or gallops,no edema  Respiratory:  clear to auscultation bilaterally, normal work of breathing GI: soft, nontender, nondistended, + BS MS: no deformity or atrophy  Skin: warm and dry Neuro:  Strength and sensation are intact Psych: euthymic mood, full affect  EKG:  EKG is not ordered today. Personal review of the ekg ordered 03/09/17 shows sinus rhythm, possible LVH, inferior ST depressions, lateral ST depressions  Recent Labs: 03/07/2016: Hemoglobin 10.3; Platelets 409 03/29/2016: ALT 19; BUN 8; Creat 0.86; Potassium 3.5; Sodium 139    Lipid Panel     Component Value Date/Time   CHOL (H) 06/15/2010 0530    239        ATP III CLASSIFICATION:  <200     mg/dL   Desirable  200-239  mg/dL   Borderline High  >=240    mg/dL   High          TRIG 565 (H) 06/15/2010 0530   HDL 38 (L) 06/15/2010 0530   CHOLHDL 6.3 06/15/2010 0530   VLDL UNABLE TO CALCULATE IF TRIGLYCERIDE OVER 400 mg/dL 06/15/2010 0530   LDLCALC  06/15/2010 0530    UNABLE TO CALCULATE IF TRIGLYCERIDE OVER 400 mg/dL        Total Cholesterol/HDL:CHD Risk Coronary Heart Disease Risk  Table                     Men   Women  1/2 Average Risk   3.4   3.3  Average Risk       5.0   4.4  2 X Average Risk   9.6   7.1  3 X Average Risk  23.4   11.0        Use the calculated Patient Ratio above and the CHD Risk Table to determine the patient's CHD Risk.        ATP III CLASSIFICATION (LDL):  <  100     mg/dL   Optimal  100-129  mg/dL   Near or Above                    Optimal  130-159  mg/dL   Borderline  160-189  mg/dL   High  >190     mg/dL   Very High     Wt Readings from Last 3 Encounters:  06/06/16 149 lb 9.6 oz (67.9 kg)  05/29/16 153 lb (69.4 kg)  03/29/16 152 lb (68.9 kg)      Other studies Reviewed: Additional studies/ records that were reviewed today include: TTE 05/24/16  Review of the above records today demonstrates:  - Left ventricle: The cavity size was normal. Wall thickness was   increased in a pattern of mild LVH. Systolic function was   vigorous. The estimated ejection fraction was in the range of 65%   to 70%. Wall motion was normal; there were no regional wall   motion abnormalities. Doppler parameters are consistent with   abnormal left ventricular relaxation (grade 1 diastolic   dysfunction). - Aortic valve: There was trivial regurgitation.   ASSESSMENT AND PLAN:  1.  Chest pain:Is unclear as to the cause of her chest pain back in November. She does have an EKG that showed dynamic ST changes with ST depression during pain. The pain did improve after nitroglycerin and aspirin administration. Due to that, we'll order a stress test to further determine if she has coronary disease. If her stress test is low risk, she would be low to intermediate risk for a low risk procedure of having her teeth pulled.  2. Hypertension:Pressure is elevated today. With her history of diastolic dysfunction, we'll work to treat blood pressure with Coreg 6.25 mg. This medication can be titrated by her primary physician.  3. Hyperlipidemia: Has had  hypertriglyceridemia on her lipid profile previously. Lipids have not been checked since 2012. We'll recheck today.    Current medicines are reviewed at length with the patient today.   The patient does not have concerns regarding her medicines.  The following changes were made today:  Coreg 6.25 mg BID  Labs/ tests ordered today include:  Orders Placed This Encounter  Procedures  . Myocardial Perfusion Imaging     Disposition:   FU with Wiliam Cauthorn pending stress testing  Signed, Raif Chachere Meredith Leeds, MD  06/06/2016 2:42 PM     [facility] [address] [place] [address] [phone] (office) [phone] (fax)

## 2016-06-06 NOTE — Patient Instructions (Addendum)
Medication Instructions:  Your physician has recommended you make the following change in your medication:  1) Carvedilol 6.25 mg twice daily   Labwork: Fasting Lipid Panel (schedule the morning when you come in for myoview)   Testing/Procedures: Your physician has requested that you have a lexiscan myoview.     Follow-Up: Follow-up to be determined after results of myoview.    Any Other Special Instructions Will Be Listed Below (If Applicable).     If you need a refill on your cardiac medications before your next appointment, please call your pharmacy.

## 2016-06-07 ENCOUNTER — Other Ambulatory Visit: Payer: Self-pay | Admitting: Internal Medicine

## 2016-06-07 NOTE — Telephone Encounter (Signed)
Patient is aware that script is ready for pick up but would like to wait until after her appt with cards next week before scheduling her follow up. Jazmin Hartsell,CMA

## 2016-06-07 NOTE — Telephone Encounter (Signed)
Rx Printed and placed at front desk for pick up.  Please ask patient to make a follow up appointment to discuss anxiety as we have not discussed this yet since she established at our clinic.

## 2016-06-11 ENCOUNTER — Telehealth (HOSPITAL_COMMUNITY): Payer: Self-pay | Admitting: *Deleted

## 2016-06-11 NOTE — Telephone Encounter (Signed)
Patient given detailed instructions per Myocardial Perfusion Study Information Sheet for the test on 06/13/16. Patient notified to arrive 15 minutes early and that it is imperative to arrive on time for appointment to keep from having the test rescheduled.  If you need to cancel or reschedule your appointment, please call the office within 24 hours of your appointment. Failure to do so may result in a cancellation of your appointment, and a $50 no show fee. Patient verbalized understanding. Kirstie Peri

## 2016-06-13 ENCOUNTER — Ambulatory Visit (HOSPITAL_COMMUNITY): Payer: Medicare Other | Attending: Cardiovascular Disease

## 2016-06-13 ENCOUNTER — Other Ambulatory Visit: Payer: Medicare Other | Admitting: *Deleted

## 2016-06-13 DIAGNOSIS — Z01818 Encounter for other preprocedural examination: Secondary | ICD-10-CM | POA: Diagnosis not present

## 2016-06-13 DIAGNOSIS — R0609 Other forms of dyspnea: Secondary | ICD-10-CM | POA: Diagnosis not present

## 2016-06-13 DIAGNOSIS — I10 Essential (primary) hypertension: Secondary | ICD-10-CM | POA: Diagnosis not present

## 2016-06-13 DIAGNOSIS — R079 Chest pain, unspecified: Secondary | ICD-10-CM | POA: Insufficient documentation

## 2016-06-13 DIAGNOSIS — R002 Palpitations: Secondary | ICD-10-CM | POA: Diagnosis not present

## 2016-06-13 DIAGNOSIS — Z8673 Personal history of transient ischemic attack (TIA), and cerebral infarction without residual deficits: Secondary | ICD-10-CM | POA: Diagnosis not present

## 2016-06-13 DIAGNOSIS — K648 Other hemorrhoids: Secondary | ICD-10-CM

## 2016-06-13 LAB — MYOCARDIAL PERFUSION IMAGING
LV dias vol: 80 mL (ref 46–106)
LV sys vol: 24 mL
Peak HR: 82 {beats}/min
RATE: 0.29
Rest HR: 67 {beats}/min
SDS: 2
SRS: 0
SSS: 2
TID: 0.93

## 2016-06-13 LAB — LIPID PANEL
Chol/HDL Ratio: 6.2 ratio units — ABNORMAL HIGH (ref 0.0–4.4)
Cholesterol, Total: 302 mg/dL — ABNORMAL HIGH (ref 100–199)
HDL: 49 mg/dL (ref >39)
Triglycerides: 518 mg/dL — ABNORMAL HIGH (ref 0–149)

## 2016-06-13 MED ORDER — REGADENOSON 0.4 MG/5ML IV SOLN
0.4000 mg | Freq: Once | INTRAVENOUS | Status: AC
  Administered 2016-06-13: 0.4 mg via INTRAVENOUS

## 2016-06-13 MED ORDER — TECHNETIUM TC 99M TETROFOSMIN IV KIT
30.4000 | PACK | Freq: Once | INTRAVENOUS | Status: AC | PRN
  Administered 2016-06-13: 30.4 via INTRAVENOUS
  Filled 2016-06-13: qty 31

## 2016-06-13 MED ORDER — TECHNETIUM TC 99M TETROFOSMIN IV KIT
10.5000 | PACK | Freq: Once | INTRAVENOUS | Status: AC | PRN
  Administered 2016-06-13: 10.5 via INTRAVENOUS
  Filled 2016-06-13: qty 11

## 2016-06-13 NOTE — Addendum Note (Signed)
Addended by: Eulis Foster on: 06/13/2016 08:06 AM   Modules accepted: Orders

## 2016-06-15 ENCOUNTER — Telehealth: Payer: Self-pay | Admitting: *Deleted

## 2016-06-15 MED ORDER — ROSUVASTATIN CALCIUM 40 MG PO TABS: 40.0000 mg | ORAL_TABLET | Freq: Every day | ORAL | 6 refills | Status: DC

## 2016-06-15 NOTE — Telephone Encounter (Signed)
Pt is agreeable to plan.

## 2016-06-15 NOTE — Telephone Encounter (Signed)
-----   Message from Will Meredith Leeds, MD sent at 06/14/2016  8:48 AM EST ----- Triglycerides are elevated. Needs to start Crestor 40 mg with recheck in 3 months fasting.

## 2016-06-20 ENCOUNTER — Telehealth: Payer: Self-pay | Admitting: Internal Medicine

## 2016-06-20 NOTE — Telephone Encounter (Signed)
Pt needs surgical clearance for oral surgery sent over to [phone]. ep

## 2016-06-21 NOTE — Telephone Encounter (Signed)
To whom it may concern,   Ms. Laver was evaluated by cardiology on 06/06/2016 prior to her dental surgery as she had an episode of chest pain in 02/2016. She had a stress test in 05/2016 which was a low risk stress test. Since her stress test was low risk, patient would be low to intermediate risk for a low risk procedure of having her teeth pulled.   Sincerely    Dennie Fetters, MD   Letter printed and placed on the to fax box with the number given by [place].

## 2016-06-26 DIAGNOSIS — M5440 Lumbago with sciatica, unspecified side: Secondary | ICD-10-CM | POA: Diagnosis not present

## 2016-06-26 DIAGNOSIS — M25511 Pain in right shoulder: Secondary | ICD-10-CM | POA: Diagnosis not present

## 2016-06-26 DIAGNOSIS — I10 Essential (primary) hypertension: Secondary | ICD-10-CM | POA: Diagnosis not present

## 2016-08-09 ENCOUNTER — Telehealth: Payer: Self-pay

## 2016-08-09 NOTE — Telephone Encounter (Signed)
Pt has a chest cold, sinus pain and pressure. What OTC meds would you recommend her to take. Ottis Stain, CMA

## 2016-08-13 NOTE — Telephone Encounter (Signed)
I apologize for the delayed reply. She can take Dayquil cold and flu (without the decongestant). This medicine has Tylenol in it, so please inform patient to not take other medications with Tylenol in it (Tylenol is also called acetaminophen).

## 2016-08-30 DIAGNOSIS — M5441 Lumbago with sciatica, right side: Secondary | ICD-10-CM | POA: Diagnosis not present

## 2016-08-30 DIAGNOSIS — M5440 Lumbago with sciatica, unspecified side: Secondary | ICD-10-CM | POA: Diagnosis not present

## 2016-08-30 NOTE — Telephone Encounter (Signed)
Pt seeing Dr. Dallas Schimke on 5/21.

## 2016-09-03 ENCOUNTER — Ambulatory Visit: Payer: Medicare Other | Admitting: Internal Medicine

## 2016-09-03 NOTE — Progress Notes (Deleted)
   [facility] Phone: [phone]   Date of Visit: 09/03/2016   HPI:  DM2:  ROS: See HPI.  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 Gen: *** HEENT: *** Heart: *** Lungs: *** Neuro: *** Ext: ***  ASSESSMENT/PLAN:  Health maintenance:  -***  No problem-specific Assessment & Plan notes found for this encounter.  FOLLOW UP: Follow up in *** for ***  Smiley Houseman, MD PGY [place]

## 2016-09-06 ENCOUNTER — Telehealth: Payer: Self-pay | Admitting: Internal Medicine

## 2016-09-06 ENCOUNTER — Encounter (HOSPITAL_COMMUNITY): Payer: Self-pay | Admitting: *Deleted

## 2016-09-06 ENCOUNTER — Emergency Department (HOSPITAL_COMMUNITY)
Admission: EM | Admit: 2016-09-06 | Discharge: 2016-09-06 | Disposition: A | Discharge status: Home / Self Care | Payer: Medicare Other | Attending: Emergency Medicine | Admitting: Emergency Medicine

## 2016-09-06 ENCOUNTER — Emergency Department (HOSPITAL_COMMUNITY): Payer: Medicare Other

## 2016-09-06 DIAGNOSIS — Z8673 Personal history of transient ischemic attack (TIA), and cerebral infarction without residual deficits: Secondary | ICD-10-CM | POA: Insufficient documentation

## 2016-09-06 DIAGNOSIS — I10 Essential (primary) hypertension: Secondary | ICD-10-CM | POA: Diagnosis not present

## 2016-09-06 DIAGNOSIS — F17210 Nicotine dependence, cigarettes, uncomplicated: Secondary | ICD-10-CM | POA: Diagnosis not present

## 2016-09-06 DIAGNOSIS — R1084 Generalized abdominal pain: Secondary | ICD-10-CM | POA: Diagnosis not present

## 2016-09-06 DIAGNOSIS — N289 Disorder of kidney and ureter, unspecified: Secondary | ICD-10-CM | POA: Insufficient documentation

## 2016-09-06 DIAGNOSIS — Z7984 Long term (current) use of oral hypoglycemic drugs: Secondary | ICD-10-CM | POA: Insufficient documentation

## 2016-09-06 DIAGNOSIS — E876 Hypokalemia: Secondary | ICD-10-CM | POA: Insufficient documentation

## 2016-09-06 DIAGNOSIS — J45909 Unspecified asthma, uncomplicated: Secondary | ICD-10-CM | POA: Diagnosis not present

## 2016-09-06 DIAGNOSIS — K2970 Gastritis, unspecified, without bleeding: Secondary | ICD-10-CM | POA: Diagnosis not present

## 2016-09-06 DIAGNOSIS — Z79899 Other long term (current) drug therapy: Secondary | ICD-10-CM | POA: Diagnosis not present

## 2016-09-06 DIAGNOSIS — R197 Diarrhea, unspecified: Secondary | ICD-10-CM | POA: Diagnosis not present

## 2016-09-06 DIAGNOSIS — E119 Type 2 diabetes mellitus without complications: Secondary | ICD-10-CM | POA: Diagnosis not present

## 2016-09-06 DIAGNOSIS — R109 Unspecified abdominal pain: Secondary | ICD-10-CM | POA: Diagnosis not present

## 2016-09-06 LAB — TROPONIN I: Troponin I: <0.03 ng/mL (ref <0.03)

## 2016-09-06 LAB — CBC WITH DIFFERENTIAL/PLATELET
Basophils Absolute: 0 10*3/uL (ref 0.0–0.1)
Basophils Relative: 0 %
Eosinophils Absolute: 0.2 10*3/uL (ref 0.0–0.7)
Eosinophils Relative: 2 %
HCT: 32.4 % — ABNORMAL LOW (ref 36.0–46.0)
Hemoglobin: 10.2 g/dL — ABNORMAL LOW (ref 12.0–15.0)
Lymphocytes Relative: 41 %
Lymphs Abs: 4.5 10*3/uL — ABNORMAL HIGH (ref 0.7–4.0)
MCH: 22.4 pg — ABNORMAL LOW (ref 26.0–34.0)
MCHC: 31.5 g/dL (ref 30.0–36.0)
MCV: 71.2 fL — ABNORMAL LOW (ref 78.0–100.0)
Monocytes Absolute: 0.9 10*3/uL (ref 0.1–1.0)
Monocytes Relative: 9 %
Neutro Abs: 5.4 10*3/uL (ref 1.7–7.7)
Neutrophils Relative %: 48 %
Platelets: 272 10*3/uL (ref 150–400)
RBC: 4.55 MIL/uL (ref 3.87–5.11)
RDW: 18.5 % — ABNORMAL HIGH (ref 11.5–15.5)
WBC: 11.1 10*3/uL — ABNORMAL HIGH (ref 4.0–10.5)

## 2016-09-06 LAB — COMPREHENSIVE METABOLIC PANEL
ALT: 91 U/L — ABNORMAL HIGH (ref 14–54)
AST: 73 U/L — ABNORMAL HIGH (ref 15–41)
Albumin: 3.7 g/dL (ref 3.5–5.0)
Alkaline Phosphatase: 109 U/L (ref 38–126)
Anion gap: 12 (ref 5–15)
BUN: 10 mg/dL (ref 6–20)
CO2: 23 mmol/L (ref 22–32)
Calcium: 8.9 mg/dL (ref 8.9–10.3)
Chloride: 107 mmol/L (ref 101–111)
Creatinine, Ser: 1.19 mg/dL — ABNORMAL HIGH (ref 0.44–1.00)
GFR calc Af Amer: 52 mL/min — ABNORMAL LOW (ref >60)
GFR calc non Af Amer: 45 mL/min — ABNORMAL LOW (ref >60)
Glucose, Bld: 97 mg/dL (ref 65–99)
Potassium: 2.7 mmol/L — CL (ref 3.5–5.1)
Sodium: 142 mmol/L (ref 135–145)
Total Bilirubin: 0.4 mg/dL (ref 0.3–1.2)
Total Protein: 6.9 g/dL (ref 6.5–8.1)

## 2016-09-06 LAB — URINALYSIS, ROUTINE W REFLEX MICROSCOPIC
Bilirubin Urine: NEGATIVE
Glucose, UA: NEGATIVE mg/dL
Ketones, ur: NEGATIVE mg/dL
Leukocytes, UA: NEGATIVE
Nitrite: NEGATIVE
Protein, ur: 30 mg/dL — AB
RBC / HPF: NONE SEEN RBC/hpf (ref 0–5)
Specific Gravity, Urine: 1.004 — ABNORMAL LOW (ref 1.005–1.030)
pH: 6 (ref 5.0–8.0)

## 2016-09-06 LAB — LIPASE, BLOOD: Lipase: 22 U/L (ref 11–51)

## 2016-09-06 MED ORDER — IOPAMIDOL (ISOVUE-300) INJECTION 61%
INTRAVENOUS | Status: AC
  Administered 2016-09-06: 100 mL
  Filled 2016-09-06: qty 100

## 2016-09-06 MED ORDER — FENTANYL CITRATE (PF) 100 MCG/2ML IJ SOLN
100.0000 ug | Freq: Once | INTRAMUSCULAR | Status: AC
  Administered 2016-09-06: 100 ug via INTRAVENOUS
  Filled 2016-09-06: qty 2

## 2016-09-06 MED ORDER — POTASSIUM CHLORIDE CRYS ER 20 MEQ PO TBCR: 20.0000 meq | EXTENDED_RELEASE_TABLET | Freq: Every day | ORAL | 0 refills | Status: DC

## 2016-09-06 MED ORDER — METOCLOPRAMIDE HCL 5 MG/ML IJ SOLN
5.0000 mg | Freq: Once | INTRAMUSCULAR | Status: AC
  Administered 2016-09-06: 5 mg via INTRAVENOUS
  Filled 2016-09-06: qty 2

## 2016-09-06 MED ORDER — POTASSIUM CHLORIDE CRYS ER 20 MEQ PO TBCR
40.0000 meq | EXTENDED_RELEASE_TABLET | Freq: Once | ORAL | Status: AC
  Administered 2016-09-06: 40 meq via ORAL
  Filled 2016-09-06: qty 2

## 2016-09-06 NOTE — ED Triage Notes (Signed)
Pt arrives from home via [facility]. Pt has c/o stabbing right flank pain that radiates around to the RLQ, up to the epigastrium and to the central chest. Pt endorses diarrhea this am. Pt received 134mcg fentanyl pta which significantly reduced her pain. Dr. Lenna Sciara aware.

## 2016-09-06 NOTE — Discharge Instructions (Signed)
Keep your scheduled appointment with your primary care physician on 09/14/2016. Ask your doctor to recheck your blood potassium. Today's was low at 2.7. Your blood pressure should be rechecked at your next doctor visit. Today's was elevated at 176/52

## 2016-09-06 NOTE — ED Notes (Signed)
Patient transported to CT 

## 2016-09-06 NOTE — ED Provider Notes (Signed)
[facility] DEPT Provider Note   CSN: [id number] Arrival date & time: 09/06/16  1221     History   Chief Complaint Chief Complaint  Patient presents with  . Abdominal Pain    HPI Margaret Rangel is a 71 y.o. female.Complains of diffuse abdominal pain which starts at right lower quadrant and radiates to upper abdomen onset 10:45 AM today. Symptoms accompanied by nausea, no vomiting she had one episode of diarrhea earlier today. Pain is constant and severe. Nothing makes symptoms better or worse retrieved by EMS with fentanyl 100 g IV prior to arrival with transient relief of discomfort.  HPI  Past Medical History:  Diagnosis Date  . Abdominal pain   . Anal fissure   . Arthritis   . Asthma   . Chronic back pain   . Colon adenoma 2009, 2012   Colonoscopy  . Diverticulosis of colon (without mention of hemorrhage) 2009   Colonoscopy  . DM (diabetes mellitus) ([place])    off medicines for diabetes 12-18-13  . GERD (gastroesophageal reflux disease)   . Hemorrhoids   . Hernia   . HTN (hypertension)   . Hyperlipidemia    low  . Irritable bowel syndrome (IBS)   . Low blood potassium   . Obese   . Stroke ([place])    x2  . Ulcer     Patient Active Problem List   Diagnosis Date Noted  . Osteoarthritis of right hip 05/29/2016  . Osteoporosis 05/01/2016  . Systolic murmur 28/36/6294  . Essential hypertension 04/02/2016  . Chest pain with moderate risk for cardiac etiology 03/07/2016  . History of esophageal stricture 03/01/2016  . Rectal bleeding 02/02/2014  . Anal fissure 02/02/2014  . Esophageal reflux 02/02/2014  . Internal hemorrhoids 02/02/2014  . Personal history of colonic polyps 12/09/2013  . Dysphagia, unspecified(787.20) 09/11/2012  . Nausea alone 09/11/2012  . Abdominal pain, left lower quadrant 04/24/2011  . Diabetes mellitus without mention of complication 76/54/6503    Past Surgical History:  Procedure Laterality Date  . COLONOSCOPY    . FLEXIBLE  SIGMOIDOSCOPY N/A 07/14/2012   Procedure: FLEXIBLE SIGMOIDOSCOPY;  Surgeon: Inda Castle, MD;  Location: [facility];  Service: Endoscopy;  Laterality: N/A;  . HEMORRHOID BANDING N/A 07/14/2012   Procedure: HEMORRHOID BANDING;  Surgeon: Inda Castle, MD;  Location: [facility];  Service: Endoscopy;  Laterality: N/A;  . PARTIAL HYSTERECTOMY  1971  . POLYPECTOMY    . TONSILLECTOMY      OB History    No data available       Home Medications    Prior to Admission medications   Medication Sig Start Date End Date Taking? Authorizing Provider  albuterol (PROVENTIL HFA;VENTOLIN HFA) 108 (90 Base) MCG/ACT inhaler Inhale 2 puffs into the lungs every 6 (six) hours as needed for wheezing or shortness of breath. 05/29/16   Veatrice Bourbon, MD  Calcium Carb-Cholecalciferol 600-800 MG-UNIT TABS Take one tablet daily 05/29/16   Haney, Yetta Flock A, MD  carvedilol (COREG) 6.25 MG tablet Take 1 tablet (6.25 mg total) by mouth 2 (two) times daily. 06/06/16   Camnitz, Will Hassell Done, MD  clonazePAM (KLONOPIN) 0.5 MG tablet TAKE ONE TABLET BY MOUTH ONCE DAILY AS NEEDED FOR ANXIETY 06/07/16   Smiley Houseman, MD  diclofenac sodium (VOLTAREN) 1 % GEL Apply 2 g topically 4 (four) times daily. 05/29/16   Haney, Yetta Flock A, MD  glimepiride (AMARYL) 2 MG tablet Take 1 tablet (2 mg total) by mouth daily with breakfast.  05/29/16   Veatrice Bourbon, MD  HYDROcodone-acetaminophen (NORCO/VICODIN) 5-325 MG tablet Take 1 tablet by mouth every 6 (six) hours as needed. 05/15/15   Drenda Freeze, MD  ibuprofen (ADVIL,MOTRIN) 800 MG tablet Take 800 mg by mouth daily as needed. 05/29/16   [provider]  Melatonin 1 MG CAPS Take 1 capsule (1 mg total) by mouth at bedtime. 03/02/16   Smiley Houseman, MD  pantoprazole (PROTONIX) 40 MG tablet Take 1 tablet (40 mg total) by mouth daily. 05/29/16   Haney, Yetta Flock A, MD  polyethylene glycol powder (GLYCOLAX/MIRALAX) powder Take 255 g by mouth daily. 06/07/15   Doran Stabler, MD  rosuvastatin (CRESTOR) 40 MG tablet Take 1 tablet (40 mg total) by mouth daily. 06/15/16   Camnitz, Ocie Doyne, MD    Family History Family History  Problem Relation Age of Onset  . Prostate cancer Brother   . Heart disease Mother   . Anuerysm Father   . Lung cancer Brother   . Colon cancer Brother   . Rectal cancer Neg Hx   . Stomach cancer Neg Hx     Social History Social History  Substance Use Topics  . Smoking status: Current Some Day Smoker    Packs/day: 0.25    Types: Cigarettes  . Smokeless tobacco: Never Used  . Alcohol use 12.6 oz/week    21 Cans of beer per week     Allergies   Codeine and Penicillins   Review of Systems Review of Systems  Gastrointestinal: Positive for abdominal pain, diarrhea and nausea.     Physical Exam Updated Vital Signs BP (!) 159/65 (BP Location: Right Arm)   Pulse 80   Temp 98 F (36.7 C) (Oral)   Resp 18   Ht 5\' 3"  (1.6 m)   Wt 65.8 kg (145 lb)   SpO2 98%   BMI 25.69 kg/m   Physical Exam  Constitutional: She appears well-developed and well-nourished. She appears distressed.  Appears uncomfortable  HENT:  Head: Normocephalic and atraumatic.  Eyes: Conjunctivae are normal. Pupils are equal, round, and reactive to light.  Neck: Neck supple. No tracheal deviation present. No thyromegaly present.  Cardiovascular: Normal rate and regular rhythm.   No murmur heard. Pulmonary/Chest: Effort normal and breath sounds normal.  Abdominal: Soft. Bowel sounds are normal. She exhibits no distension and no mass. There is no rebound and no guarding. No hernia.  Obese, infraumbilical surgical scar, diffuse tenderness  Musculoskeletal: Normal range of motion. She exhibits no edema or tenderness.  Neurological: She is alert. Coordination normal.  Skin: Skin is warm and dry. No rash noted.  Psychiatric: She has a normal mood and affect.  Nursing note and vitals reviewed.    ED Treatments / Results  Labs (all labs  ordered are listed, but only abnormal results are displayed) Labs Reviewed - No data to display  EKG  EKG Interpretation  Date/Time:  Thursday Sep 06 2016 12:27:33 EDT Ventricular Rate:  80 PR Interval:    QRS Duration: 80 QT Interval:  418 QTC Calculation: 483 R Axis:   32 Text Interpretation:  Sinus rhythm Ventricular premature complex Probable left atrial enlargement Repol abnrm suggests ischemia, anterolateral No significant change since last tracing Confirmed by Orlie Dakin [phone]) on 09/06/2016 3:34:37 PM       Radiology No results found.  Procedures Procedures (including critical care time)  Medications Ordered in ED Medications - No data to display Results for orders placed or performed during  the hospital encounter of 09/06/16  Urinalysis, Routine w reflex microscopic  Result Value Ref Range   Color, Urine STRAW (A) YELLOW   APPearance CLEAR CLEAR   Specific Gravity, Urine 1.004 (L) 1.005 - 1.030   pH 6.0 5.0 - 8.0   Glucose, UA NEGATIVE NEGATIVE mg/dL   Hgb urine dipstick SMALL (A) NEGATIVE   Bilirubin Urine NEGATIVE NEGATIVE   Ketones, ur NEGATIVE NEGATIVE mg/dL   Protein, ur 30 (A) NEGATIVE mg/dL   Nitrite NEGATIVE NEGATIVE   Leukocytes, UA NEGATIVE NEGATIVE   RBC / HPF NONE SEEN 0 - 5 RBC/hpf   WBC, UA 0-5 0 - 5 WBC/hpf   Bacteria, UA RARE (A) NONE SEEN   Squamous Epithelial / LPF 0-5 (A) NONE SEEN  Comprehensive metabolic panel  Result Value Ref Range   Sodium 142 135 - 145 mmol/L   Potassium 2.7 (LL) 3.5 - 5.1 mmol/L   Chloride 107 101 - 111 mmol/L   CO2 23 22 - 32 mmol/L   Glucose, Bld 97 65 - 99 mg/dL   BUN 10 6 - 20 mg/dL   Creatinine, Ser 1.19 (H) 0.44 - 1.00 mg/dL   Calcium 8.9 8.9 - 10.3 mg/dL   Total Protein 6.9 6.5 - 8.1 g/dL   Albumin 3.7 3.5 - 5.0 g/dL   AST 73 (H) 15 - 41 U/L   ALT 91 (H) 14 - 54 U/L   Alkaline Phosphatase 109 38 - 126 U/L   Total Bilirubin 0.4 0.3 - 1.2 mg/dL   GFR calc non Af Amer 45 (L) >60 mL/min   GFR calc  Af Amer 52 (L) >60 mL/min   Anion gap 12 5 - 15  CBC with Differential/Platelet  Result Value Ref Range   WBC 11.1 (H) 4.0 - 10.5 K/uL   RBC 4.55 3.87 - 5.11 MIL/uL   Hemoglobin 10.2 (L) 12.0 - 15.0 g/dL   HCT 32.4 (L) 36.0 - 46.0 %   MCV 71.2 (L) 78.0 - 100.0 fL   MCH 22.4 (L) 26.0 - 34.0 pg   MCHC 31.5 30.0 - 36.0 g/dL   RDW 18.5 (H) 11.5 - 15.5 %   Platelets 272 150 - 400 K/uL   Neutrophils Relative % 48 %   Neutro Abs 5.4 1.7 - 7.7 K/uL   Lymphocytes Relative 41 %   Lymphs Abs 4.5 (H) 0.7 - 4.0 K/uL   Monocytes Relative 9 %   Monocytes Absolute 0.9 0.1 - 1.0 K/uL   Eosinophils Relative 2 %   Eosinophils Absolute 0.2 0.0 - 0.7 K/uL   Basophils Relative 0 %   Basophils Absolute 0.0 0.0 - 0.1 K/uL  Lipase, blood  Result Value Ref Range   Lipase 22 11 - 51 U/L  Troponin I  Result Value Ref Range   Troponin I <0.03 <0.03 ng/mL   Ct Abdomen Pelvis W Contrast  Result Date: 09/06/2016 CLINICAL DATA:  Acute onset abdominal pain with nausea and bloating today. EXAM: CT ABDOMEN AND PELVIS WITH CONTRAST TECHNIQUE: Multidetector CT imaging of the abdomen and pelvis was performed using the standard protocol following bolus administration of intravenous contrast. CONTRAST:  100 ml ISOVUE-300 IOPAMIDOL (ISOVUE-300) INJECTION 61% COMPARISON:  CT abdomen and pelvis 05/16/2015. FINDINGS: Lower chest: Lung bases are clear. No pleural or pericardial effusion. Hepatobiliary: 2-3 tiny hypoattenuating lesions in the liver most consistent with cysts are identified. The liver is otherwise unremarkable. Gallbladder and biliary tree appear normal. Pancreas: Unremarkable. No pancreatic ductal dilatation or surrounding inflammatory changes. Spleen:  Normal in size without focal abnormality. Adrenals/Urinary Tract: Adrenal glands are unremarkable. Kidneys are normal, without renal calculi, focal lesion, or hydronephrosis. Bladder is unremarkable. Stomach/Bowel: Scattered diverticula are seen about the descending  and sigmoid colon. No diverticulitis. The stomach, small bowel and appendix appear normal. Vascular/Lymphatic: Extensive aortoiliac atherosclerosis without aneurysm is identified. No lymphadenopathy. Reproductive: Status post hysterectomy. No adnexal masses. Other: No fluid collection. Very small fat containing umbilical hernia noted. Musculoskeletal: Loss of disc space height and vacuum disc phenomenon are seen at L2-3, L3-4 and L4-5. No fracture or worrisome bony lesion is identified. IMPRESSION: No acute abnormality or finding to explain the patient's symptoms. Atherosclerosis. Diverticulosis without diverticulitis. Status post hysterectomy. Electronically Signed   By: Inge Rise M.D.   On: 09/06/2016 15:40    Initial Impression / Assessment and Plan / ED Course  I have reviewed the triage vital signs and the nursing notes.  Pertinent labs & imaging results that were available during my care of the patient were reviewed by me and considered in my medical decision making (see chart for details).   2:40 PM pain and nausea improved after treatment with intravenous fentanyl and intravenous Reglan  5:05 PM patient remains asymptomatic several hours after treatment with IV fentanyl and intravenous Reglan and oral potassium. Plan prescription K Dur. She is to keep scheduled appointment with her PCP at [facility] next week your blood pressure recheck.   Final Clinical Impressions(s) / ED Diagnoses  Dx. #1 generalized abdominal pain #2 hypokalemia Final diagnoses:  None  #3 mild renal insufficiency #4 elevated blood pressure New Prescriptions New Prescriptions   No medications on file     Orlie Dakin, MD 09/06/16 1715

## 2016-09-06 NOTE — Telephone Encounter (Signed)
Called 2x, it rang once and went straight to VM each time. Left message 2nd time asking if she would like to schedule a AWV and call back to discuss that it entails.  - Margaret Rangel

## 2016-09-07 ENCOUNTER — Telehealth: Payer: Self-pay | Admitting: Internal Medicine

## 2016-09-07 NOTE — Telephone Encounter (Signed)
Called patient. Reports that she has a lot of abdominal pain. I told her that I am unable to prescribe any pain medication over the phone since I did not see her for this issue. I offered to make an appointment with Dr. Brita Romp today for a same day visit. She reports she needs to find a ride and will call back the clinic.

## 2016-09-07 NOTE — Telephone Encounter (Signed)
Patient called back, she states she will make the AWV appt when she comes in on 6/1 to see her PCP.

## 2016-09-07 NOTE — Telephone Encounter (Signed)
Patient calls, was seen in the ED yesterday and was told that her potassium was low. ED MD started her back on potassium supplements but only gave her a 4 day supply. Will need refills, please advise.

## 2016-09-07 NOTE — Telephone Encounter (Signed)
Patient calls back, now requesting something for the abdominal pain. States she was not given anything for her pain yesterday at the ED. Please advise.

## 2016-09-13 DIAGNOSIS — K5730 Diverticulosis of large intestine without perforation or abscess without bleeding: Secondary | ICD-10-CM | POA: Insufficient documentation

## 2016-09-13 DIAGNOSIS — Z8673 Personal history of transient ischemic attack (TIA), and cerebral infarction without residual deficits: Secondary | ICD-10-CM | POA: Insufficient documentation

## 2016-09-13 NOTE — Progress Notes (Signed)
[facility] Phone: [phone]   Date of Visit: 09/14/2016   HPI:  Abdominal Pain/Right Lower Back Pain:  - reports of right lower back pain with radiation to the RLQ which started suddenly while patient was sitting and watching TV on 5/24. Pain was so severe that she had to call EMS.  - she denies trauma/injury. Reports she increased her physical activity in the past 3 months or so. - describes this pain as a sharp burning pain that is constant  - reports that several months ago, she had similar symptoms and was told it was due to her arthritis and was given Hydrocodone  - also reports of diffuse abdominal pain for about 1 month which worsens with meals.  Right leg numbness intermittently for the past 2 months. She also has right leg weakness which she reports is chronic. Notes of diarrhea with stool incontinence and intermittent urinary retention for the past month. Reports she has multiple episodes of diarrhea in the morning (non-bloody). Denies history of constipation. Reports her stools were normal prior to this. Reports that every times she eats she feels that she has to "run to the bathroom". Reports her pain decrease in intensity by having a bowel movement.  - reports of urinary frequency x 1 month. Denies any dysuria, suprapubic tenderness, fevers, or chills. Does have history of kidney stones but symptoms are different this time.  - she was seen in the ED on 5/24. She was noted to have hypokalemia and mildly elevated AST and ALT. Lipase was normal. UA was unremarkable. CT abdomen and Pelvis did not show any acute processes to explain her symptoms. She was sent home with Tramadol.  - reports Tramadol helps with her pain.  - reports she has an appointment with her GI doctor this month. Per chart review, has been seen by GI for chronic abdominal pain last seen in 03/2016. Per note from 05/2015, symptoms thought to be possibly due to MSK (as work up thus far was  negative) vs alcohol abuse (I did not get to discuss this with patient).  - per chart review, MRI lumbar spine in 02/2013:Progressive disc protrusions at L2-3, L3-4, and L4-5 with new extruded fragments at L2-3 and L3-4 into the left lateral recesses. Increased moderate spinal stenosis at L3-4 and to a lesser degree at L2-3  ROS: See HPI.  [place]:  HTN Hx Stroke Internal Hemorrhoids, Anal Fissure, Hx of Esophageal Stricture GER Diverticulosis of the Colon DM Osteoporosis OA of right Hip   PHYSICAL EXAM: BP (!) 154/82   Pulse 67   Temp 98.4 F (36.9 C) (Oral)   Wt 157 lb (71.2 kg)   SpO2 98%   BMI 27.81 kg/m  GEN: intermittently in pain  CV: RRR, no murmurs, rubs, or gallops PULM: CTAB, normal effort ABD: Soft, TTP diffusely with the most pain in the RLQ without guarding or rebound, NABS, no organomegaly MSK: TTP at midline lumbar spine and right lower paraspinal muscles of the lumbar region. Pain with passive movement of the right hip. Pain with faber and fadir.  Neuro: reports of decreased sensation of the right lower extremity compared to the left. 5/5 strength of the upper extremities. 5/5 strength in the left lower extremity. 4-5/5 strength in the right lower extremity (likely due to pain), normal patellar reflexes.  SKIN: No rash or cyanosis; warm and well-perfused EXTR: No lower extremity edema or calf tenderness PSYCH: Mood and affect euthymic, normal rate and volume of speech  ASSESSMENT/PLAN: Hypokalemia: noted in the ED  - CMP   Mildly Elevated AST/ALT: noted in the ED - CMP  Back Pain/Right Hip Pain: likely an exacerbation of OA, however notes of intermittent numbness of right leg - MRI Lumbar Spine - Tramadol PRN   Abdominal Pain: seems to be a chronic issue with notes mentioning similar symptoms over 1 year ago. Seen by GI and at that time thought to be MSK related. Ischemic Colitis should also be considered. No sign of mesenteric infarction clinically.  Already has a follow up with GI.   Smiley Houseman, MD PGY [place]

## 2016-09-14 ENCOUNTER — Encounter: Payer: Self-pay | Admitting: Internal Medicine

## 2016-09-14 ENCOUNTER — Ambulatory Visit (INDEPENDENT_AMBULATORY_CARE_PROVIDER_SITE_OTHER): Payer: Medicare Other | Admitting: Internal Medicine

## 2016-09-14 VITALS — BP 154/82 | HR 67 | Temp 98.4°F | Wt 157.0 lb

## 2016-09-14 DIAGNOSIS — G8929 Other chronic pain: Secondary | ICD-10-CM | POA: Diagnosis not present

## 2016-09-14 DIAGNOSIS — E118 Type 2 diabetes mellitus with unspecified complications: Secondary | ICD-10-CM | POA: Diagnosis not present

## 2016-09-14 DIAGNOSIS — E876 Hypokalemia: Secondary | ICD-10-CM | POA: Diagnosis not present

## 2016-09-14 DIAGNOSIS — M545 Low back pain, unspecified: Secondary | ICD-10-CM

## 2016-09-14 DIAGNOSIS — M25551 Pain in right hip: Secondary | ICD-10-CM | POA: Diagnosis not present

## 2016-09-14 DIAGNOSIS — R109 Unspecified abdominal pain: Secondary | ICD-10-CM

## 2016-09-14 LAB — POCT GLYCOSYLATED HEMOGLOBIN (HGB A1C): Hemoglobin A1C: 6.3

## 2016-09-14 MED ORDER — TRAMADOL HCL 50 MG PO TABS: 50.0000 mg | ORAL_TABLET | Freq: Two times a day (BID) | ORAL | 0 refills | Status: DC | PRN

## 2016-09-14 NOTE — Patient Instructions (Addendum)
Please keep your appointment with the stomach doctors.   We will get imaging and lab work today.  Use tramadol as needed for pain

## 2016-09-15 LAB — COMPREHENSIVE METABOLIC PANEL
ALT: 21 IU/L (ref 0–32)
AST: 19 IU/L (ref 0–40)
Albumin/Globulin Ratio: 1.6 (ref 1.2–2.2)
Albumin: 4.1 g/dL (ref 3.5–4.8)
Alkaline Phosphatase: 98 IU/L (ref 39–117)
BUN/Creatinine Ratio: 8 — ABNORMAL LOW (ref 12–28)
BUN: 8 mg/dL (ref 8–27)
Bilirubin Total: 0.2 mg/dL (ref 0.0–1.2)
CO2: 22 mmol/L (ref 18–29)
Calcium: 8.9 mg/dL (ref 8.7–10.3)
Chloride: 107 mmol/L — ABNORMAL HIGH (ref 96–106)
Creatinine, Ser: 1.02 mg/dL — ABNORMAL HIGH (ref 0.57–1.00)
GFR calc Af Amer: 64 mL/min/{1.73_m2} (ref >59)
GFR calc non Af Amer: 55 mL/min/{1.73_m2} — ABNORMAL LOW (ref >59)
Globulin, Total: 2.5 g/dL (ref 1.5–4.5)
Glucose: 99 mg/dL (ref 65–99)
Potassium: 3.5 mmol/L (ref 3.5–5.2)
Sodium: 142 mmol/L (ref 134–144)
Total Protein: 6.6 g/dL (ref 6.0–8.5)

## 2016-09-16 IMAGING — US US RENAL
1 series · 14 of 25 positions shown · non-contrast
Comparison: 01/31/2014 CT.

CLINICAL DATA: Right flank pain

EXAM:
RENAL / URINARY TRACT ULTRASOUND COMPLETE

[Series 1: us renal · 0.23mm/px · 14 of 58 slices shown]
[im 1/58]
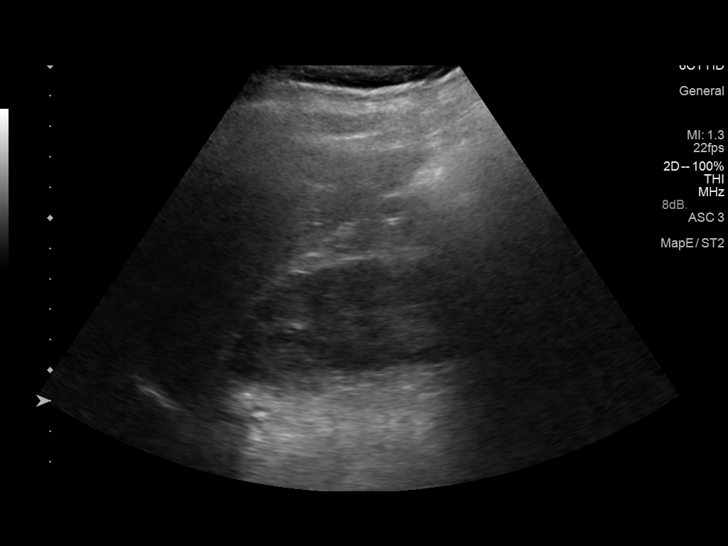
[im 5/58]
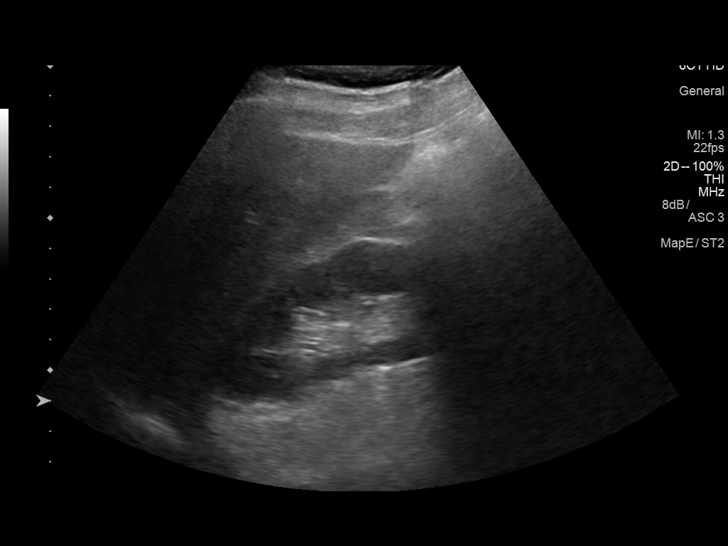
[im 10/58]
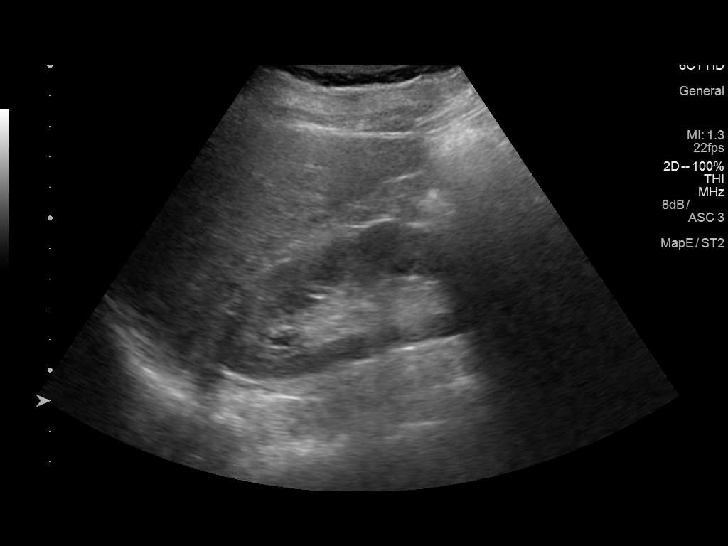
[im 15/58]
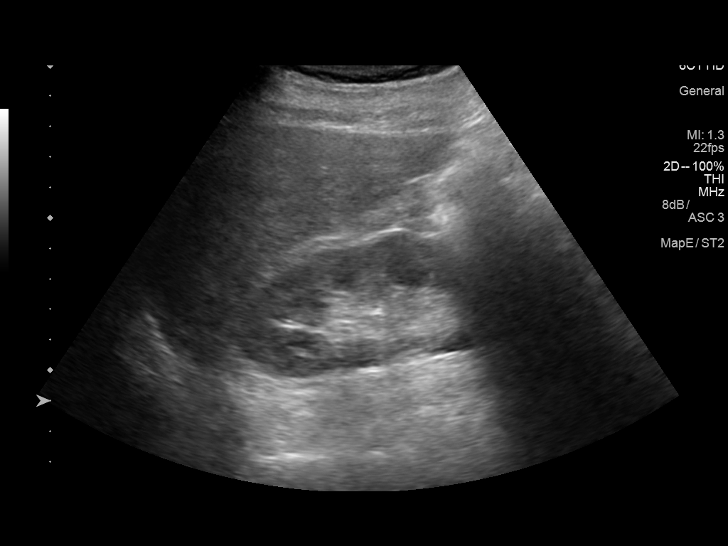
[im 20/58]
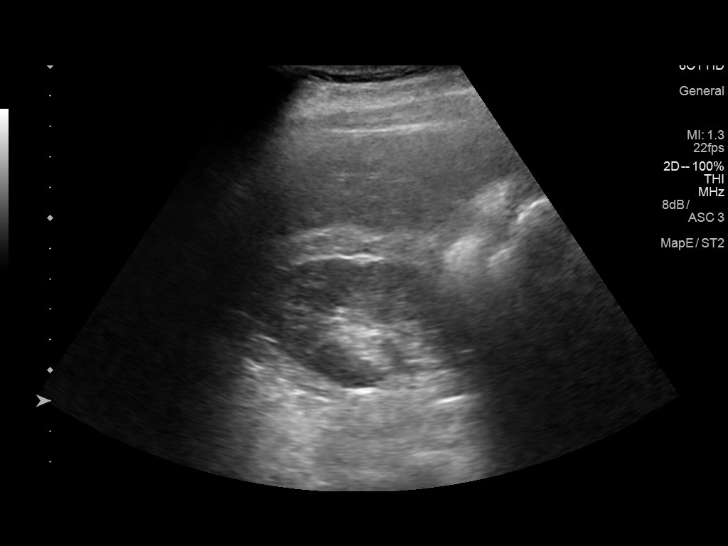
[im 22/58]
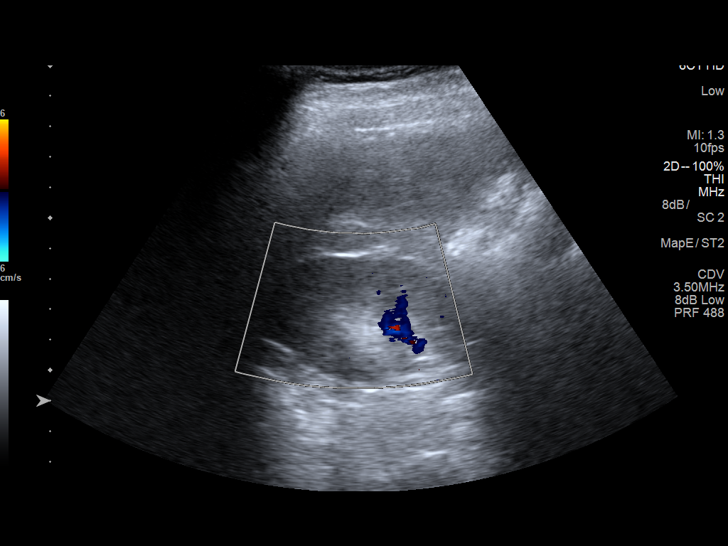
[im 27/58]
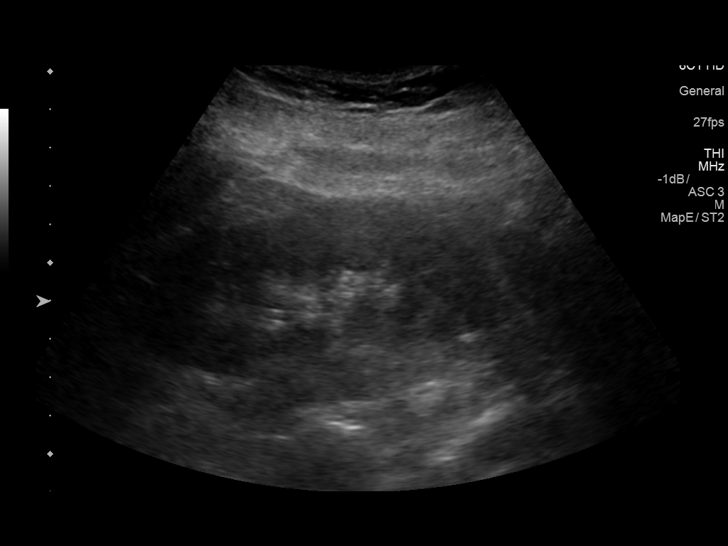
[im 31/58]
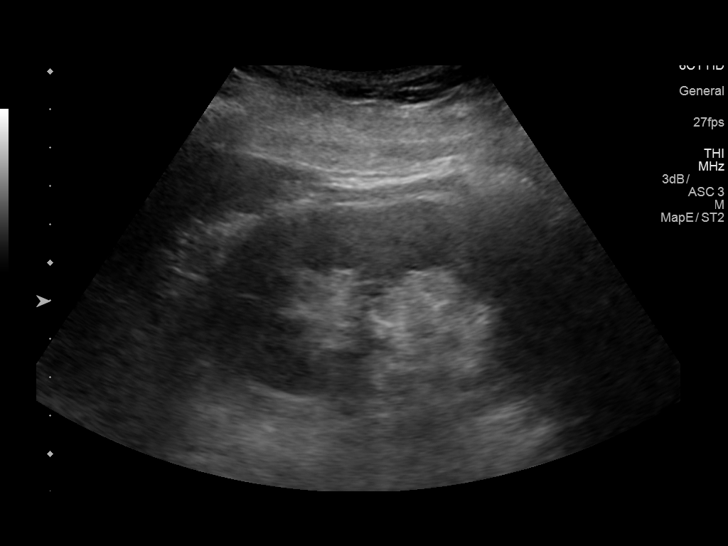
[im 36/58]
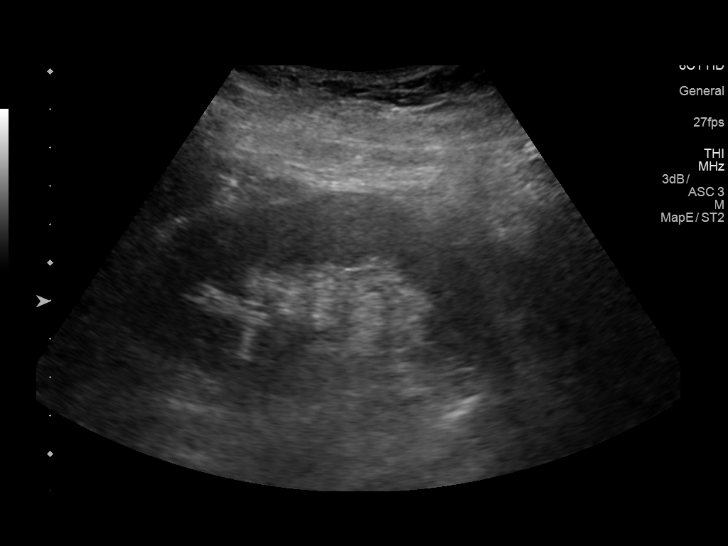
[im 39/58]
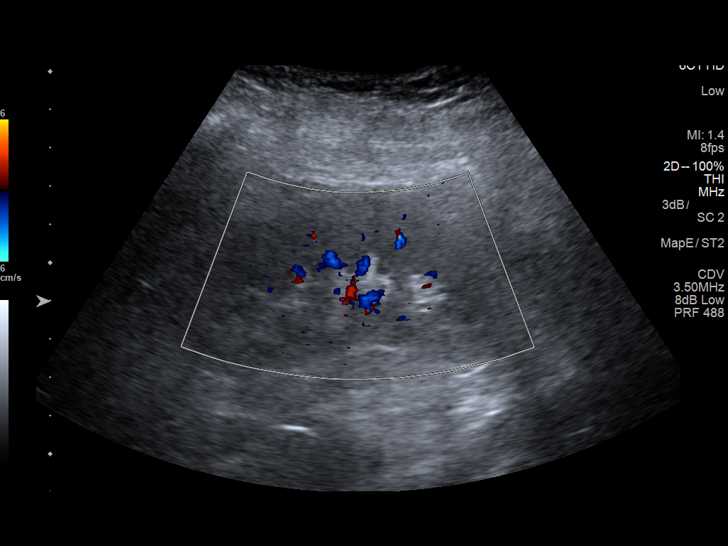
[im 43/58]
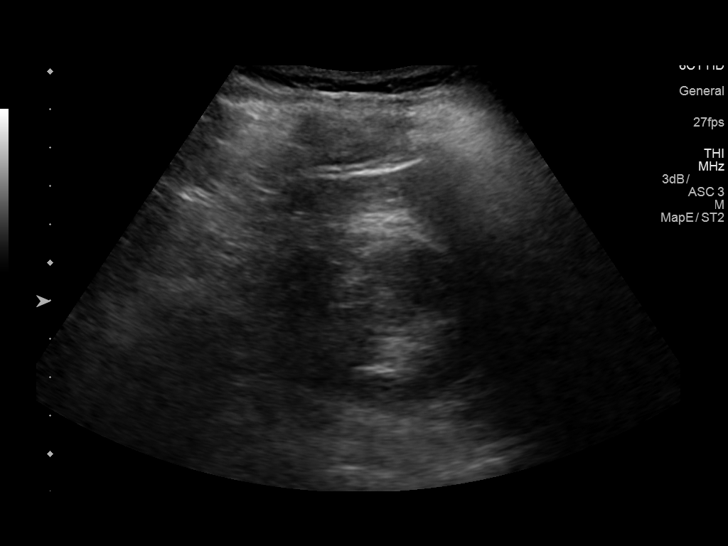
[im 48/58]
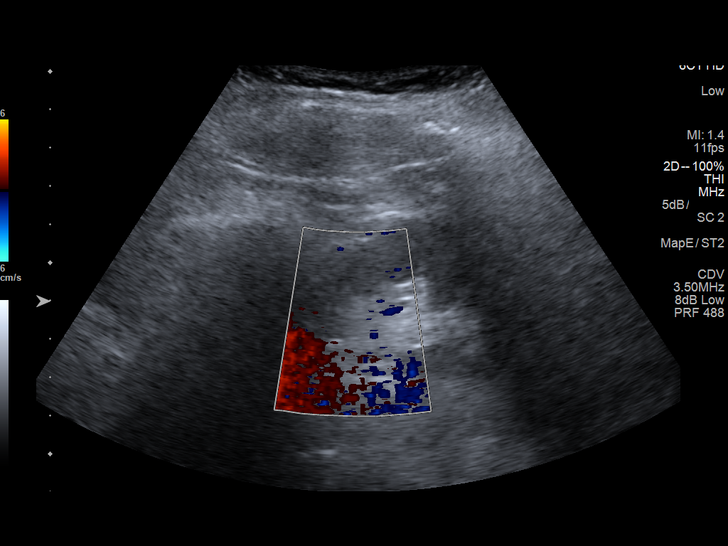
[im 53/58]
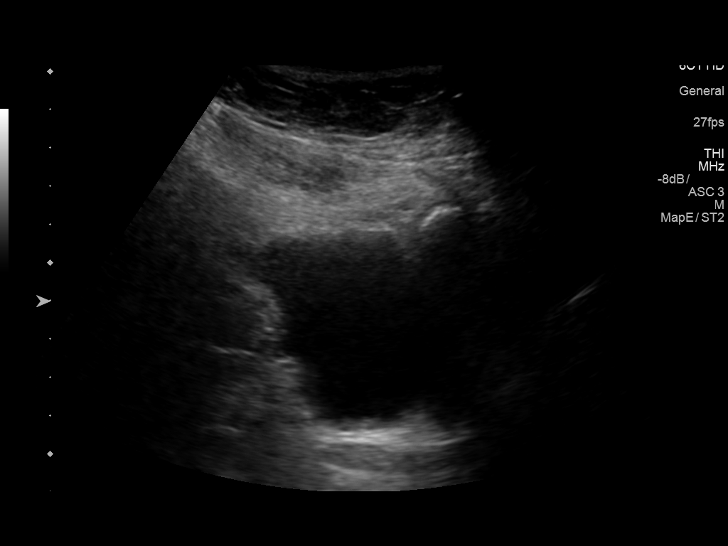
[im 58/58]
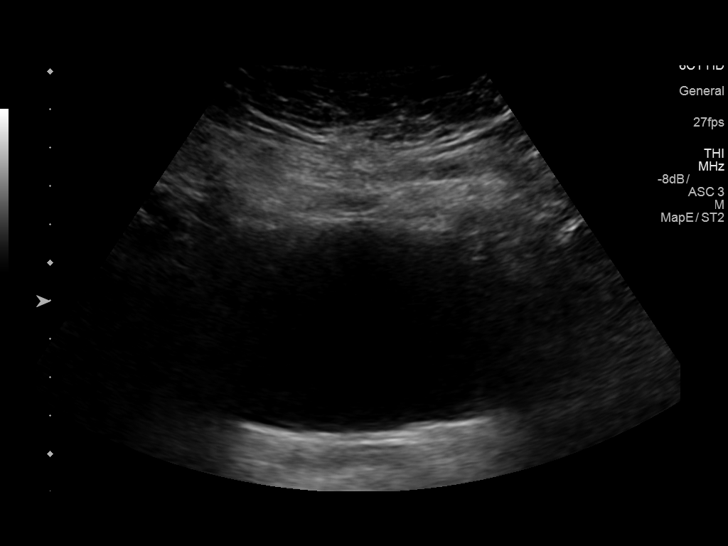

[14 of 25 positions shown; findings below may reference images not displayed]

FINDINGS: Right Kidney:

Length: 10 cm. Echogenicity within normal limits. No mass or
hydronephrosis visualized.

Left Kidney:

Length: 10 cm. Echogenicity within normal limits. No mass or
hydronephrosis visualized.

Bladder:

Appears normal for degree of bladder distention.
IMPRESSION: Normal renal ultrasound.

## 2016-09-16 IMAGING — US US ABDOMEN LIMITED
1 series · 14 of 25 positions shown · non-contrast
Comparison: CT 01/31/2014

CLINICAL DATA: Abdominal pain, worse in the right upper quadrant

EXAM:
US ABDOMEN LIMITED - RIGHT UPPER QUADRANT

[Series 1: us abdomen limited · 0.17mm/px · 14 of 45 slices shown]
[im 1/45]
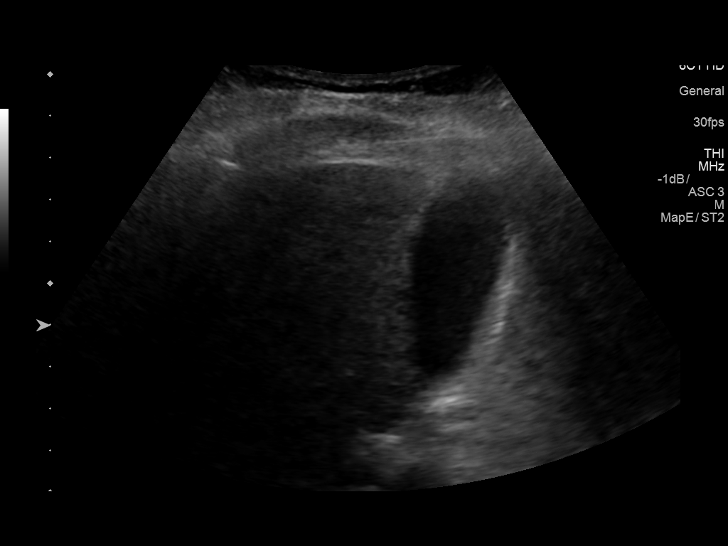
[im 4/45]
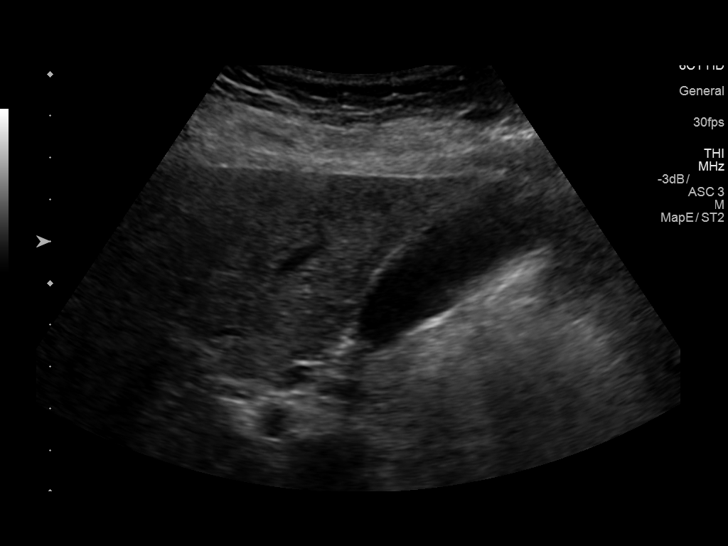
[im 8/45]
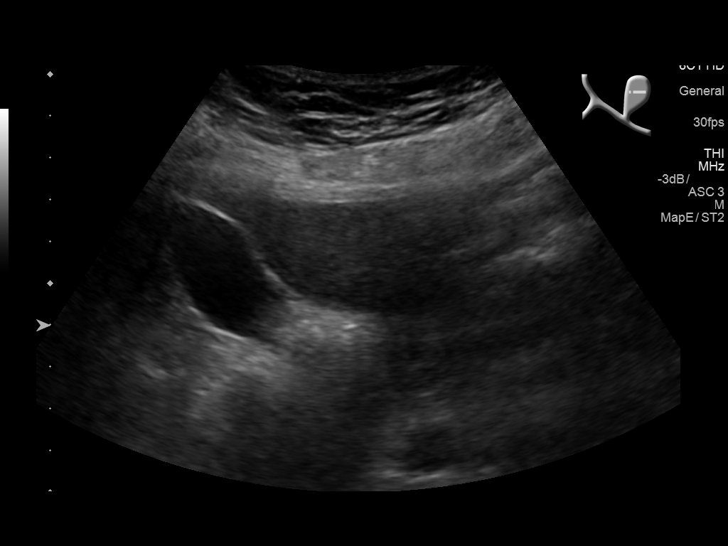
[im 12/45]
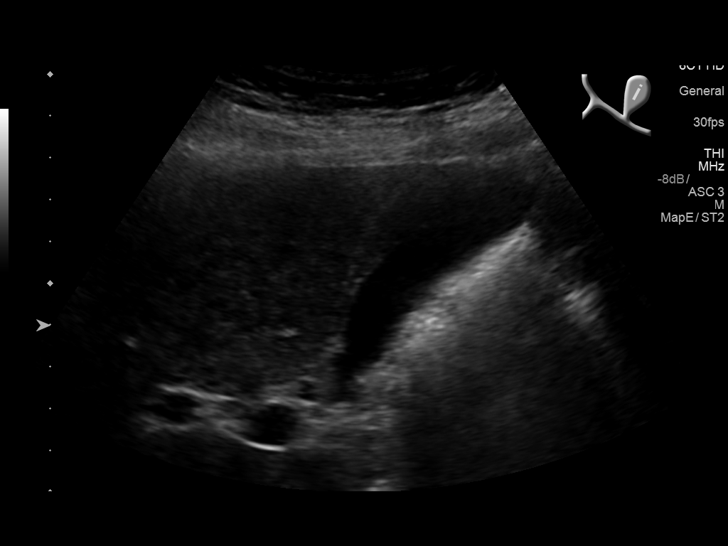
[im 15/45]
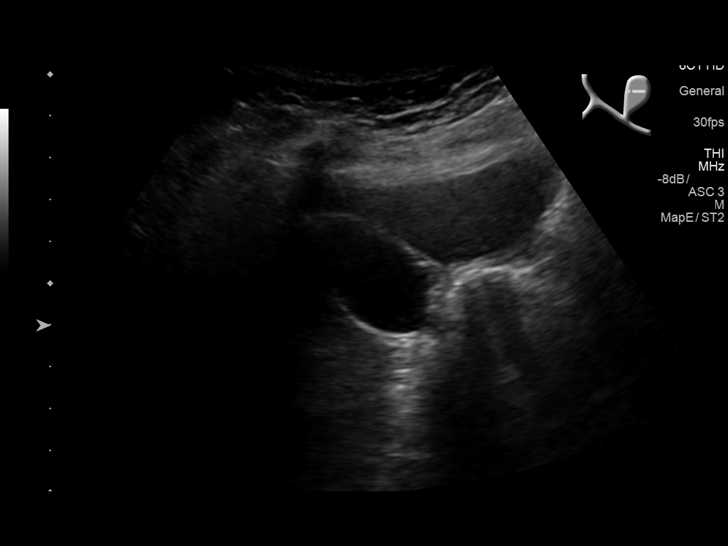
[im 17/45]
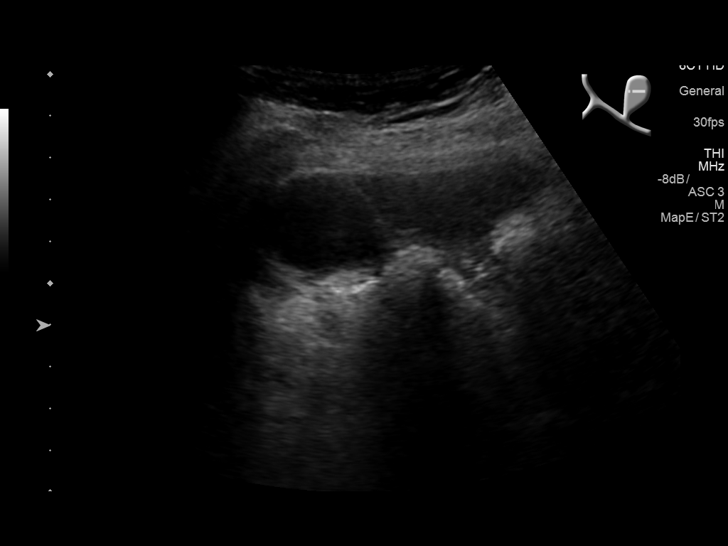
[im 21/45]
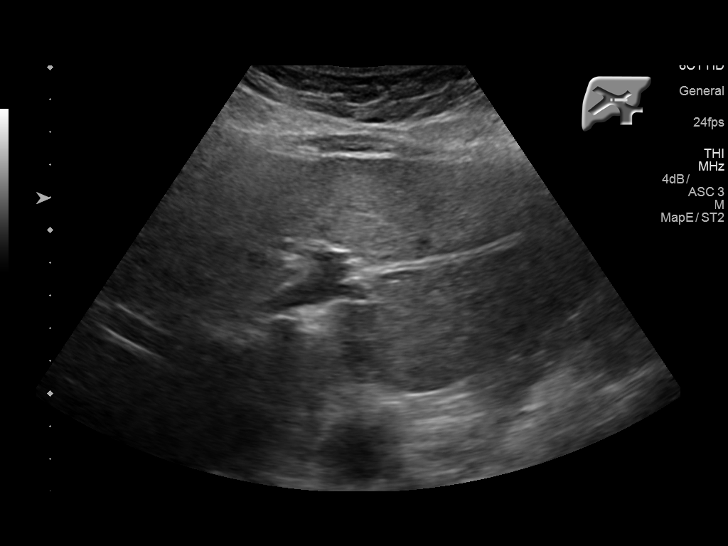
[im 24/45]
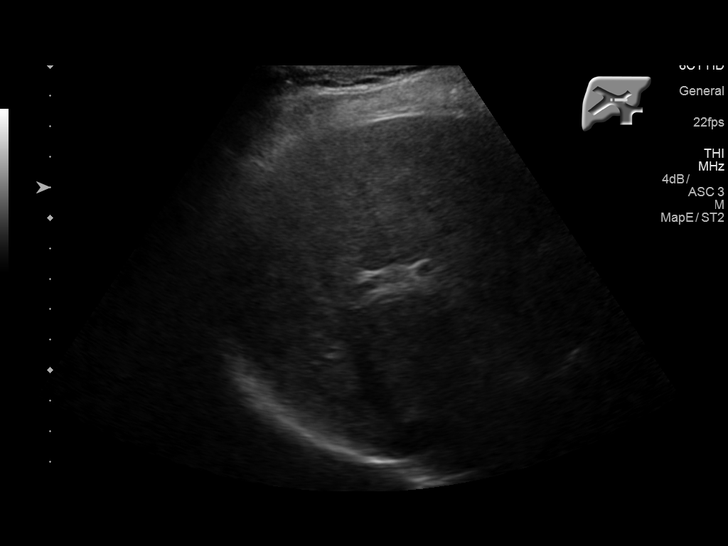
[im 28/45]
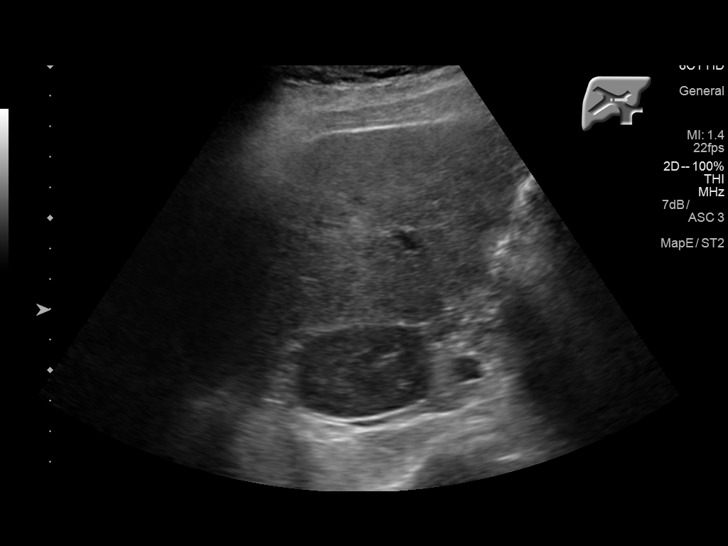
[im 30/45]
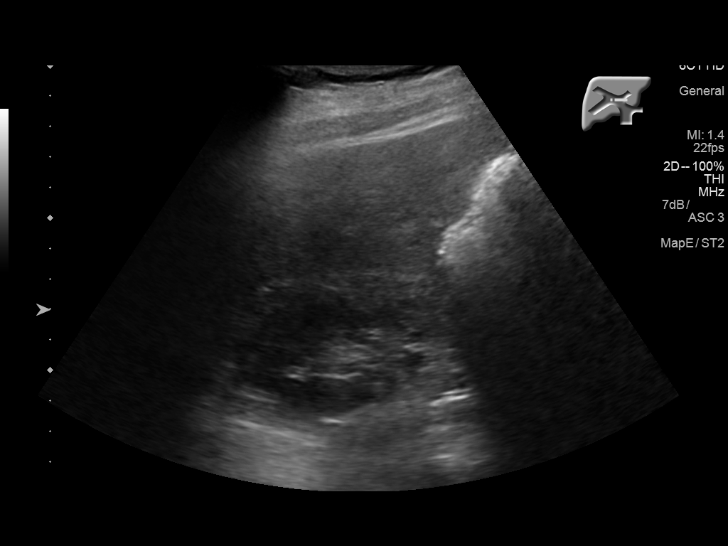
[im 34/45]
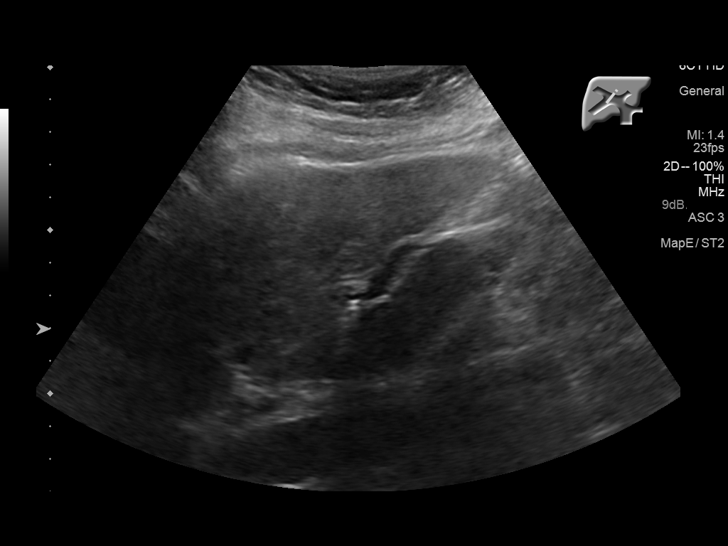
[im 37/45]
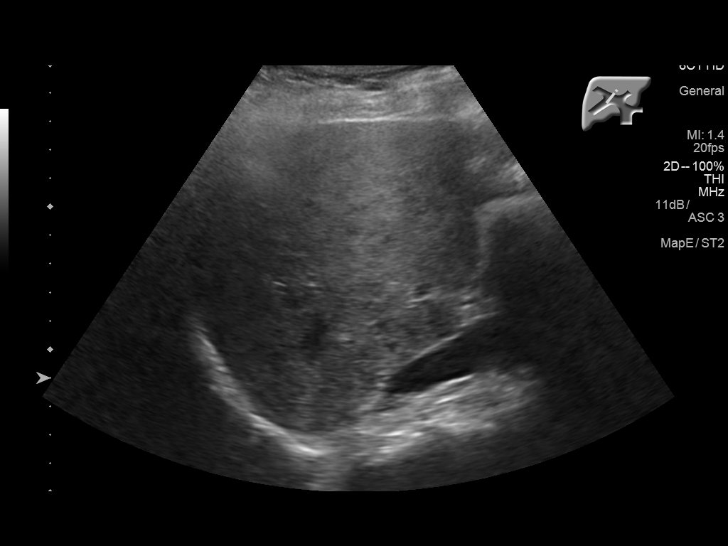
[im 41/45]
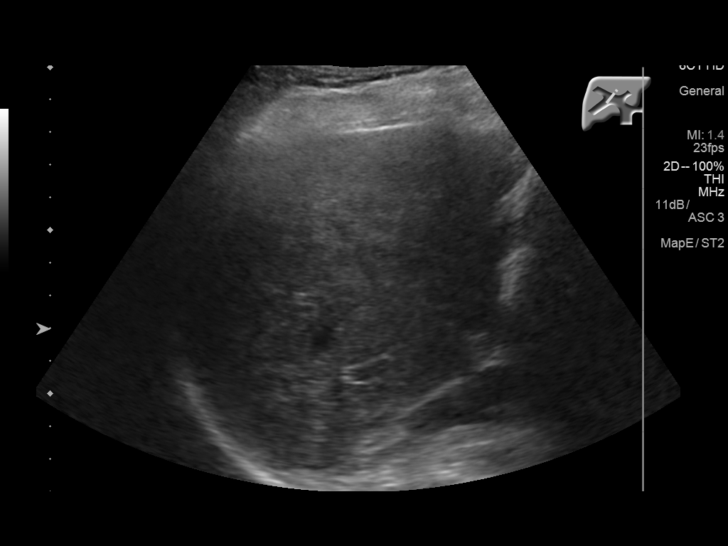
[im 45/45]
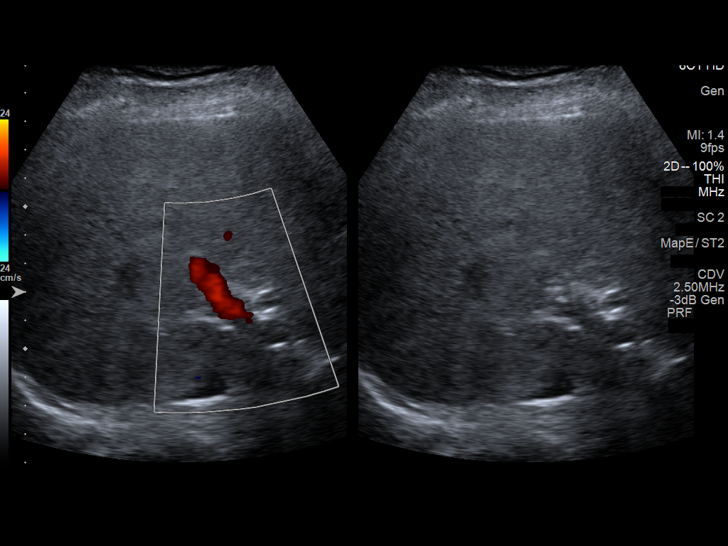

[14 of 25 positions shown; findings below may reference images not displayed]

FINDINGS: Gallbladder:

No gallstones or wall thickening visualized. No sonographic Murphy
sign noted by sonographer.

Common bile duct:

Diameter: 4 mm.  Where visualized, no filling defect.

Liver:

Heterogeneous without focal lesion or definitive steatosis.
Antegrade flow in the imaged portal venous system.
IMPRESSION: No explanation for pain.

## 2016-09-17 ENCOUNTER — Telehealth: Payer: Self-pay | Admitting: Internal Medicine

## 2016-09-17 NOTE — Telephone Encounter (Signed)
Please call and inform patient that her potassium level is normal and therefore will not need further potassium supplements. Also inform her that her liver markers (which were slightly elevated from the labs done in the ED) are now normal.

## 2016-09-17 NOTE — Telephone Encounter (Signed)
Pt contacted and informed of no longer need for potassium and update on liver enzymes given. Pt verbalized understanding.

## 2016-09-26 ENCOUNTER — Ambulatory Visit (HOSPITAL_COMMUNITY)
Admission: RE | Admit: 2016-09-26 | Discharge: 2016-09-26 | Disposition: A | Discharge status: Home / Self Care | Payer: Medicare Other | Source: Ambulatory Visit | Attending: Family Medicine | Admitting: Family Medicine

## 2016-09-26 ENCOUNTER — Encounter (HOSPITAL_COMMUNITY): Payer: Self-pay

## 2016-09-26 ENCOUNTER — Telehealth: Payer: Self-pay | Admitting: Internal Medicine

## 2016-09-26 DIAGNOSIS — M545 Low back pain, unspecified: Secondary | ICD-10-CM

## 2016-09-26 DIAGNOSIS — M48061 Spinal stenosis, lumbar region without neurogenic claudication: Secondary | ICD-10-CM | POA: Insufficient documentation

## 2016-09-26 DIAGNOSIS — M5416 Radiculopathy, lumbar region: Secondary | ICD-10-CM

## 2016-09-26 DIAGNOSIS — M5136 Other intervertebral disc degeneration, lumbar region: Secondary | ICD-10-CM | POA: Diagnosis not present

## 2016-09-26 NOTE — Telephone Encounter (Signed)
Called patient to discuss MRI results. Since symptoms have been getting worse over the past 3 months, will make an urgent referral to neurosurgery. Due to significant pain will increase Tramadol 100mg  BID PRN for back and leg pain  Attempted to call in Tramadol, but was left on call waiting for over 10 minutes. Will send to RN team to call in. Please call in Tramadol 50mg  tablet: take 100mg  (2tab) BID PRN back pain #40 no refills.

## 2016-09-26 NOTE — Addendum Note (Signed)
Addended by: Smiley Houseman on: 09/26/2016 07:00 PM   Modules accepted: Orders

## 2016-09-27 NOTE — Telephone Encounter (Signed)
Prescription has been called into pharmacy, a VM was left for pharmacist. [employer].

## 2016-09-28 ENCOUNTER — Telehealth: Payer: Self-pay | Admitting: Internal Medicine

## 2016-09-28 DIAGNOSIS — R29898 Other symptoms and signs involving the musculoskeletal system: Secondary | ICD-10-CM

## 2016-09-28 NOTE — Telephone Encounter (Signed)
Received a message from staff reporting that Ms. Dismore did not was to be referred to neurosurgery. I called her and explained again the reasoning for this. She now agrees to be referred. Will order again and forward to Ms. Hill.

## 2016-10-03 ENCOUNTER — Encounter: Payer: Self-pay | Admitting: Gastroenterology

## 2016-10-03 ENCOUNTER — Ambulatory Visit (INDEPENDENT_AMBULATORY_CARE_PROVIDER_SITE_OTHER): Payer: Medicare Other | Admitting: Gastroenterology

## 2016-10-03 ENCOUNTER — Other Ambulatory Visit: Payer: Self-pay | Admitting: Student

## 2016-10-03 VITALS — BP 148/60 | HR 80 | Ht 63.0 in | Wt 152.8 lb

## 2016-10-03 DIAGNOSIS — M5441 Lumbago with sciatica, right side: Secondary | ICD-10-CM | POA: Diagnosis not present

## 2016-10-03 DIAGNOSIS — R1084 Generalized abdominal pain: Secondary | ICD-10-CM

## 2016-10-03 DIAGNOSIS — G8929 Other chronic pain: Secondary | ICD-10-CM | POA: Diagnosis not present

## 2016-10-03 NOTE — Progress Notes (Signed)
Margaret Rangel  Chief Complaint: Abdominal pain and altered bowel habits  Subjective  History:  This is a 71 year old woman I have seen twice in the last 18 months. She has chronic abdominal pain is part of a chronic pain syndrome. I last saw her in December of last year she was having some intermittent bleeding from internal hemorrhoids. She was recently seen in family practice resident clinic and referred back to [place] for ongoing abdominal pain. As before, she complains of unrelenting back pain radiating down the right side of her leg and says this causes her knee "to give way". She feels the symptoms are worse around the time of a bowel movement. She had a recent ED visit for both back and abdominal pain, those notes and results were reviewed. CT abdomen and pelvis at that point was unrevealing for cause of abdominal pain. She has known lumbar disc disease from an MRI several years ago. Margaret Rangel is a somewhat limited historian, but seems to indicate she has seen either pain specialist and had some back injections and/or an orthopedist who suggested surgery that she does not want. ROS: Cardiovascular:  no chest pain Respiratory: no dyspnea  Multiple areas of chronic musculoskeletal pain, but primarily the lower back and radiating down the right leg in the right knee.  The patient's Past Medical, Family and Social History were reviewed and are on file in the EMR.  Objective:  Med list reviewed  Vital signs in last 24 hrs: Vitals:   10/03/16 1056  BP: (!) 148/60  Pulse: 80    Physical Exam  Antalgic gait  HEENT: sclera anicteric, oral mucosa moist without lesions  Neck: supple, no thyromegaly, JVD or lymphadenopathy  Cardiac: RRR without murmurs, S1S2 heard, no peripheral edema  Pulm: clear to auscultation bilaterally, normal RR and effort noted  Abdomen: soft, distractible RLQ tenderness, with active bowel sounds. No guarding or palpable  hepatosplenomegaly or mass.  Skin; warm and dry, no jaundice or rash  Recent Labs: CMP Latest Ref Rng & Units 09/14/2016 09/06/2016 03/29/2016  Glucose 65 - 99 mg/dL 99 97 112(H)  BUN 8 - 27 mg/dL 8 10 8   Creatinine 0.57 - 1.00 mg/dL 1.02(H) 1.19(H) 0.86  Sodium 134 - 144 mmol/L 142 142 139  Potassium 3.5 - 5.2 mmol/L 3.5 2.7(LL) 3.5  Chloride 96 - 106 mmol/L 107(H) 107 104  CO2 18 - 29 mmol/L 22 23 24   Calcium 8.7 - 10.3 mg/dL 8.9 8.9 9.6  Total Protein 6.0 - 8.5 g/dL 6.6 6.9 7.1  Total Bilirubin 0.0 - 1.2 mg/dL 0.2 0.4 0.2  Alkaline Phos 39 - 117 IU/L 98 109 76  AST 0 - 40 IU/L 19 73(H) 21  ALT 0 - 32 IU/L 21 91(H) 19   CBC    Component Value Date/Time   WBC 11.1 (H) 09/06/2016 1342   RBC 4.55 09/06/2016 1342   HGB 10.2 (L) 09/06/2016 1342   HCT 32.4 (L) 09/06/2016 1342   PLT 272 09/06/2016 1342   MCV 71.2 (L) 09/06/2016 1342   MCH 22.4 (L) 09/06/2016 1342   MCHC 31.5 09/06/2016 1342   RDW 18.5 (H) 09/06/2016 1342   LYMPHSABS 4.5 (H) 09/06/2016 1342   MONOABS 0.9 09/06/2016 1342   EOSABS 0.2 09/06/2016 1342   BASOSABS 0.0 09/06/2016 1342   Last colonoscopy was done by Dr. Deatra Ina in September 2015, multiple adenomatous polyps were removed.  Radiologic studies: CT abdomen and pelvis from 09/06/2016 was personally reviewed.   @  ASSESSMENTPLANBEGIN@ Assessment: Encounter Diagnoses  Name Primary?  . Generalized abdominal pain Yes  . Chronic right-sided low back pain with right-sided sciatica    Her long-standing abdominal pain is part of a global chronic pain syndrome and has had a extensive and unrevealing workup over the years, including this recent CT scan. I do not think her digestive tract is the primary source for the back pain radiating down the right leg in the knee pain.  I am referring her back to primary care for chronic pain management. Plan:  We will recall her later this year for a surveillance colonoscopy.   Total time 15 minutes, over half spent in  counseling and coordination of care.    Margaret Rangel  CC: Dennie Fetters, MD  family medicine resident clinic

## 2016-10-03 NOTE — Patient Instructions (Addendum)
If you are age 72 or older, your body mass index should be between 23-30. Your Body mass index is 27.07 kg/m. If this is out of the aforementioned range listed, please consider follow up with your Primary Care Provider.  If you are age 2 or younger, your body mass index should be between 19-25. Your Body mass index is 27.07 kg/m. If this is out of the aformentioned range listed, please consider follow up with your Primary Care Provider.   Follow up as needed.  Thank you for choosing [facility]  Dr Wilfrid Lund III

## 2016-10-18 ENCOUNTER — Telehealth: Payer: Self-pay | Admitting: Internal Medicine

## 2016-10-18 NOTE — Telephone Encounter (Signed)
Patient calling to request refill of:  Name of Medication(s):  Tramadol Last date of OV:  09/14/2016 Pharmacy:  Patient to pick up   Will forward to MD. Johnney Ou

## 2016-10-18 NOTE — Telephone Encounter (Signed)
Pt is calling for a refill on her Tramadol for her leg. jw

## 2016-10-19 NOTE — Telephone Encounter (Signed)
Will forward to MD to advise.  There is a 48 hour response policy. Jazmin Hartsell,CMA

## 2016-10-19 NOTE — Telephone Encounter (Signed)
Please call in Tramadol 50mg  tablets. 1 tablet BID PRN for severe pain. Dispense #20 tablets. No refills.

## 2016-10-19 NOTE — Telephone Encounter (Signed)
Patient is aware that medication was called into pharmacy. Jazmin Hartsell,CMA

## 2016-10-19 NOTE — Telephone Encounter (Signed)
Pt calling back to find out if refill has been sent. Pt states she can't go through the weekend with knee pain.  Please let pt know when this is refilled. Ottis Stain, CMA

## 2016-11-09 ENCOUNTER — Other Ambulatory Visit: Payer: Self-pay | Admitting: Cardiology

## 2016-11-09 MED ORDER — ROSUVASTATIN CALCIUM 40 MG PO TABS: 40.0000 mg | ORAL_TABLET | Freq: Every day | ORAL | 6 refills | Status: DC

## 2016-11-21 NOTE — Progress Notes (Signed)
[facility] Phone: [phone]   Date of Visit: 11/22/2016   HPI:  Visual disturbance: - Patient sees Dr. Gershon Crane in ophthalmology. She has had lens implants in the past by him -Reports that in the past 2 weeks she has been having episodes of bright lights in her peripheral vision with right greater than the left eye - She denies any pain with this. she reports of maybe having intermittent blurred vision - The bright light symptoms occur daily, about 2-3 times a day -  denies any loss of vision  - Patient reports she was told that she needed a referral from her PCP for this  Cough:  - Patient reports is a history of asthma. She is only on when necessary albuterol  - Reports of a cough for the past 2 weeks and reports that this has been worsening  - She be coughing up mucus  - She reports of nasal congestion and drainage, postnasal drip, watery eyes, sneezing as well  - She denies any shortness of breath, fevers, chills  - She has a history of reflux. She was put on Protonix by her prior PCP and has been on this for about 2-3 years. However she reports that she has not been taking this for the past 4 days or so. When she does take the Protonix that does help with her symptoms. - She does not smoke. She was a former smoker smoked 3-4 cigarettes a day for less than 10 years - No sick contacts - Asthma control test questionnaire: 2, 5, 2, 1, 2 = 12 total   Anxiety:  - Patient's prior PCP laced her on Klonopin when necessary for her anxiety  -  vision has not been on any other medications for her anxiety -  Discussed that Klonopin is not the best medication option for the long term to control anxiety symptoms. Patient is willing to start a different medication - GAD7: Score of 7  HTN: - Patient reports she takes carvedilol 6.25 mg daily instead of the prescribed twice daily. - She reports that if she takes it twice daily she has symptoms of hypotension - She  reports that when she wakes up in the morning her blood pressure is in the 150s to 258N for systolic but reports that this gets better in the afternoon to the 120s. - Denies any headaches, chest pain, soreness of breath   ROS: See HPI.  [place]:  PMH: HTN DM2 HLD Hx CVA Reflux Diverticulosis with hx of rectal bleeding Osteoporosis Right Hip OA Hx of Colon Polyp Hx esophageal stricture Hx of Anal Fissue  PHYSICAL EXAM: BP (!) 154/60   Pulse 75   Temp 98.6 F (37 C) (Oral)   Ht 5\' 3"  (1.6 m)   Wt 148 lb 9.6 oz (67.4 kg)   SpO2 97%   BMI 26.32 kg/m  GEN: NAD HEENT: Atraumatic, normocephalic, neck supple, EOMI, sclera clear, pharynx mildly erythematous otherwise unremarkable and without exudates. No sinus tenderness to palpation. No cervical lymphadenopathy. Nasal turbinates are small mildly enlarged bilaterally. Funduscopic exam of the eyes are grossly unremarkable from the limited exam that I did. Visual acuity: 20/20 CV: RRR, no murmurs, rubs, or gallops PULM: CTAB, normal effort SKIN: No rash or cyanosis; warm and well-perfused EXTR: No lower extremity edema or calf tenderness PSYCH: Mood and affect euthymic, normal rate and volume of speech NEURO: Awake, alert, no focal deficits, normal speech   ASSESSMENT/PLAN:  Health Maintenance:   -  asked patient to start ASA 81mg  daily for CV protection    Essential hypertension Blood pressure elevated today even with repeat measurements. Possible whitecoat hypertension as patient states that her blood pressures later on in the day are normal at home. But she does report that her blood pressure in the morning is elevated in the 150s to 088P for systolic. Since she mentioned that she has symptoms of hypotension with carvedilol 6.25 mg twice a day we'll decrease to carvedilol 3.125 twice a day. Would benefit from ambulatory blood pressure monitoring. Advised patient to make this appointment with Dr. Valentina Lucks at checkout. If BP is  elevated with monitoring would choose ARB for renal protection in the setting of DM2  GAD (generalized anxiety disorder) Discussed with patient Klonopin is not a chronic medication to control her symptoms. Patient agrees to start on a different medication. Start BuSpar 5 mg twice a day. Provided #15 tablets of Klonopin until these per starts to work. Follow-up in 4 weeks  Seasonal allergies I think her cough is multifactorial. I think she has untreated symptoms of seasonal allergies which is causing some postnasal drip contributing to her cough. She also has GERD and does not use her medications as prescribed. She also has a history of asthma and is only on albuterol. According to the questionnaire her asthma is not controlled. She denies ever having PFTs done at her prior PCPs office. Would benefit from PFTs. Will treat for allergies with Allegra to see if this helps control her symptoms. If not she might benefit from a controller medication. Asthma.  Asthma, chronic I think her cough is multifactorial. I think she has untreated symptoms of seasonal allergies which is causing some postnasal drip contributing to her cough. She also has GERD and does not use her medications as prescribed. She also has a history of asthma and is only on albuterol. According to the questionnaire her asthma is not controlled. She denies ever having PFTs done at her prior PCPs office. Would benefit from PFTs; hopefully Dr. Valentina Lucks can do this while she is here for her ambulatory blood pressure appointment. Will treat for allergies with Allegra to see if this helps control her symptoms. If not she might benefit from a controller medication. Asthma.   Smiley Houseman, MD PGY [place]

## 2016-11-22 ENCOUNTER — Ambulatory Visit (INDEPENDENT_AMBULATORY_CARE_PROVIDER_SITE_OTHER): Payer: Medicare Other | Admitting: Internal Medicine

## 2016-11-22 ENCOUNTER — Encounter: Payer: Self-pay | Admitting: Internal Medicine

## 2016-11-22 VITALS — BP 154/60 | HR 75 | Temp 98.6°F | Ht 63.0 in | Wt 148.6 lb

## 2016-11-22 DIAGNOSIS — F411 Generalized anxiety disorder: Secondary | ICD-10-CM | POA: Insufficient documentation

## 2016-11-22 DIAGNOSIS — J302 Other seasonal allergic rhinitis: Secondary | ICD-10-CM | POA: Insufficient documentation

## 2016-11-22 DIAGNOSIS — H539 Unspecified visual disturbance: Secondary | ICD-10-CM | POA: Diagnosis not present

## 2016-11-22 DIAGNOSIS — I10 Essential (primary) hypertension: Secondary | ICD-10-CM

## 2016-11-22 DIAGNOSIS — J45909 Unspecified asthma, uncomplicated: Secondary | ICD-10-CM | POA: Diagnosis not present

## 2016-11-22 MED ORDER — CARVEDILOL 3.125 MG PO TABS: 3.1250 mg | ORAL_TABLET | Freq: Two times a day (BID) | ORAL | 0 refills | Status: DC

## 2016-11-22 MED ORDER — GLUCOSE BLOOD VI STRP: ORAL_STRIP | 12 refills | Status: DC

## 2016-11-22 MED ORDER — ACCU-CHEK AVIVA PLUS W/DEVICE KIT: PACK | 0 refills | Status: DC

## 2016-11-22 MED ORDER — CLONAZEPAM 0.5 MG PO TABS: 0.5000 mg | ORAL_TABLET | Freq: Every day | ORAL | 0 refills | Status: DC | PRN

## 2016-11-22 MED ORDER — BUSPIRONE HCL 5 MG PO TABS: 5.0000 mg | ORAL_TABLET | Freq: Two times a day (BID) | ORAL | 0 refills | Status: DC

## 2016-11-22 MED ORDER — TRAMADOL HCL 50 MG PO TABS: 50.0000 mg | ORAL_TABLET | Freq: Two times a day (BID) | ORAL | 0 refills | Status: DC | PRN

## 2016-11-22 MED ORDER — ASPIRIN EC 81 MG PO TBEC: 81.0000 mg | DELAYED_RELEASE_TABLET | Freq: Every day | ORAL | 0 refills | Status: DC

## 2016-11-22 MED ORDER — FEXOFENADINE HCL 60 MG PO TABS: 60.0000 mg | ORAL_TABLET | Freq: Two times a day (BID) | ORAL | 0 refills | Status: DC

## 2016-11-22 NOTE — Assessment & Plan Note (Addendum)
I think her cough is multifactorial. I think she has untreated symptoms of seasonal allergies which is causing some postnasal drip contributing to her cough. She also has GERD and does not use her medications as prescribed. She also has a history of asthma and is only on albuterol. According to the questionnaire her asthma is not controlled. She denies ever having PFTs done at her prior PCPs office. Would benefit from PFTs; hopefully Dr. Valentina Lucks can do this while she is here for her ambulatory blood pressure appointment. Will treat for allergies with Allegra to see if this helps control her symptoms. If not she might benefit from a controller medication. Asthma.

## 2016-11-22 NOTE — Patient Instructions (Addendum)
We will start Buspar 5 mg twice a day for your anxiety. The goal is to come off of your Klonopin.  I made a referral to the eye doctor.  Take carvedilol 3.125mg  twice a day  Make an appointment with Dr. Valentina Lucks for blood pressure monitoring  Follow up with me in about 4 weeks (after you see Dr. Valentina Lucks)

## 2016-11-22 NOTE — Assessment & Plan Note (Signed)
Discussed with patient Klonopin is not a chronic medication to control her symptoms. Patient agrees to start on a different medication. Start BuSpar 5 mg twice a day. Provided #15 tablets of Klonopin until these per starts to work. Follow-up in 4 weeks

## 2016-11-22 NOTE — Assessment & Plan Note (Signed)
I think her cough is multifactorial. I think she has untreated symptoms of seasonal allergies which is causing some postnasal drip contributing to her cough. She also has GERD and does not use her medications as prescribed. She also has a history of asthma and is only on albuterol. According to the questionnaire her asthma is not controlled. She denies ever having PFTs done at her prior PCPs office. Would benefit from PFTs. Will treat for allergies with Allegra to see if this helps control her symptoms. If not she might benefit from a controller medication. Asthma.

## 2016-11-22 NOTE — Assessment & Plan Note (Addendum)
Blood pressure elevated today even with repeat measurements. Possible whitecoat hypertension as patient states that her blood pressures later on in the day are normal at home. But she does report that her blood pressure in the morning is elevated in the 150s to 616W for systolic. Since she mentioned that she has symptoms of hypotension with carvedilol 6.25 mg twice a day we'll decrease to carvedilol 3.125 twice a day. Would benefit from ambulatory blood pressure monitoring. Advised patient to make this appointment with Dr. Valentina Lucks at checkout. If BP is elevated with monitoring would choose ARB for renal protection in the setting of DM2

## 2016-11-29 DIAGNOSIS — M5441 Lumbago with sciatica, right side: Secondary | ICD-10-CM | POA: Diagnosis not present

## 2016-12-03 DIAGNOSIS — H538 Other visual disturbances: Secondary | ICD-10-CM | POA: Diagnosis not present

## 2016-12-06 ENCOUNTER — Ambulatory Visit: Payer: Medicare Other | Admitting: Pharmacist

## 2016-12-11 ENCOUNTER — Other Ambulatory Visit: Payer: Self-pay | Admitting: *Deleted

## 2016-12-11 MED ORDER — GLIMEPIRIDE 2 MG PO TABS: ORAL_TABLET | ORAL | 1 refills | Status: DC

## 2016-12-13 ENCOUNTER — Other Ambulatory Visit: Payer: Self-pay | Admitting: *Deleted

## 2016-12-13 ENCOUNTER — Ambulatory Visit (INDEPENDENT_AMBULATORY_CARE_PROVIDER_SITE_OTHER): Payer: Medicare Other | Admitting: Pharmacist

## 2016-12-13 ENCOUNTER — Encounter: Payer: Self-pay | Admitting: Pharmacist

## 2016-12-13 DIAGNOSIS — I10 Essential (primary) hypertension: Secondary | ICD-10-CM | POA: Diagnosis not present

## 2016-12-13 NOTE — Patient Instructions (Addendum)
Wearing the Blood Pressure Monitor  The cuff will inflate every 20 minutes during the day and every 30 minutes while you sleep.  Your blood pressure readings will NOT display after cuff inflation  Fill out the blood pressure-activity diary during the day, especially during activities that may affect your reading -- such as exercise, stress, walking, taking your blood pressure medications  Important things to know:  Avoid taking the monitor off for the next 24 hours, unless it causes you discomfort or pain.  Do NOT get the monitor wet and do NOT dry to clean the monitor with any cleaning products.  Do NOT drop the monitor.  Do NOT put the monitor on anyone else's arm.  When the cuff inflates, avoid excess movement. Let the cuffed arm hang loosely, slightly away from the body. Avoid flexing the muscles or moving the hand/fingers.  When you go to sleep, make sure that the hose is not kinked.  Remember to fill out the blood pressure activity diary.  If you experience severe pain or unusual pain (not associated with getting your blood pressure checked), remove the monitor.  Troubleshooting:  Code  Troubleshooting   1  Check cuff position, tighten cuff   2, 3  Remain still during reading   4, 87  Check air hose connections and make sure cuff is tight   85, 89  Check hose connections and make tubing is not crimped   86  Push START/STOP to restart reading   88, 91  Retry by pushing START/STOP   90  Replace batteries. If problem persists, remove monitor and bring back to   clinic at follow up   97, 98, 99  Service required - Remove monitor and bring back to clinic at follow up    Blood Pressure Activity Diary  Time Lying down/ Sleeping Walking/ Exercise Stressed/ Angry Headache/ Pain Dizzy  9 AM       10 AM       11 AM       12 PM       1 PM       2 PM       Time Lying down/ Sleeping Walking/ Exercise Stressed/ Angry Headache/ Pain Dizzy  3 PM       4 PM        5 PM       6  PM       7 PM       8 PM       Time Lying down/ Sleeping Walking/ Exercise Stressed/ Angry Headache/ Pain Dizzy  9 PM       10 PM       11 PM       12 AM       1 AM       2 AM       3 AM       Time Lying down/ Sleeping Walking/ Exercise Stressed/ Angry Headache/ Pain Dizzy  4 AM       5 AM       6 AM       7 AM       8 AM       9 AM       10 AM        Time you woke up: _________                  Time you went to sleep:__________  Come back tomorrow at 11:00 to have the monitor removed  Call the [facility] if you have any questions before then [phone])   Following evaluation - please take carvedilol twice daily.  No new medications at this time.

## 2016-12-13 NOTE — Progress Notes (Signed)
   S:    Patient arrives in fair spirits ambulating without assistance. Presents to the clinic for ambulatory blood pressure evaluation.  Patient was referred on 11/22/16 by Dr. Dallas Schimke. Patient was last seen by Primary Care Provider on 11/22/16.  Diagnosed with Hypertension in the year of ~2013.    Medication compliance is reported to be nonadherent. Patient reports taking carvedilol once daily instead of twice daily due to feeling dizzy when getting out of bed.   Discussed procedure for wearing the monitor and gave patient written instructions. Monitor was placed on non-dominant arm with instructions to return in the morning.   Current BP Medications include:  Carvedilol 3.125 mg BID; patient reports taking only once daily because she feels it drops her blood pressure too much.  Patient returned to the clinic and reported that she "did not sleep" while wearing the monitor and was very frustrated with it. She reported removing the cuff a few times because it hurt her arm, but per blood pressure report only ~10 readings were missed. She documented 3 episodes of dizziness on the log sheet.   O:  Last 3 Office BP readings: BP Readings from Last 3 Encounters:  12/13/16 (!) 146/72  11/22/16 (!) 154/60  10/03/16 (!) 148/60     Today's Office BP reading: 146/72 mmHg (manual reading)  ABPM Study Data: Arm Placement left arm   Overall Mean 24hr BP:   162/62 mmHg HR: 78   Daytime Mean BP:  163 mmHg HR: 79   Nighttime Mean BP:  162 mmHg HR: 58   Dipping Pattern: No.  Sys:   0.6%   Dia: 10.2%   [normal dipping ~10-20%]   Non-hypertensive ABPM thresholds: daytime BP <135/85 mmHg, sleeptime BP <120/70 mmHg NICE Hypertension Guidelines ([place]) using ABPM: Stage I: >135/85 mmHg, Stage 2: >150/95 mmHg)   BMET    Component Value Date/Time   NA 142 09/14/2016 1126   K 3.5 09/14/2016 1126   CL 107 (H) 09/14/2016 1126   CO2 22 09/14/2016 1126   GLUCOSE 99 09/14/2016 1126   GLUCOSE 97  09/06/2016 1342   BUN 8 09/14/2016 1126   CREATININE 1.02 (H) 09/14/2016 1126   CREATININE 0.86 03/29/2016 1509   CALCIUM 8.9 09/14/2016 1126   GFRNONAA 55 (L) 09/14/2016 1126   GFRAA 64 09/14/2016 1126    A/P History of hypertension longstanding found to have isolated systolic hypertension given 24-hour ambulatory blood pressure demonstrates consistent elevations with an average blood pressure of 162/63 mmHg, and a lack of nocturnal dipping pattern. However she reported difficulty sleeping while wearing the monitor so the observed pattern may not be consistent with normal sleep habits. No medication changes today. Patient was advised to take her carvedilol twice daily to facilitate nocturnal dipping. She was counseled to get out of bed slowly to avoid prevent orthostasis.   New Rx for Tramadol provided by PCP - Dr. Dallas Schimke.   Asked to return for PFT in early September at the request of the PCP.   Results reviewed and written information provided.  Total time in face-to-face counseling 30 minutes.   F/U Clinic Visit with Dr. Dallas Schimke.  Patient seen with Cleotis Lema, PharmD Candidate, and Waverly Ferrari.

## 2016-12-14 MED ORDER — TRAMADOL HCL 50 MG PO TABS: 50.0000 mg | ORAL_TABLET | Freq: Two times a day (BID) | ORAL | 0 refills | Status: DC | PRN

## 2016-12-14 NOTE — Progress Notes (Signed)
Patient ID: Margaret Rangel, female   DOB: 01/21/1946, 71 y.o.   MRN: [id number] Reviewed: Agree with Dr. Graylin Shiver documentation and management.

## 2016-12-14 NOTE — Assessment & Plan Note (Signed)
History of hypertension longstanding found to have isolated systolic hypertension given 24-hour ambulatory blood pressure demonstrates consistent elevations with an average blood pressure of 162/63 mmHg, and a lack of nocturnal dipping pattern. However she reported difficulty sleeping while wearing the monitor so the observed pattern may not be consistent with normal sleep habits. No medication changes today. Patient was advised to take her carvedilol twice daily to facilitate nocturnal dipping. She was counseled to get out of bed slowly to avoid prevent orthostasis.

## 2016-12-26 ENCOUNTER — Telehealth: Payer: Self-pay | Admitting: *Deleted

## 2016-12-26 ENCOUNTER — Ambulatory Visit: Payer: Medicare Other | Admitting: *Deleted

## 2016-12-26 ENCOUNTER — Other Ambulatory Visit: Payer: Self-pay | Admitting: Internal Medicine

## 2016-12-26 MED ORDER — GLUCOSE BLOOD VI STRP: ORAL_STRIP | 12 refills | Status: DC

## 2016-12-26 MED ORDER — ONETOUCH VERIO VI SOLN: 1.0000 | 6 refills | Status: AC

## 2016-12-26 MED ORDER — ONETOUCH VERIO HIGH VI SOLN: 1.0000 | 6 refills | Status: AC

## 2016-12-26 MED ORDER — ONETOUCH DELICA LANCETS 33G MISC: 1.0000 | Freq: Three times a day (TID) | 12 refills | Status: DC

## 2016-12-26 MED ORDER — ONETOUCH DELICA LANCING DEV MISC: 1.0000 | 0 refills | Status: AC

## 2016-12-26 MED ORDER — ONETOUCH VERIO W/DEVICE KIT: 1.0000 | PACK | Freq: Three times a day (TID) | 0 refills | Status: AC

## 2016-12-26 NOTE — Telephone Encounter (Signed)
Prior Authorization received from [employer] for [place] [employer] test strips. Formulary preferred are [employer] products.  Diabetic supplies changed to [employer] supplies per insurance and medicare.  Derl Barrow, RN

## 2017-01-03 ENCOUNTER — Ambulatory Visit: Payer: Medicare Other | Admitting: Pharmacist

## 2017-01-10 ENCOUNTER — Other Ambulatory Visit: Payer: Self-pay | Admitting: *Deleted

## 2017-01-10 NOTE — Telephone Encounter (Signed)
Patient called in very teary. States she's in excruciating pain 2/2 arthritis right knee and back. Rates pain 9/10 at present. Offered patient same day appt. but states she's in too much pain to come to office. Requesting refill on hydrocodone which works better for her than tramadol. Discussed other measures to help pain such as applying ice and elevating right leg. Patient states ice worsens pain but she is using heat or menthol gel to affected areas. Hubbard Hartshorn, RN, BSN

## 2017-01-11 NOTE — Telephone Encounter (Signed)
Patient called very tearful in pain requesting refill on pain medication. Advised patient is she is so much pain she should be seen in clinic. Patient stated she in to much pain to come into clinic and does not have transportation. Advised patient to call EMS to transport her to ED if the pain is that severe.  Derl Barrow, RN

## 2017-01-18 ENCOUNTER — Encounter: Payer: Self-pay | Admitting: Gastroenterology

## 2017-01-29 ENCOUNTER — Ambulatory Visit: Payer: Medicare Other

## 2017-02-05 NOTE — Progress Notes (Deleted)
   [facility] Phone: [phone]   Date of Visit: 02/06/2017   HPI:  Patient is here for follow up. She was last seen on 11/22/16  Anxiety: - patient was started on Buspar 5mg  BID  HTN:  Cough:    ROS: See HPI.  [place]:  PMH: HTN DM2 HLD Hx CVA Reflux Diverticulosis with hx of rectal bleeding Osteoporosis Right Hip OA Hx of Colon Polyp Hx esophageal stricture Hx of Anal Fissure  PHYSICAL EXAM: There were no vitals taken for this visit. Gen: *** HEENT: *** Heart: *** Lungs: *** Neuro: *** Ext: ***  ASSESSMENT/PLAN:  Health maintenance:  -***  No problem-specific Assessment & Plan notes found for this encounter.  FOLLOW UP: Follow up in *** for ***  Smiley Houseman, MD PGY [place]

## 2017-02-06 ENCOUNTER — Ambulatory Visit: Payer: Medicare Other | Admitting: Internal Medicine

## 2017-02-12 NOTE — Progress Notes (Signed)
[facility] Phone: [phone]   Date of Visit: 02/14/2017   HPI:  Chronic Pain:  -His chronic pain that initially began in May.  It was no injury or trauma. -She describes this pain as a sharp burning pain that is constant which is located in her low right lower back that radiates down to her legs. -She had an MRI lumbar spine in June 2018.  This showed progressive disc degeneration and L2-3 with moderate to severe spinal stenosis and moderate left foraminal narrowing.  She also has moderate to severe spinal stenosis in L3-4.  Severe left foraminal encroachment and moderate right foraminal encroachment.  Mild spinal stenosis L4-5 with severe right foraminal encroachment and compression of the right L4 nerve root, with progression since the prior MRI. -At the clinic visit in June 2018 after seeing the MRI results we made a urgent referral to neurosurgery due to her complaint of progressive numbness and mild weakness on the right but patient refused to go at that time.  After speaking with her she then agreed again to go to the neurosurgery.  Referral was placed again on 09/28/2016.  Per chart review of visit was approved but patient never went.  Reports that she is tired of going to different doctors.  She was quite frustrated during this visit.  But I tried to explain to her that the next step for her due to her findings of her MRI lumbar spine is a referral to neurosurgery.  I also mentioned that because of the nerve compression she can have progressive weakness that might not be reversible if something is not done.  She understood -She has been using ibuprofen which has upset her stomach therefore she stopped  ROS: See HPI.  [place]:  PMH: HTN DM2 HLD Hx CVA Reflux Diverticulosis with hx of rectal bleeding Osteoporosis Right Hip OA Hx of Colon Polyp Hx esophageal stricture Hx of Anal Fissue  PHYSICAL EXAM: Temp 97.6 F (36.4 C) (Oral)   Ht 5\' 3"  (1.6 m)    Wt 146 lb 6.4 oz (66.4 kg)   SpO2 98%   BMI 25.93 kg/m .  Blood pressure was not recorded by nurse GEN: In some discomfort due to her back pain, seems to be frustrated and intermittently cries. CV: RRR, no murmurs, rubs, or gallops PULM: CTAB, normal effort MSK: Pain on palpation of the right lower paraspinal muscles in the lumbar region as well as some at midline.  She has to stand up intermittently due to her back pain.  Pai at the right hip with passive range of motion.  4-5 out of 5 strength in the right lower extremity, 5 out of 5 strength in the left lower extremity.  Reports of decreased sensation of the right lower extremity compared to the left.  Normal DP and posterior tibial pulses. 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  I have discussed with patient multiple times in the past regarding the need to refer to neurosurgery.  She agreed to initially then she would not go.  We discussed this again and she reports that she understands and will make an appointment with neurosurgery.  The most recent referral is still active therefore I gave her the contact information for [facility] so that she can  call and make an appointment as soon as possible.  For her pain we will try a short course of a steroid taper starting with prednisone 50 mg on day 1, 40 mg on day 2, 30 mg on day 3, 20 mg on day 4, 10 mg on day 5.  Tramadol as needed ordered as well.  Recommended avoiding ibuprofen.  Asked patient to follow-up in 2-3 weeks.   Smiley Houseman, MD PGY [place]

## 2017-02-14 ENCOUNTER — Ambulatory Visit (INDEPENDENT_AMBULATORY_CARE_PROVIDER_SITE_OTHER): Payer: Medicare Other | Admitting: Internal Medicine

## 2017-02-14 VITALS — Temp 97.6°F | Ht 63.0 in | Wt 146.4 lb

## 2017-02-14 DIAGNOSIS — M5441 Lumbago with sciatica, right side: Secondary | ICD-10-CM

## 2017-02-14 DIAGNOSIS — G8929 Other chronic pain: Secondary | ICD-10-CM | POA: Diagnosis not present

## 2017-02-14 DIAGNOSIS — M25551 Pain in right hip: Secondary | ICD-10-CM

## 2017-02-14 MED ORDER — ASPIRIN 81 MG PO TBEC: 81.0000 mg | DELAYED_RELEASE_TABLET | Freq: Every day | ORAL | 0 refills | Status: DC

## 2017-02-14 MED ORDER — TRAMADOL HCL 50 MG PO TABS: 50.0000 mg | ORAL_TABLET | Freq: Two times a day (BID) | ORAL | 0 refills | Status: DC | PRN

## 2017-02-14 MED ORDER — PANTOPRAZOLE SODIUM 40 MG PO TBEC: 40.0000 mg | DELAYED_RELEASE_TABLET | Freq: Every day | ORAL | 1 refills | Status: DC

## 2017-02-14 MED ORDER — PREDNISONE 10 MG PO TABS: ORAL_TABLET | ORAL | 0 refills | Status: DC

## 2017-02-14 NOTE — Patient Instructions (Signed)
Back pain can be very frustrating to have.  I am glad we were able to talk a little bit more about this today.  It is very important that you follow-up at the neurosurgeons office, [facility] [phone].  [place] is good until March.  To give them a call and make an appointment as soon as you can.  If they need another referral you can always call our clinic and let us know. For your pain we decided to do a short course of steroids.  I have prescribed prednisone 10 mg tablets.  On the first day please take 50 mg (5 tablets) that morning, then the next morning please take 40 mg (4 tablets), then the next morning please take 30 mg (3 tablets), then the next morning please take 20 mg (2 tablets), and the next morning please take 10 mg.  This should help with inflammation in your back.   He can also take tramadol as needed for severe pain.  Please avoid ibuprofen since this is upsetting her stomach.  Please take Protonix which will help protect her stomach.  He can use ice and/or heat to see if this helps with your back pain and shoulder pain.  Please follow-up with me in 2-3 weeks

## 2017-02-15 ENCOUNTER — Telehealth: Payer: Self-pay | Admitting: *Deleted

## 2017-02-15 NOTE — Telephone Encounter (Signed)
Patient states that Pharmacy is only able to give her 14 of the tramadol instead of the prescribed amount. Patient states bc of this she will run out early and need a new rx and wanted MD to be aware.

## 2017-02-18 NOTE — Telephone Encounter (Signed)
I spoke with pharmacy and they stated that patient has already picked up the 40 tablets.  I called patient to confirm this and she said that it had already been taken care of.  [facility]

## 2017-02-18 NOTE — Telephone Encounter (Signed)
Covering inbox for Dr. Dallas Schimke.   I am unsure why the pharmacy would only give her 14 pills when she was prescribed 40. Blue team please call patient and ask which pharmacy she went to so we can clarify what the issue is. Thank you.   Smitty Cords, MD [place], PGY-3

## 2017-02-28 ENCOUNTER — Ambulatory Visit: Payer: Medicare Other | Admitting: Internal Medicine

## 2017-03-11 ENCOUNTER — Other Ambulatory Visit: Payer: Self-pay | Admitting: Internal Medicine

## 2017-03-11 IMAGING — CR DG ABDOMEN ACUTE W/ 1V CHEST
3 series · 3 of 3 positions shown · non-contrast
Comparison: Abdominal CT 01/31/2014

CLINICAL DATA: Abdominal pain and vomiting.

EXAM:
DG ABDOMEN ACUTE W/ 1V CHEST

[w chest pa]
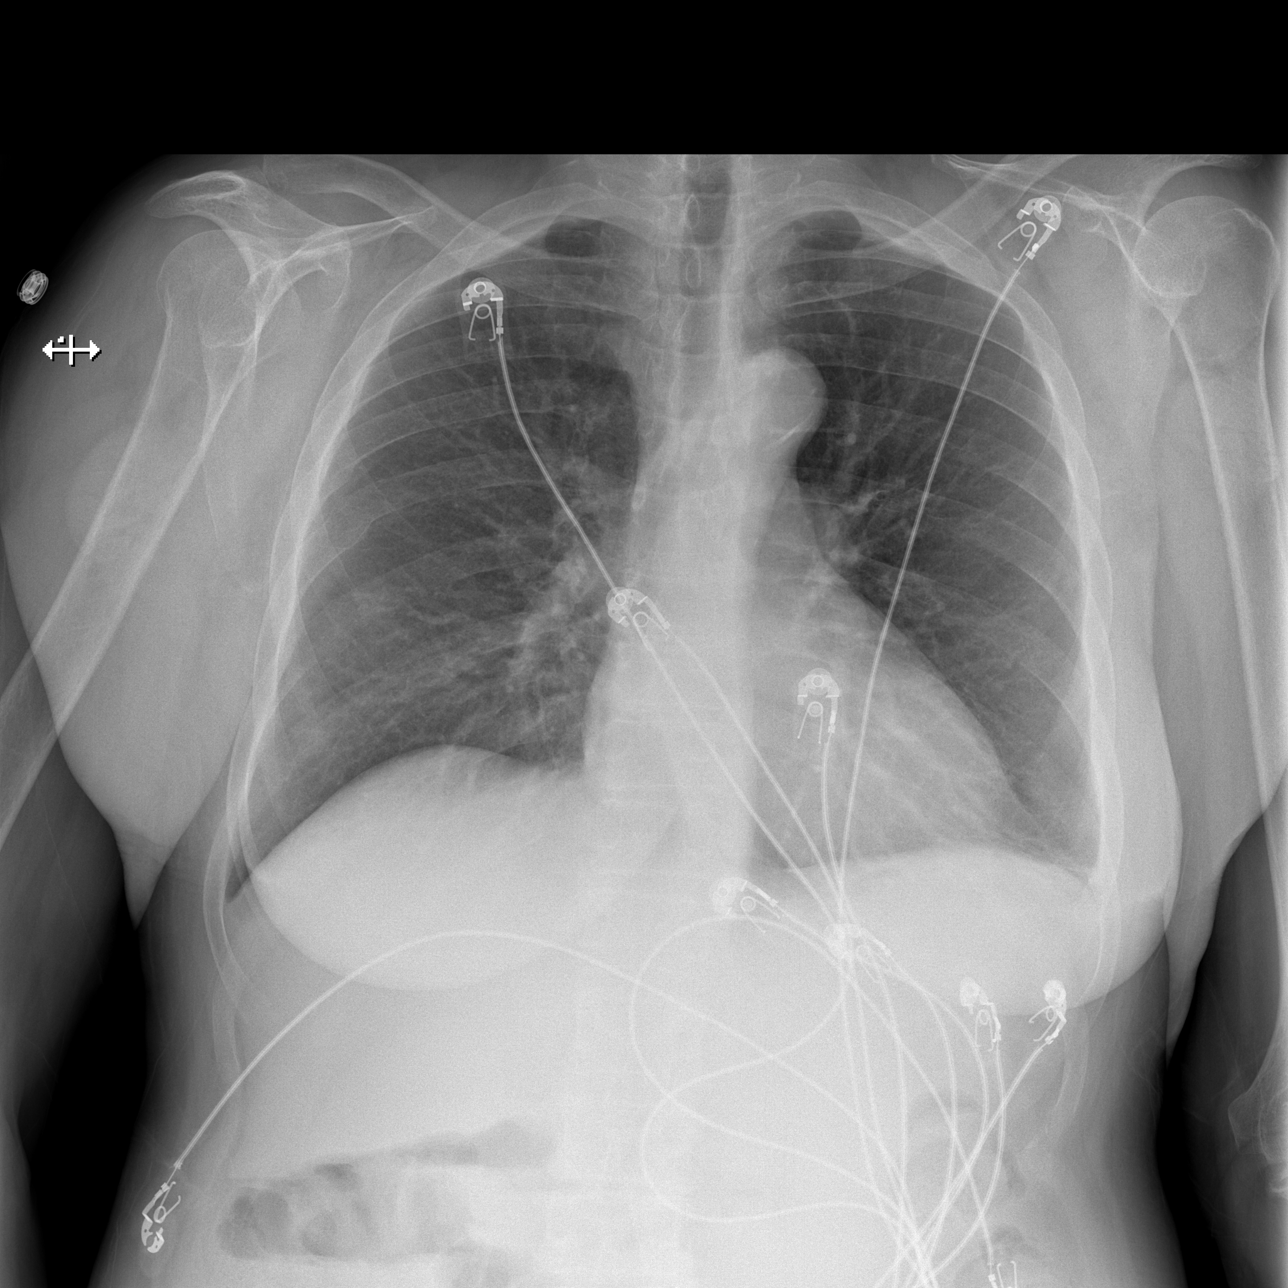

[w abdomen upright]
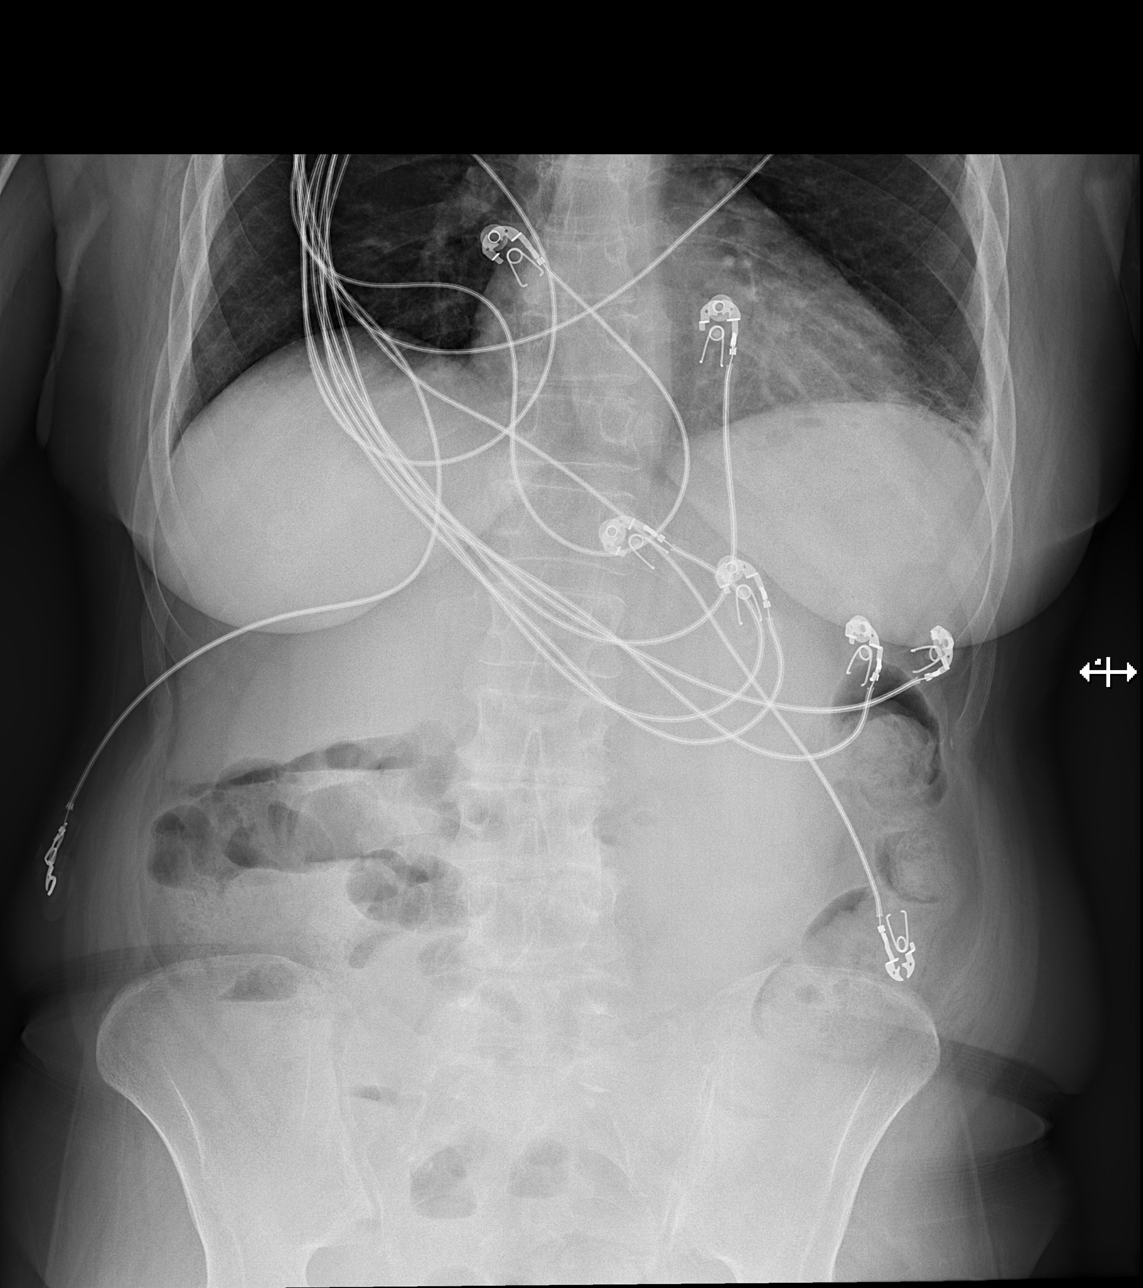

[t abdomen supine]
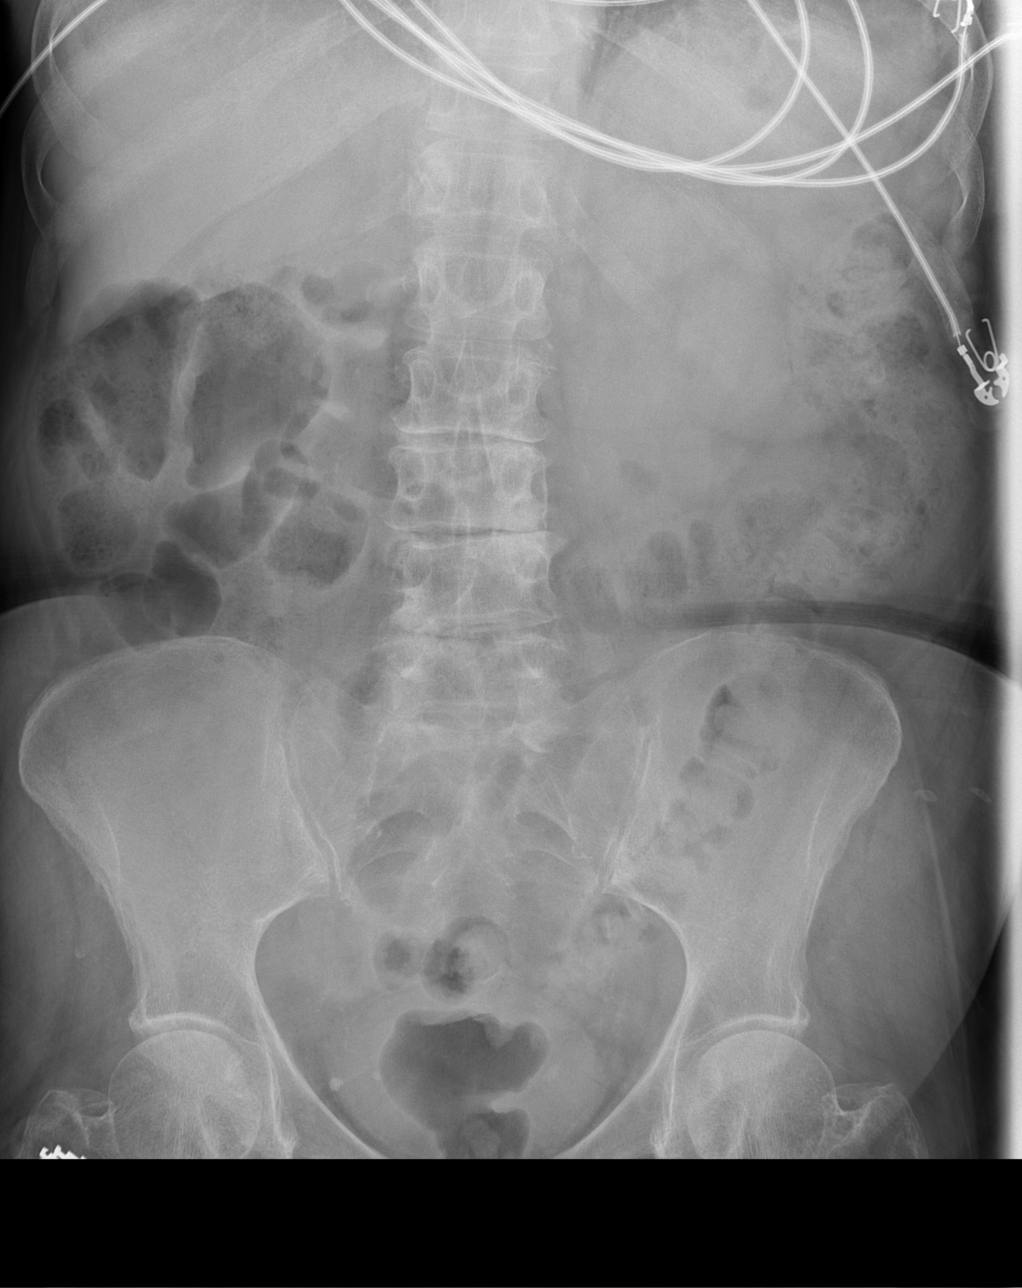

[3 of 3 positions shown; findings below may reference images not displayed]

FINDINGS: There is no evidence of dilated bowel loops or free intraperitoneal
air. Moderate volume of stool is present, without indication of
impaction. No radiopaque calculi seen. Noted venous and arterial
calcifications.

Normal heart size and mediastinal contours. Few linear opacities at
the left base compatible with mild atelectasis.
IMPRESSION: Negative abdominal radiographs.  No acute cardiopulmonary disease.

## 2017-03-13 MED ORDER — TRAMADOL HCL 50 MG PO TABS: 50.0000 mg | ORAL_TABLET | Freq: Two times a day (BID) | ORAL | 0 refills | Status: DC | PRN

## 2017-03-13 NOTE — Telephone Encounter (Addendum)
Called patient to discuss refill Tramadol. She was seen on 11/1 for her back pain. We decided to do a trial of Steroid taper and refilled her Tramadol. We also talked about the importance of seeing the neurosurgeon due to her imaging results. We initially discussed the need to follow up with neurosurgeon urgently in June 2018. We decided to follow up in 2 weeks, but patient cancelled this appointment. When asked why she reported she had another appointment and also that she doesn't like going to the doctors because she feels like nothing is done for her pain. Patient is frustrated about her pain. I explained to her that unfortunately this pain cannot be completely resolved after one office visit We have been trying different methods to control her pain, one of which is the urgent referral to neurosurgery. Additionally, I explained to her the importance of close and more frequent follow ups when there are these issues that need close monitoring. It is difficult to try to address an issue like this and other concerns without multiple follow ups. She understands. She has an appointment with neurosurgery on 12/3. I asked her to make a follow up appointment after she is seen there. In the mean time I printed refill of tramadol and sent the other medication refill requests.

## 2017-03-13 NOTE — Addendum Note (Signed)
Addended by: Smiley Houseman on: 03/13/2017 10:14 AM   Modules accepted: Orders

## 2017-03-18 DIAGNOSIS — M5441 Lumbago with sciatica, right side: Secondary | ICD-10-CM | POA: Diagnosis not present

## 2017-03-19 ENCOUNTER — Other Ambulatory Visit: Payer: Self-pay | Admitting: Internal Medicine

## 2017-03-21 ENCOUNTER — Other Ambulatory Visit: Payer: Self-pay | Admitting: *Deleted

## 2017-03-21 MED ORDER — CARVEDILOL 3.125 MG PO TABS
3.1250 mg | ORAL_TABLET | Freq: Two times a day (BID) | ORAL | 0 refills | Status: DC
Start: 2017-03-21 — End: 2017-11-26

## 2017-03-22 ENCOUNTER — Telehealth: Payer: Self-pay | Admitting: *Deleted

## 2017-03-22 NOTE — Telephone Encounter (Signed)
Patient left message on nurse line stating she's been out of BP med for 2 days and now BP is 197/70. 30 day supply sent on 03/13/2017 and 90 day supply sent on 03/21/2017. Verified with [facility] that patient picked up carvedilol on 03/13/2017. Message left on patient's VM that she has this medication..requested return call for any questions or concerns. Hubbard Hartshorn, RN, BSN   .

## 2017-04-10 ENCOUNTER — Ambulatory Visit: Payer: Medicare Other | Admitting: Family Medicine

## 2017-04-29 ENCOUNTER — Ambulatory Visit: Payer: Medicare Other | Admitting: Internal Medicine

## 2017-05-03 ENCOUNTER — Other Ambulatory Visit: Payer: Self-pay

## 2017-05-03 ENCOUNTER — Encounter: Payer: Self-pay | Admitting: Internal Medicine

## 2017-05-03 ENCOUNTER — Telehealth: Payer: Self-pay

## 2017-05-03 ENCOUNTER — Ambulatory Visit (INDEPENDENT_AMBULATORY_CARE_PROVIDER_SITE_OTHER): Payer: Medicare Other | Admitting: Internal Medicine

## 2017-05-03 VITALS — BP 158/58 | HR 78 | Temp 97.8°F | Wt 145.0 lb

## 2017-05-03 DIAGNOSIS — R7309 Other abnormal glucose: Secondary | ICD-10-CM

## 2017-05-03 DIAGNOSIS — N739 Female pelvic inflammatory disease, unspecified: Secondary | ICD-10-CM | POA: Diagnosis not present

## 2017-05-03 LAB — POCT GLYCOSYLATED HEMOGLOBIN (HGB A1C): Hemoglobin A1C: 6.2

## 2017-05-03 NOTE — Telephone Encounter (Signed)
Pt came in to see Dr. Brett Albino today. Pt gave me transportation forms for her pcp to fill out. Pt also, gave me diabetic shoe information. I explained to pt, she is over due for a diabetic exam, and will need to schedule an apt. Apt scheduled for 1/28. Please fax transportation information to the number provided on the form. Pt informed you will discuss diabetic shoes at visit. Forms place in pcp box.

## 2017-05-03 NOTE — Patient Instructions (Signed)
It was so nice to meet you!  You are doing a great job taking care of the boils. Please keep using the triple antibiotic ointment and warm compresses to the area.  Please schedule an appointment with Dr. Dallas Schimke as soon as possible for a diabetes visit.  -Dr. Brett Albino

## 2017-05-03 NOTE — Assessment & Plan Note (Addendum)
Has 4 small abscesses that have spontaneously ruptured on their own. No signs of overlying cellulitis. No systemic symptoms.  - Given the fact that the abscesses are already healing, do not think she warrants oral antibiotics at this time. - Continue warm compresses and triple antibiotic ointment - Return precautions discussed - Follow-up as needed

## 2017-05-03 NOTE — Progress Notes (Signed)
   [facility] Phone: [phone]  Subjective:  Margaret Rangel is a 72 year old female presenting to clinic with boils of her genital area for the last week. She had 4 small boils that popped up. She has been using boil ease, triple antibiotic ointment, and warm compresses to the area, which has helped a lot. The boils all popped open at the same time 3 days ago. The boils drained a mixture of pus and blood. She has not had any fevers, chills, nausea, or vomiting. No spreading redness.  ROS: See HPI for pertinent positives and negatives  Past Medical History- HTN, chronic asthma, DM, osteoporosis, GAD  Family history reviewed for today's visit. No changes.  Social history- patient is a former smoker, quit in 2017.  Objective: BP (!) 158/58   Pulse 78   Temp 97.8 F (36.6 C) (Oral)   Wt 145 lb (65.8 kg)   SpO2 99%   BMI 25.69 kg/m  Gen: NAD, alert, cooperative with exam GU: Four small well-healing nodules present on the right side of the mons pubis and the right groin. No fluctuance. No drainage. No erythema.  Assessment/Plan: Abscess of Female Genitalia: Has 4 small abscesses that have spontaneously ruptured on their own. No signs of overlying cellulitis. No systemic symptoms. - Given the fact that the abscesses are already healing, do not think she warrants oral antibiotics at this time. - Continue warm compresses and triple antibiotic ointment - Return precautions discussed - Follow-up as needed  BP uncontrolled today- follow-up appointment scheduled with PCP on 05/13/17 to address HTN and diabetes.   Hyman Bible, MD PGY-3

## 2017-05-08 NOTE — Telephone Encounter (Signed)
Daughter, Regina Eck, informed that they can pick up the form. Adrea Sherpa, Salome Spotted, CMA

## 2017-05-08 NOTE — Telephone Encounter (Signed)
Completed the form, however I am not able to fax the forms yet as patient has not signed the authorization to release information. Will place form in RN box for her to pick up and sign. Please inform patient.

## 2017-05-11 NOTE — Progress Notes (Signed)
   [facility] Phone: [phone]   Date of Visit: 05/13/2017   HPI:  DM2: - hemoglobin A1c 6.2 in 05/03/17 - Medications: amaryl 2mg  daily - patient requests diabetic shoes. Reports that she had been getting these shoes byt her prior PCP.   Boil on the Groin/RLQ abdominal pain:  - patient was seen on 1/18 in clinic and diagnosed with abscess on her groin. They were resolving and therefore antibiotics were not indicated  - reports most of her boils have resolved but there is one that drained on its own about two days ago - after it "burst" she reports she started having right lower quadrant pain.  - no fevers, no vomiting, no diarrhea - the drainage from the boil stopped today.  - history of hysterectomy, no other abdominal surgeries   ROS: See HPI.  [place]:  PMH HTN Chronic Asthma GER with history of esophageal stricture  Diverticulosis of Colon DM2 Osteoporosis OA R hip History of Colonic Polyps  History of Stroke Seasonal Allergies Systolic Murmur GAD  PHYSICAL EXAM: BP (!) 144/60   Pulse 65   Temp (!) 97.4 F (36.3 C) (Oral)   Wt 146 lb (66.2 kg)   SpO2 99%   BMI 25.86 kg/m  GEN: NAD, nontoxic CV: RRR, no murmurs, rubs, or gallops PULM: CTAB, normal effort ABD: soft, tenderness in the right lower quadrant mainly near the anterior superior iliac spine and the pelvic brim near this area. No rebound.  GU: Female genitalia: normal external genitalia, vulva, vagina, and adnexa. Reports of tenderness to palpation of the right adnexa but there are not masses palpated. Right groin there is a 1cm diameter abscess with some fluctuance.  SKIN: No rash or cyanosis; warm and well-perfused EXTR: No lower extremity edema or calf tenderness PSYCH: Mood and affect euthymic, normal rate and volume of speech NEURO: Awake, alert, no focal deficits grossly, normal speech Diabetic Foot Exam - Simple   Simple Foot Form Diabetic Foot exam was performed  with the following findings:  Yes 05/13/2017  5:16 PM  Visual Inspection No deformities, no ulcerations, no other skin breakdown bilaterally:  Yes Sensation Testing Intact to touch and monofilament testing bilaterally:  Yes Pulse Check Posterior Tibialis and Dorsalis pulse intact bilaterally:  Yes Comments    ASSESSMENT/PLAN:  Right Groin Abscess/RLQ abdominal pain: Patient declined I&D for groin abscess in the clinic. Discussed the risks of not doing I&D. Patient wants to do warm compresses. No surrounding cellulitis. RLQ pain is unclear in etiology. Less likely it is due to abscess of the groin. Considered GYN pathology but exam was unremarkable. Less likely this is appendicitis. Reports the pain is more on the bone (iliac crest). Will obtain CBC with diff and SER to see if there is sign of infectious process. If so, will plan to obtain CT abdomen/pelvis. Discussed with Dr. Walker Kehr. ED precautions discussed.   Of note, I do not think diabetic shoes are indicated for patient. Her pain may be due to her history of calcaneal fracture. Will refer to podiatry to determine the best support shoes for patient.   Smiley Houseman, MD PGY [place]

## 2017-05-13 ENCOUNTER — Ambulatory Visit (INDEPENDENT_AMBULATORY_CARE_PROVIDER_SITE_OTHER): Payer: Medicare Other | Admitting: Internal Medicine

## 2017-05-13 ENCOUNTER — Encounter: Payer: Self-pay | Admitting: Internal Medicine

## 2017-05-13 ENCOUNTER — Other Ambulatory Visit: Payer: Self-pay

## 2017-05-13 ENCOUNTER — Telehealth: Payer: Self-pay | Admitting: Internal Medicine

## 2017-05-13 VITALS — BP 144/60 | HR 65 | Temp 97.4°F | Wt 146.0 lb

## 2017-05-13 DIAGNOSIS — E118 Type 2 diabetes mellitus with unspecified complications: Secondary | ICD-10-CM

## 2017-05-13 DIAGNOSIS — Z1159 Encounter for screening for other viral diseases: Secondary | ICD-10-CM

## 2017-05-13 DIAGNOSIS — R1031 Right lower quadrant pain: Secondary | ICD-10-CM

## 2017-05-13 MED ORDER — BENZONATATE 100 MG PO CAPS: 100.0000 mg | ORAL_CAPSULE | Freq: Three times a day (TID) | ORAL | 0 refills | Status: DC | PRN

## 2017-05-13 MED ORDER — NICOTINE 14 MG/24HR TD PT24: 14.0000 mg | MEDICATED_PATCH | Freq: Every day | TRANSDERMAL | 0 refills | Status: DC

## 2017-05-13 NOTE — Patient Instructions (Signed)
Let's get some blood work  I will call you with the results.   Follow up in 2-3 weeks.

## 2017-05-13 NOTE — Telephone Encounter (Signed)
Pt was in office this morning to see Gunadasa. Pt said she mentioned her cough to her and she thought she was getting something called in for it. Please advise

## 2017-05-13 NOTE — Telephone Encounter (Signed)
I called back the patient and reported that we did not discuss this in detail because we set our agenda to discuss two other topics. I told her that I can send in tessalon PRN but she needs to come in to be seen soon. She said she will be able to come in 2/12. Appointment made.  I told her that I will no longer prescribe something for an issue we did not fully discuss.

## 2017-05-14 LAB — CBC WITH DIFFERENTIAL/PLATELET
Basophils Absolute: 0.1 10*3/uL (ref 0.0–0.2)
Basos: 1 %
EOS (ABSOLUTE): 0.4 10*3/uL (ref 0.0–0.4)
Eos: 5 %
Hematocrit: 31.1 % — ABNORMAL LOW (ref 34.0–46.6)
Hemoglobin: 9.5 g/dL — ABNORMAL LOW (ref 11.1–15.9)
Immature Grans (Abs): 0 10*3/uL (ref 0.0–0.1)
Immature Granulocytes: 0 %
Lymphocytes Absolute: 3.9 10*3/uL — ABNORMAL HIGH (ref 0.7–3.1)
Lymphs: 45 %
MCH: 20.8 pg — ABNORMAL LOW (ref 26.6–33.0)
MCHC: 30.5 g/dL — ABNORMAL LOW (ref 31.5–35.7)
MCV: 68 fL — ABNORMAL LOW (ref 79–97)
Monocytes Absolute: 0.9 10*3/uL (ref 0.1–0.9)
Monocytes: 10 %
Neutrophils Absolute: 3.4 10*3/uL (ref 1.4–7.0)
Neutrophils: 39 %
Platelets: 430 10*3/uL — ABNORMAL HIGH (ref 150–379)
RBC: 4.57 x10E6/uL (ref 3.77–5.28)
RDW: 18.8 % — ABNORMAL HIGH (ref 12.3–15.4)
WBC: 8.7 10*3/uL (ref 3.4–10.8)

## 2017-05-14 LAB — SEDIMENTATION RATE: Sed Rate: 31 mm/hr (ref 0–40)

## 2017-05-15 ENCOUNTER — Telehealth: Payer: Self-pay | Admitting: Internal Medicine

## 2017-05-15 ENCOUNTER — Encounter: Payer: Self-pay | Admitting: Internal Medicine

## 2017-05-15 ENCOUNTER — Other Ambulatory Visit: Payer: Self-pay | Admitting: Internal Medicine

## 2017-05-15 DIAGNOSIS — M79672 Pain in left foot: Principal | ICD-10-CM

## 2017-05-15 DIAGNOSIS — G8929 Other chronic pain: Secondary | ICD-10-CM

## 2017-05-15 DIAGNOSIS — R1031 Right lower quadrant pain: Secondary | ICD-10-CM

## 2017-05-15 NOTE — Progress Notes (Signed)
Pt contacted and informed of referral.

## 2017-05-15 NOTE — Progress Notes (Signed)
Regarding her chronic left heel pain, I think it would be best that she follow podiatry so they can recommend the appropriate foot wear for her. I have made a referral. Please inform patient

## 2017-05-15 NOTE — Telephone Encounter (Signed)
Pt would like a call back from [place]. She says she is in a lot of pain since her procedure last Friday. Please advise

## 2017-05-15 NOTE — Telephone Encounter (Signed)
Called patient back. She reports that she needs her diabetic shoes. 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   She was more concerned about her shoes. I asked how her RLQ abdominal pain is doing. She reports it was bad yesterday but not as bad today but she continues to have pain. Her CBC did not show any leukocytosis and ESR was normal. I will go ahead and order CT abdomen/pelvis for further evaluation. Hopefully, we can get this done today or tomorrow. ED precautions discussed.   Forward to [facility] team to make an appointment for patient for CT abd/pelvis.

## 2017-05-15 NOTE — Telephone Encounter (Signed)
CT scheduled for 2/6 per pts request at 11am over at [facility]. Pt has been notified and directions given.

## 2017-05-17 ENCOUNTER — Telehealth: Payer: Self-pay

## 2017-05-17 DIAGNOSIS — I10 Essential (primary) hypertension: Secondary | ICD-10-CM

## 2017-05-17 NOTE — Telephone Encounter (Signed)
Received call from [place] in [place] at [facility]. Patient scheduled for CT abd/pelvis with contrast on 05/22/17.  Please enter order for STAT creatinine that they can draw when patient arrives. Also needs a copy of the order for CT abd/pelvis with contrast faxed to [facility] at [phone]. Beside the physician name on order please place the date ordered. Danley Danker, RN [facility] RN)

## 2017-05-17 NOTE — Telephone Encounter (Signed)
Creatinine ordered.   Will forward to blue team to fax the copy of the order

## 2017-05-20 NOTE — Telephone Encounter (Signed)
Spoke with Morey Hummingbird with Radiology scheduling and she can view the order for this CT scan in Epic and there are no issues with it.  Fax isn't necessary. Galia Rahm,CMA

## 2017-05-22 ENCOUNTER — Ambulatory Visit (HOSPITAL_COMMUNITY): Admission: RE | Admit: 2017-05-22 | Payer: Medicare Other | Source: Ambulatory Visit

## 2017-05-22 ENCOUNTER — Telehealth: Payer: Self-pay | Admitting: Internal Medicine

## 2017-05-22 NOTE — Telephone Encounter (Signed)
Pt is calling to speak to Dr. Dallas Schimke about her pain. jw

## 2017-05-22 NOTE — Telephone Encounter (Signed)
Patient states that she had to reschedule her CT scan due to needing to get her contrast and she didn't have a ride.  She is getting it next week.  Patient states that she is still experiencing a lot of discomfort with the boils especially the ones in her private area.  She wanted to let you know that she is still using the triple antibiotic ointment but would like to know if there is anything she can try for the discomfort.  I advised patient to take a warm bath or use a warm compress as this may help with draining and relieving pressure.  She voiced understanding but will forward this note to provider to see what can be done.  Patient states that she doesn't have a ride to come into the office or else she would have made an apppt. Maddyn Lieurance,CMA

## 2017-05-23 NOTE — Telephone Encounter (Signed)
Agree with Margaret Rangel, I would recommend doing baths or warm compresses at least 3-4 times a day. She can take tylenol for the pain. If ultimately she is not improving or the boils are worsening. She needs to make an appointment. If she can't find transport, we can discuss with Ms Laurance Flatten to see how we can help.  Harvey Lingo

## 2017-05-27 NOTE — Telephone Encounter (Signed)
Received another call from [place] in [place] at [facility]. She states the creatinine needs to be ordered in a different way and still needs a printed order faxed with the doctor's signature and date ordered written on it. Please call her so she can tell you what is needed. [phone]. Danley Danker, RN [facility] RN)

## 2017-05-28 ENCOUNTER — Other Ambulatory Visit: Payer: Self-pay | Admitting: Internal Medicine

## 2017-05-28 ENCOUNTER — Ambulatory Visit: Payer: Medicare Other | Admitting: Internal Medicine

## 2017-05-28 DIAGNOSIS — R109 Unspecified abdominal pain: Secondary | ICD-10-CM

## 2017-05-28 NOTE — Telephone Encounter (Signed)
Called and faxed today

## 2017-05-28 NOTE — Addendum Note (Signed)
Addended by: Smiley Houseman on: 05/28/2017 07:42 AM   Modules accepted: Orders

## 2017-05-28 NOTE — Progress Notes (Addendum)
  Ordered the istat creatinine as requested by imaging

## 2017-05-29 ENCOUNTER — Encounter (HOSPITAL_COMMUNITY): Payer: Self-pay

## 2017-05-29 ENCOUNTER — Ambulatory Visit (HOSPITAL_COMMUNITY)
Admission: RE | Admit: 2017-05-29 | Discharge: 2017-05-29 | Disposition: A | Discharge status: Home / Self Care | Payer: Medicare Other | Source: Ambulatory Visit | Attending: Family Medicine | Admitting: Family Medicine

## 2017-05-29 DIAGNOSIS — I700 Atherosclerosis of aorta: Secondary | ICD-10-CM | POA: Diagnosis not present

## 2017-05-29 DIAGNOSIS — R1031 Right lower quadrant pain: Secondary | ICD-10-CM

## 2017-05-29 DIAGNOSIS — K5730 Diverticulosis of large intestine without perforation or abscess without bleeding: Secondary | ICD-10-CM | POA: Diagnosis not present

## 2017-05-29 LAB — POCT I-STAT CREATININE: Creatinine, Ser: 0.8 mg/dL (ref 0.44–1.00)

## 2017-05-29 MED ORDER — IOPAMIDOL (ISOVUE-300) INJECTION 61%
100.0000 mL | Freq: Once | INTRAVENOUS | Status: AC | PRN
  Administered 2017-05-29: 100 mL via INTRAVENOUS

## 2017-05-29 MED ORDER — IOPAMIDOL (ISOVUE-300) INJECTION 61%
INTRAVENOUS | Status: AC
  Filled 2017-05-29: qty 100

## 2017-05-30 ENCOUNTER — Telehealth: Payer: Self-pay | Admitting: Internal Medicine

## 2017-05-30 NOTE — Telephone Encounter (Signed)
Please inform Margaret Rangel that her CT abdomen is normal.

## 2017-05-31 ENCOUNTER — Telehealth: Payer: Self-pay | Admitting: Internal Medicine

## 2017-05-31 NOTE — Telephone Encounter (Signed)
Would like results from CT scan from Wednesday. She is in severe pain

## 2017-05-31 NOTE — Telephone Encounter (Signed)
LM for patient that CT was normal.  Jazmin Hartsell,CMA

## 2017-05-31 NOTE — Telephone Encounter (Signed)
Patient informed of normal results.  I offered an appointment but she declined.  She will seek care in the ED if the pain doesn't resolve or improve by Monday.  Berlyn Saylor,CMA

## 2017-05-31 NOTE — Telephone Encounter (Signed)
Pt wants to know what does normal mean?

## 2017-05-31 NOTE — Telephone Encounter (Signed)
Informed patient of what "normal" means and she voiced understanding. Margaret Rangel,CMA

## 2017-06-07 ENCOUNTER — Ambulatory Visit: Payer: Medicare Other | Admitting: Podiatry

## 2017-06-12 ENCOUNTER — Ambulatory Visit: Payer: Medicare Other | Admitting: Podiatry

## 2017-06-17 ENCOUNTER — Other Ambulatory Visit: Payer: Self-pay | Admitting: Cardiology

## 2017-06-17 ENCOUNTER — Other Ambulatory Visit: Payer: Self-pay

## 2017-06-17 MED ORDER — CALCIUM CARB-CHOLECALCIFEROL 600-800 MG-UNIT PO TABS: ORAL_TABLET | ORAL | 1 refills | Status: DC

## 2017-06-17 NOTE — Telephone Encounter (Signed)
Pt requesting refill on calcium. Rx to be sent to [place], RN

## 2017-06-18 ENCOUNTER — Telehealth: Payer: Self-pay | Admitting: Internal Medicine

## 2017-06-18 NOTE — Telephone Encounter (Signed)
Pt would like [place] to re-send her Calcium medication to [facility] since she called today and said that they have not received anything from [place] yet. jw

## 2017-06-18 NOTE — Telephone Encounter (Signed)
Called [facility] to check on this- they did receive this Rx but state its OTC and insurance does not cover. Left message for patient with above information. To call back with questions.  Wallace Cullens, RN

## 2017-06-18 NOTE — Telephone Encounter (Signed)
Pt should be having this followed/filled by PCP.  Please let pt & pharmacy know

## 2017-06-20 ENCOUNTER — Other Ambulatory Visit: Payer: Self-pay | Admitting: Cardiology

## 2017-06-24 ENCOUNTER — Emergency Department (HOSPITAL_COMMUNITY): Payer: Medicare Other

## 2017-06-24 ENCOUNTER — Emergency Department (HOSPITAL_COMMUNITY)
Admission: EM | Admit: 2017-06-24 | Discharge: 2017-06-24 | Disposition: A | Discharge status: Home / Self Care | Payer: Medicare Other | Attending: Emergency Medicine | Admitting: Emergency Medicine

## 2017-06-24 ENCOUNTER — Other Ambulatory Visit: Payer: Self-pay

## 2017-06-24 ENCOUNTER — Encounter (HOSPITAL_COMMUNITY): Payer: Self-pay

## 2017-06-24 DIAGNOSIS — R1031 Right lower quadrant pain: Secondary | ICD-10-CM | POA: Diagnosis not present

## 2017-06-24 DIAGNOSIS — I10 Essential (primary) hypertension: Secondary | ICD-10-CM | POA: Diagnosis not present

## 2017-06-24 DIAGNOSIS — Z7982 Long term (current) use of aspirin: Secondary | ICD-10-CM | POA: Insufficient documentation

## 2017-06-24 DIAGNOSIS — J45909 Unspecified asthma, uncomplicated: Secondary | ICD-10-CM | POA: Insufficient documentation

## 2017-06-24 DIAGNOSIS — R109 Unspecified abdominal pain: Secondary | ICD-10-CM | POA: Diagnosis not present

## 2017-06-24 DIAGNOSIS — Z87891 Personal history of nicotine dependence: Secondary | ICD-10-CM | POA: Insufficient documentation

## 2017-06-24 DIAGNOSIS — E119 Type 2 diabetes mellitus without complications: Secondary | ICD-10-CM | POA: Insufficient documentation

## 2017-06-24 DIAGNOSIS — R1084 Generalized abdominal pain: Secondary | ICD-10-CM | POA: Insufficient documentation

## 2017-06-24 DIAGNOSIS — E876 Hypokalemia: Secondary | ICD-10-CM | POA: Diagnosis not present

## 2017-06-24 DIAGNOSIS — R1011 Right upper quadrant pain: Secondary | ICD-10-CM | POA: Diagnosis not present

## 2017-06-24 DIAGNOSIS — K5900 Constipation, unspecified: Secondary | ICD-10-CM | POA: Diagnosis not present

## 2017-06-24 LAB — COMPREHENSIVE METABOLIC PANEL
ALT: 13 U/L — ABNORMAL LOW (ref 14–54)
AST: 21 U/L (ref 15–41)
Albumin: 4 g/dL (ref 3.5–5.0)
Alkaline Phosphatase: 67 U/L (ref 38–126)
Anion gap: 12 (ref 5–15)
BUN: 9 mg/dL (ref 6–20)
CO2: 23 mmol/L (ref 22–32)
Calcium: 9.2 mg/dL (ref 8.9–10.3)
Chloride: 106 mmol/L (ref 101–111)
Creatinine, Ser: 0.98 mg/dL (ref 0.44–1.00)
GFR calc Af Amer: >60 mL/min (ref >60)
GFR calc non Af Amer: 56 mL/min — ABNORMAL LOW (ref >60)
Glucose, Bld: 130 mg/dL — ABNORMAL HIGH (ref 65–99)
Potassium: 3 mmol/L — ABNORMAL LOW (ref 3.5–5.1)
Sodium: 141 mmol/L (ref 135–145)
Total Bilirubin: 0.6 mg/dL (ref 0.3–1.2)
Total Protein: 7.3 g/dL (ref 6.5–8.1)

## 2017-06-24 LAB — URINALYSIS, ROUTINE W REFLEX MICROSCOPIC
Bilirubin Urine: NEGATIVE
Glucose, UA: NEGATIVE mg/dL
Hgb urine dipstick: NEGATIVE
Ketones, ur: NEGATIVE mg/dL
Leukocytes, UA: NEGATIVE
Nitrite: NEGATIVE
Protein, ur: NEGATIVE mg/dL
Specific Gravity, Urine: 1.002 — ABNORMAL LOW (ref 1.005–1.030)
pH: 6 (ref 5.0–8.0)

## 2017-06-24 LAB — CBC WITH DIFFERENTIAL/PLATELET
Basophils Absolute: 0.1 10*3/uL (ref 0.0–0.1)
Basophils Relative: 1 %
Eosinophils Absolute: 0.6 10*3/uL (ref 0.0–0.7)
Eosinophils Relative: 6 %
HCT: 29.6 % — ABNORMAL LOW (ref 36.0–46.0)
Hemoglobin: 8.9 g/dL — ABNORMAL LOW (ref 12.0–15.0)
Lymphocytes Relative: 44 %
Lymphs Abs: 4.5 10*3/uL — ABNORMAL HIGH (ref 0.7–4.0)
MCH: 20.4 pg — ABNORMAL LOW (ref 26.0–34.0)
MCHC: 30.1 g/dL (ref 30.0–36.0)
MCV: 67.7 fL — ABNORMAL LOW (ref 78.0–100.0)
Monocytes Absolute: 0.9 10*3/uL (ref 0.1–1.0)
Monocytes Relative: 9 %
Neutro Abs: 4 10*3/uL (ref 1.7–7.7)
Neutrophils Relative %: 40 %
Platelets: 332 10*3/uL (ref 150–400)
RBC: 4.37 MIL/uL (ref 3.87–5.11)
RDW: 19 % — ABNORMAL HIGH (ref 11.5–15.5)
Smear Review: ADEQUATE
WBC: 10.1 10*3/uL (ref 4.0–10.5)

## 2017-06-24 LAB — LIPASE, BLOOD: Lipase: 36 U/L (ref 11–51)

## 2017-06-24 MED ORDER — SODIUM CHLORIDE 0.9 % IV BOLUS (SEPSIS)
1000.0000 mL | Freq: Once | INTRAVENOUS | Status: AC
  Administered 2017-06-24: 1000 mL via INTRAVENOUS

## 2017-06-24 MED ORDER — POLYETHYLENE GLYCOL 3350 17 G PO PACK: 17.0000 g | PACK | Freq: Every day | ORAL | 0 refills | Status: DC

## 2017-06-24 MED ORDER — POTASSIUM CHLORIDE CRYS ER 20 MEQ PO TBCR
40.0000 meq | EXTENDED_RELEASE_TABLET | Freq: Once | ORAL | Status: AC
  Administered 2017-06-24: 40 meq via ORAL
  Filled 2017-06-24: qty 2

## 2017-06-24 MED ORDER — IOPAMIDOL (ISOVUE-300) INJECTION 61%
INTRAVENOUS | Status: AC
  Administered 2017-06-24: 100 mL via INTRAVENOUS
  Filled 2017-06-24: qty 100

## 2017-06-24 MED ORDER — FENTANYL CITRATE (PF) 100 MCG/2ML IJ SOLN
50.0000 ug | Freq: Once | INTRAMUSCULAR | Status: AC
  Administered 2017-06-24: 50 ug via INTRAVENOUS
  Filled 2017-06-24: qty 2

## 2017-06-24 MED ORDER — DOCUSATE SODIUM 100 MG PO CAPS: 100.0000 mg | ORAL_CAPSULE | Freq: Two times a day (BID) | ORAL | 0 refills | Status: DC

## 2017-06-24 MED ORDER — POTASSIUM CHLORIDE CRYS ER 20 MEQ PO TBCR: 20.0000 meq | EXTENDED_RELEASE_TABLET | Freq: Every day | ORAL | 0 refills | Status: DC

## 2017-06-24 NOTE — ED Provider Notes (Signed)
[facility] EMERGENCY DEPARTMENT Provider Note   CSN: [id number] Arrival date & time: 06/24/17  1653     History   Chief Complaint Chief Complaint  Patient presents with  . Abdominal Pain    HPI Margaret Rangel is a 72 y.o. female.  HPI  72 year old female presents with acute abdominal pain.  She was eating some food because she thought her sugar was getting low at 10:45 AM.  At around 11 AM she developed acute abdominal pain.  It is mostly been starting in the lower abdomen and seems to radiate up to her upper abdomen.  She is also having back pain.  She denies any shortness of breath.  No fevers or urinary symptoms.  The pain is sharp and severe.  She vomited once when it first started but has not vomited since.  Her last bowel movement was 2-3 days ago which is atypical for her.  She states she has not passed any gas today. She feels like her abdomen is distended.  Past Medical History:  Diagnosis Date  . Abdominal pain   . Anal fissure   . Arthritis   . Asthma   . Chronic back pain   . Colon adenoma 2009, 2012   Colonoscopy  . Diverticulosis of colon (without mention of hemorrhage) 2009   Colonoscopy  . DM (diabetes mellitus) ([place])    off medicines for diabetes 12-18-13  . GERD (gastroesophageal reflux disease)   . Hemorrhoids   . Hernia   . HTN (hypertension)   . Hyperlipidemia    low  . Irritable bowel syndrome (IBS)   . Low blood potassium   . Obese   . Stroke ([place])    x2  . Ulcer     Patient Active Problem List   Diagnosis Date Noted  . Abscess of female genitalia 05/03/2017  . GAD (generalized anxiety disorder) 11/22/2016  . Seasonal allergies 11/22/2016  . Asthma, chronic 11/22/2016  . History of stroke 09/13/2016  . Diverticulosis of colon 09/13/2016  . Osteoarthritis of right hip 05/29/2016  . Osteoporosis 05/01/2016  . Systolic murmur 25/95/6387  . Essential hypertension 04/02/2016  . Chest pain with moderate risk for cardiac  etiology 03/07/2016  . History of esophageal stricture 03/01/2016  . Rectal bleeding 02/02/2014  . Esophageal reflux 02/02/2014  . Personal history of colonic polyps 12/09/2013  . Dysphagia, unspecified(787.20) 09/11/2012  . Nausea alone 09/11/2012  . Diabetes mellitus without mention of complication 56/43/3295    Past Surgical History:  Procedure Laterality Date  . COLONOSCOPY    . FLEXIBLE SIGMOIDOSCOPY N/A 07/14/2012   Procedure: FLEXIBLE SIGMOIDOSCOPY;  Surgeon: Inda Castle, MD;  Location: [facility]COPY;  Service: Endoscopy;  Laterality: N/A;  . HEMORRHOID BANDING N/A 07/14/2012   Procedure: HEMORRHOID BANDING;  Surgeon: Inda Castle, MD;  Location: [facility] ENDOSCOPY;  Service: Endoscopy;  Laterality: N/A;  . PARTIAL HYSTERECTOMY  1971  . POLYPECTOMY    . TONSILLECTOMY      OB History    No data available       Home Medications    Prior to Admission medications   Medication Sig Start Date End Date Taking? Authorizing Provider  albuterol (PROVENTIL HFA;VENTOLIN HFA) 108 (90 Base) MCG/ACT inhaler Inhale 2 puffs into the lungs every 6 (six) hours as needed for wheezing or shortness of breath. 05/29/16   Veatrice Bourbon, MD  aspirin (EQ ASPIRIN ADULT LOW DOSE) 81 MG EC tablet Take 1 tablet (81 mg total) by  mouth daily. Patient not taking: Reported on 05/13/2017 02/14/17   Smiley Houseman, MD  benzonatate (TESSALON PERLES) 100 MG capsule Take 1 capsule (100 mg total) by mouth 3 (three) times daily as needed for cough. 05/13/17   Smiley Houseman, MD  Blood Glucose Calibration (ONETOUCH VERIO) High SOLN 1 each by In Vitro route as directed. 12/26/16   Dickie La, MD  Blood Glucose Calibration (ONETOUCH VERIO) SOLN 1 Bottle by In Vitro route as directed. 12/26/16   Dickie La, MD  Blood Glucose Monitoring Suppl (ONETOUCH VERIO) w/Device KIT 1 kit by Does not apply route 3 (three) times daily. ICD-10 code: E11.9 12/26/16   Dickie La, MD  busPIRone (BUSPAR) 5 MG tablet Take  1 tablet (5 mg total) by mouth 2 (two) times daily. Patient taking differently: Take 5 mg by mouth daily.  11/22/16   Smiley Houseman, MD  Calcium Carb-Cholecalciferol 600-800 MG-UNIT TABS Take one tablet daily 06/17/17   Smiley Houseman, MD  carvedilol (COREG) 3.125 MG tablet Take 1 tablet (3.125 mg total) by mouth 2 (two) times daily. Patient not taking: Reported on 05/13/2017 03/21/17   Smiley Houseman, MD  docusate sodium (COLACE) 100 MG capsule Take 1 capsule (100 mg total) by mouth every 12 (twelve) hours. 06/24/17   Sherwood Gambler, MD  fexofenadine [facility] ALLERGY) 60 MG tablet Take 1 tablet (60 mg total) by mouth 2 (two) times daily. 11/22/16   Smiley Houseman, MD  glimepiride (AMARYL) 2 MG tablet TAKE 1 TABLET BY MOUTH ONCE DAILY WITH  BREAKFAST 03/13/17   Smiley Houseman, MD  glucose blood (ONETOUCH VERIO) test strip Use as instructed to test glucose level three times daily. ICD-10 code: E11.9 12/26/16   Dickie La, MD  Lancet Devices (ONE TOUCH DELICA LANCING DEV) MISC 1 Device by Does not apply route as directed. ICD-10 code: E11.9 12/26/16   Dickie La, MD  [facility] DELICA LANCETS 24O MISC 1 each by Does not apply route 3 (three) times daily. ICD-10 code: E11.9 12/26/16   Dickie La, MD  pantoprazole (PROTONIX) 40 MG tablet Take 1 tablet (40 mg total) by mouth daily. 02/14/17   Smiley Houseman, MD  polyethylene glycol (MIRALAX / GLYCOLAX) packet Take 17 g by mouth daily. 06/24/17   Sherwood Gambler, MD  potassium chloride SA (K-DUR,KLOR-CON) 20 MEQ tablet Take 1 tablet (20 mEq total) by mouth daily. 06/24/17   Sherwood Gambler, MD  rosuvastatin (CRESTOR) 40 MG tablet Take 1 tablet (40 mg total) by mouth daily. Please make overdue yearly appt with Dr. Curt Bears. 2nd attempt 06/20/17   Constance Haw, MD    Family History Family History  Problem Relation Age of Onset  . Prostate cancer Brother   . Heart disease Mother   . Anuerysm Father   . Lung cancer  Brother   . Colon cancer Brother   . Rectal cancer Neg Hx   . Stomach cancer Neg Hx     Social History Social History   Tobacco Use  . Smoking status: Former Smoker    Packs/day: 0.25    Years: 39.00    Pack years: 9.75    Types: Cigarettes    Start date: 04/16/1976    Last attempt to quit: 02/13/2016    Years since quitting: 1.3  . Smokeless tobacco: Never Used  . Tobacco comment: 4-5 cigs per day for 39 years  Substance Use Topics  . Alcohol use: Yes  Alcohol/week: 12.6 oz    Types: 21 Cans of beer per week  . Drug use: Yes    Types: Marijuana    Comment: using for depression     Allergies   Codeine and Penicillins   Review of Systems Review of Systems  Constitutional: Negative for fever.  Respiratory: Negative for shortness of breath.   Gastrointestinal: Positive for abdominal pain, constipation and vomiting. Negative for diarrhea and nausea.  Genitourinary: Negative for dysuria.  Musculoskeletal: Positive for back pain.  All other systems reviewed and are negative.    Physical Exam Updated Vital Signs BP (!) 174/76 (BP Location: Right Arm)   Pulse 81   Temp 98.6 F (37 C) (Oral)   Resp 18   SpO2 100%   Physical Exam  Constitutional: She is oriented to person, place, and time. She appears well-developed and well-nourished.  Non-toxic appearance. She does not appear ill. She appears distressed (in pain).  HENT:  Head: Normocephalic and atraumatic.  Right Ear: External ear normal.  Left Ear: External ear normal.  Nose: Nose normal.  Eyes: Right eye exhibits no discharge. Left eye exhibits no discharge.  Cardiovascular: Normal rate, regular rhythm and normal heart sounds.  Pulmonary/Chest: Effort normal and breath sounds normal.  Abdominal: Soft. She exhibits no distension. There is tenderness in the right upper quadrant and right lower quadrant.  Mild tenderness diffusely but more prominent tenderness in RLQ > RUQ  Neurological: She is alert and  oriented to person, place, and time.  Skin: Skin is warm and dry.  Nursing note and vitals reviewed.    ED Treatments / Results  Labs (all labs ordered are listed, but only abnormal results are displayed) Labs Reviewed  COMPREHENSIVE METABOLIC PANEL - Abnormal; Notable for the following components:      Result Value   Potassium 3.0 (*)    Glucose, Bld 130 (*)    ALT 13 (*)    GFR calc non Af Amer 56 (*)    All other components within normal limits  CBC WITH DIFFERENTIAL/PLATELET - Abnormal; Notable for the following components:   Hemoglobin 8.9 (*)    HCT 29.6 (*)    MCV 67.7 (*)    MCH 20.4 (*)    RDW 19.0 (*)    Lymphs Abs 4.5 (*)    All other components within normal limits  URINALYSIS, ROUTINE W REFLEX MICROSCOPIC - Abnormal; Notable for the following components:   Color, Urine COLORLESS (*)    Specific Gravity, Urine 1.002 (*)    All other components within normal limits  LIPASE, BLOOD    EKG  EKG Interpretation  Date/Time:  Monday June 24 2017 16:57:32 EDT Ventricular Rate:  72 PR Interval:  208 QRS Duration: 76 QT Interval:  366 QTC Calculation: 400 R Axis:   79 Text Interpretation:  Normal sinus rhythm Left ventricular hypertrophy with repolarization abnormality Abnormal ECG no significant change since May 2018 Confirmed by Sherwood Gambler [phone]) on 06/24/2017 5:06:20 PM       Radiology Ct Abdomen Pelvis W Contrast  Result Date: 06/24/2017 CLINICAL DATA:  72 year old female with abdominal pain. EXAM: CT ABDOMEN AND PELVIS WITH CONTRAST TECHNIQUE: Multidetector CT imaging of the abdomen and pelvis was performed using the standard protocol following bolus administration of intravenous contrast. CONTRAST:  188m ISOVUE-300 IOPAMIDOL (ISOVUE-300) INJECTION 61% COMPARISON:  Abdominal CT dated 05/29/2017 FINDINGS: Lower chest: The visualized lung bases are clear. There is coronary vascular calcification. No intra-abdominal free air or free fluid. Hepatobiliary: No  focal liver abnormality is seen. No gallstones, gallbladder wall thickening, or biliary dilatation. Pancreas: Unremarkable. No pancreatic ductal dilatation or surrounding inflammatory changes. Spleen: Normal in size without focal abnormality. Adrenals/Urinary Tract: The adrenal glands are unremarkable. The kidneys, visualized ureters, and urinary bladder appear unremarkable as well. Stomach/Bowel: There is no bowel obstruction or active inflammation. There is sigmoid diverticulosis without active inflammation. The appendix is normal. Vascular/Lymphatic: There is advanced aortoiliac atherosclerotic disease. No portal venous gas. There is no adenopathy. Reproductive: Hysterectomy.  No pelvic mass. Other: None Musculoskeletal: There is degenerative changes of the spine with multilevel disc desiccation and vacuum phenomena. No acute osseous pathology. IMPRESSION: 1. No acute intra-abdominal or pelvic pathology. 2. Sigmoid diverticulosis. No bowel obstruction or active inflammation. Normal appendix. 3.  Aortic Atherosclerosis (ICD10-I70.0). Electronically Signed   By: Anner Crete M.D.   On: 06/24/2017 19:02    Procedures Procedures (including critical care time)  Medications Ordered in ED Medications  fentaNYL (SUBLIMAZE) injection 50 mcg (50 mcg Intravenous Given 06/24/17 1749)  sodium chloride 0.9 % bolus 1,000 mL (0 mLs Intravenous Stopped 06/24/17 1833)  iopamidol (ISOVUE-300) 61 % injection (100 mLs Intravenous Contrast Given 06/24/17 1841)  potassium chloride SA (K-DUR,KLOR-CON) CR tablet 40 mEq (40 mEq Oral Given 06/24/17 1954)     Initial Impression / Assessment and Plan / ED Course  I have reviewed the triage vital signs and the nursing notes.  Pertinent labs & imaging results that were available during my care of the patient were reviewed by me and considered in my medical decision making (see chart for details).     No clear emergent pathology that would be an obvious cause of her  abdominal pain.  She has chronic anemia and it is slightly worse today but not significant.  She has mild hypokalemia which will be repleted.  After fentanyl her pain is better and her pain is even more improved after she went to the bathroom and had flatus multiple times.  She is afebrile and does not have any chest symptoms.  Urinalysis benign.  This could be related to constipation or gas.  There is no obvious gallbladder pathology on CT or appendicitis.  No obstruction.  Given she is feeling much better and repeat abdominal exam shows minimal to no tenderness, I think she is stable for discharge home.  Discussed constipation/bowel care as well as treatment for potassium.  Return precautions.  Final Clinical Impressions(s) / ED Diagnoses   Final diagnoses:  Right sided abdominal pain  Hypokalemia    ED Discharge Orders        Ordered    polyethylene glycol (MIRALAX / GLYCOLAX) packet  Daily     06/24/17 1953    docusate sodium (COLACE) 100 MG capsule  Every 12 hours     06/24/17 1953    potassium chloride SA (K-DUR,KLOR-CON) 20 MEQ tablet  Daily     06/24/17 1953       Sherwood Gambler, MD 06/24/17 2017

## 2017-06-24 NOTE — ED Notes (Signed)
Pt ambulates to bathroom with steady gait

## 2017-06-24 NOTE — ED Notes (Signed)
ED Provider at bedside. 

## 2017-06-24 NOTE — ED Triage Notes (Signed)
Pt arrives EMS from home with c/o abdominal pain and distention since about 11 am today after eating. Pt denies N/V/D.

## 2017-06-24 NOTE — ED Notes (Signed)
Patient transported to CT 

## 2017-07-03 NOTE — Progress Notes (Signed)
[facility] Phone: [phone]   Date of Visit: 07/04/2017   HPI:  Right groin boil: -Patient has recently had recurrent boils on her right groin.  She initially presented in January 2019 with for boils in this region.  At that time there were spontaneously draining and did not have any signs of cellulitis overlying the area.  She was seen again at the end of January and at that time the majority of her boils have resolved but had one we will that was still draining.  At that exam there is no cellulitis that was noted overlying the area.  Her right lower quadrant abdominal pain was less likely due to the abscess in the groin.  We also did a pelvic exam at that time which was unremarkable.  We obtained a CBC with differential and ESR with a plan to obtain a CT abdomen pelvis if these lab values were abnormal; however her CBC and ESR were unremarkable. -She presents today and reports that her boils fully resolved from January but another right groin little.  Last week and started draining about 2 days ago.  Reports of severe pain in her abdomen when it is draining.  -She was seen in the ED on March 11 for her abdominal pain.  At that time for repeat CT abdomen and pelvis was done which showed no acute processes that could be explained by her.   - She reports that she has a history of recurrent boils but they are mainly under her axilla    Abdominal pain: -Patient has had chronic lower abdominal pain that she reports for the past 5 years or so.  She does have a history of internal hemorrhoids with a history of bleeding in December 2017.  She has been seen by gastroenterology for her symptoms with the most recent visit on June 2018.  Per note, GI thought that her "abdominal pain is part of a global chronic pain syndrome. [She] has had an extensive and unrevealing workup over the years, including recent CT scan.  -Patient reports that her abdominal pain is constant  stabbing/cutting pain.  She has soft bowel movement daily.  Having a bowel movement does not help with her pain.  Denies any blood in her stool or dark tarry stools.   She denies any dysuria or urinary frequency.  She has had a partial hysterectomy in 1971. - She denies any fevers or chills.  Reports of some decreased appetite.  No vomiting  -She is frustrated no one can figure out why she is having this abdominal pain despite having multiple imaging studies. -It looks like her last colonoscopy was in 2015.  Due to about 7 sessile polyps that were found it was recommended that she have a repeat colonoscopy in 3 years.  Will remind patient to make this appointment.   -Patient denies having any history of H. Pylori -She is currently taking Protonix every day.  She is also taking MiraLAX every other day to keep her bowels soft   ROS: See HPI.  [place]:  PMH: HTN Asthma GERD with history of esophageal stricture  Diverticulosis of Colon with hx of rectal bleeding.  DM2 Osteoporosis OA  PHYSICAL EXAM: BP (!) 148/90   Pulse 73   Temp 98 F (36.7 C) (Oral)   Ht 5' 3" (1.6 m)   Wt 146 lb 9.6 oz (66.5 kg)   SpO2 99%   BMI 25.97 kg/m  GEN: Intermittently tearful because of her frustration  and her pain CV: RRR, no murmurs, rubs, or gallops PULM: CTAB, normal effort ABD: Soft,nondistended,, there is tenderness in the lower abdomen without guarding or rebound tenderness mainly in the epigastric region without guarding or rebound. NABS, no organomegaly Right groin: There is a abscess that is about 1 cm in diameter that has already drained and does not have any fluctuance or any signs of surrounding cellulitis.  It is very superficial.  She reports some tenderness to palpation of this area. SKIN: No rash or cyanosis; warm and well-perfused EXTR: No lower extremity edema or calf tenderness PSYCH: Patient is frustrated because of her pain.  normal rate and volume of speech NEURO: Awake, alert, no  focal deficits grossly, normal speech   ASSESSMENT/PLAN:  Right groin abscess: Very small abscess about 1 cm in diameter that is already draining.  There is no overlying cellulitis.  No antibiotics are indicated for this.  Recommended warm compresses.  Chronic abdominal pain: Her tenderness is mainly in the epigastric region today although she does have some tenderness in the lower abdomen as well.  There is no guarding or rebound and she has normal bowel sounds.  She has been seen by GI was unable to find any particular cause for her symptoms despite thorough workup over the past  Years.  We will test for H. pylori today.  We will also get some basic labs for reasons noted below.  Her H. pylori test returned positive.  I have already called the patient to inform her of this and started Protonix 40 mg twice daily for 14 days, clarithromycin 500 mg twice daily for 14 days, and Flagyl 500 mg 3 times daily for 14 days (she has allergy to penicillins). she is past due for her screening colonoscopy per chart review.  Does not have the classic symptoms of irritable bowel syndrome,.  But we will do a trial of Bentyl.  Chronic microcytic anemia: Her last CBC on March 11 showed a slowly downtrending hemoglobin to 8.9 from 9.5 in January.  No iron studies on file.  Will repeat CBC as well as iron panel.  As mentioned above she needs to get her screening colonoscopy.  With her positive H. pylori test she might also benefit from an upper endoscopy.  Will inform her of this.  We will also send a message to her GI to inform.  Hypokalemia: Was noted on ED lab work earlier in March.  Will obtain BMP as well as magnesium to further evaluate.  Precepted  Smiley Houseman, MD PGY [place]

## 2017-07-04 ENCOUNTER — Encounter: Payer: Self-pay | Admitting: Internal Medicine

## 2017-07-04 ENCOUNTER — Telehealth: Payer: Self-pay | Admitting: Internal Medicine

## 2017-07-04 ENCOUNTER — Ambulatory Visit (INDEPENDENT_AMBULATORY_CARE_PROVIDER_SITE_OTHER): Payer: Medicare Other | Admitting: Internal Medicine

## 2017-07-04 ENCOUNTER — Other Ambulatory Visit: Payer: Self-pay

## 2017-07-04 VITALS — BP 148/90 | HR 73 | Temp 98.0°F | Ht 63.0 in | Wt 146.6 lb

## 2017-07-04 DIAGNOSIS — A048 Other specified bacterial intestinal infections: Secondary | ICD-10-CM | POA: Diagnosis not present

## 2017-07-04 DIAGNOSIS — L02214 Cutaneous abscess of groin: Secondary | ICD-10-CM

## 2017-07-04 DIAGNOSIS — E876 Hypokalemia: Secondary | ICD-10-CM

## 2017-07-04 DIAGNOSIS — G8929 Other chronic pain: Secondary | ICD-10-CM | POA: Diagnosis not present

## 2017-07-04 DIAGNOSIS — R109 Unspecified abdominal pain: Secondary | ICD-10-CM

## 2017-07-04 DIAGNOSIS — D649 Anemia, unspecified: Secondary | ICD-10-CM

## 2017-07-04 LAB — POCT H PYLORI SCREEN: H Pylori Screen, POC: POSITIVE

## 2017-07-04 MED ORDER — DICYCLOMINE HCL 20 MG PO TABS: 20.0000 mg | ORAL_TABLET | Freq: Four times a day (QID) | ORAL | 0 refills | Status: DC

## 2017-07-04 MED ORDER — METRONIDAZOLE 500 MG PO TABS: 500.0000 mg | ORAL_TABLET | Freq: Three times a day (TID) | ORAL | 0 refills | Status: DC

## 2017-07-04 MED ORDER — CLARITHROMYCIN 500 MG PO TABS: 500.0000 mg | ORAL_TABLET | Freq: Two times a day (BID) | ORAL | 0 refills | Status: DC

## 2017-07-04 MED ORDER — PANTOPRAZOLE SODIUM 40 MG PO TBEC: 40.0000 mg | DELAYED_RELEASE_TABLET | Freq: Two times a day (BID) | ORAL | 0 refills | Status: DC

## 2017-07-04 NOTE — Patient Instructions (Signed)
Let's try Bentyl for your stomach pain.   We will get blood work today   Antibiotic is not indicated currently for your boil. Follow up in 2 weeks.

## 2017-07-04 NOTE — Telephone Encounter (Signed)
Called patient to report of positive h pylori testing. She has not had h pylori in the past. Will treat with triple therapy: Protonix 40mg  BID, Clarithromycin 500mg  BID, and Flagyl 500mg  TID (PCN allergy) for 14 days.

## 2017-07-05 ENCOUNTER — Telehealth: Payer: Self-pay | Admitting: Internal Medicine

## 2017-07-05 ENCOUNTER — Encounter: Payer: Self-pay | Admitting: Internal Medicine

## 2017-07-05 DIAGNOSIS — Z1211 Encounter for screening for malignant neoplasm of colon: Secondary | ICD-10-CM

## 2017-07-05 LAB — IRON AND TIBC
Iron Saturation: 5 % — CL (ref 15–55)
Iron: 24 ug/dL — ABNORMAL LOW (ref 27–139)
Total Iron Binding Capacity: 466 ug/dL — ABNORMAL HIGH (ref 250–450)
UIBC: 442 ug/dL — ABNORMAL HIGH (ref 118–369)

## 2017-07-05 LAB — BASIC METABOLIC PANEL
BUN/Creatinine Ratio: 13 (ref 12–28)
BUN: 11 mg/dL (ref 8–27)
CO2: 20 mmol/L (ref 20–29)
Calcium: 9.7 mg/dL (ref 8.7–10.3)
Chloride: 104 mmol/L (ref 96–106)
Creatinine, Ser: 0.87 mg/dL (ref 0.57–1.00)
GFR calc Af Amer: 77 mL/min/{1.73_m2} (ref >59)
GFR calc non Af Amer: 67 mL/min/{1.73_m2} (ref >59)
Glucose: 100 mg/dL — ABNORMAL HIGH (ref 65–99)
Potassium: 4.3 mmol/L (ref 3.5–5.2)
Sodium: 141 mmol/L (ref 134–144)

## 2017-07-05 LAB — CBC
Hematocrit: 32.7 % — ABNORMAL LOW (ref 34.0–46.6)
Hemoglobin: 9.9 g/dL — ABNORMAL LOW (ref 11.1–15.9)
MCH: 20.2 pg — ABNORMAL LOW (ref 26.6–33.0)
MCHC: 30.3 g/dL — ABNORMAL LOW (ref 31.5–35.7)
MCV: 67 fL — ABNORMAL LOW (ref 79–97)
Platelets: 393 10*3/uL — ABNORMAL HIGH (ref 150–379)
RBC: 4.89 x10E6/uL (ref 3.77–5.28)
RDW: 19.4 % — ABNORMAL HIGH (ref 12.3–15.4)
WBC: 9 10*3/uL (ref 3.4–10.8)

## 2017-07-05 LAB — MAGNESIUM: Magnesium: 2.2 mg/dL (ref 1.6–2.3)

## 2017-07-05 LAB — FERRITIN: Ferritin: 11 ng/mL — ABNORMAL LOW (ref 15–150)

## 2017-07-05 MED ORDER — FERROUS SULFATE 325 (65 FE) MG PO TABS: 325.0000 mg | ORAL_TABLET | Freq: Two times a day (BID) | ORAL | 3 refills | Status: DC

## 2017-07-05 NOTE — Telephone Encounter (Signed)
Spoke with Margaret Rangel today to discuss her lab results.  Her kidney function electrolytes are normal.  She has iron deficiency anemia for which I recommended starting ferrous sulfate 325 mg twice daily.  Discussed that this can cause constipation and that she can use MiraLAX that she has at home for constipation.  She also reports to me that Bentyl has significantly improved her abdominal pain.  I am glad she is getting some relief.  I reminded her that she is due for her colonoscopy.  I made a referral to GI for this.  She goes to [place]

## 2017-07-08 ENCOUNTER — Other Ambulatory Visit: Payer: Self-pay | Admitting: Internal Medicine

## 2017-07-08 ENCOUNTER — Telehealth: Payer: Self-pay | Admitting: Internal Medicine

## 2017-07-08 DIAGNOSIS — Z1239 Encounter for other screening for malignant neoplasm of breast: Secondary | ICD-10-CM

## 2017-07-08 DIAGNOSIS — Z1231 Encounter for screening mammogram for malignant neoplasm of breast: Secondary | ICD-10-CM

## 2017-07-08 NOTE — Telephone Encounter (Signed)
Pt said she called [facility] to make an appt for her mammogram and they told her that Dallas Schimke would have to call them and verify she is alright to get the mammogram. Pt would like Gunadasa to also give her a call to discuss her meds because she said her new meds just make her sick on her stomach all the time. Please advise

## 2017-07-08 NOTE — Telephone Encounter (Signed)
Will forward to MD to advise. Giliana Vantil,CMA  

## 2017-07-08 NOTE — Telephone Encounter (Signed)
Reviewed the note attached to the screening mammogram. It looks like the patient reported of left breast soreness, therefore, [facility] requested diagnostic mammogram with left breast US. This was ordered. I informed pt who can now call them back to schedule.   She reports that initially the medication helped but now the abdominal pain is back. She started all the medications on the same day. I actually ordered the IgG for H.Pylori instead of urea breath test.

## 2017-07-09 DIAGNOSIS — M5432 Sciatica, left side: Secondary | ICD-10-CM | POA: Diagnosis not present

## 2017-07-09 DIAGNOSIS — M5431 Sciatica, right side: Secondary | ICD-10-CM | POA: Diagnosis not present

## 2017-07-10 ENCOUNTER — Telehealth: Payer: Self-pay

## 2017-07-10 NOTE — Telephone Encounter (Signed)
Left message on nurse line requesting to speak with Dr. Erin Hearing. Called patient back, she states she is wanting to speak with Dr. Erin Hearing regarding concerns she has about her current medical care and concerns she has regarding all the medications she has been on, and referrals that have been made. Advised patient I would forward message to Dr. Erin Hearing. Wallace Cullens, RN

## 2017-07-12 NOTE — Telephone Encounter (Signed)
Patient wanted to speak to me as a Child psychotherapist.  She likes Dr Darnell Level and thinks she is a good Dr but would like another opinion because her pain is so bad.  I briefly reviewed Dr Jackelyn Hoehn note and went over it with Ms Coventry She confirmed she has had the pain for years but is getting worse.  She has an appointment to see Dr Darnell Level on Tuesday  I related I thought Dr Darnell Level was an excellent doctor and on brief review that I could not see anything different I would do but that I would talk with Dr Darnell Level before her next visit  She amiably agreed.

## 2017-07-15 NOTE — Progress Notes (Signed)
   [facility] Phone: [phone]   Date of Visit: 07/16/2017   HPI:  Chronic Abdominal Pain:  - reports this is mainly in the left lower quadrant. It radiates down her leg and to her upper abdomen  - she has had multiple imaging studies and blood work for evaluation which were unremarkable.  - she has been seen by GI in the past. Most recent as 09/2016 for this.  - she reports yesterday and today, she her hemerrhoids started acting up. She noticed blood with wiping. Blood does not drip in the bowl. She has been using preparation H which has been helping some with this. She declines rectal exam today.  - at the last visit we started Bentyl which only helps a little she reports - at that visit, we also tested for H pylori but IgG was tested, not urea breath test. Therefore the next day I called her asked her to stop taking Protonix, Clarithromycin and Flagyl, and that we will do urea breath test two weeks after stopping her PPI.  - At the last visit she was found to have significant iron deficiency anemia.   ROS: See HPI.  [place]:  PMH: HTN Asthma Diverticulosis of Colon DM2 Hemorrhoids with history of rectal bleeding  Osteoporosis  GAD Hx of Stroke  PHYSICAL EXAM: BP 124/60   Pulse 74   Temp 98.1 F (36.7 C) (Oral)   Ht 5\' 3"  (1.6 m)   Wt 149 lb (67.6 kg)   SpO2 99%   BMI 26.39 kg/m  GEN: tearful at times, nontoxic appearing  HEENT: Atraumatic, normocephalic, neck supple, EOMI, sclera clear  CV: RRR, no murmurs, rubs, or gallops PULM: CTAB, normal effort ABD: Soft, tenderness to palpation in the left abdomen without guarding or rebound, nondistended, NABS, no organomegaly SKIN: No rash or cyanosis; warm and well-perfused EXTR: No lower extremity edema or calf tenderness PSYCH: Mood and affect euthymic, normal rate and volume of speech NEURO: Awake, alert, no focal deficits grossly, normal speech Declined rectal exam    ASSESSMENT/PLAN:  Abdominal pain, left lower quadrant: Chronic Patient's work up has bee unremarkable thus far. She has been seen by GI in the past for this. She is overdue for repeat colonoscopy, therefore will refer her for this. Patient would like a refill of Bentyl. We will also try Cymbalta for her chronic pain.  - Ambulatory referral to Gastroenterology - dicyclomine (BENTYL) 20 MG tablet; Take 1 tablet (20 mg total) by mouth 3 (three) times daily as needed for spasms.  Dispense: 30 tablet; Refill: 0  Anemia, unspecified type: rectal bleeding with wiping only. PCT hgb is overall stable. Will get CBC to confirm Asked her to start taking Ferrous sulfate. (also take bowel regimen to avoid constipation) - POCT hemoglobin - CBC with Differential  Precepted   Smiley Houseman, MD PGY [place]

## 2017-07-16 ENCOUNTER — Encounter: Payer: Self-pay | Admitting: Internal Medicine

## 2017-07-16 ENCOUNTER — Other Ambulatory Visit: Payer: Self-pay

## 2017-07-16 ENCOUNTER — Ambulatory Visit (INDEPENDENT_AMBULATORY_CARE_PROVIDER_SITE_OTHER): Payer: Medicare Other | Admitting: Internal Medicine

## 2017-07-16 VITALS — BP 124/60 | HR 74 | Temp 98.1°F | Ht 63.0 in | Wt 149.0 lb

## 2017-07-16 DIAGNOSIS — Z1211 Encounter for screening for malignant neoplasm of colon: Secondary | ICD-10-CM

## 2017-07-16 DIAGNOSIS — R1032 Left lower quadrant pain: Secondary | ICD-10-CM | POA: Diagnosis not present

## 2017-07-16 DIAGNOSIS — D649 Anemia, unspecified: Secondary | ICD-10-CM | POA: Diagnosis not present

## 2017-07-16 LAB — POCT HEMOGLOBIN: Hemoglobin: 9.1 g/dL — AB (ref 12.2–16.2)

## 2017-07-16 MED ORDER — DICYCLOMINE HCL 20 MG PO TABS: 20.0000 mg | ORAL_TABLET | Freq: Three times a day (TID) | ORAL | 0 refills | Status: DC | PRN

## 2017-07-16 MED ORDER — DULOXETINE HCL 30 MG PO CPEP: 30.0000 mg | ORAL_CAPSULE | Freq: Every day | ORAL | 0 refills | Status: DC

## 2017-07-16 NOTE — Patient Instructions (Addendum)
We will refer to GI for your symptoms and a colonoscopy  Please start taking your iron pills with colace (stool softner)  We will try Cymbalta daily to help with your pain  Continue Bentyl as needed

## 2017-07-17 ENCOUNTER — Encounter: Payer: Self-pay | Admitting: Gastroenterology

## 2017-07-17 LAB — CBC WITH DIFFERENTIAL/PLATELET
Basophils Absolute: 0 10*3/uL (ref 0.0–0.2)
Basos: 1 %
EOS (ABSOLUTE): 0.6 10*3/uL — ABNORMAL HIGH (ref 0.0–0.4)
Eos: 7 %
Hematocrit: 32.6 % — ABNORMAL LOW (ref 34.0–46.6)
Hemoglobin: 9.9 g/dL — ABNORMAL LOW (ref 11.1–15.9)
Immature Grans (Abs): 0.1 10*3/uL (ref 0.0–0.1)
Immature Granulocytes: 1 %
Lymphocytes Absolute: 3.7 10*3/uL — ABNORMAL HIGH (ref 0.7–3.1)
Lymphs: 42 %
MCH: 20.8 pg — ABNORMAL LOW (ref 26.6–33.0)
MCHC: 30.4 g/dL — ABNORMAL LOW (ref 31.5–35.7)
MCV: 68 fL — ABNORMAL LOW (ref 79–97)
Monocytes Absolute: 0.8 10*3/uL (ref 0.1–0.9)
Monocytes: 10 %
Neutrophils Absolute: 3.4 10*3/uL (ref 1.4–7.0)
Neutrophils: 39 %
Platelets: 339 10*3/uL (ref 150–379)
RBC: 4.77 x10E6/uL (ref 3.77–5.28)
RDW: 20.9 % — ABNORMAL HIGH (ref 12.3–15.4)
WBC: 8.6 10*3/uL (ref 3.4–10.8)

## 2017-07-22 ENCOUNTER — Telehealth: Payer: Self-pay | Admitting: Internal Medicine

## 2017-07-22 ENCOUNTER — Other Ambulatory Visit: Payer: Self-pay | Admitting: Internal Medicine

## 2017-07-22 DIAGNOSIS — N644 Mastodynia: Secondary | ICD-10-CM

## 2017-07-22 NOTE — Telephone Encounter (Signed)
Pt called and wants her doctor to contact her ASAP. She is in a lot of pain and wants to discuss complications with her medications.

## 2017-07-23 MED ORDER — TRAMADOL HCL 50 MG PO TABS: 50.0000 mg | ORAL_TABLET | Freq: Every day | ORAL | 0 refills | Status: DC | PRN

## 2017-07-23 NOTE — Telephone Encounter (Signed)
Called patient.  She continues to have pain. Reiterated that we need her to be seen by GI for further evaluation. Received a letter stating that her appointment is at the end of next month. I recommended that she call and ask if there are appointments available sooner or if she would be able to get on the wait list for canceled appointment.  For her pain, we are using Bentyl PRN, Cymbalta, and Ibuprofen. Patient reports she cannot take Tylenol because it makes her nauseous.? Will send Tramadol 50mg  to take daily PRN for severe pain.

## 2017-07-24 ENCOUNTER — Ambulatory Visit
Admission: RE | Admit: 2017-07-24 | Discharge: 2017-07-24 | Disposition: A | Discharge status: Home / Self Care | Payer: Medicare Other | Source: Ambulatory Visit | Attending: Family Medicine | Admitting: Family Medicine

## 2017-07-24 ENCOUNTER — Ambulatory Visit: Payer: Medicare Other

## 2017-07-24 DIAGNOSIS — R928 Other abnormal and inconclusive findings on diagnostic imaging of breast: Secondary | ICD-10-CM | POA: Diagnosis not present

## 2017-07-24 DIAGNOSIS — N644 Mastodynia: Secondary | ICD-10-CM

## 2017-08-09 ENCOUNTER — Ambulatory Visit (INDEPENDENT_AMBULATORY_CARE_PROVIDER_SITE_OTHER): Payer: Medicare Other | Admitting: Physician Assistant

## 2017-08-09 ENCOUNTER — Other Ambulatory Visit: Payer: Self-pay | Admitting: Cardiology

## 2017-08-09 ENCOUNTER — Encounter: Payer: Self-pay | Admitting: Physician Assistant

## 2017-08-09 VITALS — BP 138/62 | HR 78 | Ht 62.0 in | Wt 143.0 lb

## 2017-08-09 DIAGNOSIS — R1032 Left lower quadrant pain: Secondary | ICD-10-CM | POA: Diagnosis not present

## 2017-08-09 DIAGNOSIS — D509 Iron deficiency anemia, unspecified: Secondary | ICD-10-CM

## 2017-08-09 DIAGNOSIS — M5410 Radiculopathy, site unspecified: Secondary | ICD-10-CM

## 2017-08-09 DIAGNOSIS — G8929 Other chronic pain: Secondary | ICD-10-CM | POA: Diagnosis not present

## 2017-08-09 DIAGNOSIS — D369 Benign neoplasm, unspecified site: Secondary | ICD-10-CM | POA: Diagnosis not present

## 2017-08-09 NOTE — Telephone Encounter (Signed)
Shouldn't this be deferred to PCP? Please advise. Thanks, MI

## 2017-08-09 NOTE — Progress Notes (Signed)
Subjective:    Patient ID: Margaret Rangel, female    DOB: 1945-12-23, 72 y.o.   MRN: [id number]  HPI Margaret Rangel is a pleasant 72 year old African-American female, known to Dr. Loletha Carrow who was last seen in our office about 1 year ago, and is referred back today by [facility]/Dr. Dallas Schimke for evaluation of chronic abdominal pain and new finding of iron deficiency anemia. Patient has history of adult onset diabetes mellitus, chronic GERD, asthma, hypertension and degenerative disc disease with chronic back pain. She last had colonoscopy in September 2015 per Dr. Deatra Ina and had 7 polyps removed at that time the largest was 12 mm in the left colon, also noted to have moderate diverticulosis and internal hemorrhoids.  She was advised to have 3-year interval follow-up.  Biopsies showed a sessile serrated polyp and tubular adenomas.  EGD in 2014 with enlarged gastric folds, biopsies were done showing reactive changes no H. pylori and she was [facility] dilated for an esophageal stricture. Patient has had chronic complaints of abdominal pain over the past couple of years.  When she was initially referred back last year it was Dr. Loletha Carrow' is opinion that her complaint of left-sided abdominal pain which is radiating down into her leg was  not of GI etiology and sounded radicular in nature. Patient says she is continuing to have that same pain which is been horrible and progressive.  She says she has pain all the time that radiates now down into the left anterior thigh down to the knee.  She describes it as sharp and burning, and knifelike in nature.  She has a hard time sleeping or rolling over in bed due to this pain.  She also gets pain in the left lower quadrant that radiates around into her back.  She came in in a wheelchair today because she is having difficulty walking due to this pain. Appetite has been fair, her weight is down about 3 pounds.  She generally eats small amounts frequently.  She is  taking MiraLAX daily and as needed mineral oil and says she is having bowel movements on a daily basis.  She has not seen any melena or hematochezia. She was just started on ferrous sulfate earlier this week.  She says she has had prior history of anemia remotely. Viewing her labs she is actually had a chronic anemia hemoglobin of 9.9 back in 2017, in May 2018 hemoglobin was 10.2 MCV was 71. Most recent labs 07/16/2017 hemoglobin 9.9 hematocrit of 32.6 MCV of 68, ferritin 11 serum iron of 24 TIBC of 466.  She has had 2 CTs of the abdomen this year, the last done in March when she was in the emergency room did not show any acute intra-abdominal or pelvic pathology she is noted to have sigmoid diverticulosis no bowel obstruction or active inflammation normal appendix she has aortic atherosclerosis.  CT noted degenerative changes of the spine with multilevel disc desiccation.  MRI of the lumbar spine was done in June 2018 which showed progressive disc degeneration L2-L3 with moderate to severe spinal stenosis and moderate left foraminal narrowing, moderate to severe spinal stenosis L3-L4 with severe left foraminal encroachment and moderate right foraminal encroachment, there is mild stenosis at L4-L5 and compression of the right L4 nerve root all progressive since previous MRI.  Patient states that narcotic pain medications do help her pain she feels she needs something for pain but does not want to get addicted.   Review of Systems Pertinent positive  and negative review of systems were noted in the above HPI section.  All other review of systems was otherwise negative.  Outpatient Encounter Medications as of 08/09/2017  Medication Sig  . albuterol (PROVENTIL HFA;VENTOLIN HFA) 108 (90 Base) MCG/ACT inhaler Inhale 2 puffs into the lungs every 6 (six) hours as needed for wheezing or shortness of breath.  Marland Kitchen aspirin (EQ ASPIRIN ADULT LOW DOSE) 81 MG EC tablet Take 1 tablet (81 mg total) by mouth daily.  (Patient not taking: Reported on 05/13/2017)  . benzonatate (TESSALON PERLES) 100 MG capsule Take 1 capsule (100 mg total) by mouth 3 (three) times daily as needed for cough.  . Blood Glucose Calibration (ONETOUCH VERIO) High SOLN 1 each by In Vitro route as directed.  . Blood Glucose Calibration (ONETOUCH VERIO) SOLN 1 Bottle by In Vitro route as directed.  . Blood Glucose Monitoring Suppl (ONETOUCH VERIO) w/Device KIT 1 kit by Does not apply route 3 (three) times daily. ICD-10 code: E11.9  . busPIRone (BUSPAR) 5 MG tablet Take 1 tablet (5 mg total) by mouth 2 (two) times daily. (Patient taking differently: Take 5 mg by mouth daily. )  . Calcium Carb-Cholecalciferol 600-800 MG-UNIT TABS Take one tablet daily  . carvedilol (COREG) 3.125 MG tablet Take 1 tablet (3.125 mg total) by mouth 2 (two) times daily.  Marland Kitchen dicyclomine (BENTYL) 20 MG tablet Take 1 tablet (20 mg total) by mouth 3 (three) times daily as needed for spasms.  Marland Kitchen docusate sodium (COLACE) 100 MG capsule Take 1 capsule (100 mg total) by mouth every 12 (twelve) hours.  . DULoxetine (CYMBALTA) 30 MG capsule Take 1 capsule (30 mg total) by mouth daily.  . fexofenadine (ALLEGRA ALLERGY) 60 MG tablet Take 1 tablet (60 mg total) by mouth 2 (two) times daily.  Marland Kitchen glimepiride (AMARYL) 2 MG tablet TAKE 1 TABLET BY MOUTH ONCE DAILY WITH  BREAKFAST  . glucose blood (ONETOUCH VERIO) test strip Use as instructed to test glucose level three times daily. ICD-10 code: E11.9  . Lancet Devices (ONE TOUCH DELICA LANCING DEV) MISC 1 Device by Does not apply route as directed. ICD-10 code: E11.9  . ONETOUCH DELICA LANCETS 94W MISC 1 each by Does not apply route 3 (three) times daily. ICD-10 code: E11.9  . pantoprazole (PROTONIX) 40 MG tablet Take 1 tablet (40 mg total) by mouth 2 (two) times daily for 14 days.  . polyethylene glycol (MIRALAX / GLYCOLAX) packet Take 17 g by mouth daily.  . rosuvastatin (CRESTOR) 40 MG tablet Take 1 tablet (40 mg total) by mouth  daily. Please make overdue yearly appt with Dr. Curt Bears. 2nd attempt  . traMADol (ULTRAM) 50 MG tablet Take 1 tablet (50 mg total) by mouth daily as needed.   No facility-administered encounter medications on file as of 08/09/2017.    Allergies  Allergen Reactions  . Codeine Itching  . Penicillins Itching    Has patient had a PCN reaction causing immediate rash, facial/tongue/throat swelling, SOB or lightheadedness with hypotension:No Has patient had a PCN reaction causing severe rash involving mucus membranes or skin necrosis:No Has patient had a PCN reaction that required hopititalization:No Has patient had a PCN reaction occurring within the last 10 years:No If all of the above answers are "NO", then may proceed with Cephalosporin use.    Patient Active Problem List   Diagnosis Date Noted  . Abscess of female genitalia 05/03/2017  . GAD (generalized anxiety disorder) 11/22/2016  . Seasonal allergies 11/22/2016  . Asthma, chronic 11/22/2016  .  History of stroke 09/13/2016  . Diverticulosis of colon 09/13/2016  . Osteoarthritis of right hip 05/29/2016  . Osteoporosis 05/01/2016  . Systolic murmur 95/18/8416  . Essential hypertension 04/02/2016  . Chest pain with moderate risk for cardiac etiology 03/07/2016  . History of esophageal stricture 03/01/2016  . Rectal bleeding 02/02/2014  . Esophageal reflux 02/02/2014  . Personal history of colonic polyps 12/09/2013  . Dysphagia, unspecified(787.20) 09/11/2012  . Nausea alone 09/11/2012  . Diabetes mellitus without mention of complication 60/63/0160   Social History   Socioeconomic History  . Marital status: Single    Spouse name: Not on file  . Number of children: 3  . Years of education: Not on file  . Highest education level: Not on file  Occupational History  . Occupation: UNEMPLOYED    Employer: UNEMPLOYED  Social Needs  . Financial resource strain: Not on file  . Food insecurity:    Worry: Not on file     Inability: Not on file  . Transportation needs:    Medical: Not on file    Non-medical: Not on file  Tobacco Use  . Smoking status: Former Smoker    Packs/day: 0.25    Years: 39.00    Pack years: 9.75    Types: Cigarettes    Start date: 04/16/1976    Last attempt to quit: 02/13/2016    Years since quitting: 1.4  . Smokeless tobacco: Never Used  . Tobacco comment: 4-5 cigs per day for 39 years  Substance and Sexual Activity  . Alcohol use: Yes    Alcohol/week: 12.6 oz    Types: 21 Cans of beer per week  . Drug use: Yes    Types: Marijuana    Comment: using for depression  . Sexual activity: Not on file  Lifestyle  . Physical activity:    Days per week: Not on file    Minutes per session: Not on file  . Stress: Not on file  Relationships  . Social connections:    Talks on phone: Not on file    Gets together: Not on file    Attends religious service: Not on file    Active member of club or organization: Not on file    Attends meetings of clubs or organizations: Not on file    Relationship status: Not on file  . Intimate partner violence:    Fear of current or ex partner: Not on file    Emotionally abused: Not on file    Physically abused: Not on file    Forced sexual activity: Not on file  Other Topics Concern  . Not on file  Social History Narrative  . Not on file    Ms. Vahey's family history includes Anuerysm in her father; Colon cancer in her brother; Heart disease in her mother; Lung cancer in her brother; Prostate cancer in her brother.      Objective:    Vitals:   08/09/17 1000  BP: 138/62  Pulse: 78    Physical Exam; well-developed elderly African-American female in a wheelchair, pleasant blood pressure 138/62 pulse 78, height 5 foot 2, weight 143, BMI 26.1.  HEENT; nontraumatic normocephalic EOMI PERRLA, sclera anicteric, Cardiovascular ;regular rate and rhythm with S1-S2, Pulmonary; clear bilaterally, Abdomen ;soft, nondistended bowel sounds are active  no palpable mass or hepatosplenomegaly she is tender in the left lower quadrant and into the right inguinal area, rectal; exam not done, extremities no clubbing cyanosis or edema skin warm and dry, Neuro psych;  mood and affect appropriate       Assessment & Plan:   #51 72 year old African-American female with chronic complaints of left lower quadrant abdominal pain radiating down into the left anterior thigh to the knee, progressive over the past year and a half. This pain is not of GI etiology. She has severe L2 level degenerative disc disease of the lumbar spine, spinal stenosis and levels of left foraminal narrowing.  Her pain is consistent with a neuropathic pain/radicular pain as a result of her severe degenerative disc disease  #2 history of adenomatous colon polyps and serrated adenomatous polyp-last colonoscopy September 2015.  Due for follow-up  #3 history of chronic anemia with stable hemoglobin over the past 2 years, a new finding of iron deficiency -iron counts had not been checked previously  #4 adult onset diabetes mellitus 5.  Asthma 6.  Hypertension 7.  Diverticulosis 8.  History of GERD and previous peptic stricture with dilation  Plan; patient does need follow-up colonoscopy, we can consider colonoscopy and EGD for new iron deficiency anemia.  Patient does not feel that she could tolerate either of these procedures at this time due to her severe pain which is limiting her mobility significantly. Patient needs to be referred back to her orthopedist Dr. Luan Pulling.  She says she has had injections in her spine previously, somewhat helpful.  If nothing surgical can be done for her chronic radicular pain-she may need to be referred to a pain specialist for chronic management.  Would also consider starting her on Neurontin for pain control.  She is on Cymbalta currently and says that has not made any difference. I will contact her PCP with above recommendations.   Patient is willing  to have endoscopic evaluation once her pain is under better control.  Plan to follow-up in 3 to 4 months.  Atwood Adcock Genia Harold PA-C 08/09/2017   Cc: Smiley Houseman, MD

## 2017-08-09 NOTE — Telephone Encounter (Signed)
Pt should have filled by PCP. Pt seen one time in 05/2016 for CP w/ PRN follow up. Thanks

## 2017-08-09 NOTE — Patient Instructions (Signed)
Continue Miralax 17 grams in 8 oz of water . Continue the Ferrous Sulfate 325 mg twice daily.  Follow up with Dr. Loletha Carrow 3-4 months to discuss a follow up colonoscopy.  Call us in June, his office schedule should be out through July/August.  If you are age 73 or older, your body mass index should be between 23-30. Your Body mass index is 26.16 kg/m. If this is out of the aforementioned range listed, please consider follow up with your Primary Care Provider.

## 2017-08-12 ENCOUNTER — Telehealth: Payer: Self-pay | Admitting: Internal Medicine

## 2017-08-12 DIAGNOSIS — R1032 Left lower quadrant pain: Secondary | ICD-10-CM

## 2017-08-12 NOTE — Telephone Encounter (Signed)
Pt would like Dr Dallas Schimke to call her and discuss her MRI results. She said she was given different results by another doctor she has seen as well.

## 2017-08-12 NOTE — Progress Notes (Signed)
Thank you for sending this case to me. I have reviewed the entire note, and the outlined plan seems appropriate.  Yes, it still does appear to be lumbar disc disease and spinal stenosis causing the pain, with unrelated anemia.   I agree with endoscopic workup for the IDA when she feels ready to do so.    Wilfrid Lund, MD

## 2017-08-12 NOTE — Telephone Encounter (Signed)
Will forward to MD.  I don't see any recent MRI results for this patient. Elen Acero,CMA

## 2017-08-13 MED ORDER — FERROUS SULFATE 325 (65 FE) MG PO TABS: 325.0000 mg | ORAL_TABLET | Freq: Two times a day (BID) | ORAL | 3 refills | Status: DC

## 2017-08-13 MED ORDER — DICYCLOMINE HCL 20 MG PO TABS: 20.0000 mg | ORAL_TABLET | Freq: Three times a day (TID) | ORAL | 0 refills | Status: DC | PRN

## 2017-08-13 MED ORDER — GABAPENTIN 100 MG PO CAPS: 100.0000 mg | ORAL_CAPSULE | Freq: Every day | ORAL | 0 refills | Status: DC

## 2017-08-13 NOTE — Telephone Encounter (Signed)
Called patient to discuss.  - it looks like GI recommended Gabapentin. Patient would like to try. Rx sent. Discussed side effect of drowsiness.  - she would also like refills of Ferrous sulfate and bentyl. Sent

## 2017-08-22 ENCOUNTER — Other Ambulatory Visit: Payer: Self-pay | Admitting: Internal Medicine

## 2017-08-22 DIAGNOSIS — R1032 Left lower quadrant pain: Secondary | ICD-10-CM

## 2017-09-11 ENCOUNTER — Other Ambulatory Visit: Payer: Self-pay | Admitting: Internal Medicine

## 2017-09-11 DIAGNOSIS — R1032 Left lower quadrant pain: Secondary | ICD-10-CM

## 2017-09-17 DIAGNOSIS — M25552 Pain in left hip: Secondary | ICD-10-CM | POA: Diagnosis not present

## 2017-09-23 NOTE — Progress Notes (Deleted)
   [facility] Phone: [phone]   Date of Visit: 09/25/2017   HPI:  ***  ROS: See HPI.  [place]: ***  PHYSICAL EXAM: There were no vitals taken for this visit. Gen: *** HEENT: *** Heart: *** Lungs: *** Neuro: *** Ext: ***  ASSESSMENT/PLAN:  Health maintenance:  -***  No problem-specific Assessment & Plan notes found for this encounter.  FOLLOW UP: Follow up in *** for ***  Smiley Houseman, MD PGY [place]

## 2017-09-25 ENCOUNTER — Encounter: Payer: Medicare Other | Admitting: Gastroenterology

## 2017-09-25 ENCOUNTER — Ambulatory Visit: Payer: Medicare Other | Admitting: Internal Medicine

## 2017-10-02 DIAGNOSIS — M25552 Pain in left hip: Secondary | ICD-10-CM | POA: Diagnosis not present

## 2017-10-02 DIAGNOSIS — M5416 Radiculopathy, lumbar region: Secondary | ICD-10-CM | POA: Diagnosis not present

## 2017-10-25 ENCOUNTER — Other Ambulatory Visit: Payer: Self-pay | Admitting: Internal Medicine

## 2017-11-07 ENCOUNTER — Ambulatory Visit (INDEPENDENT_AMBULATORY_CARE_PROVIDER_SITE_OTHER): Payer: Medicare Other | Admitting: Family Medicine

## 2017-11-07 ENCOUNTER — Ambulatory Visit (HOSPITAL_COMMUNITY)
Admission: RE | Admit: 2017-11-07 | Discharge: 2017-11-07 | Disposition: A | Discharge status: Home / Self Care | Payer: Medicare Other | Source: Ambulatory Visit | Attending: Family Medicine | Admitting: Family Medicine

## 2017-11-07 ENCOUNTER — Telehealth: Payer: Self-pay | Admitting: *Deleted

## 2017-11-07 ENCOUNTER — Encounter: Payer: Self-pay | Admitting: Family Medicine

## 2017-11-07 ENCOUNTER — Other Ambulatory Visit: Payer: Self-pay

## 2017-11-07 ENCOUNTER — Encounter: Payer: Self-pay | Admitting: Gastroenterology

## 2017-11-07 VITALS — BP 182/78 | HR 84 | Temp 98.1°F | Ht 62.0 in | Wt 145.4 lb

## 2017-11-07 DIAGNOSIS — R079 Chest pain, unspecified: Secondary | ICD-10-CM | POA: Insufficient documentation

## 2017-11-07 NOTE — Telephone Encounter (Signed)
[facility]. She will need a referral before they can see her. Fleeger, Salome Spotted, CMA

## 2017-11-07 NOTE — Patient Instructions (Addendum)
It was great seeing you today! We have addressed the following issues today  1. Call gastroenterology for an EGD and a colonoscopy. [place] office Call Cardiologist (Heart doctor), Will Meredith Leeds, MD    [facility] [address] [place] [address] [phone] (office) [phone] (fax)  If we did any lab work today, and the results require attention, either me or my nurse will get in touch with you. If everything is normal, you will get a letter in mail and a message via . If you don't hear from [place] in two weeks, please give [place] a call. Otherwise, we look forward to seeing you again at your next visit. If you have any questions or concerns before then, please call the clinic at [phone].  Please bring all your medications to every doctors visit  Sign up for My Chart to have easy access to your labs results, and communication with your Primary care physician. Please ask Front Desk for some assistance.   Please check-out at the front desk before leaving the clinic.    Take Care,   Dr. Andy Gauss

## 2017-11-07 NOTE — Telephone Encounter (Signed)
Will place referral to Cardiology today.  Thanks  Marjie Skiff, MD [place], PGY-3

## 2017-11-08 ENCOUNTER — Encounter: Payer: Self-pay | Admitting: Family Medicine

## 2017-11-08 DIAGNOSIS — R079 Chest pain, unspecified: Secondary | ICD-10-CM | POA: Insufficient documentation

## 2017-11-08 NOTE — Assessment & Plan Note (Addendum)
Patient presented with atypical chest pain that she described as sharp lasting 3 minutes around her left breast radiating to her shoulder and arm.  Pain is nonreproducible with palpation therefore less likely to be MSK related.  EKG today in clinic showed T wave inversion in lateral leads consistent with prior tracing.  Patient was evaluated for similar symptoms last year with stress test and echo.  Patient was never able to follow-up with cardiology.  Low suspicion for ACS given lack of shortness of breath dizziness or pain associated with exertion.  Patient has history of GERD which could also be causing her symptoms.  Patient is well-appearing.  EKG is reassuring.  Symptoms are atypical.  Given patient age should t consider shingles as a possible cause of chest pain however no rash was noted on exam.  Will refer to cardiology for further evaluation.  Patient will also call GI to schedule EGD and colonoscopy as previously discussed with GI provider.  She will follow-up in clinic after specialist visits.

## 2017-11-08 NOTE — Progress Notes (Signed)
Subjective:    Patient ID: Margaret Rangel, female    DOB: 01-19-1946, 72 y.o.   MRN: [id number]   CC: Chest pain above and below left breast  HPI: Patient is a 72 year old female who presents today complaining of sharp chest pain above and below her left breast.  Patient reports her symptoms have been going on for the past few weeks.  Patient reports that she gets this sharp shooting pain radiating to her shoulder and left arm down to her elbow.  Patient reports that she has 3-4 episodes a week.  Usually lasts about 3 minutes.  Pain is not related to exertion.  Patient also denies any rash.  Patient denies any pressure, shortness of breath, abdominal pain, nausea, vomiting, dizziness.  Patient has a known history of GERD.  Last stress test was back in 2018 with hyperdynamic LV function and mild AI.  Patient was seen by cardiology but has not been able to follow-up.  Patient is also on reflux medication although she is due for an EGD and a colonoscopy.  Patient is supposed to follow-up with GI.  Smoking status reviewed   ROS: all other systems were reviewed and are negative other than in the HPI   Past Medical History:  Diagnosis Date  . Abdominal pain   . Anal fissure   . Arthritis   . Asthma   . Chronic back pain   . Colon adenoma 2009, 2012   Colonoscopy  . Diverticulosis of colon (without mention of hemorrhage) 2009   Colonoscopy  . DM (diabetes mellitus) ([place])    off medicines for diabetes 12-18-13  . GERD (gastroesophageal reflux disease)   . Hemorrhoids   . Hernia   . HTN (hypertension)   . Hyperlipidemia    low  . Irritable bowel syndrome (IBS)   . Low blood potassium   . Obese   . Stroke ([place])    x2  . Ulcer     Past Surgical History:  Procedure Laterality Date  . COLONOSCOPY    . FLEXIBLE SIGMOIDOSCOPY N/A 07/14/2012   Procedure: FLEXIBLE SIGMOIDOSCOPY;  Surgeon: Inda Castle, MD;  Location: [facility];  Service: Endoscopy;  Laterality: N/A;  . HEMORRHOID  BANDING N/A 07/14/2012   Procedure: HEMORRHOID BANDING;  Surgeon: Inda Castle, MD;  Location: [facility];  Service: Endoscopy;  Laterality: N/A;  . PARTIAL HYSTERECTOMY  1971  . POLYPECTOMY    . TONSILLECTOMY      Past medical history, surgical, family, and social history reviewed and updated in the EMR as appropriate.  Objective:  BP (!) 182/78   Pulse 84   Temp 98.1 F (36.7 C) (Oral)   Ht 5\' 2"  (1.575 m)   Wt 145 lb 6.4 oz (66 kg)   SpO2 97%   BMI 26.59 kg/m   Vitals and nursing note reviewed  General: NAD, pleasant, able to participate in exam Cardiac: RRR, normal heart sounds, no murmurs. 2+ radial and PT pulses bilaterally Respiratory: CTAB, normal effort, No wheezes, rales or rhonchi Abdomen: soft, nontender, nondistended, no hepatic or splenomegaly, +BS Extremities: no edema or cyanosis. WWP. Skin: warm and dry, no rashes noted Neuro: alert and oriented x4, no focal deficits Psych: Normal affect and mood   Assessment & Plan:    Chest pain Patient presented with atypical chest pain that she described as sharp lasting 3 minutes around her left breast radiating to her shoulder and arm.  Pain is nonreproducible with palpation therefore less likely to  be MSK related.  EKG today in clinic showed T wave inversion in lateral leads consistent with prior tracing.  Patient was evaluated for similar symptoms last year with stress test and echo.  Patient was never able to follow-up with cardiology.  Low suspicion for ACS given lack of shortness of breath dizziness or pain associated with exertion.  Patient has history of GERD which could also be causing her symptoms.  Patient is well-appearing.  EKG is reassuring.  Symptoms are atypical.  Given patient age should t consider shingles as a possible cause of chest pain however no rash was noted on exam.  Will refer to cardiology for further evaluation.  Patient will also call GI to schedule EGD and colonoscopy as previously discussed  with GI provider.  She will follow-up in clinic after specialist visits.     Marjie Skiff, MD [facility] PGY-3

## 2017-11-12 ENCOUNTER — Other Ambulatory Visit: Payer: Self-pay | Admitting: Family Medicine

## 2017-11-12 DIAGNOSIS — M5441 Lumbago with sciatica, right side: Principal | ICD-10-CM

## 2017-11-12 DIAGNOSIS — G8929 Other chronic pain: Secondary | ICD-10-CM

## 2017-11-12 DIAGNOSIS — R079 Chest pain, unspecified: Secondary | ICD-10-CM

## 2017-11-12 DIAGNOSIS — R1032 Left lower quadrant pain: Secondary | ICD-10-CM

## 2017-11-12 MED ORDER — DICYCLOMINE HCL 20 MG PO TABS: ORAL_TABLET | ORAL | 0 refills | Status: DC

## 2017-11-12 MED ORDER — TRAMADOL HCL 50 MG PO TABS: 50.0000 mg | ORAL_TABLET | Freq: Every day | ORAL | 0 refills | Status: DC | PRN

## 2017-11-12 NOTE — Telephone Encounter (Signed)
Patient made aware of refills and also the referrals that have been placed. Linzee Depaul,CMA

## 2017-11-12 NOTE — Telephone Encounter (Signed)
Pt would like to have a referral to go back and see her heart doctor. She was also asking for some type of medication called in that would help with her arthritis. Plus she would like a refill on her Dicyclomine called in. jw

## 2017-11-26 ENCOUNTER — Other Ambulatory Visit: Payer: Self-pay | Admitting: Internal Medicine

## 2017-12-09 ENCOUNTER — Telehealth: Payer: Self-pay

## 2017-12-09 NOTE — Telephone Encounter (Signed)
The note indicates iron deficiency anemia, so she needs an EGD and colonoscopy.  It is not clear to me how she was scheduled for the colonoscopy on 9/19, since I do not see an [facility] nurse visit since the office visit with AE.  We also do not have a CBC since April.  Please cancel the colonoscopy and schedule an office visit with me or AE to reassess her and plan procedures accordingly.

## 2017-12-09 NOTE — Telephone Encounter (Signed)
Dr. Loletha Carrow,  After reviewing last OV note from Amy, I had questions about whether you wanted to see pt for OV before colon. OV note indicated a 3-4 month follow up. Pt scheduled for colon, but does she need an EGD as well?  Thank you, Meg

## 2017-12-10 DIAGNOSIS — M5416 Radiculopathy, lumbar region: Secondary | ICD-10-CM | POA: Diagnosis not present

## 2017-12-10 DIAGNOSIS — M48061 Spinal stenosis, lumbar region without neurogenic claudication: Secondary | ICD-10-CM | POA: Diagnosis not present

## 2017-12-10 NOTE — Telephone Encounter (Signed)
Pt notified, new office visit made for 12/25/17 at 2pm with Nevin Bloodgood. Colon and PV canceled. Patient aware.

## 2017-12-12 ENCOUNTER — Other Ambulatory Visit: Payer: Self-pay | Admitting: Family Medicine

## 2017-12-12 DIAGNOSIS — R1032 Left lower quadrant pain: Secondary | ICD-10-CM

## 2017-12-18 ENCOUNTER — Ambulatory Visit (INDEPENDENT_AMBULATORY_CARE_PROVIDER_SITE_OTHER): Payer: Medicare Other | Admitting: Cardiology

## 2017-12-18 ENCOUNTER — Encounter: Payer: Self-pay | Admitting: Cardiology

## 2017-12-18 VITALS — BP 132/74 | HR 69 | Ht 62.0 in | Wt 146.0 lb

## 2017-12-18 DIAGNOSIS — I10 Essential (primary) hypertension: Secondary | ICD-10-CM | POA: Diagnosis not present

## 2017-12-18 DIAGNOSIS — E785 Hyperlipidemia, unspecified: Secondary | ICD-10-CM | POA: Diagnosis not present

## 2017-12-18 NOTE — Patient Instructions (Addendum)
Medication Instructions:  Your physician recommends that you continue on your current medications as directed. Please refer to the Current Medication list given to you today.  * If you need a refill on your cardiac medications before your next appointment, please call your pharmacy.   Labwork: None ordered  Testing/Procedures: None ordered  Follow-Up: No follow up is needed at this time with Dr. Curt Bears.  He will see you on an as needed basis.   Thank you for choosing [facility]!!   Trinidad Curet, RN [phone]  Any Other Special Instructions Will Be Listed Below (If Applicable). Make sure to have you primary doctor follow your blood pressure and cholesterol

## 2017-12-18 NOTE — Progress Notes (Signed)
Electrophysiology Office Note   Date:  12/18/2017   ID:  Margaret Rangel, DOB 1946/02/19, MRN [id number]  PCP:  Marjie Skiff, MD  Primary Electrophysiologist:  Constance Haw, MD    No chief complaint on file.    History of Present Illness: Margaret Rangel is a 72 y.o. female who presents today for electrophysiology evaluation.   She has a history of diabetes, hypertension, hyperlipidemia, and stroke 2. On 03/07/16, she presented to the emergency room with chest pain associated with shortness of breath, nausea, diaphoresis, and vomiting. She says that the pain occurred when she was lying in bed. The pain radiated up to her left arm. Her EKG the EMS showed she had diffuse ST depressions which were more pronounced than previous EKGs. Her pain resolved after full dose aspirin and nitroglycerin. The patient was asymptomatic in the emergency room. She quit smoking 3 months prior to her emergency room visit. The patient signed out of the emergency room at that time AMA.   Today, denies symptoms of palpitations, chest pain, shortness of breath, orthopnea, PND, lower extremity edema, claudication, dizziness, presyncope, syncope, bleeding, or neurologic sequela. The patient is tolerating medications without difficulties.  Overall she is feeling well.  She has no major complaints.  She has had no further episodes of chest pain or shortness of breath.   Past Medical History:  Diagnosis Date  . Abdominal pain   . Anal fissure   . Arthritis   . Asthma   . Chronic back pain   . Colon adenoma 2009, 2012   Colonoscopy  . Diverticulosis of colon (without mention of hemorrhage) 2009   Colonoscopy  . DM (diabetes mellitus) ([place])    off medicines for diabetes 12-18-13  . GERD (gastroesophageal reflux disease)   . Hemorrhoids   . Hernia   . HTN (hypertension)   . Hyperlipidemia    low  . Irritable bowel syndrome (IBS)   . Low blood potassium   . Obese   . Stroke ([place])    x2  .  Ulcer    Past Surgical History:  Procedure Laterality Date  . COLONOSCOPY    . FLEXIBLE SIGMOIDOSCOPY N/A 07/14/2012   Procedure: FLEXIBLE SIGMOIDOSCOPY;  Surgeon: Inda Castle, MD;  Location: [facility];  Service: Endoscopy;  Laterality: N/A;  . HEMORRHOID BANDING N/A 07/14/2012   Procedure: HEMORRHOID BANDING;  Surgeon: Inda Castle, MD;  Location: [facility];  Service: Endoscopy;  Laterality: N/A;  . PARTIAL HYSTERECTOMY  1971  . POLYPECTOMY    . TONSILLECTOMY       Current Outpatient Medications  Medication Sig Dispense Refill  . albuterol (PROVENTIL HFA;VENTOLIN HFA) 108 (90 Base) MCG/ACT inhaler Inhale 2 puffs into the lungs every 6 (six) hours as needed for wheezing or shortness of breath. 1 Inhaler 2  . aspirin (EQ ASPIRIN ADULT LOW DOSE) 81 MG EC tablet Take 1 tablet (81 mg total) by mouth daily. 90 tablet 0  . benzonatate (TESSALON PERLES) 100 MG capsule Take 1 capsule (100 mg total) by mouth 3 (three) times daily as needed for cough. 20 capsule 0  . Blood Glucose Calibration (ONETOUCH VERIO) High SOLN 1 each by In Vitro route as directed. 1 each 6  . Blood Glucose Calibration (ONETOUCH VERIO) SOLN 1 Bottle by In Vitro route as directed. 1 each 6  . Blood Glucose Monitoring Suppl (ONETOUCH VERIO) w/Device KIT 1 kit by Does not apply route 3 (three) times daily. ICD-10 code: E11.9 1 kit  0  . busPIRone (BUSPAR) 5 MG tablet Take 1 tablet (5 mg total) by mouth 2 (two) times daily. 60 tablet 0  . Calcium Carb-Cholecalciferol 600-800 MG-UNIT TABS Take one tablet daily 60 tablet 1  . carvedilol (COREG) 3.125 MG tablet TAKE 1 TABLET BY MOUTH TWICE DAILY 180 tablet 0  . dicyclomine (BENTYL) 20 MG tablet TAKE 1 TABLET BY MOUTH THREE TIMES DAILY AS NEEDED FOR SPASM 30 tablet 0  . docusate sodium (COLACE) 100 MG capsule Take 1 capsule (100 mg total) by mouth every 12 (twelve) hours. 60 capsule 0  . DULoxetine (CYMBALTA) 30 MG capsule Take 1 capsule (30 mg total) by mouth daily. 30  capsule 0  . ferrous sulfate 325 (65 FE) MG tablet Take 1 tablet (325 mg total) by mouth 2 (two) times daily with a meal. 60 tablet 3  . fexofenadine (ALLEGRA ALLERGY) 60 MG tablet Take 1 tablet (60 mg total) by mouth 2 (two) times daily. 60 tablet 0  . gabapentin (NEURONTIN) 100 MG capsule Take 1 capsule (100 mg total) by mouth at bedtime. If tolerating, increase to 1 tablet three times a day 90 capsule 0  . glimepiride (AMARYL) 2 MG tablet TAKE 1 TABLET BY MOUTH ONCE DAILY WITH  BREAKFAST 90 tablet 1  . glucose blood (ONETOUCH VERIO) test strip Use as instructed to test glucose level three times daily. ICD-10 code: E11.9 100 each 12  . Lancet Devices (ONE TOUCH DELICA LANCING DEV) MISC 1 Device by Does not apply route as directed. ICD-10 code: E11.9 1 each 0  . ONETOUCH DELICA LANCETS 42H MISC 1 each by Does not apply route 3 (three) times daily. ICD-10 code: E11.9 100 each 12  . polyethylene glycol (MIRALAX / GLYCOLAX) packet Take 17 g by mouth daily. 14 each 0  . rosuvastatin (CRESTOR) 40 MG tablet Take 1 tablet (40 mg total) by mouth daily. Please make overdue yearly appt with Dr. Curt Bears. 2nd attempt 15 tablet 0  . traMADol (ULTRAM) 50 MG tablet Take 1 tablet (50 mg total) by mouth daily as needed. 30 tablet 0  . pantoprazole (PROTONIX) 40 MG tablet Take 1 tablet (40 mg total) by mouth 2 (two) times daily for 14 days. 28 tablet 0   No current facility-administered medications for this visit.     Allergies:   Codeine and Penicillins   Social History:  The patient  reports that she quit smoking about 22 months ago. Her smoking use included cigarettes. She started smoking about 41 years ago. She has a 9.75 pack-year smoking history. She has never used smokeless tobacco. She reports that she drinks about 21.0 standard drinks of alcohol per week. She reports that she has current or past drug history. Drug: Marijuana.   Family History:  The patient's family history includes Anuerysm in her  father; Colon cancer in her brother; Heart disease in her mother; Lung cancer in her brother; Prostate cancer in her brother.    ROS:  Please see the history of present illness.   Otherwise, review of systems is positive for leg pain, cough, wheezing, abdominal pain, bloody stools, back pain, muscle pain, headaches.   All other systems are reviewed and negative.   PHYSICAL EXAM: VS:  BP 132/74   Pulse 69   Ht _0  (1.575 m)   Wt 146 lb (66.2 kg)   BMI 26.70 kg/m  , BMI Body mass index is 26.7 kg/m. GEN: Well nourished, well developed, in no acute distress  HEENT: normal  Neck: no JVD, carotid bruits, or masses Cardiac: RRR; no murmurs, rubs, or gallops,no edema  Respiratory:  clear to auscultation bilaterally, normal work of breathing GI: soft, nontender, nondistended, + BS MS: no deformity or atrophy  Skin: warm and dry Neuro:  Strength and sensation are intact Psych: euthymic mood, full affect  EKG:  EKG is not ordered today. Personal review of the ekg ordered 11/07/17 shows sinus rhythm, LVH  Recent Labs: 06/24/2017: ALT 13 07/04/2017: BUN 11; Creatinine, Ser 0.87; Magnesium 2.2; Potassium 4.3; Sodium 141 07/16/2017: Hemoglobin 9.9; Platelets 339    Lipid Panel     Component Value Date/Time   CHOL 302 (H) 06/13/2016 0806   TRIG 518 (H) 06/13/2016 0806   HDL 49 06/13/2016 0806   CHOLHDL 6.2 (H) 06/13/2016 0806   CHOLHDL 6.3 06/15/2010 0530   VLDL UNABLE TO CALCULATE IF TRIGLYCERIDE OVER 400 mg/dL 06/15/2010 0530   LDLCALC Comment 06/13/2016 0806     Wt Readings from Last 3 Encounters:  12/18/17 146 lb (66.2 kg)  11/07/17 145 lb 6.4 oz (66 kg)  08/09/17 143 lb (64.9 kg)      Other studies Reviewed: Additional studies/ records that were reviewed today include: TTE 05/24/16  Review of the above records today demonstrates:  - Left ventricle: The cavity size was normal. Wall thickness was   increased in a pattern of mild LVH. Systolic function was   vigorous. The  estimated ejection fraction was in the range of 65%   to 70%. Wall motion was normal; there were no regional wall   motion abnormalities. Doppler parameters are consistent with   abnormal left ventricular relaxation (grade 1 diastolic   dysfunction). - Aortic valve: There was trivial regurgitation.  Myoview 06/13/17  Nuclear stress EF: 70%.  The left ventricular ejection fraction is hyperdynamic (>65%).  No T wave inversion was noted during stress.  There was no ST segment deviation noted during stress.   Normal perfusion. LVEF 70% with normal wall motion. This is a low risk study.  ASSESSMENT AND PLAN:  1.  Chest pain: Had a Myoview that showed no major abnormality and was low risk.  She has had no further episodes of chest pain.  Continue with current management.  2. Hypertension: Well-controlled today.  No changes.  3. Hyperlipidemia: Currently on Crestor 40 mg.  Tolerating well.  No changes.    Current medicines are reviewed at length with the patient today.   The patient does not have concerns regarding her medicines.  The following changes were made today: None  Labs/ tests ordered today include:  No orders of the defined types were placed in this encounter.    Disposition:   FU with Sahian Kerney 1 year  Signed, Kacee Koren Meredith Leeds, MD  12/18/2017 11:05 AM     [facility] [address] [place] [address] [phone] (office) [phone] (fax)

## 2017-12-23 DIAGNOSIS — G4489 Other headache syndrome: Secondary | ICD-10-CM | POA: Diagnosis not present

## 2017-12-23 DIAGNOSIS — I10 Essential (primary) hypertension: Secondary | ICD-10-CM | POA: Diagnosis not present

## 2017-12-23 DIAGNOSIS — H538 Other visual disturbances: Secondary | ICD-10-CM | POA: Diagnosis not present

## 2017-12-25 ENCOUNTER — Encounter: Payer: Self-pay | Admitting: Nurse Practitioner

## 2017-12-25 ENCOUNTER — Other Ambulatory Visit (INDEPENDENT_AMBULATORY_CARE_PROVIDER_SITE_OTHER): Payer: Medicare Other

## 2017-12-25 ENCOUNTER — Ambulatory Visit (INDEPENDENT_AMBULATORY_CARE_PROVIDER_SITE_OTHER): Payer: Medicare Other | Admitting: Nurse Practitioner

## 2017-12-25 VITALS — BP 144/78 | HR 80 | Ht 62.5 in | Wt 150.0 lb

## 2017-12-25 DIAGNOSIS — K625 Hemorrhage of anus and rectum: Secondary | ICD-10-CM

## 2017-12-25 DIAGNOSIS — D509 Iron deficiency anemia, unspecified: Secondary | ICD-10-CM

## 2017-12-25 LAB — CBC
HCT: 40 % (ref 36.0–46.0)
Hemoglobin: 13.2 g/dL (ref 12.0–15.0)
MCHC: 33 g/dL (ref 30.0–36.0)
MCV: 84.1 fl (ref 78.0–100.0)
Platelets: 284 10*3/uL (ref 150.0–400.0)
RBC: 4.76 Mil/uL (ref 3.87–5.11)
RDW: 13.7 % (ref 11.5–15.5)
WBC: 6.9 10*3/uL (ref 4.0–10.5)

## 2017-12-25 NOTE — Patient Instructions (Signed)
If you are age 73 or older, your body mass index should be between 23-30. Your Body mass index is 27 kg/m. If this is out of the aforementioned range listed, please consider follow up with your Primary Care Provider.  If you are age 21 or younger, your body mass index should be between 19-25. Your Body mass index is 27 kg/m. If this is out of the aformentioned range listed, please consider follow up with your Primary Care Provider.   You have been scheduled for an endoscopy and colonoscopy. Please follow the written instructions given to you at your visit today. Please pick up your prep supplies at the pharmacy within the next 1-3 days. If you use inhalers (even only as needed), please bring them with you on the day of your procedure. Your physician has requested that you go to www.startemmi.com and enter the access code given to you at your visit today. This web site gives a general overview about your procedure. However, you should still follow specific instructions given to you by our office regarding your preparation for the procedure.  We have sent the following medications to your pharmacy for you to pick up at your convenience: [place] provider has requested that you go to the basement level for lab work before leaving today. Press "B" on the elevator. The lab is located at the first door on the left as you exit the elevator. CBC  HOLD IRON FIVE DAYS PRIOR TO PROCEDURE.  Thank you for choosing me and [facility].   Tye Savoy, NP

## 2017-12-25 NOTE — Progress Notes (Signed)
Primary GI:   Wilfrid Lund, MD  Chief Complaint:  Rectal bleeding       ASSESSMENT AND PLAN;    72 year old female with iron deficiency anemia (ferritin of 11 in March 2019). Became anemic around Jan 2017 when hgb fell from 14 to 10.3, she has remained in upper 9 range since.  Seen here April 2019 , patient felt unable to proceed with EGD /colonoscopy at that time.  Now presents with recent rectal bleeding (bright red) after taking ibuprofen.   -Recent rectal bleeding probably hemorrhoidal but her iron deficiency anemia has yet to be evaluated and she is due for surveillance colonoscopy given history of colon adenomas (2015).  Patient feels physically able to undergo EGD and colonoscopy now. The risks and benefits of EGD / colonoscopy with possible polypectomy were discussed and the patient agrees to proceed.  -Given recent rectal bleeding will recheck a CBC today -She has already stopped the ibuprofen -Continue the oral iron which was started 2 months ago will hold several days prior to colonoscopy   HPI:    Patient is a 72 year old female with DM 2, GERD, asthma, hypertension, degenerative joint disease .  She has a history of diverticulosis , GERD , chronic abdominal pain , adenomatous colon polyps.   We saw her in April of this year for evaluation of iron deficiency anemia.  Patient did not feel she could tolerate EGD and colonoscopy at that time. She wass tarted on iron a couple of days ago.  .   In August she started ibuprofen three time a day. Last week she had episodes of rectal bleeding.  Patient does not feel she was constipated at the time .  Initial episode of blood was what she describes as a large amount subsequently declining and volume thereafter . She started using Prep H and bleeding has improved but not resolved.  Patient has chronic intermittent abdominal discomfort especially across her lower abdomen.  This pain is intermittent, unchanged through the years.   No general  medical complaints.  She was acting a little unusual at home a few days ago. Daughter called EMS.  Patient says paramedics found her blood pressure to be elevated but nothing else was wrong.  Patient was not taken to the hospital.  She has felt fine since.  BP in the office today is okay   Past Medical History:  Diagnosis Date  . Abdominal pain   . Anal fissure   . Arthritis   . Asthma   . Chronic back pain   . Colon adenoma 2009, 2012   Colonoscopy  . Diverticulosis of colon (without mention of hemorrhage) 2009   Colonoscopy  . DM (diabetes mellitus) ([place])    off medicines for diabetes 12-18-13  . GERD (gastroesophageal reflux disease)   . Hemorrhoids   . Hernia   . HTN (hypertension)   . Hyperlipidemia    low  . Irritable bowel syndrome (IBS)   . Low blood potassium   . Obese   . Stroke ([place])    x2  . Ulcer      Past Surgical History:  Procedure Laterality Date  . COLONOSCOPY    . FLEXIBLE SIGMOIDOSCOPY N/A 07/14/2012   Procedure: FLEXIBLE SIGMOIDOSCOPY;  Surgeon: Inda Castle, MD;  Location: [facility];  Service: Endoscopy;  Laterality: N/A;  . HEMORRHOID BANDING N/A 07/14/2012   Procedure: HEMORRHOID BANDING;  Surgeon: Inda Castle, MD;  Location: [facility];  Service: Endoscopy;  Laterality: N/A;  .  PARTIAL HYSTERECTOMY  1971  . POLYPECTOMY    . TONSILLECTOMY     Family History  Problem Relation Age of Onset  . Prostate cancer Brother   . Heart disease Mother   . Anuerysm Father   . Lung cancer Brother   . Colon cancer Brother   . Rectal cancer Neg Hx   . Stomach cancer Neg Hx    Social History   Tobacco Use  . Smoking status: Former Smoker    Packs/day: 0.25    Years: 39.00    Pack years: 9.75    Types: Cigarettes    Start date: 04/16/1976    Last attempt to quit: 02/13/2016    Years since quitting: 1.8  . Smokeless tobacco: Never Used  . Tobacco comment: 4-5 cigs per day for 39 years  Substance Use Topics  . Alcohol use: Yes     Alcohol/week: 21.0 standard drinks    Types: 21 Cans of beer per week  . Drug use: Yes    Types: Marijuana    Comment: using for depression   Current Outpatient Medications  Medication Sig Dispense Refill  . albuterol (PROVENTIL HFA;VENTOLIN HFA) 108 (90 Base) MCG/ACT inhaler Inhale 2 puffs into the lungs every 6 (six) hours as needed for wheezing or shortness of breath. 1 Inhaler 2  . aspirin (EQ ASPIRIN ADULT LOW DOSE) 81 MG EC tablet Take 1 tablet (81 mg total) by mouth daily. 90 tablet 0  . benzonatate (TESSALON PERLES) 100 MG capsule Take 1 capsule (100 mg total) by mouth 3 (three) times daily as needed for cough. 20 capsule 0  . Blood Glucose Calibration (ONETOUCH VERIO) High SOLN 1 each by In Vitro route as directed. 1 each 6  . Blood Glucose Calibration (ONETOUCH VERIO) SOLN 1 Bottle by In Vitro route as directed. 1 each 6  . Blood Glucose Monitoring Suppl (ONETOUCH VERIO) w/Device KIT 1 kit by Does not apply route 3 (three) times daily. ICD-10 code: E11.9 1 kit 0  . busPIRone (BUSPAR) 5 MG tablet Take 1 tablet (5 mg total) by mouth 2 (two) times daily. 60 tablet 0  . Calcium Carb-Cholecalciferol 600-800 MG-UNIT TABS Take one tablet daily 60 tablet 1  . carvedilol (COREG) 3.125 MG tablet TAKE 1 TABLET BY MOUTH TWICE DAILY 180 tablet 0  . dicyclomine (BENTYL) 20 MG tablet TAKE 1 TABLET BY MOUTH THREE TIMES DAILY AS NEEDED FOR SPASM 30 tablet 0  . docusate sodium (COLACE) 100 MG capsule Take 1 capsule (100 mg total) by mouth every 12 (twelve) hours. 60 capsule 0  . DULoxetine (CYMBALTA) 30 MG capsule Take 1 capsule (30 mg total) by mouth daily. 30 capsule 0  . ferrous sulfate 325 (65 FE) MG tablet Take 1 tablet (325 mg total) by mouth 2 (two) times daily with a meal. 60 tablet 3  . fexofenadine (ALLEGRA ALLERGY) 60 MG tablet Take 1 tablet (60 mg total) by mouth 2 (two) times daily. 60 tablet 0  . gabapentin (NEURONTIN) 100 MG capsule Take 1 capsule (100 mg total) by mouth at bedtime. If  tolerating, increase to 1 tablet three times a day 90 capsule 0  . glimepiride (AMARYL) 2 MG tablet TAKE 1 TABLET BY MOUTH ONCE DAILY WITH  BREAKFAST 90 tablet 1  . glucose blood (ONETOUCH VERIO) test strip Use as instructed to test glucose level three times daily. ICD-10 code: E11.9 100 each 12  . Lancet Devices (ONE TOUCH DELICA LANCING DEV) MISC 1 Device by Does not  apply route as directed. ICD-10 code: E11.9 1 each 0  . ONETOUCH DELICA LANCETS 16X MISC 1 each by Does not apply route 3 (three) times daily. ICD-10 code: E11.9 100 each 12  . polyethylene glycol (MIRALAX / GLYCOLAX) packet Take 17 g by mouth daily. 14 each 0  . rosuvastatin (CRESTOR) 40 MG tablet Take 1 tablet (40 mg total) by mouth daily. Please make overdue yearly appt with Dr. Curt Bears. 2nd attempt 15 tablet 0  . traMADol (ULTRAM) 50 MG tablet Take 1 tablet (50 mg total) by mouth daily as needed. 30 tablet 0  . pantoprazole (PROTONIX) 40 MG tablet Take 1 tablet (40 mg total) by mouth 2 (two) times daily for 14 days. 28 tablet 0   No current facility-administered medications for this visit.    Allergies  Allergen Reactions  . Codeine Itching  . Penicillins Itching    Has patient had a PCN reaction causing immediate rash, facial/tongue/throat swelling, SOB or lightheadedness with hypotension:No Has patient had a PCN reaction causing severe rash involving mucus membranes or skin necrosis:No Has patient had a PCN reaction that required hopititalization:No Has patient had a PCN reaction occurring within the last 10 years:No If all of the above answers are "NO", then may proceed with Cephalosporin use.      Review of Systems: All systems reviewed and negative except where noted in HPI.   Creatinine clearance cannot be calculated (Patient's most recent lab result is older than the maximum 21 days allowed.)   Physical Exam:    Wt Readings from Last 3 Encounters:  12/25/17 150 lb (68 kg)  12/18/17 146 lb (66.2 kg)    11/07/17 145 lb 6.4 oz (66 kg)    BP (!) 144/78 (BP Location: Left Arm, Patient Position: Sitting, Cuff Size: Normal)   Pulse 80   Ht 5' 2.5" (1.588 m)   Wt 150 lb (68 kg)   BMI 27.00 kg/m  Constitutional:  Pleasant female in no acute distress. Psychiatric: Normal mood and affect. Behavior is normal. EENT: Pupils normal.  Conjunctivae are normal. No scleral icterus. Neck supple.  Cardiovascular: Normal rate, regular rhythm. No edema Pulmonary/chest: Effort normal and breath sounds normal. No wheezing, rales or rhonchi. Abdominal: Soft, nondistended, nontender. Bowel sounds active throughout. There are no masses palpable. No hepatomegaly. Neurological: Alert and oriented to person place and time. Skin: Skin is warm and dry. No rashes noted.  Tye Savoy, NP  12/25/2017, 2:32 PM

## 2017-12-26 ENCOUNTER — Encounter: Payer: Self-pay | Admitting: Nurse Practitioner

## 2017-12-26 NOTE — Progress Notes (Signed)
Thank you for sending this case to me. I have reviewed the entire note, and the outlined plan seems appropriate.  Last known ferritin = 11 and normal creatinine March 2019. Endoscopic workup appropriate.  Wilfrid Lund, MD

## 2018-01-02 ENCOUNTER — Encounter: Payer: Medicare Other | Admitting: Gastroenterology

## 2018-01-02 DIAGNOSIS — M48061 Spinal stenosis, lumbar region without neurogenic claudication: Secondary | ICD-10-CM | POA: Diagnosis not present

## 2018-01-21 ENCOUNTER — Other Ambulatory Visit: Payer: Self-pay | Admitting: Internal Medicine

## 2018-01-21 ENCOUNTER — Other Ambulatory Visit: Payer: Self-pay | Admitting: Family Medicine

## 2018-01-21 ENCOUNTER — Telehealth: Payer: Self-pay

## 2018-01-21 MED ORDER — TRAMADOL HCL 50 MG PO TABS: 50.0000 mg | ORAL_TABLET | Freq: Every day | ORAL | 0 refills | Status: DC | PRN

## 2018-01-21 NOTE — Telephone Encounter (Signed)
Pt contacted and informed of rx to pharmacy and just enough to get her to her 10/10 apt.

## 2018-01-21 NOTE — Telephone Encounter (Signed)
I will send 5 pills until she is seen in clinic on 10/10. Please inform patient.  Thanks  Marjie Skiff, MD [place], PGY-3

## 2018-01-21 NOTE — Telephone Encounter (Signed)
Pt called nurse stating she was in a lot of pain with her knees, especially her right knee. It has been really hard getting up and in bed and on and off the commode. I scheduled pt for ATC for 10/10. Pt is hoping she can get some Tramadol until her 10/10.

## 2018-01-23 ENCOUNTER — Ambulatory Visit: Payer: Medicare Other

## 2018-01-23 ENCOUNTER — Ambulatory Visit (INDEPENDENT_AMBULATORY_CARE_PROVIDER_SITE_OTHER): Payer: Medicare Other | Admitting: Family Medicine

## 2018-01-23 ENCOUNTER — Other Ambulatory Visit: Payer: Self-pay

## 2018-01-23 VITALS — BP 150/60 | HR 84 | Temp 97.6°F | Wt 151.0 lb

## 2018-01-23 DIAGNOSIS — Z23 Encounter for immunization: Secondary | ICD-10-CM

## 2018-01-23 DIAGNOSIS — M25511 Pain in right shoulder: Secondary | ICD-10-CM

## 2018-01-23 DIAGNOSIS — M1711 Unilateral primary osteoarthritis, right knee: Secondary | ICD-10-CM | POA: Diagnosis not present

## 2018-01-23 DIAGNOSIS — M25512 Pain in left shoulder: Secondary | ICD-10-CM | POA: Diagnosis not present

## 2018-01-23 DIAGNOSIS — M170 Bilateral primary osteoarthritis of knee: Secondary | ICD-10-CM | POA: Insufficient documentation

## 2018-01-23 MED ORDER — ALBUTEROL SULFATE HFA 108 (90 BASE) MCG/ACT IN AERS
2.0000 | INHALATION_SPRAY | Freq: Four times a day (QID) | RESPIRATORY_TRACT | 2 refills | Status: DC | PRN
Start: 2018-01-23 — End: 2018-06-16

## 2018-01-23 MED ORDER — METHYLPREDNISOLONE ACETATE 40 MG/ML IJ SUSP
40.0000 mg | Freq: Once | INTRAMUSCULAR | Status: AC
  Administered 2018-01-23: 40 mg via INTRAMUSCULAR

## 2018-01-23 MED ORDER — TRAMADOL HCL 50 MG PO TABS: 50.0000 mg | ORAL_TABLET | Freq: Two times a day (BID) | ORAL | 0 refills | Status: DC | PRN

## 2018-01-23 NOTE — Progress Notes (Signed)
Subjective:    Patient ID: Margaret Rangel, female    DOB: 1945/09/05, 72 y.o.   MRN: [id number]   CC: knee pain  Margaret Rangel is a 72 y.o. female   HPI:    Knee Pain: Patient presents with knee pain involving the  bilateral knee. Onset of the symptoms was several months ago. Inciting event: none known. Current symptoms include pain located over knee cap and stiffness. Pain is aggravated by any weight bearing, going up and down stairs, kneeling, lateral movements, squatting, standing and walking.  Patient has had no prior knee problems. Evaluation to date: Dg R knee 2011 showed tricompartmental OA. Treatment to date: rest and tramadol, puts hot and cool patches on them but does not help.  Shoulder Pain: Bilateral shoulder pain that started about 1 month and a half ago. Feels like stabbing with hot in shoulders. No swelling. Sometimes has a catch in shoulder and can't lift the. No trauma or fall. Hurts to burch hair, is having to get daughter to brush her hair. Hard to do ADL's now, and started a month and a half ago. Lives alone.   ROS: no fevers  Smoking status reviewed  ROS: 10 point ROS is otherwise negative, except as mentioned in HPI  Patient Active Problem List   Diagnosis Date Noted  . Osteoarthritis of both knees 01/23/2018  . Shoulder pain, bilateral 01/23/2018  . Chest pain 11/08/2017  . Abscess of female genitalia 05/03/2017  . GAD (generalized anxiety disorder) 11/22/2016  . Seasonal allergies 11/22/2016  . Asthma, chronic 11/22/2016  . History of stroke 09/13/2016  . Diverticulosis of colon 09/13/2016  . Osteoarthritis of right hip 05/29/2016  . Osteoporosis 05/01/2016  . Systolic murmur 27/09/2374  . Essential hypertension 04/02/2016  . Chest pain with moderate risk for cardiac etiology 03/07/2016  . History of esophageal stricture 03/01/2016  . Rectal bleeding 02/02/2014  . Esophageal reflux 02/02/2014  . Personal history of colonic polyps 12/09/2013  .  Dysphagia, unspecified(787.20) 09/11/2012  . Nausea alone 09/11/2012  . Diabetes mellitus without mention of complication 28/31/5176     Objective:  BP (!) 150/60   Pulse 84   Temp 97.6 F (36.4 C) (Oral)   Wt 151 lb (68.5 kg)   SpO2 99%   BMI 27.18 kg/m  Vitals and nursing note reviewed  General: NAD, pleasant Shoulders:Normal to inspection with no erythema or effusion or obvious bony abnormalities. Palpation normal with no warmth. ROM severely limited in all planes Knees: Normal to inspection with no erythema or effusion or obvious bony abnormalities. Palpation normal with no warmth or joint line tenderness or patellar tenderness or condyle tenderness. ROM severely limited d/t in flexion and extension and lower leg rotation. Extremities: no edema or cyanosis. WWP. Skin: warm and dry, no rashes noted Neuro: alert and oriented, no focal deficits Psych: normal affect  Assessment & Plan:    Osteoarthritis of both knees Patient with previous findings of tricompartmental OA of R knee in 2011. Reports worsening pain over the past 2 months. Patient would like injection in R knee today as it is the worst. See note below. Patient to follow up in 1-2 weeks to see if injection has helped and may need injection in L knee. Patient also given refill of tramadol, as she is unable to tolerate tylenol.   Patient also to be referred for knee surgery as her pain is severely limiting patient's ADL's.  Shoulder pain, bilateral Reports worsening pain over the past 1.5  months. Given injection in R knee today as it is the worst. See note below. Patient to follow up in 1-2 weeks to see if injection has helped and may need injection in shoulders for pain, or further evaluation.   Patient also given refill of tramadol, as she is unable to tolerate tylenol.   INJECTION:  Patient was given informed consent, signed copy in the chart. Appropriate time out was taken. Area prepped and draped in usual sterile  fashion. One cc of methylprednisolone 40 mg/ml plus one cc of 1% lidocaine without epinephrine was injected into the  joint using a(n) perpendicular approach. The patient tolerated the procedure well. There were no complications. Post procedure instructions were given.  Of note, patient may benefit from bisphosphonate therapy, and was asked to discuss this with her PCP.   Margaret Zeola Brys, DO Family Medicine Resident PGY-2

## 2018-01-23 NOTE — Assessment & Plan Note (Addendum)
Patient with previous findings of tricompartmental OA of R knee in 2011. Reports worsening pain over the past 2 months. Patient would like injection in R knee today as it is the worst. See note below. Patient to follow up in 1-2 weeks to see if injection has helped and may need injection in L knee. Patient also given refill of tramadol, as she is unable to tolerate tylenol.   Patient also to be referred for knee surgery as her pain is severely limiting patient's ADL's.

## 2018-01-23 NOTE — Patient Instructions (Signed)
Thank you for coming to see me today. It was a pleasure! Today we talked about:   Your knee and shoulder pain. We injected your right knee and the pain should improve over the next few days. I have sent tramadol to your pharmacy to help you with your pain as well.   Please follow-up with Dr. Andy Gauss in 1-2 weeks or sooner as needed for injections if needed.  If you have any questions or concerns, please do not hesitate to call the office at [phone].  Take Care,   [place] Jahree Dermody, DO

## 2018-01-23 NOTE — Assessment & Plan Note (Signed)
Reports worsening pain over the past 1.5 months. Given injection in R knee today as it is the worst. See note below. Patient to follow up in 1-2 weeks to see if injection has helped and may need injection in shoulders for pain, or further evaluation.   Patient also given refill of tramadol, as she is unable to tolerate tylenol.

## 2018-02-04 ENCOUNTER — Telehealth: Payer: Self-pay | Admitting: Gastroenterology

## 2018-02-04 DIAGNOSIS — K625 Hemorrhage of anus and rectum: Secondary | ICD-10-CM

## 2018-02-04 DIAGNOSIS — D509 Iron deficiency anemia, unspecified: Secondary | ICD-10-CM

## 2018-02-04 MED ORDER — NA SULFATE-K SULFATE-MG SULF 17.5-3.13-1.6 GM/177ML PO SOLN: 1.0000 | Freq: Once | ORAL | 0 refills | Status: AC

## 2018-02-04 NOTE — Telephone Encounter (Signed)
Sent suprep to [facility].

## 2018-02-05 ENCOUNTER — Ambulatory Visit: Payer: Medicare Other | Admitting: Family Medicine

## 2018-02-12 ENCOUNTER — Ambulatory Visit: Payer: Medicare Other | Admitting: Family Medicine

## 2018-02-12 ENCOUNTER — Encounter: Payer: Medicare Other | Admitting: Gastroenterology

## 2018-02-13 ENCOUNTER — Ambulatory Visit (AMBULATORY_SURGERY_CENTER): Payer: Medicare Other | Admitting: Gastroenterology

## 2018-02-13 ENCOUNTER — Encounter: Payer: Self-pay | Admitting: Gastroenterology

## 2018-02-13 VITALS — BP 158/73 | HR 68 | Temp 97.5°F | Resp 15 | Ht 62.5 in | Wt 150.0 lb

## 2018-02-13 DIAGNOSIS — K5730 Diverticulosis of large intestine without perforation or abscess without bleeding: Secondary | ICD-10-CM | POA: Diagnosis not present

## 2018-02-13 DIAGNOSIS — K625 Hemorrhage of anus and rectum: Secondary | ICD-10-CM

## 2018-02-13 DIAGNOSIS — D509 Iron deficiency anemia, unspecified: Secondary | ICD-10-CM

## 2018-02-13 DIAGNOSIS — D122 Benign neoplasm of ascending colon: Secondary | ICD-10-CM | POA: Diagnosis not present

## 2018-02-13 DIAGNOSIS — K648 Other hemorrhoids: Secondary | ICD-10-CM | POA: Diagnosis not present

## 2018-02-13 DIAGNOSIS — Z1211 Encounter for screening for malignant neoplasm of colon: Secondary | ICD-10-CM | POA: Diagnosis not present

## 2018-02-13 DIAGNOSIS — D125 Benign neoplasm of sigmoid colon: Secondary | ICD-10-CM

## 2018-02-13 MED ORDER — SODIUM CHLORIDE 0.9 % IV SOLN: 500.0000 mL | Freq: Once | INTRAVENOUS | Status: DC

## 2018-02-13 NOTE — Progress Notes (Signed)
A and O x3. Report to RN. Tolerated MAC anesthesia well.Teeth unchanged after procedure.

## 2018-02-13 NOTE — Op Note (Signed)
[facility] Patient Name: Annaliesa Blann Procedure Date: 02/13/2018 1:31 PM MRN: [id number] Endoscopist: Mallie Mussel L. Loletha Carrow , MD Age: 72 Referring MD:  Date of Birth: 08/27/1945 Gender: Female Account #: [id number] Procedure:                Upper GI endoscopy Indications:              Iron deficiency anemia (Hgb recently normalized on                            iron therapy) Medicines:                Monitored Anesthesia Care Procedure:                Pre-Anesthesia Assessment:                           - Prior to the procedure, a History and Physical                            was performed, and patient medications and                            allergies were reviewed. The patient's tolerance of                            previous anesthesia was also reviewed. The risks                            and benefits of the procedure and the sedation                            options and risks were discussed with the patient.                            All questions were answered, and informed consent                            was obtained. Anticoagulants: The patient has taken                            aspirin. It was decided not to withhold this                            medication prior to the procedure. ASA Grade                            Assessment: III - A patient with severe systemic                            disease. After reviewing the risks and benefits,                            the patient was deemed in satisfactory condition to  undergo the procedure.                           After obtaining informed consent, the endoscope was                            passed under direct vision. Throughout the                            procedure, the patient's blood pressure, pulse, and                            oxygen saturations were monitored continuously. The                            Endoscope was introduced through the mouth, and                 advanced to the second part of duodenum. The upper                            GI endoscopy was accomplished without difficulty.                            The patient tolerated the procedure well. Scope In: Scope Out: Findings:                 The larynx was normal.                           The esophagus was normal.                           Patchy mildly erythematous mucosa was found in the                            prepyloric region of the stomach.                           The exam of the stomach was otherwise normal,                            including on retroflexion.                           The examined duodenum was normal. Complications:            No immediate complications. Estimated Blood Loss:     Estimated blood loss: none. Impression:               - Normal larynx.                           - Normal esophagus.                           - Erythematous mucosa in the prepyloric region of  the stomach.                           - Normal examined duodenum.                           - No specimens collected. Recommendation:           - Patient has a contact number available for                            emergencies. The signs and symptoms of potential                            delayed complications were discussed with the                            patient. Return to normal activities tomorrow.                            Written discharge instructions were provided to the                            patient.                           - Resume previous diet.                           - Continue present medications.                           - See the other procedure note for documentation of                            additional recommendations. Henry L. Loletha Carrow, MD 02/13/2018 2:03:47 PM This report has been signed electronically.

## 2018-02-13 NOTE — Progress Notes (Signed)
Called to room to assist during endoscopic procedure.  Patient ID and intended procedure confirmed with present staff. Received instructions for my participation in the procedure from the performing physician.  

## 2018-02-13 NOTE — Op Note (Signed)
[place] Patient Name: Margaret Rangel Procedure Date: 02/13/2018 1:30 PM MRN: [id number] Endoscopist: Mallie Mussel L. Loletha Carrow , MD Age: 72 Referring MD:  Date of Birth: 07/01/1945 Gender: Female Account #: [id number] Procedure:                Colonoscopy Indications:              Rectal bleeding (few episodes), Iron deficiency                            anemia (recent Hgb normalized after iron treatment) Medicines:                Monitored Anesthesia Care Procedure:                Pre-Anesthesia Assessment:                           - Prior to the procedure, a History and Physical                            was performed, and patient medications and                            allergies were reviewed. The patient's tolerance of                            previous anesthesia was also reviewed. The risks                            and benefits of the procedure and the sedation                            options and risks were discussed with the patient.                            All questions were answered, and informed consent                            was obtained. Anticoagulants: The patient has taken                            aspirin. It was decided not to withhold this                            medication prior to the procedure. ASA Grade                            Assessment: III - A patient with severe systemic                            disease. After reviewing the risks and benefits,                            the patient was deemed in satisfactory condition to  undergo the procedure.                           After obtaining informed consent, the colonoscope                            was passed under direct vision. Throughout the                            procedure, the patient's blood pressure, pulse, and                            oxygen saturations were monitored continuously. The                            Colonoscope was introduced  through the anus and                            advanced to the the cecum, identified by                            appendiceal orifice and ileocecal valve. The                            colonoscopy was performed without difficulty. The                            patient tolerated the procedure well. The quality                            of the bowel preparation was excellent. The                            ileocecal valve, appendiceal orifice, and rectum                            were photographed. The quality of the bowel                            preparation was evaluated using the BBPS [facility]                            Bowel Preparation Scale) with scores of: Right                            Colon = 3, Transverse Colon = 3 and Left Colon = 2.                            The total BBPS score equals 8. Scope In: 1:45:41 PM Scope Out: 2:00:19 PM Scope Withdrawal Time: 0 hours 10 minutes 26 seconds  Total Procedure Duration: 0 hours 14 minutes 38 seconds  Findings:                 The perianal and digital rectal examinations were  normal.                           Diverticula were found in the left colon.                           Two sessile polyps were found in the ascending                            colon. The polyps were 4 mm in size. These polyps                            were removed with a cold snare. Resection and                            retrieval were complete.                           A 4 mm polyp was found in the sigmoid colon. The                            polyp was sessile. The polyp was removed with a                            cold snare. Resection and retrieval were complete.                           Internal hemorrhoids were found. The hemorrhoids                            were small.                           The exam was otherwise without abnormality on                            direct and retroflexion views. Complications:             No immediate complications. Estimated Blood Loss:     Estimated blood loss was minimal. Impression:               - Diverticulosis in the left colon.                           - Two 4 mm polyps in the ascending colon, removed                            with a cold snare. Resected and retrieved.                           - One 4 mm polyp in the sigmoid colon, removed with                            a cold snare. Resected and retrieved.                           -  Internal hemorrhoids.                           - The examination was otherwise normal on direct                            and retroflexion views.                           No source of chronic blood loss seen on either EGD                            or colonoscopy. Recommendation:           - Patient has a contact number available for                            emergencies. The signs and symptoms of potential                            delayed complications were discussed with the                            patient. Return to normal activities tomorrow.                            Written discharge instructions were provided to the                            patient.                           - Resume previous diet.                           - Continue present medications.                           - Await pathology results.                           - Repeat colonoscopy is recommended for                            surveillance. The colonoscopy date will be                            determined after pathology results from today's                            exam become available for review. (no longer than 5                            years due to family history of colon cancer in                            brother)                           -  Return to primary care physician to follow anemia                            and iron therapy. Tawana Pasch L. Loletha Carrow, MD 02/13/2018 2:11:53 PM This report has been signed  electronically.

## 2018-02-13 NOTE — Patient Instructions (Signed)
Discharge instructions given. Handouts on polyps,diverticulosis and hemorrhoids. Resume previous medications. YOU HAD AN ENDOSCOPIC PROCEDURE TODAY AT THE [facility]:   Refer to the procedure report that was given to you for any specific questions about what was found during the examination.  If the procedure report does not answer your questions, please call your gastroenterologist to clarify.  If you requested that your care partner not be given the details of your procedure findings, then the procedure report has been included in a sealed envelope for you to review at your convenience later.  YOU SHOULD EXPECT: Some feelings of bloating in the abdomen. Passage of more gas than usual.  Walking can help get rid of the air that was put into your GI tract during the procedure and reduce the bloating. If you had a lower endoscopy (such as a colonoscopy or flexible sigmoidoscopy) you may notice spotting of blood in your stool or on the toilet paper. If you underwent a bowel prep for your procedure, you may not have a normal bowel movement for a few days.  Please Note:  You might notice some irritation and congestion in your nose or some drainage.  This is from the oxygen used during your procedure.  There is no need for concern and it should clear up in a day or so.  SYMPTOMS TO REPORT IMMEDIATELY:   Following lower endoscopy (colonoscopy or flexible sigmoidoscopy):  Excessive amounts of blood in the stool  Significant tenderness or worsening of abdominal pains  Swelling of the abdomen that is new, acute  Fever of 100F or higher   Following upper endoscopy (EGD)  Vomiting of blood or coffee ground material  New chest pain or pain under the shoulder blades  Painful or persistently difficult swallowing  New shortness of breath  Fever of 100F or higher  Black, tarry-looking stools  For urgent or emergent issues, a gastroenterologist can be reached at any hour by calling [phone].   DIET:  We do recommend a small meal at first, but then you may proceed to your regular diet.  Drink plenty of fluids but you should avoid alcoholic beverages for 24 hours.  ACTIVITY:  You should plan to take it easy for the rest of today and you should NOT DRIVE or use heavy machinery until tomorrow (because of the sedation medicines used during the test).    FOLLOW UP: Our staff will call the number listed on your records the next business day following your procedure to check on you and address any questions or concerns that you may have regarding the information given to you following your procedure. If we do not reach you, we will leave a message.  However, if you are feeling well and you are not experiencing any problems, there is no need to return our call.  We will assume that you have returned to your regular daily activities without incident.  If any biopsies were taken you will be contacted by phone or by letter within the next 1-3 weeks.  Please call us at [phone] if you have not heard about the biopsies in 3 weeks.    SIGNATURES/CONFIDENTIALITY: You and/or your care partner have signed paperwork which will be entered into your electronic medical record.  These signatures attest to the fact that that the information above on your After Visit Summary has been reviewed and is understood.  Full responsibility of the confidentiality of this discharge information lies with you and/or your care-partner.    

## 2018-02-13 NOTE — Progress Notes (Signed)
Pt's states no medical or surgical changes since previsit or office visit. 

## 2018-02-14 ENCOUNTER — Telehealth: Payer: Self-pay

## 2018-02-14 NOTE — Telephone Encounter (Signed)
  Follow up Call-  Call back number 02/13/2018  Post procedure Call Back phone  # [phone]  Permission to leave phone message Yes  Some recent data might be hidden     Patient questions:  Do you have a fever, pain , or abdominal swelling? No. Pain Score  0 *  Have you tolerated food without any problems? Yes.    Have you been able to return to your normal activities? Yes.    Do you have any questions about your discharge instructions: Diet   No. Medications  No. Follow up visit  No.  Do you have questions or concerns about your Care? No.  Actions: * If pain score is 4 or above: No action needed, pain <4.

## 2018-02-25 ENCOUNTER — Encounter: Payer: Self-pay | Admitting: Gastroenterology

## 2018-03-06 ENCOUNTER — Telehealth: Payer: Self-pay | Admitting: Gastroenterology

## 2018-03-06 NOTE — Telephone Encounter (Signed)
Spoke to patient explained that the polyps were removed at the recent colonoscopy, next due in 3 years.

## 2018-04-02 ENCOUNTER — Ambulatory Visit: Payer: Medicare Other | Admitting: Family Medicine

## 2018-04-10 ENCOUNTER — Ambulatory Visit: Payer: Medicare Other | Admitting: Family Medicine

## 2018-05-06 ENCOUNTER — Other Ambulatory Visit: Payer: Self-pay | Admitting: Family Medicine

## 2018-05-13 ENCOUNTER — Other Ambulatory Visit: Payer: Self-pay | Admitting: Family Medicine

## 2018-05-13 ENCOUNTER — Other Ambulatory Visit: Payer: Self-pay | Admitting: Internal Medicine

## 2018-05-20 DIAGNOSIS — M48061 Spinal stenosis, lumbar region without neurogenic claudication: Secondary | ICD-10-CM | POA: Diagnosis not present

## 2018-06-16 ENCOUNTER — Ambulatory Visit (INDEPENDENT_AMBULATORY_CARE_PROVIDER_SITE_OTHER): Payer: Medicare Other | Admitting: Student in an Organized Health Care Education/Training Program

## 2018-06-16 ENCOUNTER — Other Ambulatory Visit: Payer: Self-pay

## 2018-06-16 ENCOUNTER — Encounter: Payer: Self-pay | Admitting: Student in an Organized Health Care Education/Training Program

## 2018-06-16 VITALS — BP 126/70 | HR 86 | Temp 98.4°F | Ht 62.5 in | Wt 158.0 lb

## 2018-06-16 DIAGNOSIS — M791 Myalgia, unspecified site: Secondary | ICD-10-CM | POA: Diagnosis not present

## 2018-06-16 DIAGNOSIS — M1711 Unilateral primary osteoarthritis, right knee: Secondary | ICD-10-CM | POA: Diagnosis not present

## 2018-06-16 DIAGNOSIS — M170 Bilateral primary osteoarthritis of knee: Secondary | ICD-10-CM

## 2018-06-16 MED ORDER — MELOXICAM 15 MG PO TABS: 15.0000 mg | ORAL_TABLET | Freq: Every day | ORAL | 0 refills | Status: DC

## 2018-06-16 MED ORDER — GLUCOSE BLOOD VI STRP: ORAL_STRIP | 12 refills | Status: AC

## 2018-06-16 MED ORDER — DICLOFENAC SODIUM 1 % TD GEL: 2.0000 g | Freq: Four times a day (QID) | TRANSDERMAL | 0 refills | Status: DC

## 2018-06-16 MED ORDER — METHYLPREDNISOLONE ACETATE 40 MG/ML IJ SUSP
40.0000 mg | Freq: Once | INTRAMUSCULAR | Status: AC
  Administered 2018-06-16: 40 mg via INTRAMUSCULAR

## 2018-06-16 MED ORDER — ALBUTEROL SULFATE HFA 108 (90 BASE) MCG/ACT IN AERS: 2.0000 | INHALATION_SPRAY | Freq: Four times a day (QID) | RESPIRATORY_TRACT | 2 refills | Status: AC | PRN

## 2018-06-16 NOTE — Assessment & Plan Note (Addendum)
Given tenderness in all large muscle groups, will check a CK in the office today. Patient is on a statin. She is not interested in joint replacement surgery. - no red flags for joint infection - 40 mg solumedrol right knee joint injection today in the office - meloxicam qd (it is on beers list, but patient had normal EGD in 01/2018 and she has significant limitations in ADLs due to pain) - voltaren gel PRN - encourage continued movement at the joint - return precautions provided - follow up as needed

## 2018-06-16 NOTE — Patient Instructions (Signed)
It was a pleasure seeing you today in our clinic. Here is the treatment plan we have discussed and agreed upon together:  It was a pleasure seeing you today in our clinic. Today we discussed your knee pain and did a right knee injection. Here is the treatment plan we have discussed and agreed upon together:  - You may be sore over the next day, however please continue to move around so the joint does not get stiff. - Use the medications I prescribed for pain - If you notice an area of redness with warmth, swelling, pain, or you develop fevers, these would be reasons to call our office back or come in to be seen.  We drew blood work at today's visit. I will call or send you a letter with these results. If you do not hear from me within the next week, please give our office a call.  Our clinic's number is [phone]. Please call with questions or concerns about what we discussed today.  Be well, Dr. Burr Medico

## 2018-06-16 NOTE — Progress Notes (Signed)
CC: right knee pain  HPI: Margaret Rangel is a 73 y.o. female with PMH significant for: Past Medical History:  Diagnosis Date  . Abdominal pain   . Anal fissure   . Arthritis   . Asthma   . Chronic back pain   . Colon adenoma 2009, 2012   Colonoscopy  . Diverticulosis of colon (without mention of hemorrhage) 2009   Colonoscopy  . DM (diabetes mellitus) ([place])    off medicines for diabetes 12-18-13  . GERD (gastroesophageal reflux disease)   . Hemorrhoids   . Hernia   . HTN (hypertension)   . Hyperlipidemia    low  . Irritable bowel syndrome (IBS)   . Low blood potassium   . Obese   . Stroke ([place])    x2  . Ulcer    Patient presents the clinic today with significant right knee pain.  She reports that she has a history of osteoarthritis.  She underwent a right knee injection in October 2019.  She previously had tried tramadol which helped slightly with the pain.  She does not tolerate Tylenol.  She was referred for knee replacement surgery.  Today the patient reports that the knee injection improved her pain significantly for about 2 months, and then her pain started to return.  She reports that her pain worsened about 2 months ago.  She is having difficulty getting around and performing her ADLs.  Sometimes she has to drag her right leg due to pain.  She has difficulty completing her ADLs and feeding herself/making meals.  She is not interested in knee replacement surgery because she is concerned that she cannot spend a month in rehab and she will not have any help at home.  Review of Symptoms:  See HPI for ROS.   CC, SH/smoking status, and VS noted.  Objective: BP 126/70 (BP Location: Left Wrist, Patient Position: Sitting, Cuff Size: Normal)   Pulse 86   Temp 98.4 F (36.9 C) (Oral)   Ht 5' 2.5" (1.588 m)   Wt 158 lb (71.7 kg)   SpO2 95%   BMI 28.44 kg/m  GEN: NAD, alert, cooperative, and pleasant. RESPIRATORY: Comfortable work of breathing, speaks in full  sentences CV: Regular rate noted, distal extremities well perfused and warm without edema GI: Soft, nondistended SKIN: warm and dry, no rashes or lesions NEURO: II-XII grossly intact MSK: Moves 4 extremities equally PSYCH: AAOx3, appropriate affect Knee: Normal to inspection with no erythema or effusion or obvious bony abnormalities. Significantly tender to palpation throughout. No warmth. Ligaments exam limited due to pain Hamstring and quadriceps strength is normal.  EXT: additionally has tenderness to palpation over the hamstring, quadriceps, and calf bilaterally  INJECTION: Patient was given informed consent, signed copy in the chart. Appropriate time out was taken. Area prepped and draped in usual sterile fashion. 1 cc of methylprednisolone 40 mg/ml plus  4 cc of 1% lidocaine without epinephrine was injected into the right knee using a(n) anterior medial approach. The patient tolerated the procedure well. There were no complications. Post procedure instructions were given.  Assessment and plan:  Osteoarthritis of both knees Given tenderness in all large muscle groups, will check a CK in the office today. Patient is on a statin. She is not interested in joint replacement surgery. - no red flags for joint infection - 40 mg solumedrol right knee joint injection today in the office - meloxicam qd (it is on beers list, but patient had normal EGD in 01/2018  and she has significant limitations in ADLs due to pain) - voltaren gel PRN - encourage continued movement at the joint - return precautions provided - follow up as needed     Orders Placed This Encounter  Procedures  . CK    Meds ordered this encounter  Medications  . diclofenac sodium (VOLTAREN) 1 % GEL    Sig: Apply 2 g topically 4 (four) times daily.    Dispense:  100 g    Refill:  0  . meloxicam (MOBIC) 15 MG tablet    Sig: Take 1 tablet (15 mg total) by mouth daily.    Dispense:  30 tablet    Refill:  0     Everrett Coombe, MD,MS,  PGY3 06/16/2018 3:36 PM

## 2018-06-17 ENCOUNTER — Encounter (HOSPITAL_COMMUNITY): Payer: Self-pay | Admitting: Student in an Organized Health Care Education/Training Program

## 2018-06-17 LAB — CK: Total CK: 59 U/L (ref 24–173)

## 2018-07-28 ENCOUNTER — Other Ambulatory Visit: Payer: Self-pay | Admitting: Family Medicine

## 2018-07-30 DIAGNOSIS — M48061 Spinal stenosis, lumbar region without neurogenic claudication: Secondary | ICD-10-CM | POA: Diagnosis not present

## 2018-07-31 ENCOUNTER — Other Ambulatory Visit: Payer: Self-pay | Admitting: Family Medicine

## 2018-09-01 ENCOUNTER — Other Ambulatory Visit: Payer: Self-pay

## 2018-09-01 ENCOUNTER — Telehealth (INDEPENDENT_AMBULATORY_CARE_PROVIDER_SITE_OTHER): Payer: Medicare Other | Admitting: Family Medicine

## 2018-09-01 DIAGNOSIS — R519 Headache, unspecified: Secondary | ICD-10-CM

## 2018-09-01 DIAGNOSIS — R51 Headache: Secondary | ICD-10-CM

## 2018-09-01 NOTE — Assessment & Plan Note (Addendum)
Sudden onset headache with resultant slurred speech extremely concerning for new stroke, headache now resolved.  No other neurological deficits appreciated on very limited exam given telemedicine video visit.  No history of recent falls or head trauma to concern for ongoing hemorrhage.  Patient has multiple risk factors including 2 prior strokes, hypertension, hyperlipidemia on a statin, not anticoagulated.  Advised patient to present to ED for cranial imaging, especially given prior mild Chiari malformation noted on imaging, and further work-up and treatment.  Patient is adamant she does not want to go to the ED due to the coronavirus outbreak but daughter states she will try to get the patient to go.

## 2018-09-01 NOTE — Progress Notes (Signed)
[place] Telemedicine Visit  Patient consented to have virtual visit. Method of visit: Video  Encounter participants: Patient: Margaret Rangel - located at home Provider: Rory Percy - located at [facility] Others (if applicable): daughter  Chief Complaint: slurred speech, headache  HPI:  Daughter states patient sustained sudden onset headache on the left parietal area of her head and then subsequently developed slurred speech and drooling from the side of her mouth.  Daughter states she is still able to walk normally and use all extremities normally.  States EMS evaluated her and said that she was not having a stroke.  She has a history of 2 prior strokes about 6-7 years ago, patient states she was not started on medication after this.  Per chart review, she was taking baby aspirin up until she had a GI bleed in 2014.  She is taking a statin regularly.  She is eating and drinking okay.  She is breathing normally.  Denies falls or head trauma.  Per chart review, she had an MRI C-spine in 2012 that showed mild cerebellar tonsillar herniation consistent with a mild Chiari malformation.  ROS: per HPI  Pertinent PMHx: HTN, asthma, dysphagia s/p esophageal stricture dilation, GAD  Exam:  Respiratory: Normal work of breathing on room air Neuro: Alert and oriented, speech slurred.  Tongue protrudes normally with no deviation.  Smile symmetric without facial droop.   Assessment/Plan:  Headache, acute Sudden onset headache with resultant slurred speech extremely concerning for new stroke, headache now resolved.  No other neurological deficits appreciated on very limited exam given telemedicine video visit.  No history of recent falls or head trauma to concern for ongoing hemorrhage.  Patient has multiple risk factors including 2 prior strokes, hypertension, hyperlipidemia on a statin, not anticoagulated.  Advised patient to present to ED for cranial imaging, especially given  prior mild Chiari malformation noted on imaging, and further work-up and treatment.  Patient is adamant she does not want to go to the ED due to the coronavirus outbreak but daughter states she will try to get the patient to go.    Time spent during visit with patient: 10 minutes

## 2018-09-02 ENCOUNTER — Emergency Department (HOSPITAL_COMMUNITY)
Admission: EM | Admit: 2018-09-02 | Discharge: 2018-09-02 | Discharge status: Against Medical Advice | Payer: Medicare Other | Attending: Emergency Medicine | Admitting: Emergency Medicine

## 2018-09-02 ENCOUNTER — Other Ambulatory Visit: Payer: Self-pay

## 2018-09-02 ENCOUNTER — Emergency Department (HOSPITAL_COMMUNITY): Payer: Medicare Other

## 2018-09-02 ENCOUNTER — Encounter (HOSPITAL_COMMUNITY): Payer: Self-pay | Admitting: Emergency Medicine

## 2018-09-02 DIAGNOSIS — Z1159 Encounter for screening for other viral diseases: Secondary | ICD-10-CM | POA: Diagnosis not present

## 2018-09-02 DIAGNOSIS — R29818 Other symptoms and signs involving the nervous system: Secondary | ICD-10-CM | POA: Diagnosis not present

## 2018-09-02 DIAGNOSIS — E119 Type 2 diabetes mellitus without complications: Secondary | ICD-10-CM | POA: Diagnosis not present

## 2018-09-02 DIAGNOSIS — Z87891 Personal history of nicotine dependence: Secondary | ICD-10-CM | POA: Insufficient documentation

## 2018-09-02 DIAGNOSIS — Z8673 Personal history of transient ischemic attack (TIA), and cerebral infarction without residual deficits: Secondary | ICD-10-CM | POA: Diagnosis not present

## 2018-09-02 DIAGNOSIS — I10 Essential (primary) hypertension: Secondary | ICD-10-CM | POA: Diagnosis not present

## 2018-09-02 DIAGNOSIS — Z79899 Other long term (current) drug therapy: Secondary | ICD-10-CM | POA: Insufficient documentation

## 2018-09-02 DIAGNOSIS — I63422 Cerebral infarction due to embolism of left anterior cerebral artery: Secondary | ICD-10-CM | POA: Diagnosis not present

## 2018-09-02 DIAGNOSIS — Z5321 Procedure and treatment not carried out due to patient leaving prior to being seen by health care provider: Secondary | ICD-10-CM | POA: Insufficient documentation

## 2018-09-02 DIAGNOSIS — R4781 Slurred speech: Secondary | ICD-10-CM | POA: Diagnosis not present

## 2018-09-02 DIAGNOSIS — Z7984 Long term (current) use of oral hypoglycemic drugs: Secondary | ICD-10-CM | POA: Insufficient documentation

## 2018-09-02 DIAGNOSIS — I639 Cerebral infarction, unspecified: Secondary | ICD-10-CM | POA: Diagnosis not present

## 2018-09-02 LAB — COMPREHENSIVE METABOLIC PANEL
ALT: 29 U/L (ref 0–44)
AST: 28 U/L (ref 15–41)
Albumin: 4.5 g/dL (ref 3.5–5.0)
Alkaline Phosphatase: 66 U/L (ref 38–126)
Anion gap: 12 (ref 5–15)
BUN: 15 mg/dL (ref 8–23)
CO2: 22 mmol/L (ref 22–32)
Calcium: 9.5 mg/dL (ref 8.9–10.3)
Chloride: 106 mmol/L (ref 98–111)
Creatinine, Ser: 1.04 mg/dL — ABNORMAL HIGH (ref 0.44–1.00)
GFR calc Af Amer: >60 mL/min (ref >60)
GFR calc non Af Amer: 53 mL/min — ABNORMAL LOW (ref >60)
Glucose, Bld: 175 mg/dL — ABNORMAL HIGH (ref 70–99)
Potassium: 3.3 mmol/L — ABNORMAL LOW (ref 3.5–5.1)
Sodium: 140 mmol/L (ref 135–145)
Total Bilirubin: 1 mg/dL (ref 0.3–1.2)
Total Protein: 8.1 g/dL (ref 6.5–8.1)

## 2018-09-02 LAB — CBC
HCT: 45.5 % (ref 36.0–46.0)
Hemoglobin: 15.2 g/dL — ABNORMAL HIGH (ref 12.0–15.0)
MCH: 28.6 pg (ref 26.0–34.0)
MCHC: 33.4 g/dL (ref 30.0–36.0)
MCV: 85.5 fL (ref 80.0–100.0)
Platelets: 292 10*3/uL (ref 150–400)
RBC: 5.32 MIL/uL — ABNORMAL HIGH (ref 3.87–5.11)
RDW: 13.5 % (ref 11.5–15.5)
WBC: 8.3 10*3/uL (ref 4.0–10.5)
nRBC: 0 % (ref 0.0–0.2)

## 2018-09-02 LAB — I-STAT CHEM 8, ED
BUN: 14 mg/dL (ref 8–23)
Calcium, Ion: 1.15 mmol/L (ref 1.15–1.40)
Chloride: 105 mmol/L (ref 98–111)
Creatinine, Ser: 0.9 mg/dL (ref 0.44–1.00)
Glucose, Bld: 182 mg/dL — ABNORMAL HIGH (ref 70–99)
HCT: 47 % — ABNORMAL HIGH (ref 36.0–46.0)
Hemoglobin: 16 g/dL — ABNORMAL HIGH (ref 12.0–15.0)
Potassium: 3.1 mmol/L — ABNORMAL LOW (ref 3.5–5.1)
Sodium: 140 mmol/L (ref 135–145)
TCO2: 24 mmol/L (ref 22–32)

## 2018-09-02 LAB — URINALYSIS, ROUTINE W REFLEX MICROSCOPIC
Bilirubin Urine: NEGATIVE
Glucose, UA: NEGATIVE mg/dL
Hgb urine dipstick: NEGATIVE
Ketones, ur: NEGATIVE mg/dL
Nitrite: NEGATIVE
Protein, ur: 30 mg/dL — AB
Specific Gravity, Urine: 1.009 (ref 1.005–1.030)
pH: 5 (ref 5.0–8.0)

## 2018-09-02 LAB — DIFFERENTIAL
Abs Immature Granulocytes: 0.04 10*3/uL (ref 0.00–0.07)
Basophils Absolute: 0 10*3/uL (ref 0.0–0.1)
Basophils Relative: 1 %
Eosinophils Absolute: 0.2 10*3/uL (ref 0.0–0.5)
Eosinophils Relative: 2 %
Immature Granulocytes: 1 %
Lymphocytes Relative: 49 %
Lymphs Abs: 4.2 10*3/uL — ABNORMAL HIGH (ref 0.7–4.0)
Monocytes Absolute: 0.7 10*3/uL (ref 0.1–1.0)
Monocytes Relative: 9 %
Neutro Abs: 3.2 10*3/uL (ref 1.7–7.7)
Neutrophils Relative %: 38 %

## 2018-09-02 LAB — RAPID URINE DRUG SCREEN, HOSP PERFORMED
Amphetamines: NOT DETECTED
Barbiturates: NOT DETECTED
Benzodiazepines: NOT DETECTED
Cocaine: NOT DETECTED
Opiates: NOT DETECTED
Tetrahydrocannabinol: POSITIVE — AB

## 2018-09-02 LAB — PROTIME-INR
INR: 0.9 (ref 0.8–1.2)
Prothrombin Time: 12.5 seconds (ref 11.4–15.2)

## 2018-09-02 LAB — APTT: aPTT: 28 seconds (ref 24–36)

## 2018-09-02 LAB — SARS CORONAVIRUS 2 BY RT PCR (HOSPITAL ORDER, PERFORMED IN ~~LOC~~ HOSPITAL LAB): SARS Coronavirus 2: NEGATIVE

## 2018-09-02 LAB — ETHANOL: Alcohol, Ethyl (B): <10 mg/dL (ref <10)

## 2018-09-02 MED ORDER — POTASSIUM CHLORIDE CRYS ER 20 MEQ PO TBCR
40.0000 meq | EXTENDED_RELEASE_TABLET | Freq: Once | ORAL | Status: AC
  Administered 2018-09-02: 40 meq via ORAL
  Filled 2018-09-02: qty 2

## 2018-09-02 MED ORDER — MAGNESIUM OXIDE 400 (241.3 MG) MG PO TABS
800.0000 mg | ORAL_TABLET | Freq: Once | ORAL | Status: AC
  Administered 2018-09-02: 800 mg via ORAL
  Filled 2018-09-02: qty 2

## 2018-09-02 NOTE — ED Notes (Signed)
Per MRI, she is next on their list for testing

## 2018-09-02 NOTE — ED Notes (Signed)
ED Provider at bedside. 

## 2018-09-02 NOTE — Discharge Instructions (Signed)
Please follow-up with your family doctor and the neurologist for this.  I have tried to get home health to come and evaluate her for speech and language as well as physical therapy for her stroke.  If at any point you wish to return and have the work-up completed in the hospital please return so that she can be admitted to have a further evaluation of the arteries that go into her head and neck as well as an ultrasound of her heart.

## 2018-09-02 NOTE — ED Provider Notes (Signed)
[facility] DEPT Provider Note   CSN: [id number] Arrival date & time: 09/02/18  0911    History   Chief Complaint Chief Complaint  Patient presents with  . Aphasia    HPI Margaret Rangel is a 73 y.o. female.     73 yo F with a chief complaint of a left-sided headache and slurred speech.  This started about 5 days ago.  Has gotten progressively better.  She denies unilateral weakness or numbness denies difficulty with swallowing.  Has been having trouble enunciating.  Had a stroke at least 10 years ago where she thinks it was on the same side.  She denies cough congestion or fever denies recent medication change denies head trauma.  She had a virtual visit yesterday and they instructed her to come to the ED.  The history is provided by the patient.  Illness  Severity:  Severe Onset quality:  Sudden Duration:  5 days Timing:  Constant Progression:  Partially resolved Chronicity:  New Associated symptoms: headaches   Associated symptoms: no chest pain, no congestion, no fever, no myalgias, no nausea, no rhinorrhea, no shortness of breath, no vomiting and no wheezing     Past Medical History:  Diagnosis Date  . Abdominal pain   . Anal fissure   . Arthritis   . Asthma   . Chronic back pain   . Colon adenoma 2009, 2012   Colonoscopy  . Diverticulosis of colon (without mention of hemorrhage) 2009   Colonoscopy  . DM (diabetes mellitus) ([place])    off medicines for diabetes 12-18-13  . GERD (gastroesophageal reflux disease)   . Hemorrhoids   . Hernia   . HTN (hypertension)   . Hyperlipidemia    low  . Irritable bowel syndrome (IBS)   . Low blood potassium   . Obese   . Stroke ([place])    x2  . Ulcer     Patient Active Problem List   Diagnosis Date Noted  . Headache, acute 09/01/2018  . Osteoarthritis of both knees 01/23/2018  . Shoulder pain, bilateral 01/23/2018  . Chest pain 11/08/2017  . Abscess of female genitalia 05/03/2017  .  GAD (generalized anxiety disorder) 11/22/2016  . Seasonal allergies 11/22/2016  . Asthma, chronic 11/22/2016  . History of stroke 09/13/2016  . Diverticulosis of colon 09/13/2016  . Osteoarthritis of right hip 05/29/2016  . Osteoporosis 05/01/2016  . Systolic murmur 45/80/9983  . Essential hypertension 04/02/2016  . Chest pain with moderate risk for cardiac etiology 03/07/2016  . History of esophageal stricture 03/01/2016  . Rectal bleeding 02/02/2014  . Esophageal reflux 02/02/2014  . Personal history of colonic polyps 12/09/2013  . Dysphagia, unspecified(787.20) 09/11/2012  . Nausea alone 09/11/2012  . Diabetes mellitus without mention of complication 38/25/0539    Past Surgical History:  Procedure Laterality Date  . COLONOSCOPY    . FLEXIBLE SIGMOIDOSCOPY N/A 07/14/2012   Procedure: FLEXIBLE SIGMOIDOSCOPY;  Surgeon: Inda Castle, MD;  Location: [facility] ENDOSCOPY;  Service: Endoscopy;  Laterality: N/A;  . HEMORRHOID BANDING N/A 07/14/2012   Procedure: HEMORRHOID BANDING;  Surgeon: Inda Castle, MD;  Location: [facility] ENDOSCOPY;  Service: Endoscopy;  Laterality: N/A;  . PARTIAL HYSTERECTOMY  1971  . POLYPECTOMY    . TONSILLECTOMY       OB History   No obstetric history on file.      Home Medications    Prior to Admission medications   Medication Sig Start Date End Date Taking? Authorizing Provider  albuterol (PROVENTIL HFA;VENTOLIN HFA) 108 (90 Base) MCG/ACT inhaler Inhale 2 puffs into the lungs every 6 (six) hours as needed for wheezing or shortness of breath. 06/16/18   Everrett Coombe, MD  aspirin (EQ ASPIRIN ADULT LOW DOSE) 81 MG EC tablet Take 1 tablet (81 mg total) by mouth daily. Patient not taking: Reported on 02/13/2018 02/14/17   Smiley Houseman, MD  benzonatate (TESSALON PERLES) 100 MG capsule Take 1 capsule (100 mg total) by mouth 3 (three) times daily as needed for cough. 05/13/17   Smiley Houseman, MD  Blood Glucose Calibration (ONETOUCH VERIO) High SOLN 1  each by In Vitro route as directed. 12/26/16   Dickie La, MD  Blood Glucose Calibration (ONETOUCH VERIO) SOLN 1 Bottle by In Vitro route as directed. 12/26/16   Dickie La, MD  Blood Glucose Monitoring Suppl (ONETOUCH VERIO) w/Device KIT 1 kit by Does not apply route 3 (three) times daily. ICD-10 code: E11.9 12/26/16   Dickie La, MD  busPIRone (BUSPAR) 5 MG tablet Take 1 tablet (5 mg total) by mouth 2 (two) times daily. 11/22/16   Smiley Houseman, MD  Calcium Carb-Cholecalciferol 600-800 MG-UNIT TABS Take one tablet daily 06/17/17   Smiley Houseman, MD  carvedilol (COREG) 3.125 MG tablet TAKE 1 TABLET BY MOUTH TWICE DAILY Patient taking differently: Take 3.125 mg by mouth 2 (two) times daily with a meal.  05/14/18   Diallo, Abdoulaye, MD  diclofenac sodium (VOLTAREN) 1 % GEL Apply 2 g topically 4 (four) times daily. 06/16/18   Everrett Coombe, MD  dicyclomine (BENTYL) 20 MG tablet TAKE 1 TABLET BY MOUTH THREE TIMES DAILY AS NEEDED FOR SPASM Patient taking differently: Take 20 mg by mouth 3 (three) times daily as needed for spasms. TAKE 1 TABLET BY MOUTH THREE TIMES DAILY AS NEEDED FOR SPASM 12/12/17   Diallo, Earna Coder, MD  docusate sodium (COLACE) 100 MG capsule Take 1 capsule (100 mg total) by mouth every 12 (twelve) hours. 06/24/17   Sherwood Gambler, MD  DULoxetine (CYMBALTA) 30 MG capsule Take 1 capsule (30 mg total) by mouth daily. 07/16/17   Smiley Houseman, MD  Ferrous Sulfate (IRON) 325 (65 Fe) MG TABS TAKE 1 TABLET BY MOUTH TWICE DAILY WITH  A  MEAL Patient taking differently: Take 325 mg by mouth 2 (two) times daily with a meal.  05/14/18   Diallo, Earna Coder, MD  fexofenadine (ALLEGRA ALLERGY) 60 MG tablet Take 1 tablet (60 mg total) by mouth 2 (two) times daily. 11/22/16   Smiley Houseman, MD  gabapentin (NEURONTIN) 100 MG capsule Take 1 capsule (100 mg total) by mouth at bedtime. If tolerating, increase to 1 tablet three times a day 08/13/17   Smiley Houseman, MD  glimepiride  (AMARYL) 2 MG tablet Take 1 tablet by mouth once daily with breakfast Patient taking differently: Take 2 mg by mouth daily with breakfast. TAKE 1 TABLET BY MOUTH ONCE DAILY WITH BREAKFAST 07/31/18   Diallo, Abdoulaye, MD  glucose blood (ONETOUCH VERIO) test strip Use as instructed to test glucose level three times daily. ICD-10 code: E11.9 06/16/18   Everrett Coombe, MD  Lancet Devices (ONE TOUCH DELICA LANCING DEV) MISC 1 Device by Does not apply route as directed. ICD-10 code: E11.9 12/26/16   Dickie La, MD  meloxicam (MOBIC) 15 MG tablet Take 1 tablet (15 mg total) by mouth daily. 06/16/18   Everrett Coombe, MD  [facility] DELICA LANCETS 90X MISC 1 each by Does not apply  route 3 (three) times daily. ICD-10 code: E11.9 12/26/16   Dickie La, MD  pantoprazole (PROTONIX) 40 MG tablet Take 1 tablet by mouth daily Patient taking differently: Take 40 mg by mouth daily.  07/28/18   Diallo, Earna Coder, MD  polyethylene glycol (MIRALAX / GLYCOLAX) packet Take 17 g by mouth daily. 06/24/17   Sherwood Gambler, MD  rosuvastatin (CRESTOR) 40 MG tablet Take 1 tablet (40 mg total) by mouth daily. Please make overdue yearly appt with Dr. Curt Bears. 2nd attempt 06/20/17   Constance Haw, MD  traMADol (ULTRAM) 50 MG tablet Take 1 tablet (50 mg total) by mouth every 12 (twelve) hours as needed for moderate pain. 01/23/18   Shirley, [place], DO    Family History Family History  Problem Relation Age of Onset  . Prostate cancer Brother   . Heart disease Mother   . Anuerysm Father   . Lung cancer Brother   . Colon cancer Brother   . Rectal cancer Neg Hx   . Stomach cancer Neg Hx   . Esophageal cancer Neg Hx     Social History Social History   Tobacco Use  . Smoking status: Former Smoker    Packs/day: 0.25    Years: 39.00    Pack years: 9.75    Types: Cigarettes    Start date: 04/16/1976    Last attempt to quit: 02/13/2016    Years since quitting: 2.5  . Smokeless tobacco: Never Used  . Tobacco comment: 4-5 cigs  per day for 39 years  Substance Use Topics  . Alcohol use: Yes    Alcohol/week: 21.0 standard drinks    Types: 21 Cans of beer per week    Comment: quit 2 years ago  . Drug use: Yes    Types: Marijuana    Comment: using for depression// last used this morning 4am     Allergies   Codeine and Penicillins   Review of Systems Review of Systems  Constitutional: Negative for chills and fever.  HENT: Negative for congestion and rhinorrhea.   Eyes: Negative for redness and visual disturbance.  Respiratory: Negative for shortness of breath and wheezing.   Cardiovascular: Negative for chest pain and palpitations.  Gastrointestinal: Negative for nausea and vomiting.  Genitourinary: Negative for dysuria and urgency.  Musculoskeletal: Negative for arthralgias and myalgias.  Skin: Negative for pallor and wound.  Neurological: Positive for speech difficulty and headaches. Negative for dizziness.     Physical Exam Updated Vital Signs BP 121/82   Pulse 77   Temp 98.5 F (36.9 C) (Oral)   Resp 18   SpO2 100%   Physical Exam Vitals signs and nursing note reviewed.  Constitutional:      General: She is not in acute distress.    Appearance: She is well-developed. She is not diaphoretic.  HENT:     Head: Normocephalic and atraumatic.  Eyes:     Pupils: Pupils are equal, round, and reactive to light.  Neck:     Musculoskeletal: Normal range of motion and neck supple.  Cardiovascular:     Rate and Rhythm: Normal rate and regular rhythm.     Heart sounds: No murmur. No friction rub. No gallop.   Pulmonary:     Effort: Pulmonary effort is normal.     Breath sounds: No wheezing or rales.  Abdominal:     General: There is no distension.     Palpations: Abdomen is soft.     Tenderness: There is no abdominal tenderness.  Musculoskeletal:        General: No tenderness.  Skin:    General: Skin is warm and dry.  Neurological:     Mental Status: She is alert and oriented to person,  place, and time.     Motor: Motor function is intact.     Coordination: Coordination is intact.     Gait: Gait is intact.     Comments: Subjective decrease sensation to light touch to the left side of the face.  No appreciable facial droop.  Palate seems to elevate symmetrically.  Psychiatric:        Behavior: Behavior normal.      ED Treatments / Results  Labs (all labs ordered are listed, but only abnormal results are displayed) Labs Reviewed  CBC - Abnormal; Notable for the following components:      Result Value   RBC 5.32 (*)    Hemoglobin 15.2 (*)    All other components within normal limits  DIFFERENTIAL - Abnormal; Notable for the following components:   Lymphs Abs 4.2 (*)    All other components within normal limits  COMPREHENSIVE METABOLIC PANEL - Abnormal; Notable for the following components:   Potassium 3.3 (*)    Glucose, Bld 175 (*)    Creatinine, Ser 1.04 (*)    GFR calc non Af Amer 53 (*)    All other components within normal limits  RAPID URINE DRUG SCREEN, HOSP PERFORMED - Abnormal; Notable for the following components:   Tetrahydrocannabinol POSITIVE (*)    All other components within normal limits  URINALYSIS, ROUTINE W REFLEX MICROSCOPIC - Abnormal; Notable for the following components:   APPearance HAZY (*)    Protein, ur 30 (*)    Leukocytes,Ua MODERATE (*)    Bacteria, UA MANY (*)    All other components within normal limits  I-STAT CHEM 8, ED - Abnormal; Notable for the following components:   Potassium 3.1 (*)    Glucose, Bld 182 (*)    Hemoglobin 16.0 (*)    HCT 47.0 (*)    All other components within normal limits  SARS CORONAVIRUS 2 (HOSPITAL ORDER, [facility])  ETHANOL  PROTIME-INR  APTT    EKG None  Radiology Ct Head Wo Contrast  Result Date: 09/02/2018 CLINICAL DATA:  Slurred speech.  Focal neuro deficit rule out stroke EXAM: CT HEAD WITHOUT CONTRAST TECHNIQUE: Contiguous axial images were obtained  from the base of the skull through the vertex without intravenous contrast. COMPARISON:  CT head 08/25/2009 FINDINGS: Brain: Hypodensity left frontal lobe is ill-defined and suspicious for acute or subacute infarct. Hypodensity in the left basal ganglia and occluding the anterior limb internal capsule and caudate consistent with infarct. This was not present previously and could be chronic or subacute. Hypodensity in the right anterior basal ganglia is also new and could be a subacute or chronic infarct. Ventricle size normal. Chronic microvascular ischemic changes in the white matter. No acute hemorrhage or mass Vascular: Negative for hyperdense vessel Skull: Negative Sinuses/Orbits: Paranasal sinuses clear. Bilateral cataract surgery. Other: None IMPRESSION: Hypodensity left frontal lobe suspicious for acute infarct. Bilateral basal ganglia infarcts of indeterminate age possibly subacute or chronic. MRI recommended for further evaluation. No acute hemorrhage. Electronically Signed   By: Franchot Gallo M.D.   On: 09/02/2018 10:32   Mr Brain Wo Contrast  Result Date: 09/02/2018 CLINICAL DATA:  Initial evaluation for acute slurred speech, facial weakness. EXAM: MRI HEAD WITHOUT CONTRAST TECHNIQUE: Multiplanar, multiecho pulse  sequences of the brain and surrounding structures were obtained without intravenous contrast. COMPARISON:  Prior CT from 09/02/2018. FINDINGS: Brain: Generalized age-related cerebral atrophy. Patchy and confluent T2/FLAIR hyperintensity within the periventricular and deep white matter both cerebral hemispheres, moderate nature. Superimposed remote lacunar infarcts present within the bilateral basal ganglia, thalami, and pons. Small amount of chronic hemosiderin staining noted at the left basal ganglia. Patchy cortical and subcortical acute ischemic infarcts seen involving the left frontal lobe, with patchy involvement of the left frontal operculum (series 4, image 34). Mild subcortical  involvement more anteriorly within the anterior left frontal lobe (series 4, image 32). No associated hemorrhage or mass effect. No other evidence for acute or subacute ischemia. Gray-white matter differentiation otherwise maintained. No other areas of remote cortical infarction. No other evidence for acute or chronic intracranial hemorrhage. No mass lesion, midline shift or mass effect. Ventricles normal size without hydrocephalus. No extra-axial fluid collection. Pituitary gland suprasellar region within normal limits. Midline structures intact. Vascular: Major intracranial vascular flow voids are maintained. Skull and upper cervical spine: Craniocervical junction within normal limits. 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 Bone marrow signal intensity normal. No scalp soft tissue abnormality. Sinuses/Orbits: Patient status post bilateral ocular lens replacement. Paranasal sinuses are largely clear. No significant mastoid effusion. Inner ear structures grossly normal. Other: None. IMPRESSION: 1. Patchy small volume cortical and subcortical acute ischemic infarcts involving the anterior left frontal lobe as above. No associated hemorrhage or mass effect. 2. Underlying moderate chronic microvascular ischemic disease with multiple remote lacunar infarcts involving the bilateral basal ganglia, thalami, and pons. Electronically Signed   By: Jeannine Boga M.D.   On: 09/02/2018 15:48    Procedures Procedures (including critical care time)  Medications Ordered in ED Medications  potassium chloride SA (K-DUR) CR tablet 40 mEq (40 mEq Oral Given 09/02/18 1054)  magnesium oxide (MAG-OX) tablet 800 mg (800 mg Oral Given 09/02/18 1054)     Initial Impression / Assessment and Plan / ED Course  I have reviewed the triage vital signs and the nursing notes.  Pertinent labs & imaging results that were available during my care of the patient were  reviewed by me and considered in my medical decision making (see chart for details).        73 yo F with a chief complaint of a left-sided headache and left-sided decreased sensation to the face and slurred speech.  Going on for about 4 or 5 days.  Most likely I think the patient has had a stroke and I discussed this with her.  I told her that this would likely put her into the hospital for a couple days and she became very agitated and told me that she would not like to come into the hospital.  Will obtain a laboratory evaluation CT of the head discuss with neurology.  I discussed the case with Dr. Malen Gauze, he recommended performing an MRI and then we would rediscuss the case.  Her potassium is mildly low we will replenish.  Alcohol is negative seems somewhat hemoconcentrated based on the CBC.  Her urine appears to be contaminated.  MRI of the brain is consistent with multiple small strokes to the left frontal region.  I discussed the case with Dr. Malen Gauze at this point with her having acute stroke he felt that she required evaluation by neurologist and that she likely needs admission for imaging of the carotid arteries and an echo.  I will have her  call her family doctor to discuss antiplatelet regimen.  I have ordered her an urgent follow-up with the neurologist.  I have offered for her to return to the emergency department at any time that she wishes to be admitted.  4:09 PM:  I have discussed the diagnosis/risks/treatment options with the patient and family and believe the pt to be eligible for discharge home to follow-up with PCP, neuro. We also discussed returning to the ED immediately if new or worsening sx occur. We discussed the sx which are most concerning (e.g., sudden worsening pain, fever, inability to tolerate by mouth) that necessitate immediate return. Medications administered to the patient during their visit and any new prescriptions provided to the patient are listed below.   Medications given during this visit Medications  potassium chloride SA (K-DUR) CR tablet 40 mEq (40 mEq Oral Given 09/02/18 1054)  magnesium oxide (MAG-OX) tablet 800 mg (800 mg Oral Given 09/02/18 1054)     The patient appears reasonably screen and/or stabilized for discharge and I doubt any other medical condition or other [facility] requiring further screening, evaluation, or treatment in the ED at this time prior to discharge.    Final Clinical Impressions(s) / ED Diagnoses   Final diagnoses:  Cerebrovascular accident (CVA) due to embolism of left anterior cerebral artery [facility])    ED Discharge Orders         Ordered    Ambulatory referral to Neurology    Comments:  Acute stroke unwilling to stay in the hospital   09/02/18 [place], Robinette, DO 09/02/18 1609

## 2018-09-02 NOTE — ED Notes (Signed)
Patient transported to CT 

## 2018-09-02 NOTE — ED Notes (Signed)
Patient transported to MRI 

## 2018-09-02 NOTE — ED Notes (Signed)
Pt refusing to be admitted. Pt leaving AMA. Dr. Tyrone Nine aware. Pt leaving with daughter.

## 2018-09-02 NOTE — ED Triage Notes (Signed)
Pt had slurred speech and drooling on left side of mouth since Sunday when she was seen again.  Family states that she last saw her Saturday at 2pm and she was normal. Had virtual visit with PCP yesterday and advised to go to hospital for a scan.

## 2018-09-03 ENCOUNTER — Encounter: Payer: Self-pay | Admitting: Family Medicine

## 2018-09-03 ENCOUNTER — Other Ambulatory Visit: Payer: Self-pay

## 2018-09-03 ENCOUNTER — Ambulatory Visit (INDEPENDENT_AMBULATORY_CARE_PROVIDER_SITE_OTHER): Payer: Medicare Other | Admitting: Family Medicine

## 2018-09-03 VITALS — BP 150/62 | HR 106 | Ht 62.0 in | Wt 153.1 lb

## 2018-09-03 DIAGNOSIS — E118 Type 2 diabetes mellitus with unspecified complications: Secondary | ICD-10-CM | POA: Diagnosis not present

## 2018-09-03 DIAGNOSIS — R7309 Other abnormal glucose: Secondary | ICD-10-CM | POA: Diagnosis not present

## 2018-09-03 DIAGNOSIS — Z8673 Personal history of transient ischemic attack (TIA), and cerebral infarction without residual deficits: Secondary | ICD-10-CM | POA: Diagnosis not present

## 2018-09-03 LAB — POCT GLYCOSYLATED HEMOGLOBIN (HGB A1C): HbA1c, POC (controlled diabetic range): 7.4 % — AB (ref 0.0–7.0)

## 2018-09-03 MED ORDER — IRON 325 (65 FE) MG PO TABS: 325.0000 mg | ORAL_TABLET | Freq: Two times a day (BID) | ORAL | 0 refills | Status: DC

## 2018-09-03 MED ORDER — ASPIRIN 81 MG PO TBEC: 81.0000 mg | DELAYED_RELEASE_TABLET | Freq: Every day | ORAL | 3 refills | Status: DC

## 2018-09-03 NOTE — Progress Notes (Signed)
Subjective:   Patient ID: Margaret Rangel    DOB: [birth year], 73 y.o. female   MRN: [id number]  Margaret Rangel is a 73 y.o. female with a history of HTN, asthma, GERD, DM2, osteoporosis, OA, GERD w/ esophageal stricture, GAD, h/o stroke, seasonal allergies here for   Stroke follow up - virtual visit 5/18 for sudden onset L sided headache with resultant slurring of speech, with prior h/o stroke and not on anticoagulation, instructed to go to ED. - seen in ED 5/19. MRI brain with multiple small strokes to L frontal region. Discussed with neuro who recommended admission for further imaging. Patient refused and elected for outpt f/u. - not taking ASA or other anticoagulation d/t GI bleed in 2014 - on statin  - daughter feels she is mentating normally. - some h/o dark stools but none recently. Colonoscopy with diverticuli and internal hemorrhoids 01/2018 - checks BP at home, 150s SBP - CBGs 120s  Accompanied by daughter who aided in history taking.   Review of Systems:  Per HPI.  [name], medications and smoking status reviewed.  Objective:   BP (!) 150/62   Pulse (!) 106   Ht 5\' 2"  (1.575 m)   Wt 153 lb 2 oz (69.5 kg)   SpO2 98%   BMI 28.01 kg/m  Vitals and nursing note reviewed.  General: overweight elderly female, in no acute distress with non-toxic appearance HEENT: normocephalic, atraumatic, moist mucous membranes CV: regular rate and rhythm  Lungs: clear to auscultation bilaterally with normal work of breathing Skin: warm, dry, no rashes or lesions MSK: ROM grossly intact, strength 5/5 to U/LE bilaterally, normal gait.  Neuro: Alert and oriented, speech incomprehensible. Extraocular movements intact.  Intact symmetric sensation to light touch of face and extremities bilaterally.  Hearing grossly intact bilaterally.  Tongue protrudes normally with no deviation.  Shoulder shrug, smile symmetric.   Assessment & Plan:   History of stroke Multiple new acute L frontal strokes  visualized on MRI. Patient adamantly refuses admission to the hospital for further workup and risk mitigation. Outpt neuro referral placed by ED, per notes, are planning on obtaining ECHO and carotid US. Regular cardiac rhythm appreciated on exam today but may benefit from loop recorder to assess for possible inconspicuous paroxsymal afib. Is due for Cardiology f/u anyway. Will order lipid panel (pt is fasting), A1c and place SLP referral. SBP slightly high but diastolics remain on the lower end, so will not adjust BP meds now. After risk benefit discussion, will restart ASA 81mg  with instructions to stop it and call us if she has return of GI bleeding.   Orders Placed This Encounter  Procedures  . Lipid Panel  . Ambulatory referral to Speech Therapy    Referral Priority:   Routine    Referral Type:   Speech Therapy    Referral Reason:   Specialty Services Required    Requested Specialty:   Speech Pathology    Number of Visits Requested:   1  . HgB A1c   Meds ordered this encounter  Medications  . aspirin (EQ ASPIRIN ADULT LOW DOSE) 81 MG EC tablet    Sig: Take 1 tablet (81 mg total) by mouth daily.    Dispense:  90 tablet    Refill:  3    Please consider 90 day supplies to promote better adherence  . Ferrous Sulfate (IRON) 325 (65 Fe) MG TABS    Sig: Take 1 tablet (325 mg total) by mouth 2 (two) times daily  with a meal.    Dispense:  90 tablet    Refill:  0    Rory Percy, DO PGY-2, [facility] 09/03/2018 6:30 PM

## 2018-09-03 NOTE — Assessment & Plan Note (Addendum)
Multiple new acute L frontal strokes visualized on MRI. Patient adamantly refuses admission to the hospital for further workup and risk mitigation. Outpt neuro referral placed by ED, per notes, are planning on obtaining ECHO and carotid US. Regular cardiac rhythm appreciated on exam today but may benefit from loop recorder to assess for possible inconspicuous paroxsymal afib. Is due for Cardiology f/u anyway. Will order lipid panel (pt is fasting), A1c and place SLP referral. SBP slightly high but diastolics remain on the lower end, so will not adjust BP meds now. After risk benefit discussion, will restart ASA 81mg  with instructions to stop it and call us if she has return of GI bleeding.

## 2018-09-03 NOTE — Patient Instructions (Addendum)
It was great to see you!  Our plans for today:  - We are checking some labs today, we will call you or send you a letter with these results.  - We are referring you to Speech Therapy. - Follow up with Neurology. If you have not heard about an appointment by early next week, call their office.  - We are starting aspirin. - call Cardiology, Dr. Curt Bears, for a check up.  Take care and seek immediate care sooner if you develop any concerns.   Dr. [facility]

## 2018-09-04 ENCOUNTER — Telehealth: Payer: Self-pay | Admitting: Family Medicine

## 2018-09-04 ENCOUNTER — Telehealth: Payer: Self-pay | Admitting: *Deleted

## 2018-09-04 LAB — LIPID PANEL
Chol/HDL Ratio: 6.7 ratio — ABNORMAL HIGH (ref 0.0–4.4)
Cholesterol, Total: 276 mg/dL — ABNORMAL HIGH (ref 100–199)
HDL: 41 mg/dL (ref >39)
LDL Calculated: 159 mg/dL — ABNORMAL HIGH (ref 0–99)
Triglycerides: 378 mg/dL — ABNORMAL HIGH (ref 0–149)
VLDL Cholesterol Cal: 76 mg/dL — ABNORMAL HIGH (ref 5–40)

## 2018-09-04 MED ORDER — EZETIMIBE 10 MG PO TABS: 10.0000 mg | ORAL_TABLET | Freq: Every day | ORAL | 3 refills | Status: DC

## 2018-09-04 MED ORDER — ROSUVASTATIN CALCIUM 40 MG PO TABS: 40.0000 mg | ORAL_TABLET | Freq: Every day | ORAL | 3 refills | Status: DC

## 2018-09-04 NOTE — Telephone Encounter (Signed)
Informed daughter of results. Given LDL >150, per pharm recs, will restart rosuvastatin (patient states she has not had in some time) AND will start zetia 10mg  daily in order to prevent future strokes. Recommend rechecking lipid panel in 3-6 months with goal LDL <70.  Rory Percy, DO PGY-2, [place] [facility] 09/04/2018 3:12 PM

## 2018-09-04 NOTE — Telephone Encounter (Signed)

## 2018-09-04 NOTE — Addendum Note (Signed)
Addended by: Myles Gip on: 09/04/2018 03:04 PM   Modules accepted: Orders

## 2018-09-04 NOTE — Addendum Note (Signed)
Addended by: Myles Gip on: 09/04/2018 03:08 PM   Modules accepted: Orders

## 2018-09-09 ENCOUNTER — Encounter: Payer: Self-pay | Admitting: Neurology

## 2018-09-10 DIAGNOSIS — I10 Essential (primary) hypertension: Secondary | ICD-10-CM | POA: Diagnosis not present

## 2018-09-10 DIAGNOSIS — M48061 Spinal stenosis, lumbar region without neurogenic claudication: Secondary | ICD-10-CM | POA: Diagnosis not present

## 2018-09-12 ENCOUNTER — Telehealth: Payer: Self-pay

## 2018-09-12 ENCOUNTER — Other Ambulatory Visit: Payer: Self-pay

## 2018-09-12 ENCOUNTER — Other Ambulatory Visit: Payer: Self-pay | Admitting: Family Medicine

## 2018-09-12 ENCOUNTER — Telehealth (INDEPENDENT_AMBULATORY_CARE_PROVIDER_SITE_OTHER): Payer: Medicare Other | Admitting: Cardiology

## 2018-09-12 ENCOUNTER — Telehealth: Payer: Self-pay | Admitting: *Deleted

## 2018-09-12 DIAGNOSIS — Z8673 Personal history of transient ischemic attack (TIA), and cerebral infarction without residual deficits: Secondary | ICD-10-CM

## 2018-09-12 DIAGNOSIS — I639 Cerebral infarction, unspecified: Secondary | ICD-10-CM | POA: Diagnosis not present

## 2018-09-12 NOTE — Telephone Encounter (Signed)
Attempted to reach pt's dtr (pt w/ recent stroke, pt asked me to call dtr), no voicemail, was unable to leave a message. Will attempt to reach again to discuss/arrange echo & carotid testing, prior to 6/9, per telemedicine visit w/ [place] today.

## 2018-09-12 NOTE — Telephone Encounter (Signed)
Patients daughter calls nurse line stating she would like her mothers SLP come to the home verses taking her somewhere. Pts daughter stated her mother is scared of COVID and gets really agitated if you mention having to leave the home. Upon review it looks like she has already spoken to someone about this, I informed her to call them first since that's where the referral has been placed.

## 2018-09-12 NOTE — Telephone Encounter (Signed)
dtr returns my call. She understands office will call her to arrange testing before 6/9. Will follow up after testing completed.

## 2018-09-12 NOTE — Telephone Encounter (Signed)
Daughter calls back- a new order needs to be put in for "home health" SLP. Please advise.

## 2018-09-12 NOTE — Progress Notes (Signed)
Electrophysiology TeleHealth Note   Due to national recommendations of social distancing due to COVID 19, an audio/video telehealth visit is felt to be most appropriate for this patient at this time.  See Epic message for the patient's consent to telehealth for [facility].   Date:  09/12/2018   ID:  Margaret Rangel, DOB 01/12/1946, MRN [id number]  Location: patient's home  Provider location: [address], [place]  Evaluation Performed: Follow-up visit  PCP:  Marjie Skiff, MD  Cardiologist:  No primary care provider on file.  Electrophysiologist:  Dr Curt Bears  Chief Complaint:  Follow-up  History of Present Illness:    Margaret Rangel is a 73 y.o. female who presents via audio/video conferencing for a telehealth visit today.  Since last being seen in our clinic, the patient reports doing very well.  Today, she denies symptoms of palpitations, chest pain, shortness of breath,  lower extremity edema, dizziness, presyncope, or syncope.  The patient is otherwise without complaint today.  The patient denies symptoms of fevers, chills, cough, or new SOB worrisome for COVID 19.  She has a history of diabetes, hypertension, hyperlipidemia.She presented to the hospital 09/02/2018 with slurred speech and headaches.  She was found to have multiple small strokes to the left frontal region.  When she presented to the emergency room, she had symptoms for 4 to 5 days.  She did not wish to come into the hospital and thus was discharged with clinic follow-up.   Past Medical History:  Diagnosis Date  . Abdominal pain   . Anal fissure   . Arthritis   . Asthma   . Chronic back pain   . Colon adenoma 2009, 2012   Colonoscopy  . Diverticulosis of colon (without mention of hemorrhage) 2009   Colonoscopy  . DM (diabetes mellitus) ([place])    off medicines for diabetes 12-18-13  . GERD (gastroesophageal reflux disease)   . Hemorrhoids   . Hernia   . HTN (hypertension)   .  Hyperlipidemia    low  . Irritable bowel syndrome (IBS)   . Low blood potassium   . Obese   . Stroke ([place])    x2  . Ulcer     Past Surgical History:  Procedure Laterality Date  . COLONOSCOPY    . FLEXIBLE SIGMOIDOSCOPY N/A 07/14/2012   Procedure: FLEXIBLE SIGMOIDOSCOPY;  Surgeon: Inda Castle, MD;  Location: [facility] ENDOSCOPY;  Service: Endoscopy;  Laterality: N/A;  . HEMORRHOID BANDING N/A 07/14/2012   Procedure: HEMORRHOID BANDING;  Surgeon: Inda Castle, MD;  Location: [facility] ENDOSCOPY;  Service: Endoscopy;  Laterality: N/A;  . PARTIAL HYSTERECTOMY  1971  . POLYPECTOMY    . TONSILLECTOMY      Current Outpatient Medications  Medication Sig Dispense Refill  . albuterol (PROVENTIL HFA;VENTOLIN HFA) 108 (90 Base) MCG/ACT inhaler Inhale 2 puffs into the lungs every 6 (six) hours as needed for wheezing or shortness of breath. 1 Inhaler 2  . aspirin (EQ ASPIRIN ADULT LOW DOSE) 81 MG EC tablet Take 1 tablet (81 mg total) by mouth daily. 90 tablet 3  . benzonatate (TESSALON PERLES) 100 MG capsule Take 1 capsule (100 mg total) by mouth 3 (three) times daily as needed for cough. 20 capsule 0  . Blood Glucose Calibration (ONETOUCH VERIO) High SOLN 1 each by In Vitro route as directed. 1 each 6  . Blood Glucose Calibration (ONETOUCH VERIO) SOLN 1 Bottle by In Vitro route as directed. 1 each 6  .  Blood Glucose Monitoring Suppl (ONETOUCH VERIO) w/Device KIT 1 kit by Does not apply route 3 (three) times daily. ICD-10 code: E11.9 1 kit 0  . busPIRone (BUSPAR) 5 MG tablet Take 1 tablet (5 mg total) by mouth 2 (two) times daily. 60 tablet 0  . Calcium Carb-Cholecalciferol 600-800 MG-UNIT TABS Take one tablet daily 60 tablet 1  . carvedilol (COREG) 3.125 MG tablet TAKE 1 TABLET BY MOUTH TWICE DAILY (Patient taking differently: Take 3.125 mg by mouth 2 (two) times daily with a meal. ) 180 tablet 0  . diclofenac sodium (VOLTAREN) 1 % GEL Apply 2 g topically 4 (four) times daily. 100 g 0  . dicyclomine  (BENTYL) 20 MG tablet TAKE 1 TABLET BY MOUTH THREE TIMES DAILY AS NEEDED FOR SPASM (Patient taking differently: Take 20 mg by mouth 3 (three) times daily as needed for spasms. TAKE 1 TABLET BY MOUTH THREE TIMES DAILY AS NEEDED FOR SPASM) 30 tablet 0  . docusate sodium (COLACE) 100 MG capsule Take 1 capsule (100 mg total) by mouth every 12 (twelve) hours. 60 capsule 0  . DULoxetine (CYMBALTA) 30 MG capsule Take 1 capsule (30 mg total) by mouth daily. 30 capsule 0  . ezetimibe (ZETIA) 10 MG tablet Take 1 tablet (10 mg total) by mouth daily. 90 tablet 3  . Ferrous Sulfate (IRON) 325 (65 Fe) MG TABS Take 1 tablet (325 mg total) by mouth 2 (two) times daily with a meal. 90 tablet 0  . fexofenadine (ALLEGRA ALLERGY) 60 MG tablet Take 1 tablet (60 mg total) by mouth 2 (two) times daily. 60 tablet 0  . gabapentin (NEURONTIN) 100 MG capsule Take 1 capsule (100 mg total) by mouth at bedtime. If tolerating, increase to 1 tablet three times a day 90 capsule 0  . glimepiride (AMARYL) 2 MG tablet Take 1 tablet by mouth once daily with breakfast (Patient taking differently: Take 2 mg by mouth daily with breakfast. TAKE 1 TABLET BY MOUTH ONCE DAILY WITH BREAKFAST) 90 tablet 0  . glucose blood (ONETOUCH VERIO) test strip Use as instructed to test glucose level three times daily. ICD-10 code: E11.9 100 each 12  . Lancet Devices (ONE TOUCH DELICA LANCING DEV) MISC 1 Device by Does not apply route as directed. ICD-10 code: E11.9 1 each 0  . meloxicam (MOBIC) 15 MG tablet Take 1 tablet (15 mg total) by mouth daily. 30 tablet 0  . ONETOUCH DELICA LANCETS 52D MISC 1 each by Does not apply route 3 (three) times daily. ICD-10 code: E11.9 100 each 12  . pantoprazole (PROTONIX) 40 MG tablet Take 1 tablet by mouth daily (Patient taking differently: Take 40 mg by mouth daily. ) 90 tablet 0  . polyethylene glycol (MIRALAX / GLYCOLAX) packet Take 17 g by mouth daily. 14 each 0  . rosuvastatin (CRESTOR) 40 MG tablet Take 1 tablet (40  mg total) by mouth daily. 90 tablet 3  . traMADol (ULTRAM) 50 MG tablet Take 1 tablet (50 mg total) by mouth every 12 (twelve) hours as needed for moderate pain. 30 tablet 0   No current facility-administered medications for this visit.     Allergies:   Codeine and Penicillins   Social History:  The patient  reports that she quit smoking about 2 years ago. Her smoking use included cigarettes. She started smoking about 42 years ago. She has a 9.75 pack-year smoking history. She has never used smokeless tobacco. She reports current alcohol use of about 21.0 standard drinks of alcohol  per week. She reports current drug use. Drug: Marijuana.   Family History:  The patient's  family history includes Anuerysm in her father; Colon cancer in her brother; Heart disease in her mother; Lung cancer in her brother; Prostate cancer in her brother.   ROS:  Please see the history of present illness.   All other systems are personally reviewed and negative.    Exam:    Vital Signs:  BP (!) 148/85   Pulse 93   Well appearing, alert and slurred speech, regular work of breathing,  good skin color Eyes- anicteric, neuro- grossly intact, skin- no apparent rash or lesions or cyanosis, mouth- oral mucosa is pink   Labs/Other Tests and Data Reviewed:    Recent Labs: 09/02/2018: ALT 29; BUN 14; Creatinine, Ser 0.90; Hemoglobin 16.0; Platelets 292; Potassium 3.1; Sodium 140   Wt Readings from Last 3 Encounters:  09/03/18 153 lb 2 oz (69.5 kg)  06/16/18 158 lb (71.7 kg)  02/13/18 150 lb (68 kg)     Other studies personally reviewed: Additional studies/ records that were reviewed today include: ECG 09/03/2018 personally reviewed Review of the above records today demonstrates: Sinus rhythm   ASSESSMENT & PLAN:    1.  Hypertension: Blood pressure is elevated today.  She did recently have a stroke and thus I Ifeoluwa Beller not make any attempts to bring it down further.  She has follow-up with neurology.  2.  CVA:  Multiple frontal strokes on MRI.  She was not admitted to the hospital and has follow-up with neurology.  Once neurology is on her for work-up, if she does require Linq monitoring, would certainly have a discussion with her about this.  Otherwise we Emmerie Battaglia leave further work-up to neurology.  Prior to her neurology appointment, we Benjamin Merrihew try to schedule a transthoracic echo and carotid Dopplers to see if she has disease there.  She Serina Nichter likely need follow-up in EP clinic for discussion of Linq monitoring.  Of note, the patient was worried about coronavirus and thus she refused hospital admission.   COVID 19 screen The patient denies symptoms of COVID 19 at this time.  The importance of social distancing was discussed today.  Follow-up:  3 months  Current medicines are reviewed at length with the patient today.   The patient does not have concerns regarding her medicines.  The following changes were made today:  none  Labs/ tests ordered today include: TTE, carotid dopplers   Patient Risk:  after full review of this patients clinical status, I feel that they are at moderate risk at this time.  Today, I have spent 11 minutes with the patient with telehealth technology discussing CVA .    Signed, Milanna Kozlov Meredith Leeds, MD  09/12/2018 10:46 AM     [facility] [address] [place] [address] [phone] (office) [phone] (fax)

## 2018-09-15 ENCOUNTER — Other Ambulatory Visit: Payer: Self-pay | Admitting: Family Medicine

## 2018-09-15 DIAGNOSIS — Z8673 Personal history of transient ischemic attack (TIA), and cerebral infarction without residual deficits: Secondary | ICD-10-CM

## 2018-09-15 NOTE — Telephone Encounter (Signed)
Home health order placed this morning for SLP. I will attempt to cancel th order that was placed on Friday.  Thanks  Marjie Skiff, MD [place], PGY-3

## 2018-09-15 NOTE — Addendum Note (Signed)
Addended by: Stanton Kidney on: 09/15/2018 09:42 AM   Modules accepted: Orders

## 2018-09-17 ENCOUNTER — Ambulatory Visit (HOSPITAL_COMMUNITY)
Admission: RE | Admit: 2018-09-17 | Discharge: 2018-09-17 | Disposition: A | Discharge status: Home / Self Care | Payer: Medicare Other | Source: Ambulatory Visit | Attending: Cardiology | Admitting: Cardiology

## 2018-09-17 ENCOUNTER — Other Ambulatory Visit (HOSPITAL_COMMUNITY): Payer: Self-pay | Admitting: Cardiology

## 2018-09-17 ENCOUNTER — Other Ambulatory Visit: Payer: Self-pay

## 2018-09-17 DIAGNOSIS — I6523 Occlusion and stenosis of bilateral carotid arteries: Secondary | ICD-10-CM

## 2018-09-17 DIAGNOSIS — I639 Cerebral infarction, unspecified: Secondary | ICD-10-CM | POA: Insufficient documentation

## 2018-09-19 ENCOUNTER — Ambulatory Visit (HOSPITAL_COMMUNITY)
Admission: RE | Admit: 2018-09-19 | Discharge: 2018-09-19 | Disposition: A | Discharge status: Home / Self Care | Payer: Medicare Other | Source: Ambulatory Visit | Attending: Cardiology | Admitting: Cardiology

## 2018-09-19 ENCOUNTER — Telehealth: Payer: Self-pay | Admitting: Family Medicine

## 2018-09-19 ENCOUNTER — Other Ambulatory Visit: Payer: Self-pay

## 2018-09-19 DIAGNOSIS — I639 Cerebral infarction, unspecified: Secondary | ICD-10-CM | POA: Diagnosis not present

## 2018-09-19 DIAGNOSIS — I351 Nonrheumatic aortic (valve) insufficiency: Secondary | ICD-10-CM | POA: Diagnosis not present

## 2018-09-19 DIAGNOSIS — I313 Pericardial effusion (noninflammatory): Secondary | ICD-10-CM | POA: Diagnosis not present

## 2018-09-19 NOTE — Telephone Encounter (Signed)
  Daughter is calling on Mother's behalf Mother reporting leg cramping from hip to thigh starting 5 pm  **After Hours/ Emergency Line Call*  Received a call to report that SHAGUANA LOVE is having leg cramping. Her daughter is calling on her mother's behalf. Reports her mother called the daughter to tell her that her leg was cramping up from the thigh to the hip and feels stiff. Daughter is unsure which leg. Daughter is wondering if she should come to ED or not.  Endorsing pain in thigh.  Denying focal weakness, change in sensation. The patient recently had stroke on 5/18 but refused admission to hospital due to [place] concerns. Recommended that daughter tell patient to use tylenol, massage, heating pad for relief but if things get worse or she has new neuro changes I would advise ED visit. Offered clinic appt Monday, daughter declined.  Red flags discussed.  Will forward to PCP.  Steve Rattler, DO PGY-3, [facility]

## 2018-09-22 ENCOUNTER — Telehealth: Payer: Self-pay | Admitting: *Deleted

## 2018-09-22 NOTE — Telephone Encounter (Signed)
[facility] called to report that the start of care will not be until Wednesday 6/10 per request of the patient and family.

## 2018-09-22 NOTE — Progress Notes (Addendum)
NEUROLOGY CONSULTATION NOTE  Margaret Rangel MRN: [id number] DOB: 10/07/1945  Referring provider: Deno Etienne, MD (ED referral) Primary care provider: Marjie Skiff, MD  Reason for consult:  Cerebrovascular accident  HISTORY OF PRESENT ILLNESS: Margaret Rangel is a 73 year old right-handed black woman with asthma, hypertension, hyperlipidemia, IBS, type 2 diabetes mellitus, chronic back pain and history of strokes who presents for cerebrovascular accident.  She is accompanied by her daughter who supplements history.  History supplemented by ED note.  Last month, she developed a left-sided headache with associated slurred speech.  There was no associated visual disturbance, unsteady gait, dizziness, or unilateral numbness or weakness.  She didn't seek immediate medical attention.  Over the next few days, symptoms gradually improved.  She had a virtual visit with her PCP 5 days later, on 09/02/18, who told her to go to the ED.  CT of head personally reviewed showed hypodensity in the left frontal lobe suspicious for acute infarct as well as age-indeterminate strokes in the bilateral basal ganglia.  MRI of brain without contrast showed multiple small acute strokes in the left frontal region as well as chronic small vessel ischemic changes and remote lacunar infarcts involving both basal ganglia, thalami and pons.  Hospital admission was recommended but patient and her family declined.  Labs from 09/03/18 showed lipid panel with total cholesterol 276, TG 378, HDL 41 and LDL 159.  Hgb A1c was 7.4%.  She later had an outpatient carotid doppler on 09/17/18 which demonstrated 40-59% stenosis in the right internal carotid artery and 1-39% stenosis in the left internal carotid artery as well as over 50% stenosis in the right ECA, left CCA and left ECA.  2D echocardiogram from 09/19/18 demonstrated LVEF 60-65% with no atrial level shunt detected by color flow Doppler.  Cardiology is now discussing Linq monitoring.  She had not been on antiplatelet therapy due to GI bleed in 2014.  Since this recent stroke, she has started ASA 32m daily.  She was restarted on Crestor (previously stopped it). She has prior history of stroke with stroke risk factors including hypertension, hyperlipidemia, type 2 diabetes mellitus, former cigarette smoker.  Speech has improved.  She is to start speech therapy on Wednesday.    PAST MEDICAL HISTORY: Past Medical History:  Diagnosis Date  . Abdominal pain   . Anal fissure   . Arthritis   . Asthma   . Chronic back pain   . Colon adenoma 2009, 2012   Colonoscopy  . Diverticulosis of colon (without mention of hemorrhage) 2009   Colonoscopy  . [place]    off medicines for diabetes 12-18-13  . GERD (gastroesophageal reflux disease)   . Hemorrhoids   . Hernia   . HTN (hypertension)   . Hyperlipidemia    low  . Irritable bowel syndrome (IBS)   . Low blood potassium   . Obese   . Stroke ([place]    x2  . Ulcer     PAST SURGICAL HISTORY: Past Surgical History:  Procedure Laterality Date  . COLONOSCOPY    . FLEXIBLE SIGMOIDOSCOPY N/A 07/14/2012   Procedure: FLEXIBLE SIGMOIDOSCOPY;  Surgeon: RInda Castle MD;  Location: [facility] END[facility]COPY;  Service: Endoscopy;  Laterality: N/A;  . HEMORRHOID BANDING N/A 07/14/2012   Procedure: HEMORRHOID BANDING;  Surgeon: RInda Castle MD;  Location: [facility] ENDOSCOPY;  Service: Endoscopy;  Laterality: N/A;  . PARTIAL HYSTERECTOMY  1971  . POLYPECTOMY    . TONSILLECTOMY  MEDICATIONS: Current Outpatient Medications on File Prior to Visit  Medication Sig Dispense Refill  . albuterol (PROVENTIL HFA;VENTOLIN HFA) 108 (90 Base) MCG/ACT inhaler Inhale 2 puffs into the lungs every 6 (six) hours as needed for wheezing or shortness of breath. 1 Inhaler 2  . aspirin (EQ ASPIRIN ADULT LOW DOSE) 81 MG EC tablet Take 1 tablet (81 mg total) by mouth daily. 90 tablet 3  . benzonatate (TESSALON PERLES) 100 MG capsule Take 1  capsule (100 mg total) by mouth 3 (three) times daily as needed for cough. 20 capsule 0  . Blood Glucose Calibration (ONETOUCH VERIO) High SOLN 1 each by In Vitro route as directed. 1 each 6  . Blood Glucose Calibration (ONETOUCH VERIO) SOLN 1 Bottle by In Vitro route as directed. 1 each 6  . Blood Glucose Monitoring Suppl (ONETOUCH VERIO) w/Device KIT 1 kit by Does not apply route 3 (three) times daily. ICD-10 code: E11.9 1 kit 0  . busPIRone (BUSPAR) 5 MG tablet Take 1 tablet (5 mg total) by mouth 2 (two) times daily. 60 tablet 0  . Calcium Carb-Cholecalciferol 600-800 MG-UNIT TABS Take one tablet daily 60 tablet 1  . carvedilol (COREG) 3.125 MG tablet TAKE 1 TABLET BY MOUTH TWICE DAILY (Patient taking differently: Take 3.125 mg by mouth 2 (two) times daily with a meal. ) 180 tablet 0  . diclofenac sodium (VOLTAREN) 1 % GEL Apply 2 g topically 4 (four) times daily. 100 g 0  . dicyclomine (BENTYL) 20 MG tablet TAKE 1 TABLET BY MOUTH THREE TIMES DAILY AS NEEDED FOR SPASM (Patient taking differently: Take 20 mg by mouth 3 (three) times daily as needed for spasms. TAKE 1 TABLET BY MOUTH THREE TIMES DAILY AS NEEDED FOR SPASM) 30 tablet 0  . docusate sodium (COLACE) 100 MG capsule Take 1 capsule (100 mg total) by mouth every 12 (twelve) hours. 60 capsule 0  . DULoxetine (CYMBALTA) 30 MG capsule Take 1 capsule (30 mg total) by mouth daily. 30 capsule 0  . ezetimibe (ZETIA) 10 MG tablet Take 1 tablet (10 mg total) by mouth daily. 90 tablet 3  . Ferrous Sulfate (IRON) 325 (65 Fe) MG TABS Take 1 tablet (325 mg total) by mouth 2 (two) times daily with a meal. 90 tablet 0  . fexofenadine (ALLEGRA ALLERGY) 60 MG tablet Take 1 tablet (60 mg total) by mouth 2 (two) times daily. 60 tablet 0  . gabapentin (NEURONTIN) 100 MG capsule Take 1 capsule (100 mg total) by mouth at bedtime. If tolerating, increase to 1 tablet three times a day 90 capsule 0  . glimepiride (AMARYL) 2 MG tablet Take 1 tablet by mouth once daily  with breakfast (Patient taking differently: Take 2 mg by mouth daily with breakfast. TAKE 1 TABLET BY MOUTH ONCE DAILY WITH BREAKFAST) 90 tablet 0  . glucose blood (ONETOUCH VERIO) test strip Use as instructed to test glucose level three times daily. ICD-10 code: E11.9 100 each 12  . Lancet Devices (ONE TOUCH DELICA LANCING DEV) MISC 1 Device by Does not apply route as directed. ICD-10 code: E11.9 1 each 0  . meloxicam (MOBIC) 15 MG tablet Take 1 tablet (15 mg total) by mouth daily. 30 tablet 0  . ONETOUCH DELICA LANCETS 68S MISC 1 each by Does not apply route 3 (three) times daily. ICD-10 code: E11.9 100 each 12  . pantoprazole (PROTONIX) 40 MG tablet Take 1 tablet by mouth daily (Patient taking differently: Take 40 mg by mouth daily. ) 90  tablet 0  . polyethylene glycol (MIRALAX / GLYCOLAX) packet Take 17 g by mouth daily. 14 each 0  . rosuvastatin (CRESTOR) 40 MG tablet Take 1 tablet (40 mg total) by mouth daily. 90 tablet 3  . traMADol (ULTRAM) 50 MG tablet Take 1 tablet (50 mg total) by mouth every 12 (twelve) hours as needed for moderate pain. 30 tablet 0   No current facility-administered medications on file prior to visit.     ALLERGIES: Allergies  Allergen Reactions  . Codeine Itching  . Penicillins Itching    Has patient had a PCN reaction causing immediate rash, facial/tongue/throat swelling, SOB or lightheadedness with hypotension:No Has patient had a PCN reaction causing severe rash involving mucus membranes or skin necrosis:No Has patient had a PCN reaction that required hopititalization:No Has patient had a PCN reaction occurring within the last 10 years:No If all of the above answers are "NO", then may proceed with Cephalosporin use.     FAMILY HISTORY: Family History  Problem Relation Age of Onset  . Prostate cancer Brother   . Heart disease Mother   . Anuerysm Father   . Lung cancer Brother   . Colon cancer Brother   . Rectal cancer Neg Hx   . Stomach cancer Neg  Hx   . Esophageal cancer Neg Hx    SOCIAL HISTORY: Social History   Socioeconomic History  . Marital status: Single    Spouse name: Not on file  . Number of children: 3  . Years of education: Not on file  . Highest education level: Not on file  Occupational History  . Occupation: UNEMPLOYED    Employer: UNEMPLOYED  Social Needs  . Financial resource strain: Not on file  . Food insecurity:    Worry: Not on file    Inability: Not on file  . Transportation needs:    Medical: Not on file    Non-medical: Not on file  Tobacco Use  . Smoking status: Former Smoker    Packs/day: 0.25    Years: 39.00    Pack years: 9.75    Types: Cigarettes    Start date: 04/16/1976    Last attempt to quit: 02/13/2016    Years since quitting: 2.6  . Smokeless tobacco: Never Used  . Tobacco comment: 4-5 cigs per day for 39 years  Substance and Sexual Activity  . Alcohol use: Yes    Alcohol/week: 21.0 standard drinks    Types: 21 Cans of beer per week    Comment: quit 2 years ago  . Drug use: Yes    Types: Marijuana    Comment: using for depression// last used this morning 4am  . Sexual activity: Not on file  Lifestyle  . Physical activity:    Days per week: Not on file    Minutes per session: Not on file  . Stress: Not on file  Relationships  . Social connections:    Talks on phone: Not on file    Gets together: Not on file    Attends religious service: Not on file    Active member of club or organization: Not on file    Attends meetings of clubs or organizations: Not on file    Relationship status: Not on file  . Intimate partner violence:    Fear of current or ex partner: Not on file    Emotionally abused: Not on file    Physically abused: Not on file    Forced sexual activity: Not on file  Other Topics Concern  . Not on file  Social History Narrative  . Not on file    REVIEW OF SYSTEMS: Constitutional: No fevers, chills, or sweats, no generalized fatigue, change in appetite  Eyes: No visual changes, double vision, eye pain Ear, nose and throat: No hearing loss, ear pain, nasal congestion, sore throat Cardiovascular: No chest pain, palpitations Respiratory:  No shortness of breath at rest or with exertion, wheezes GastrointestinaI: No nausea, vomiting, diarrhea, abdominal pain, fecal incontinence Genitourinary:  No dysuria, urinary retention or frequency Musculoskeletal:  No neck pain, back pain Integumentary: No rash, pruritus, skin lesions Neurological: as above Psychiatric: No depression, insomnia, anxiety Endocrine: No palpitations, fatigue, diaphoresis, mood swings, change in appetite, change in weight, increased thirst Hematologic/Lymphatic:  No purpura, petechiae. Allergic/Immunologic: no itchy/runny eyes, nasal congestion, recent allergic reactions, rashes  PHYSICAL EXAM: Blood pressure (!) 142/60, pulse 90, height '5\' 2"'  (1.575 m), weight 146 lb (66.2 kg). General: No acute distress.  Patient appears well-groomed.  Head:  Normocephalic/atraumatic Eyes:  fundi examined but not visualized Neck: supple, no paraspinal tenderness, full range of motion Back: No paraspinal tenderness Heart: regular rate and rhythm Lungs: Clear to auscultation bilaterally. Vascular: No carotid bruits. Neurological Exam: Mental status: alert and oriented to person, place, and time, recent and remote memory intact, fund of knowledge intact, attention and concentration intact, speech with mixed fluency, mildly dysarthric, mild difficulty naming and repeating.  Able to follow commands. Cranial nerves: CN I: not tested CN II: pupils equal, round and reactive to light, visual fields intact CN III, IV, VI:  full range of motion, no nystagmus, no ptosis CN V: facial sensation intact CN VII: upper and lower face symmetric CN VIII: hearing intact CN IX, X: gag intact, uvula midline CN XI: sternocleidomastoid and trapezius muscles intact CN XII: tongue midline Bulk & Tone:  normal, no fasciculations. Motor:  5-/5 throughout Sensation:  Temperature reduced in feet; vibration sensation intact. Deep Tendon Reflexes:  1+ throughout except absent in ankles; toes downgoing.  Finger to nose testing:  Without dysmetria.  Heel to shin:  Without dysmetria.   Gait:  Cautious, mildly wide-based.  Romberg negative.  IMPRESSION: 1.  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 2.  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  Type 2 diabetes mellitus 4.  Hypertension 5.  Hyperlipidemia  PLAN: 1.  Continue ASA 84m daily for secondary stroke prevention 2.  Continue Crestor.  Repeat lipid panel a week prior to follow up.  LDL goal less than 70. 3.  Follow up blood pressure with PCP. 4.  Optimize glycemic control 5.  Agree with cardiology for long-term cardiac monitoring to evaluate for atrial fibrillation.  Will ask if TEE would also be of benefit.  ADDENDUM:  After discussion with cardiology, would defer TEE as uncertain utility especially given that we are far from the event. 6.  Speech therapy 7.  Mediterranean diet 8.  Follow up in 4 months.  Thank you for allowing me to take part in the care of this patient.  AMetta Clines DO  CC:  AMarjie Skiff MD  Will MMeredith Leeds MD

## 2018-09-23 ENCOUNTER — Encounter: Payer: Self-pay | Admitting: Neurology

## 2018-09-23 ENCOUNTER — Ambulatory Visit (INDEPENDENT_AMBULATORY_CARE_PROVIDER_SITE_OTHER): Payer: Medicare Other | Admitting: Neurology

## 2018-09-23 ENCOUNTER — Other Ambulatory Visit: Payer: Self-pay

## 2018-09-23 VITALS — BP 142/60 | HR 90 | Ht 62.0 in | Wt 146.0 lb

## 2018-09-23 DIAGNOSIS — I10 Essential (primary) hypertension: Secondary | ICD-10-CM | POA: Diagnosis not present

## 2018-09-23 DIAGNOSIS — I69320 Aphasia following cerebral infarction: Secondary | ICD-10-CM | POA: Diagnosis not present

## 2018-09-23 DIAGNOSIS — I63412 Cerebral infarction due to embolism of left middle cerebral artery: Secondary | ICD-10-CM

## 2018-09-23 DIAGNOSIS — E7800 Pure hypercholesterolemia, unspecified: Secondary | ICD-10-CM

## 2018-09-23 DIAGNOSIS — E1169 Type 2 diabetes mellitus with other specified complication: Secondary | ICD-10-CM | POA: Diagnosis not present

## 2018-09-23 DIAGNOSIS — E785 Hyperlipidemia, unspecified: Secondary | ICD-10-CM | POA: Diagnosis not present

## 2018-09-23 NOTE — Patient Instructions (Addendum)
1.  Continue aspirin 81mg  daily 2.  Continue cholesterol, blood pressure and diabetes medicine 3.  Agree that you need a heart monitor 4.  Start speech therapy 5.  Follow up in 4 months.  Repeat lipid panel about a week prior to follow up.  Your provider has requested that you have labwork. One week prior to your follow up visit on 01/26/19, you will need to come to our office and check in with our reception. We will then send you to [facility] ([id number]) on the second floor of this building. You do not need to check in. If you are not called within 15 minutes please check with the front desk.

## 2018-09-24 DIAGNOSIS — E119 Type 2 diabetes mellitus without complications: Secondary | ICD-10-CM | POA: Diagnosis not present

## 2018-09-24 DIAGNOSIS — I69322 Dysarthria following cerebral infarction: Secondary | ICD-10-CM | POA: Diagnosis not present

## 2018-09-24 DIAGNOSIS — M1611 Unilateral primary osteoarthritis, right hip: Secondary | ICD-10-CM | POA: Diagnosis not present

## 2018-09-24 DIAGNOSIS — J45909 Unspecified asthma, uncomplicated: Secondary | ICD-10-CM | POA: Diagnosis not present

## 2018-09-24 DIAGNOSIS — K219 Gastro-esophageal reflux disease without esophagitis: Secondary | ICD-10-CM | POA: Diagnosis not present

## 2018-09-24 DIAGNOSIS — Z87891 Personal history of nicotine dependence: Secondary | ICD-10-CM | POA: Diagnosis not present

## 2018-09-24 DIAGNOSIS — M170 Bilateral primary osteoarthritis of knee: Secondary | ICD-10-CM | POA: Diagnosis not present

## 2018-09-24 DIAGNOSIS — Z7984 Long term (current) use of oral hypoglycemic drugs: Secondary | ICD-10-CM | POA: Diagnosis not present

## 2018-09-24 DIAGNOSIS — I69354 Hemiplegia and hemiparesis following cerebral infarction affecting left non-dominant side: Secondary | ICD-10-CM | POA: Diagnosis not present

## 2018-09-24 DIAGNOSIS — E785 Hyperlipidemia, unspecified: Secondary | ICD-10-CM | POA: Diagnosis not present

## 2018-09-24 DIAGNOSIS — I69320 Aphasia following cerebral infarction: Secondary | ICD-10-CM | POA: Diagnosis not present

## 2018-09-24 DIAGNOSIS — I10 Essential (primary) hypertension: Secondary | ICD-10-CM | POA: Diagnosis not present

## 2018-09-24 NOTE — Telephone Encounter (Signed)
Dr. Carmelina Dane office placed the order

## 2018-09-25 ENCOUNTER — Telehealth: Payer: Self-pay

## 2018-09-25 NOTE — Telephone Encounter (Signed)
Margaret Rangel, [place], called nurse line requesting VO:  1x a week for 1 week  2x a week for 6 weeks  PT and OT evaluation   Ok to leave VM on [place] VM [phone]

## 2018-09-26 NOTE — Telephone Encounter (Signed)
Verbal orders given to SLP therapist.  Thank you  Marjie Skiff, MD [place], PGY-3

## 2018-09-30 DIAGNOSIS — I69320 Aphasia following cerebral infarction: Secondary | ICD-10-CM | POA: Diagnosis not present

## 2018-09-30 DIAGNOSIS — K219 Gastro-esophageal reflux disease without esophagitis: Secondary | ICD-10-CM | POA: Diagnosis not present

## 2018-09-30 DIAGNOSIS — I69354 Hemiplegia and hemiparesis following cerebral infarction affecting left non-dominant side: Secondary | ICD-10-CM | POA: Diagnosis not present

## 2018-09-30 DIAGNOSIS — E785 Hyperlipidemia, unspecified: Secondary | ICD-10-CM | POA: Diagnosis not present

## 2018-09-30 DIAGNOSIS — I10 Essential (primary) hypertension: Secondary | ICD-10-CM | POA: Diagnosis not present

## 2018-09-30 DIAGNOSIS — M170 Bilateral primary osteoarthritis of knee: Secondary | ICD-10-CM | POA: Diagnosis not present

## 2018-09-30 DIAGNOSIS — Z7984 Long term (current) use of oral hypoglycemic drugs: Secondary | ICD-10-CM | POA: Diagnosis not present

## 2018-09-30 DIAGNOSIS — M1611 Unilateral primary osteoarthritis, right hip: Secondary | ICD-10-CM | POA: Diagnosis not present

## 2018-09-30 DIAGNOSIS — J45909 Unspecified asthma, uncomplicated: Secondary | ICD-10-CM | POA: Diagnosis not present

## 2018-09-30 DIAGNOSIS — E119 Type 2 diabetes mellitus without complications: Secondary | ICD-10-CM | POA: Diagnosis not present

## 2018-09-30 DIAGNOSIS — Z87891 Personal history of nicotine dependence: Secondary | ICD-10-CM | POA: Diagnosis not present

## 2018-09-30 DIAGNOSIS — I69322 Dysarthria following cerebral infarction: Secondary | ICD-10-CM | POA: Diagnosis not present

## 2018-10-02 ENCOUNTER — Telehealth: Payer: Self-pay | Admitting: *Deleted

## 2018-10-02 NOTE — Telephone Encounter (Signed)
Pt does not want [facility] OT to come out till next week.  They need a verbal order from MD to go out next week.  Christen Bame, CMA

## 2018-10-02 NOTE — Telephone Encounter (Signed)
Left VM with verbal orders for Mrs. Hassell Done OT.  Thanks  Marjie Skiff, MD [place], PGY-3

## 2018-10-07 ENCOUNTER — Telehealth: Payer: Self-pay

## 2018-10-07 DIAGNOSIS — K219 Gastro-esophageal reflux disease without esophagitis: Secondary | ICD-10-CM | POA: Diagnosis not present

## 2018-10-07 DIAGNOSIS — I69320 Aphasia following cerebral infarction: Secondary | ICD-10-CM | POA: Diagnosis not present

## 2018-10-07 DIAGNOSIS — I69322 Dysarthria following cerebral infarction: Secondary | ICD-10-CM | POA: Diagnosis not present

## 2018-10-07 DIAGNOSIS — I69354 Hemiplegia and hemiparesis following cerebral infarction affecting left non-dominant side: Secondary | ICD-10-CM | POA: Diagnosis not present

## 2018-10-07 DIAGNOSIS — J45909 Unspecified asthma, uncomplicated: Secondary | ICD-10-CM | POA: Diagnosis not present

## 2018-10-07 DIAGNOSIS — M1611 Unilateral primary osteoarthritis, right hip: Secondary | ICD-10-CM | POA: Diagnosis not present

## 2018-10-07 DIAGNOSIS — Z7984 Long term (current) use of oral hypoglycemic drugs: Secondary | ICD-10-CM | POA: Diagnosis not present

## 2018-10-07 DIAGNOSIS — E119 Type 2 diabetes mellitus without complications: Secondary | ICD-10-CM | POA: Diagnosis not present

## 2018-10-07 DIAGNOSIS — M170 Bilateral primary osteoarthritis of knee: Secondary | ICD-10-CM | POA: Diagnosis not present

## 2018-10-07 DIAGNOSIS — I10 Essential (primary) hypertension: Secondary | ICD-10-CM | POA: Diagnosis not present

## 2018-10-07 DIAGNOSIS — Z87891 Personal history of nicotine dependence: Secondary | ICD-10-CM | POA: Diagnosis not present

## 2018-10-07 DIAGNOSIS — E785 Hyperlipidemia, unspecified: Secondary | ICD-10-CM | POA: Diagnosis not present

## 2018-10-07 NOTE — Telephone Encounter (Signed)
Margaret Rangel, home health PT, called nurse line requesting VO for PT evaluation.   VO can be called to [place], ok to leave a VM.

## 2018-10-07 NOTE — Telephone Encounter (Signed)
Verbal orders left on VM.  Thank you   Marjie Skiff, MD [place], PGY-3

## 2018-10-08 DIAGNOSIS — E119 Type 2 diabetes mellitus without complications: Secondary | ICD-10-CM | POA: Diagnosis not present

## 2018-10-08 DIAGNOSIS — Z7984 Long term (current) use of oral hypoglycemic drugs: Secondary | ICD-10-CM | POA: Diagnosis not present

## 2018-10-08 DIAGNOSIS — Z87891 Personal history of nicotine dependence: Secondary | ICD-10-CM | POA: Diagnosis not present

## 2018-10-08 DIAGNOSIS — J45909 Unspecified asthma, uncomplicated: Secondary | ICD-10-CM | POA: Diagnosis not present

## 2018-10-08 DIAGNOSIS — I10 Essential (primary) hypertension: Secondary | ICD-10-CM | POA: Diagnosis not present

## 2018-10-08 DIAGNOSIS — E785 Hyperlipidemia, unspecified: Secondary | ICD-10-CM | POA: Diagnosis not present

## 2018-10-08 DIAGNOSIS — I69320 Aphasia following cerebral infarction: Secondary | ICD-10-CM | POA: Diagnosis not present

## 2018-10-08 DIAGNOSIS — I69322 Dysarthria following cerebral infarction: Secondary | ICD-10-CM | POA: Diagnosis not present

## 2018-10-08 DIAGNOSIS — K219 Gastro-esophageal reflux disease without esophagitis: Secondary | ICD-10-CM | POA: Diagnosis not present

## 2018-10-08 DIAGNOSIS — M1611 Unilateral primary osteoarthritis, right hip: Secondary | ICD-10-CM | POA: Diagnosis not present

## 2018-10-08 DIAGNOSIS — M170 Bilateral primary osteoarthritis of knee: Secondary | ICD-10-CM | POA: Diagnosis not present

## 2018-10-08 DIAGNOSIS — I69354 Hemiplegia and hemiparesis following cerebral infarction affecting left non-dominant side: Secondary | ICD-10-CM | POA: Diagnosis not present

## 2018-10-09 ENCOUNTER — Telehealth: Payer: Self-pay

## 2018-10-09 ENCOUNTER — Other Ambulatory Visit: Payer: Self-pay

## 2018-10-09 DIAGNOSIS — M48061 Spinal stenosis, lumbar region without neurogenic claudication: Secondary | ICD-10-CM | POA: Diagnosis not present

## 2018-10-09 NOTE — Telephone Encounter (Signed)
Verbal orders given.  Thanks  Marjie Skiff, MD [place], PGY-3

## 2018-10-09 NOTE — Telephone Encounter (Signed)
[facility] called nurse line requesting VO for OT evaluation for next week.   [phone] to LVM

## 2018-10-15 ENCOUNTER — Other Ambulatory Visit: Payer: Self-pay | Admitting: *Deleted

## 2018-10-15 ENCOUNTER — Telehealth: Payer: Self-pay | Admitting: *Deleted

## 2018-10-15 DIAGNOSIS — I779 Disorder of arteries and arterioles, unspecified: Secondary | ICD-10-CM

## 2018-10-15 NOTE — Telephone Encounter (Signed)
Called dtr to follow up. Dtr informs me that she just found out, an hour ago, that she has been exposed to Covid and now quarantined. She is the only one who can assist with getting pt to apps and cannot d/t exposure. dtr aware I will follow up in several weeks to re-discuss scheduling ILR implant. Dtr verbalized understanding and agreeable to plan.

## 2018-10-15 NOTE — Telephone Encounter (Signed)
Notes recorded by Stanton Kidney, RN on 10/06/2018 at 5:18 PM EDT  Results reviewed previously w/ dtr.  Followed up to discuss ILR implant recommended by Dr. Curt Bears.  Dtr would like to discuss w/ mom before scheduling visit to discuss implant. We agreed to speak again this week after she has spoken w/ mom

## 2018-10-16 NOTE — Telephone Encounter (Signed)
M[facility] calls again.  She is still unable to reach patient.   She will need a verbal to go out next week and try again.   Christen Bame, CMA

## 2018-10-29 ENCOUNTER — Ambulatory Visit: Payer: Medicare Other | Admitting: Family Medicine

## 2018-10-30 DIAGNOSIS — M48061 Spinal stenosis, lumbar region without neurogenic claudication: Secondary | ICD-10-CM | POA: Diagnosis not present

## 2018-10-30 DIAGNOSIS — M5440 Lumbago with sciatica, unspecified side: Secondary | ICD-10-CM | POA: Diagnosis not present

## 2018-10-30 DIAGNOSIS — M25511 Pain in right shoulder: Secondary | ICD-10-CM | POA: Diagnosis not present

## 2018-11-03 ENCOUNTER — Other Ambulatory Visit: Payer: Self-pay | Admitting: *Deleted

## 2018-11-04 ENCOUNTER — Telehealth: Payer: Self-pay | Admitting: Neurology

## 2018-11-04 MED ORDER — DOCUSATE SODIUM 100 MG PO CAPS: 100.0000 mg | ORAL_CAPSULE | Freq: Two times a day (BID) | ORAL | 0 refills | Status: AC

## 2018-11-04 MED ORDER — ROSUVASTATIN CALCIUM 40 MG PO TABS: 40.0000 mg | ORAL_TABLET | Freq: Every day | ORAL | 3 refills | Status: DC

## 2018-11-04 MED ORDER — PANTOPRAZOLE SODIUM 40 MG PO TBEC: 40.0000 mg | DELAYED_RELEASE_TABLET | Freq: Every day | ORAL | 3 refills | Status: DC

## 2018-11-04 MED ORDER — GLIMEPIRIDE 2 MG PO TABS: 2.0000 mg | ORAL_TABLET | Freq: Every day | ORAL | 2 refills | Status: DC

## 2018-11-04 MED ORDER — POLYETHYLENE GLYCOL 3350 17 G PO PACK: 17.0000 g | PACK | Freq: Every day | ORAL | 0 refills | Status: AC

## 2018-11-04 MED ORDER — CARVEDILOL 3.125 MG PO TABS: 3.1250 mg | ORAL_TABLET | Freq: Two times a day (BID) | ORAL | 0 refills | Status: DC

## 2018-11-04 MED ORDER — IRON 325 (65 FE) MG PO TABS: 325.0000 mg | ORAL_TABLET | Freq: Two times a day (BID) | ORAL | 0 refills | Status: DC

## 2018-11-04 MED ORDER — EZETIMIBE 10 MG PO TABS: 10.0000 mg | ORAL_TABLET | Freq: Every day | ORAL | 3 refills | Status: DC

## 2018-11-04 NOTE — Telephone Encounter (Signed)
New Message  Patients daughter verbalized she is wanting all of the patients medications refilled.  Advised patients daughter she needs to contact the correct office to refill the right rx.  Patients daughter verbalized she does not know what office to call for what medications.  Advised patients daughter to look on the rx label and it will display the prescriber or md who prescribed rx.  Patients daughter said to just fill all the patients medications.  Patients daughter also verbalized she called the heart MD and they told her to call us but they were going to fill medications also.  Please f/u patient if needed

## 2018-11-04 NOTE — Telephone Encounter (Signed)
We are not filling medications for Pt. 09/23/18 consultation note states for her to continue ASA 81 mg and Crestor. Crestor was filled by her PCP today. No action required

## 2018-11-07 ENCOUNTER — Other Ambulatory Visit: Payer: Self-pay

## 2018-11-07 ENCOUNTER — Telehealth (INDEPENDENT_AMBULATORY_CARE_PROVIDER_SITE_OTHER): Payer: Medicare Other | Admitting: Family Medicine

## 2018-11-07 DIAGNOSIS — M170 Bilateral primary osteoarthritis of knee: Secondary | ICD-10-CM

## 2018-11-07 MED ORDER — TRAMADOL HCL 50 MG PO TABS: 50.0000 mg | ORAL_TABLET | Freq: Two times a day (BID) | ORAL | 0 refills | Status: DC | PRN

## 2018-11-07 NOTE — Progress Notes (Signed)
[place] Telemedicine Visit  Patient consented to have virtual visit. Method of visit: Telephone  Encounter participants: Patient: Margaret Rangel - located at home Provider: Daisy Floro - located at clinic Others (if applicable): daughter/care giver  Chief Complaint: Medication Reconciliation  HPI: Patient is a pleasant 73 year old female with history of type 2 diabetes, hypertension, hyperlipidemia, and stroke.  Her daughter/caregiver wanted to do a detailed medication reconciliation for the patient's benefit.  The patient denies having fevers, headaches, shortness of breath, chest pain, nausea, vomiting, diarrhea, constipation, abdominal pain, and changes in neurological function.  She has no other concerns at this time.  ROS: per HPI  Pertinent PMHx: Type 2 diabetes, hypertension, hyperlipidemia, stroke, acid reflux, asthma  Exam:  Respiratory: Normal work of breathing, speaking in complete sentences  Assessment/Plan:  No problem-specific Assessment & Plan notes found for this encounter.    Time spent during visit with patient: 07:00 minutes  Milus Banister, [place], PGY-2 11/07/2018 5:12 PM

## 2018-11-12 NOTE — Progress Notes (Signed)
ADDENDUM: Assessment: Medication Management for patient with caregiver  Plan: caregiver was contacted and medication list was discussed thoroughly. Each medicine was listed with it's dose with the caregiver and repeated when and how often each medicine should be taken. The caregiver documented the encounter and voiced understanding of the patient's medication plan.  Milus Banister, [place], PGY-2 11/12/2018 7:09 PM

## 2018-11-20 NOTE — Telephone Encounter (Signed)
Attempted to reach dtr to discuss ILR implant. No answer, unable to leave a message

## 2018-12-09 DIAGNOSIS — M25511 Pain in right shoulder: Secondary | ICD-10-CM | POA: Diagnosis not present

## 2018-12-09 DIAGNOSIS — M5440 Lumbago with sciatica, unspecified side: Secondary | ICD-10-CM | POA: Diagnosis not present

## 2018-12-09 DIAGNOSIS — M25512 Pain in left shoulder: Secondary | ICD-10-CM | POA: Diagnosis not present

## 2018-12-11 NOTE — Telephone Encounter (Signed)
Finally reached pt's dtr. Dtr tells me pt is very hesitant about having ILR implant and asked for OV to re-discuss w/ Dr. Curt Bears.   Scheduled for 9/9.  Dtr will come up with patient. Dtr verbalized understanding and agreeable to plan.

## 2018-12-18 ENCOUNTER — Other Ambulatory Visit: Payer: Self-pay | Admitting: Family Medicine

## 2018-12-18 DIAGNOSIS — M170 Bilateral primary osteoarthritis of knee: Secondary | ICD-10-CM

## 2018-12-24 ENCOUNTER — Ambulatory Visit: Payer: Medicare Other | Admitting: Cardiology

## 2018-12-24 ENCOUNTER — Telehealth: Payer: Self-pay | Admitting: *Deleted

## 2018-12-24 ENCOUNTER — Other Ambulatory Visit: Payer: Self-pay

## 2018-12-24 NOTE — Telephone Encounter (Signed)
Pt was scheduled for OV today w/ Dr. Curt Bears to further discuss ILR implant.  Pt was hesitant about this, per dtr, so we thought face-to-face visit w/ MD would help alleviate anxiety r/t ILR implant. Pt came into office today very agitated and loud.  Dtr reports pt does this at any MD office since her stroke.  Explained to dtr that we cannot consider ILR implant d/t pt agitation/condition.  Explained that she will have to remain still for implant and dtr verbalizes that she does not thing mom will be still for that or for that long. Cancelled appt for today and did not charge pt. Will forward to neuro for their FYI.

## 2018-12-24 NOTE — Progress Notes (Deleted)
Electrophysiology Office Note   Date:  12/24/2018   ID:  Margaret Rangel, Margaret Rangel 04-04-1946, MRN [id number]  PCP:  Daisy Floro, DO  Primary Electrophysiologist:  Constance Haw, MD    No chief complaint on file.    History of Present Illness: Margaret Rangel is a 73 y.o. female who presents today for electrophysiology evaluation.   She has a history of diabetes, hypertension, hyperlipidemia, and stroke 2. On 03/07/16, she presented to the emergency room with chest pain associated with shortness of breath, nausea, diaphoresis, and vomiting. She says that the pain occurred when she was lying in bed. The pain radiated up to her left arm. Her EKG the EMS showed she had diffuse ST depressions which were more pronounced than previous EKGs. Her pain resolved after full dose aspirin and nitroglycerin. The patient was asymptomatic in the emergency room. She quit smoking 3 months prior to her emergency room visit. The patient signed out of the emergency room at that time AMA.  She presented to the hospital 09/02/2018 with slurred speech and headaches.  She was found to have multiple small strokes in her left frontal region.  Unfortunately she presented 4 to 5 days late.  She did not wish to be admitted to the hospital and was discharged with clinic follow-up.  Today, denies symptoms of palpitations, chest pain, shortness of breath, orthopnea, PND, lower extremity edema, claudication, dizziness, presyncope, syncope, bleeding, or neurologic sequela. The patient is tolerating medications without difficulties. ***    Past Medical History:  Diagnosis Date  . Abdominal pain   . Anal fissure   . Arthritis   . Asthma   . Chronic back pain   . Colon adenoma 2009, 2012   Colonoscopy  . Diverticulosis of colon (without mention of hemorrhage) 2009   Colonoscopy  . DM (diabetes mellitus) ([place])    off medicines for diabetes 12-18-13  . GERD (gastroesophageal reflux disease)   . Hemorrhoids   .  Hernia   . HTN (hypertension)   . Hyperlipidemia    low  . Irritable bowel syndrome (IBS)   . Low blood potassium   . Obese   . Stroke ([place])    x2  . Ulcer    Past Surgical History:  Procedure Laterality Date  . COLONOSCOPY    . FLEXIBLE SIGMOIDOSCOPY N/A 07/14/2012   Procedure: FLEXIBLE SIGMOIDOSCOPY;  Surgeon: Inda Castle, MD;  Location: [facility] ENDOSCOPY;  Service: Endoscopy;  Laterality: N/A;  . HEMORRHOID BANDING N/A 07/14/2012   Procedure: HEMORRHOID BANDING;  Surgeon: Inda Castle, MD;  Location: [facility] ENDOSCOPY;  Service: Endoscopy;  Laterality: N/A;  . PARTIAL HYSTERECTOMY  1971  . POLYPECTOMY    . TONSILLECTOMY       Current Outpatient Medications  Medication Sig Dispense Refill  . albuterol (PROVENTIL HFA;VENTOLIN HFA) 108 (90 Base) MCG/ACT inhaler Inhale 2 puffs into the lungs every 6 (six) hours as needed for wheezing or shortness of breath. 1 Inhaler 2  . aspirin (EQ ASPIRIN ADULT LOW DOSE) 81 MG EC tablet Take 1 tablet (81 mg total) by mouth daily. 90 tablet 3  . Blood Glucose Calibration (ONETOUCH VERIO) High SOLN 1 each by In Vitro route as directed. 1 each 6  . Blood Glucose Calibration (ONETOUCH VERIO) SOLN 1 Bottle by In Vitro route as directed. 1 each 6  . Blood Glucose Monitoring Suppl (ONETOUCH VERIO) w/Device KIT 1 kit by Does not apply route 3 (three) times daily. ICD-10 code: E11.9 1  kit 0  . carvedilol (COREG) 3.125 MG tablet Take 1 tablet (3.125 mg total) by mouth 2 (two) times daily. 180 tablet 0  . docusate sodium (COLACE) 100 MG capsule Take 1 capsule (100 mg total) by mouth every 12 (twelve) hours. 60 capsule 0  . ezetimibe (ZETIA) 10 MG tablet Take 1 tablet (10 mg total) by mouth daily. 90 tablet 3  . Ferrous Sulfate (IRON) 325 (65 Fe) MG TABS Take 1 tablet (325 mg total) by mouth 2 (two) times daily with a meal. 90 tablet 0  . glimepiride (AMARYL) 2 MG tablet Take 1 tablet (2 mg total) by mouth daily with breakfast. TAKE 1 TABLET BY MOUTH ONCE DAILY  WITH BREAKFAST 30 tablet 2  . glucose blood (ONETOUCH VERIO) test strip Use as instructed to test glucose level three times daily. ICD-10 code: E11.9 100 each 12  . Lancet Devices (ONE TOUCH DELICA LANCING DEV) MISC 1 Device by Does not apply route as directed. ICD-10 code: E11.9 1 each 0  . pantoprazole (PROTONIX) 40 MG tablet Take 1 tablet (40 mg total) by mouth daily. 90 tablet 3  . polyethylene glycol (MIRALAX / GLYCOLAX) 17 g packet Take 17 g by mouth daily. 14 each 0  . rosuvastatin (CRESTOR) 40 MG tablet Take 1 tablet (40 mg total) by mouth daily. 90 tablet 3  . traMADol (ULTRAM) 50 MG tablet TAKE 1 TABLET BY MOUTH EVERY 12 HOURS AS NEEDED FOR MODERATE PAIN 30 tablet 0   No current facility-administered medications for this visit.     Allergies:   Codeine and Penicillins   Social History:  The patient  reports that she quit smoking about 2 years ago. Her smoking use included cigarettes. She started smoking about 42 years ago. She has a 9.75 pack-year smoking history. She has never used smokeless tobacco. She reports current alcohol use of about 21.0 standard drinks of alcohol per week. She reports current drug use. Drug: Marijuana.   Family History:  The patient's family history includes Anuerysm in her father; Colon cancer in her brother; Heart disease in her mother; Lung cancer in her brother; Prostate cancer in her brother.    ROS:  Please see the history of present illness.   Otherwise, review of systems is positive for ***.   All other systems are reviewed and negative.   PHYSICAL EXAM: VS:  There were no vitals taken for this visit. , BMI There is no height or weight on file to calculate BMI. GEN: Well nourished, well developed, in no acute distress  HEENT: normal  Neck: no JVD, carotid bruits, or masses Cardiac: ***RRR; no murmurs, rubs, or gallops,no edema  Respiratory:  clear to auscultation bilaterally, normal work of breathing GI: soft, nontender, nondistended, + BS MS:  no deformity or atrophy  Skin: warm and dry Neuro:  Strength and sensation are intact Psych: euthymic mood, full affect  EKG:  EKG {ACTION; IS/IS [id number]} ordered today. Personal review of the ekg ordered *** shows ***   Recent Labs: 09/02/2018: ALT 29; BUN 14; Creatinine, Ser 0.90; Hemoglobin 16.0; Platelets 292; Potassium 3.1; Sodium 140    Lipid Panel     Component Value Date/Time   CHOL 276 (H) 09/03/2018 1549   TRIG 378 (H) 09/03/2018 1549   HDL 41 09/03/2018 1549   CHOLHDL 6.7 (H) 09/03/2018 1549   CHOLHDL 6.3 06/15/2010 0530   VLDL UNABLE TO CALCULATE IF TRIGLYCERIDE OVER 400 mg/dL 06/15/2010 0530   LDLCALC 159 (H) 09/03/2018 1549  Wt Readings from Last 3 Encounters:  09/23/18 146 lb (66.2 kg)  09/03/18 153 lb 2 oz (69.5 kg)  06/16/18 158 lb (71.7 kg)      Other studies Reviewed: Additional studies/ records that were reviewed today include: TTE 05/24/16  Review of the above records today demonstrates:  - Left ventricle: The cavity size was normal. Wall thickness was   increased in a pattern of mild LVH. Systolic function was   vigorous. The estimated ejection fraction was in the range of 65%   to 70%. Wall motion was normal; there were no regional wall   motion abnormalities. Doppler parameters are consistent with   abnormal left ventricular relaxation (grade 1 diastolic   dysfunction). - Aortic valve: There was trivial regurgitation.  Myoview 06/13/17  Nuclear stress EF: 70%.  The left ventricular ejection fraction is hyperdynamic (>65%).  No T wave inversion was noted during stress.  There was no ST segment deviation noted during stress.   Normal perfusion. LVEF 70% with normal wall motion. This is a low risk study.  ASSESSMENT AND PLAN:  1.  Chest pain: ***  2. Hypertension:***  3. Hyperlipidemia: Continue high-dose statin  4.  Cryptogenic stroke: Patient has significant disability she did not receive rapid therapy.  She would benefit from  Linq implant.  Risks and benefits were discussed and include bleeding and infection.***  Current medicines are reviewed at length with the patient today.   The patient does not have concerns regarding her medicines.  The following changes were made today: ***  Labs/ tests ordered today include:  No orders of the defined types were placed in this encounter.    Disposition:   FU with   *** year  Signed,  Meredith Leeds, MD  12/24/2018 10:54 AM     [facility] [address] [place] [address] [phone] (office) [phone] (fax)

## 2019-01-01 ENCOUNTER — Other Ambulatory Visit: Payer: Self-pay

## 2019-01-01 NOTE — Patient Outreach (Signed)
[facility]) Care Management  01/01/2019  JISELL ARRUE 1945/12/13 [id number]   Medication Adherence call to Mrs. Judie Bonus HIPPA Compliant Voice message left with a call back number. Mrs. Kreitzer is showing past due on Rosuvastatin 40 mgunder [place].   [place] Management Direct Dial [phone]  Fax [phone] [email or web address]

## 2019-01-16 ENCOUNTER — Other Ambulatory Visit: Payer: Self-pay

## 2019-01-16 DIAGNOSIS — M170 Bilateral primary osteoarthritis of knee: Secondary | ICD-10-CM

## 2019-01-17 MED ORDER — TRAMADOL HCL 50 MG PO TABS: ORAL_TABLET | ORAL | 0 refills | Status: DC

## 2019-01-17 NOTE — Telephone Encounter (Signed)
The daughter of Margaret Rangel called to request a refill on her mother's tramadol.  She reports that she called and spoke with a physician yesterday (she places Dr. Milus Banister) and was expecting a refill but none was available at the pharmacy.  She was calling to see if there is an issue refilling this medication and if it could be recent.  She reports that her mother has taken tramadol in the past without any issue and that she does not have any allergic reaction to it.  She is aware that this medication should not be taken if her mother is experiencing any confusion.  PMDP was reviewed the last refill was noted 1 month ago.  30 pill supply of tramadol 50mg  twice daily as needed was sent to the preferred pharmacy.  Matilde Haymaker, MD

## 2019-01-19 NOTE — Telephone Encounter (Signed)
Collier [place], thank you so much for taking care of this while I was out sick, I really appreciate it.  Milus Banister, [place], PGY-2 01/19/2019 8:53 AM

## 2019-01-22 NOTE — Progress Notes (Deleted)
NEUROLOGY FOLLOW UP OFFICE NOTE  LAVETA GILKEY [id number]  HISTORY OF PRESENT ILLNESS: Margaret Rangel is a 73 year old right-handed black woman with asthma, hypertension, hyperlipidemia, IBS, type 2 diabetes mellitus, chronic back pain and history of strokes who follows up for cerebrovascular accident.  UPDATE: Current medications:  ASA 101m daily; Crestor 438m Zetia; Coreg; Amaryl.  ***  HISTORY: In May, she developed a left-sided headache with associated slurred speech.  There was no associated visual disturbance, unsteady gait, dizziness, or unilateral numbness or weakness.  She didn't seek immediate medical attention.  Over the next few days, symptoms gradually improved.  She had a virtual visit with her PCP 5 days later, on 09/02/18, who told her to go to the ED.  CT of head personally reviewed showed hypodensity in the left frontal lobe suspicious for acute infarct as well as age-indeterminate strokes in the bilateral basal ganglia.  MRI of brain without contrast showed multiple small acute strokes in the left frontal region as well as chronic small vessel ischemic changes and remote lacunar infarcts involving both basal ganglia, thalami and pons.  Hospital admission was recommended but patient and her family declined.  Labs from 09/03/18 showed lipid panel with total cholesterol 276, TG 378, HDL 41 and LDL 159.  Hgb A1c was 7.4%.  She later had an outpatient carotid doppler on 09/17/18 which demonstrated 40-59% stenosis in the right internal carotid artery and 1-39% stenosis in the left internal carotid artery as well as over 50% stenosis in the right ECA, left CCA and left ECA.  2D echocardiogram from 09/19/18 demonstrated LVEF 60-65% with no atrial level shunt detected by color flow Doppler.  Cardiology is now discussing Linq monitoring.  She had not been on antiplatelet therapy due to GI bleed in 2014.  Since this recent stroke, she has started ASA 8181maily.  She was restarted on  Crestor (previously stopped it). She has prior history of stroke with stroke risk factors including hypertension, hyperlipidemia, type 2 diabetes mellitus, former cigarette smoker.  Speech has improved.  She is to start speech therapy on Wednesday.    PAST MEDICAL HISTORY: Past Medical History:  Diagnosis Date  . Abdominal pain   . Anal fissure   . Arthritis   . Asthma   . Chronic back pain   . Colon adenoma 2009, 2012   Colonoscopy  . Diverticulosis of colon (without mention of hemorrhage) 2009   Colonoscopy  . DM (diabetes mellitus) [place]  off medicines for diabetes 12-18-13  . GERD (gastroesophageal reflux disease)   . Hemorrhoids   . Hernia   . HTN (hypertension)   . Hyperlipidemia    low  . Irritable bowel syndrome (IBS)   . Low blood potassium   . Obese   . Stroke ([place]  x2  . Ulcer     MEDICATIONS: Current Outpatient Medications on File Prior to Visit  Medication Sig Dispense Refill  . albuterol (PROVENTIL HFA;VENTOLIN HFA) 108 (90 Base) MCG/ACT inhaler Inhale 2 puffs into the lungs every 6 (six) hours as needed for wheezing or shortness of breath. 1 Inhaler 2  . aspirin (EQ ASPIRIN ADULT LOW DOSE) 81 MG EC tablet Take 1 tablet (81 mg total) by mouth daily. 90 tablet 3  . Blood Glucose Calibration (ONETOUCH VERIO) High SOLN 1 each by In Vitro route as directed. 1 each 6  . Blood Glucose Calibration (ONETOUCH VERIO) SOLN 1 Bottle by In Vitro route as directed. 1  each 6  . Blood Glucose Monitoring Suppl (ONETOUCH VERIO) w/Device KIT 1 kit by Does not apply route 3 (three) times daily. ICD-10 code: E11.9 1 kit 0  . carvedilol (COREG) 3.125 MG tablet Take 1 tablet (3.125 mg total) by mouth 2 (two) times daily. 180 tablet 0  . docusate sodium (COLACE) 100 MG capsule Take 1 capsule (100 mg total) by mouth every 12 (twelve) hours. 60 capsule 0  . ezetimibe (ZETIA) 10 MG tablet Take 1 tablet (10 mg total) by mouth daily. 90 tablet 3  . Ferrous Sulfate (IRON) 325 (65 Fe)  MG TABS Take 1 tablet (325 mg total) by mouth 2 (two) times daily with a meal. 90 tablet 0  . glimepiride (AMARYL) 2 MG tablet Take 1 tablet (2 mg total) by mouth daily with breakfast. TAKE 1 TABLET BY MOUTH ONCE DAILY WITH BREAKFAST 30 tablet 2  . glucose blood (ONETOUCH VERIO) test strip Use as instructed to test glucose level three times daily. ICD-10 code: E11.9 100 each 12  . Lancet Devices (ONE TOUCH DELICA LANCING DEV) MISC 1 Device by Does not apply route as directed. ICD-10 code: E11.9 1 each 0  . pantoprazole (PROTONIX) 40 MG tablet Take 1 tablet (40 mg total) by mouth daily. 90 tablet 3  . polyethylene glycol (MIRALAX / GLYCOLAX) 17 g packet Take 17 g by mouth daily. 14 each 0  . rosuvastatin (CRESTOR) 40 MG tablet Take 1 tablet (40 mg total) by mouth daily. 90 tablet 3  . traMADol (ULTRAM) 50 MG tablet TAKE 1 TABLET BY MOUTH EVERY 12 HOURS AS NEEDED FOR MODERATE PAIN 30 tablet 0   No current facility-administered medications on file prior to visit.     ALLERGIES: Allergies  Allergen Reactions  . Codeine Itching  . Penicillins Itching    Has patient had a PCN reaction causing immediate rash, facial/tongue/throat swelling, SOB or lightheadedness with hypotension:No Has patient had a PCN reaction causing severe rash involving mucus membranes or skin necrosis:No Has patient had a PCN reaction that required hopititalization:No Has patient had a PCN reaction occurring within the last 10 years:No If all of the above answers are "NO", then may proceed with Cephalosporin use.     FAMILY HISTORY: Family History  Problem Relation Age of Onset  . Prostate cancer Brother   . Heart disease Mother   . Anuerysm Father   . Lung cancer Brother   . Colon cancer Brother   . Rectal cancer Neg Hx   . Stomach cancer Neg Hx   . Esophageal cancer Neg Hx    SOCIAL HISTORY: Social History   Socioeconomic History  . Marital status: Single    Spouse name: Not on file  . Number of children:  3  . Years of education: Not on file  . Highest education level: Not on file  Occupational History  . Occupation: UNEMPLOYED    Employer: UNEMPLOYED  Social Needs  . Financial resource strain: Not on file  . Food insecurity    Worry: Not on file    Inability: Not on file  . Transportation needs    Medical: Not on file    Non-medical: Not on file  Tobacco Use  . Smoking status: Former Smoker    Packs/day: 0.25    Years: 39.00    Pack years: 9.75    Types: Cigarettes    Start date: 04/16/1976    Quit date: 02/13/2016    Years since quitting: 2.9  . Smokeless tobacco:  Never Used  . Tobacco comment: 4-5 cigs per day for 39 years  Substance and Sexual Activity  . Alcohol use: Yes    Alcohol/week: 21.0 standard drinks    Types: 21 Cans of beer per week    Comment: quit 2 years ago  . Drug use: Yes    Types: Marijuana    Comment: using for depression// last used this morning 4am  . Sexual activity: Not Currently  Lifestyle  . Physical activity    Days per week: Not on file    Minutes per session: Not on file  . Stress: Not on file  Relationships  . Social Herbalist on phone: Not on file    Gets together: Not on file    Attends religious service: Not on file    Active member of club or organization: Not on file    Attends meetings of clubs or organizations: Not on file    Relationship status: Not on file  . Intimate partner violence    Fear of current or ex partner: Not on file    Emotionally abused: Not on file    Physically abused: Not on file    Forced sexual activity: Not on file  Other Topics Concern  . Not on file  Social History Narrative  . Not on file    REVIEW OF SYSTEMS: Constitutional: No fevers, chills, or sweats, no generalized fatigue, change in appetite Eyes: No visual changes, double vision, eye pain Ear, nose and throat: No hearing loss, ear pain, nasal congestion, sore throat Cardiovascular: No chest pain, palpitations Respiratory:   No shortness of breath at rest or with exertion, wheezes GastrointestinaI: No nausea, vomiting, diarrhea, abdominal pain, fecal incontinence Genitourinary:  No dysuria, urinary retention or frequency Musculoskeletal:  No neck pain, back pain Integumentary: No rash, pruritus, skin lesions Neurological: as above Psychiatric: No depression, insomnia, anxiety Endocrine: No palpitations, fatigue, diaphoresis, mood swings, change in appetite, change in weight, increased thirst Hematologic/Lymphatic:  No purpura, petechiae. Allergic/Immunologic: no itchy/runny eyes, nasal congestion, recent allergic reactions, rashes  PHYSICAL EXAM: *** General: No acute distress.  Patient appears ***-groomed.   Head:  Normocephalic/atraumatic Eyes:  Fundi examined but not visualized Neck: supple, no paraspinal tenderness, full range of motion Heart:  Regular rate and rhythm Lungs:  Clear to auscultation bilaterally Back: No paraspinal tenderness Neurological Exam: alert and oriented to person, place, and time. Attention span and concentration intact, recent and remote memory intact, fund of knowledge intact.  Mixed fluency of speech, mildly dysarthric.  Mild difficulty with naming and repeating.  CN II-XII intact. Bulk and tone normal, muscle strength 5-/5 throughout.  Sensation to light touch  intact.  Deep tendon reflexes 1+ in upper extremities, absent in lower extremities  Finger to nose testing intact.  Cautious mildly wide-based gait.  Romberg negative   IMPRESSION: 1.  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  Type 2 diabetes mellitus 4.  Hypertension 5.  Hyperlipidemia  PLAN: 1.  ASA 51m daily for secondary stroke prevention 2.  Crestor 429mdaily.  LDL goal less than 70 3.  Optimize blood pressure control 4.  Optimize glycemic control.   5.  Long-term cardiac event monitoring  AdMetta ClinesDO  CC:  HaMilus Banister DO

## 2019-01-23 ENCOUNTER — Telehealth: Payer: Self-pay | Admitting: Neurology

## 2019-01-23 ENCOUNTER — Ambulatory Visit: Payer: Medicare Other | Admitting: Family Medicine

## 2019-01-23 NOTE — Telephone Encounter (Signed)
No answer at 424

## 2019-01-23 NOTE — Telephone Encounter (Signed)
Patient daughter states the patient will need refills on all of her medication that Dr Tomi Likens and given her sent to the [facility]

## 2019-01-26 ENCOUNTER — Ambulatory Visit: Payer: Medicare Other | Admitting: Neurology

## 2019-01-26 ENCOUNTER — Other Ambulatory Visit: Payer: Self-pay

## 2019-01-26 ENCOUNTER — Telehealth (INDEPENDENT_AMBULATORY_CARE_PROVIDER_SITE_OTHER): Payer: Medicare Other | Admitting: Family Medicine

## 2019-01-26 DIAGNOSIS — K625 Hemorrhage of anus and rectum: Secondary | ICD-10-CM | POA: Diagnosis not present

## 2019-01-26 DIAGNOSIS — M549 Dorsalgia, unspecified: Secondary | ICD-10-CM | POA: Insufficient documentation

## 2019-01-26 DIAGNOSIS — G8929 Other chronic pain: Secondary | ICD-10-CM | POA: Diagnosis not present

## 2019-01-26 DIAGNOSIS — I10 Essential (primary) hypertension: Secondary | ICD-10-CM | POA: Diagnosis not present

## 2019-01-26 DIAGNOSIS — M545 Low back pain: Secondary | ICD-10-CM | POA: Diagnosis not present

## 2019-01-26 DIAGNOSIS — M170 Bilateral primary osteoarthritis of knee: Secondary | ICD-10-CM

## 2019-01-26 MED ORDER — NAPROXEN 500 MG PO TABS: 500.0000 mg | ORAL_TABLET | Freq: Two times a day (BID) | ORAL | 0 refills | Status: AC | PRN

## 2019-01-26 MED ORDER — IRON 325 (65 FE) MG PO TABS: 325.0000 mg | ORAL_TABLET | Freq: Two times a day (BID) | ORAL | 0 refills | Status: AC

## 2019-01-26 MED ORDER — CARVEDILOL 3.125 MG PO TABS: 3.1250 mg | ORAL_TABLET | Freq: Two times a day (BID) | ORAL | 0 refills | Status: DC

## 2019-01-26 NOTE — Assessment & Plan Note (Signed)
Refilled her Coreg 3.125 mg twice daily.  Recommended follow-up with her primary care provider within the next few weeks to ensure stability of hypertension to reduce risk of recurrent CVA.

## 2019-01-26 NOTE — Assessment & Plan Note (Signed)
Acute worsening in the setting of weather changes, likely in the setting of underlying osteoarthritis.  No red flags through history.  Limited pain control options, would like to avoid further opioid use given confusion associated with tramadol and patient endorses intolerance to Tylenol.  Discussed risks and benefits of trialing NSAID therapy, including cardiovascular risk for MI/stroke given her previous CVA and bleeding, however they would like to proceed for acute pain relief.  Will send in naproxen 500 mg twice daily.  Additionally counseled to continue topical therapies concurrently with heat/ice and lumbar exercises, due to follow-up for spine injections in the next month.  Follow-up if pain not improving or sooner if worsening.

## 2019-01-26 NOTE — Assessment & Plan Note (Signed)
Controlled with last A1c 7.4 in 08/2018.  Currently taking glimepiride for control.  Recommended, as mentioned above, follow-up with PCP in the next few weeks for diabetes management.  Consider transitioning regimen to SGLT-2 inhibitor for cardiovascular benefits (do not believe she would like to start injectables such as GLP-1) rather than sulfonylureas associated with hypoglycemia.

## 2019-01-26 NOTE — Progress Notes (Signed)
[facility] Telemedicine Visit  Patient consented to have virtual visit. Method of visit: Video was attempted, but technology challenges prevented patient from using video, so visit was conducted via telephone.  Encounter participants: Patient: [facility] Rangel - located at Home Provider: Patriciaann Clan - located at [facility] Others (if applicable): Caretaker,   Chief Complaint: Med refill  HPI: [facility]angel is a 73 year old female presenting discuss the following:  Med refill: Says she thinks there is a lot of medication she needs refills on.  Also believe she needs a refill of her carvedilol.  Chronic back/knee pain: Has been acting up recently, seems to get worse with the cold weather.  Denies any weakness, numbness, bowel/bladder incontinence/retention, saddle anesthesia.  No radicular symptoms.  She has been taking tramadol to help with this, ineffective and doesn't want to use it anymore.  Caretaker also notices that it seems to make her a little bit more confused.  Trying to exercise with ROM movements which is helping somewhat.  States she cannot take Tylenol because it makes her feel "sick."  Also has tried topical therapies that have been ineffective in the past.  She is hoping to have something else to help with her pain.  Receives injections in her spine through specialist, cannot see them until next month.  ROS: per HPI  Pertinent PMHx: Recent CVA in 08/2018, asthma, GERD, diabetes, hypertension   Exam:  Respiratory: Unlabored breathing, able to speak in full sentences  Assessment/Plan:  Chronic back pain Acute worsening in the setting of weather changes, likely in the setting of underlying osteoarthritis.  No red flags through history.  Limited pain control options, would like to avoid further opioid use given confusion associated with tramadol and patient endorses intolerance to Tylenol.  Discussed risks and benefits of trialing NSAID therapy, including  cardiovascular risk for MI/stroke given her previous CVA and bleeding, however they would like to proceed for acute pain relief.  Will send in naproxen 500 mg twice daily.  Additionally counseled to continue topical therapies concurrently with heat/ice and lumbar exercises, due to follow-up for spine injections in the next month.  Follow-up if pain not improving or sooner if worsening.  Essential hypertension Refilled her Coreg 3.125 mg twice daily.  Recommended follow-up with her primary care provider within the next few weeks to ensure stability of hypertension to reduce risk of recurrent CVA.  Diabetes mellitus without mention of complication Controlled with last A1c 7.4 in 08/2018.  Currently taking glimepiride for control.  Recommended, as mentioned above, follow-up with PCP in the next few weeks for diabetes management.  Consider transitioning regimen to SGLT-2 inhibitor for cardiovascular benefits (do not believe she would like to start injectables such as GLP-1) rather than sulfonylureas associated with hypoglycemia.    Reviewed medication list, informed patient and caretaker that she had several refills on most of her medications and to call her pharmacy to have these refilled.  Refilled Coreg as stated above and ferrous sulfate.  Time spent during visit with patient: 12 minutes

## 2019-01-31 ENCOUNTER — Other Ambulatory Visit: Payer: Self-pay | Admitting: Family Medicine

## 2019-02-03 NOTE — Progress Notes (Signed)
Virtual Visit via Telephone Note The purpose of this virtual visit is to provide medical care while limiting exposure to the novel coronavirus.    Consent was obtained for phone visit:  Yes.   Answered questions that patient had about telehealth interaction:  Yes.   I discussed the limitations, risks, security and privacy concerns of performing an evaluation and management service by telephone. I also discussed with the patient that there may be a patient responsible charge related to this service. The patient expressed understanding and agreed to proceed.  Pt location: Home Physician Location: office Name of referring provider:  Marjie Skiff, MD I connected with .[facility] at patients initiation/request on 02/06/2019 at  2:30 PM EDT by telephone and verified that I am speaking with the correct person using two identifiers.  Pt MRN:  [id number] Pt DOB:  1945-12-07   History of Present Illness:  Margaret Rangel is a 73 year old right-handed black woman with asthma, hypertension, hyperlipidemia, IBS, type 2 diabetes mellitus, chronic back pain and history of strokes who follows up for stroke.  UPDATE: Current medications:  ASA 81mg ; Crestor 40mg , Zetia, Coreg, glimepiride, tramadol  HISTORY: In May 2020, she developed a left-sided headache with associated slurred speech.  There was no associated visual disturbance, unsteady gait, dizziness, or unilateral numbness or weakness.  She didn't seek immediate medical attention.  Over the next few days, symptoms gradually improved.  She had a virtual visit with her PCP 5 days later, on 09/02/18, who told her to go to the ED.  CT of head personally reviewed showed hypodensity in the left frontal lobe suspicious for acute infarct as well as age-indeterminate strokes in the bilateral basal ganglia.  MRI of brain without contrast showed multiple small acute strokes in the left frontal region as well as chronic small vessel ischemic changes and  remote lacunar infarcts involving both basal ganglia, thalami and pons.  Hospital admission was recommended but patient and her family declined.  Labs from 09/03/18 showed lipid panel with total cholesterol 276, TG 378, HDL 41 and LDL 159.  Hgb A1c was 7.4%.  She later had an outpatient carotid doppler on 09/17/18 which demonstrated 40-59% stenosis in the right internal carotid artery and 1-39% stenosis in the left internal carotid artery as well as over 50% stenosis in the right ECA, left CCA and left ECA.  2D echocardiogram from 09/19/18 demonstrated LVEF 60-65% with no atrial level shunt detected by color flow Doppler.  Cardiology is now discussing Linq monitoring.  She had not been on antiplatelet therapy due to GI bleed in 2014.  Since this recent stroke, she has started ASA 81mg  daily.  She was restarted on Crestor (previously stopped it). She has prior history of stroke with stroke risk factors including hypertension, hyperlipidemia, type 2 diabetes mellitus, former cigarette smoker.    Observations/Objective:   speech with mixed fluency, mildly dysarthric,   Assessment and Plan:   1.  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 2.  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  Type 2 diabetes mellitus 4.  Hypertension 5.  Hyperlipidemia  1.  ASA 81mg  daily for secondary stroke prevention.  2.  Crestor (LDL goal less than 70) 3.  Glycemic control 4.  Blood pressure control 5.  Follow up in 6 months.  Follow Up Instructions:    -I discussed the assessment and treatment plan with the patient. The patient was provided an opportunity to ask  questions and all were answered. The patient agreed with the plan and demonstrated an understanding of the instructions.   The patient was advised to call back or seek an in-person evaluation if the symptoms worsen or if the condition fails to improve as anticipated.    Total Time spent in visit with the patient was:  13  minutes  Dudley Major, DO

## 2019-02-06 ENCOUNTER — Other Ambulatory Visit: Payer: Self-pay

## 2019-02-06 ENCOUNTER — Encounter: Payer: Self-pay | Admitting: Neurology

## 2019-02-06 ENCOUNTER — Telehealth (INDEPENDENT_AMBULATORY_CARE_PROVIDER_SITE_OTHER): Payer: Medicare Other | Admitting: Neurology

## 2019-02-06 VITALS — Ht 62.0 in | Wt 146.0 lb

## 2019-02-06 DIAGNOSIS — E785 Hyperlipidemia, unspecified: Secondary | ICD-10-CM

## 2019-02-06 DIAGNOSIS — I63412 Cerebral infarction due to embolism of left middle cerebral artery: Secondary | ICD-10-CM

## 2019-02-06 DIAGNOSIS — I10 Essential (primary) hypertension: Secondary | ICD-10-CM

## 2019-02-06 DIAGNOSIS — E1169 Type 2 diabetes mellitus with other specified complication: Secondary | ICD-10-CM

## 2019-02-06 DIAGNOSIS — I69320 Aphasia following cerebral infarction: Secondary | ICD-10-CM | POA: Diagnosis not present

## 2019-02-18 ENCOUNTER — Ambulatory Visit: Payer: Medicare Other | Admitting: Family Medicine

## 2019-02-26 DIAGNOSIS — M5440 Lumbago with sciatica, unspecified side: Secondary | ICD-10-CM | POA: Diagnosis not present

## 2019-02-27 ENCOUNTER — Encounter (HOSPITAL_COMMUNITY): Payer: Self-pay | Admitting: Emergency Medicine

## 2019-02-27 ENCOUNTER — Emergency Department (HOSPITAL_COMMUNITY): Payer: Medicare Other

## 2019-02-27 ENCOUNTER — Telehealth: Payer: Self-pay | Admitting: Neurology

## 2019-02-27 ENCOUNTER — Inpatient Hospital Stay (HOSPITAL_COMMUNITY)
Admission: EM | Admit: 2019-02-27 | Discharge: 2019-03-03 | DRG: 066 | Disposition: A | Discharge status: Home / Self Care | Payer: Medicare Other | Attending: Internal Medicine | Admitting: Internal Medicine

## 2019-02-27 ENCOUNTER — Inpatient Hospital Stay (HOSPITAL_COMMUNITY): Payer: Medicare Other

## 2019-02-27 DIAGNOSIS — K219 Gastro-esophageal reflux disease without esophagitis: Secondary | ICD-10-CM | POA: Diagnosis not present

## 2019-02-27 DIAGNOSIS — I10 Essential (primary) hypertension: Secondary | ICD-10-CM | POA: Diagnosis present

## 2019-02-27 DIAGNOSIS — Z8673 Personal history of transient ischemic attack (TIA), and cerebral infarction without residual deficits: Secondary | ICD-10-CM | POA: Diagnosis not present

## 2019-02-27 DIAGNOSIS — E785 Hyperlipidemia, unspecified: Secondary | ICD-10-CM | POA: Diagnosis not present

## 2019-02-27 DIAGNOSIS — F1210 Cannabis abuse, uncomplicated: Secondary | ICD-10-CM | POA: Diagnosis present

## 2019-02-27 DIAGNOSIS — M170 Bilateral primary osteoarthritis of knee: Secondary | ICD-10-CM | POA: Diagnosis present

## 2019-02-27 DIAGNOSIS — Z8249 Family history of ischemic heart disease and other diseases of the circulatory system: Secondary | ICD-10-CM

## 2019-02-27 DIAGNOSIS — Z9089 Acquired absence of other organs: Secondary | ICD-10-CM

## 2019-02-27 DIAGNOSIS — I6523 Occlusion and stenosis of bilateral carotid arteries: Secondary | ICD-10-CM | POA: Diagnosis not present

## 2019-02-27 DIAGNOSIS — Z8042 Family history of malignant neoplasm of prostate: Secondary | ICD-10-CM

## 2019-02-27 DIAGNOSIS — I639 Cerebral infarction, unspecified: Secondary | ICD-10-CM | POA: Diagnosis not present

## 2019-02-27 DIAGNOSIS — M810 Age-related osteoporosis without current pathological fracture: Secondary | ICD-10-CM | POA: Diagnosis present

## 2019-02-27 DIAGNOSIS — E119 Type 2 diabetes mellitus without complications: Secondary | ICD-10-CM

## 2019-02-27 DIAGNOSIS — R42 Dizziness and giddiness: Secondary | ICD-10-CM | POA: Diagnosis not present

## 2019-02-27 DIAGNOSIS — Z79891 Long term (current) use of opiate analgesic: Secondary | ICD-10-CM

## 2019-02-27 DIAGNOSIS — Z7982 Long term (current) use of aspirin: Secondary | ICD-10-CM

## 2019-02-27 DIAGNOSIS — E1159 Type 2 diabetes mellitus with other circulatory complications: Secondary | ICD-10-CM | POA: Diagnosis not present

## 2019-02-27 DIAGNOSIS — Z90711 Acquired absence of uterus with remaining cervical stump: Secondary | ICD-10-CM | POA: Diagnosis not present

## 2019-02-27 DIAGNOSIS — Z885 Allergy status to narcotic agent status: Secondary | ICD-10-CM

## 2019-02-27 DIAGNOSIS — M549 Dorsalgia, unspecified: Secondary | ICD-10-CM | POA: Diagnosis present

## 2019-02-27 DIAGNOSIS — I69322 Dysarthria following cerebral infarction: Secondary | ICD-10-CM

## 2019-02-27 DIAGNOSIS — I358 Other nonrheumatic aortic valve disorders: Secondary | ICD-10-CM | POA: Diagnosis not present

## 2019-02-27 DIAGNOSIS — G8929 Other chronic pain: Secondary | ICD-10-CM | POA: Diagnosis not present

## 2019-02-27 DIAGNOSIS — I6389 Other cerebral infarction: Secondary | ICD-10-CM | POA: Diagnosis not present

## 2019-02-27 DIAGNOSIS — J45909 Unspecified asthma, uncomplicated: Secondary | ICD-10-CM | POA: Diagnosis not present

## 2019-02-27 DIAGNOSIS — K0889 Other specified disorders of teeth and supporting structures: Secondary | ICD-10-CM | POA: Diagnosis not present

## 2019-02-27 DIAGNOSIS — I351 Nonrheumatic aortic (valve) insufficiency: Secondary | ICD-10-CM | POA: Diagnosis not present

## 2019-02-27 DIAGNOSIS — I119 Hypertensive heart disease without heart failure: Secondary | ICD-10-CM | POA: Diagnosis not present

## 2019-02-27 DIAGNOSIS — R29702 NIHSS score 2: Secondary | ICD-10-CM | POA: Diagnosis present

## 2019-02-27 DIAGNOSIS — Z5329 Procedure and treatment not carried out because of patient's decision for other reasons: Secondary | ICD-10-CM | POA: Diagnosis not present

## 2019-02-27 DIAGNOSIS — R000 Tachycardia, unspecified: Secondary | ICD-10-CM | POA: Diagnosis not present

## 2019-02-27 DIAGNOSIS — R404 Transient alteration of awareness: Secondary | ICD-10-CM | POA: Diagnosis not present

## 2019-02-27 DIAGNOSIS — R270 Ataxia, unspecified: Secondary | ICD-10-CM | POA: Diagnosis not present

## 2019-02-27 DIAGNOSIS — D72829 Elevated white blood cell count, unspecified: Secondary | ICD-10-CM | POA: Diagnosis present

## 2019-02-27 DIAGNOSIS — Z86010 Personal history of colonic polyps: Secondary | ICD-10-CM

## 2019-02-27 DIAGNOSIS — R52 Pain, unspecified: Secondary | ICD-10-CM | POA: Diagnosis not present

## 2019-02-27 DIAGNOSIS — R0689 Other abnormalities of breathing: Secondary | ICD-10-CM | POA: Diagnosis not present

## 2019-02-27 DIAGNOSIS — Z800 Family history of malignant neoplasm of digestive organs: Secondary | ICD-10-CM

## 2019-02-27 DIAGNOSIS — R4181 Age-related cognitive decline: Secondary | ICD-10-CM | POA: Diagnosis present

## 2019-02-27 DIAGNOSIS — Z801 Family history of malignant neoplasm of trachea, bronchus and lung: Secondary | ICD-10-CM

## 2019-02-27 DIAGNOSIS — Z87891 Personal history of nicotine dependence: Secondary | ICD-10-CM

## 2019-02-27 DIAGNOSIS — Z880 Allergy status to penicillin: Secondary | ICD-10-CM

## 2019-02-27 DIAGNOSIS — Z79899 Other long term (current) drug therapy: Secondary | ICD-10-CM

## 2019-02-27 DIAGNOSIS — Z7984 Long term (current) use of oral hypoglycemic drugs: Secondary | ICD-10-CM

## 2019-02-27 DIAGNOSIS — G43109 Migraine with aura, not intractable, without status migrainosus: Secondary | ICD-10-CM | POA: Diagnosis not present

## 2019-02-27 DIAGNOSIS — Z743 Need for continuous supervision: Secondary | ICD-10-CM | POA: Diagnosis not present

## 2019-02-27 DIAGNOSIS — I6603 Occlusion and stenosis of bilateral middle cerebral arteries: Secondary | ICD-10-CM | POA: Diagnosis not present

## 2019-02-27 LAB — BASIC METABOLIC PANEL
Anion gap: 14 (ref 5–15)
BUN: 16 mg/dL (ref 8–23)
CO2: 23 mmol/L (ref 22–32)
Calcium: 9.5 mg/dL (ref 8.9–10.3)
Chloride: 103 mmol/L (ref 98–111)
Creatinine, Ser: 0.86 mg/dL (ref 0.44–1.00)
GFR calc Af Amer: >60 mL/min (ref >60)
GFR calc non Af Amer: >60 mL/min (ref >60)
Glucose, Bld: 143 mg/dL — ABNORMAL HIGH (ref 70–99)
Potassium: 3.5 mmol/L (ref 3.5–5.1)
Sodium: 140 mmol/L (ref 135–145)

## 2019-02-27 LAB — CBC
HCT: 38 % (ref 36.0–46.0)
Hemoglobin: 12.6 g/dL (ref 12.0–15.0)
MCH: 26.6 pg (ref 26.0–34.0)
MCHC: 33.2 g/dL (ref 30.0–36.0)
MCV: 80.2 fL (ref 80.0–100.0)
Platelets: 279 10*3/uL (ref 150–400)
RBC: 4.74 MIL/uL (ref 3.87–5.11)
RDW: 15.4 % (ref 11.5–15.5)
WBC: 15 10*3/uL — ABNORMAL HIGH (ref 4.0–10.5)
nRBC: 0 % (ref 0.0–0.2)

## 2019-02-27 LAB — CBC WITH DIFFERENTIAL/PLATELET
Abs Immature Granulocytes: 0.06 10*3/uL (ref 0.00–0.07)
Basophils Absolute: 0 10*3/uL (ref 0.0–0.1)
Basophils Relative: 0 %
Eosinophils Absolute: 0 10*3/uL (ref 0.0–0.5)
Eosinophils Relative: 0 %
HCT: 39.4 % (ref 36.0–46.0)
Hemoglobin: 13 g/dL (ref 12.0–15.0)
Immature Granulocytes: 0 %
Lymphocytes Relative: 19 %
Lymphs Abs: 2.9 10*3/uL (ref 0.7–4.0)
MCH: 26.5 pg (ref 26.0–34.0)
MCHC: 33 g/dL (ref 30.0–36.0)
MCV: 80.4 fL (ref 80.0–100.0)
Monocytes Absolute: 1 10*3/uL (ref 0.1–1.0)
Monocytes Relative: 7 %
Neutro Abs: 11.4 10*3/uL — ABNORMAL HIGH (ref 1.7–7.7)
Neutrophils Relative %: 74 %
Platelets: 304 10*3/uL (ref 150–400)
RBC: 4.9 MIL/uL (ref 3.87–5.11)
RDW: 15.2 % (ref 11.5–15.5)
WBC: 15.3 10*3/uL — ABNORMAL HIGH (ref 4.0–10.5)
nRBC: 0 % (ref 0.0–0.2)

## 2019-02-27 LAB — CREATININE, SERUM
Creatinine, Ser: 0.86 mg/dL (ref 0.44–1.00)
GFR calc Af Amer: >60 mL/min (ref >60)
GFR calc non Af Amer: >60 mL/min (ref >60)

## 2019-02-27 LAB — GLUCOSE, CAPILLARY: Glucose-Capillary: 160 mg/dL — ABNORMAL HIGH (ref 70–99)

## 2019-02-27 MED ORDER — LORAZEPAM 1 MG PO TABS
1.0000 mg | ORAL_TABLET | Freq: Once | ORAL | Status: AC
  Administered 2019-02-27: 16:00:00 1 mg via ORAL
  Filled 2019-02-27: qty 1

## 2019-02-27 MED ORDER — LORAZEPAM 1 MG PO TABS
1.0000 mg | ORAL_TABLET | Freq: Once | ORAL | Status: AC
  Administered 2019-02-27: 1 mg via ORAL
  Filled 2019-02-27: qty 1

## 2019-02-27 MED ORDER — STROKE: EARLY STAGES OF RECOVERY BOOK
Freq: Once | Status: AC
  Administered 2019-02-27: 1
  Filled 2019-02-27: qty 1

## 2019-02-27 MED ORDER — EZETIMIBE 10 MG PO TABS
10.0000 mg | ORAL_TABLET | Freq: Every day | ORAL | Status: DC
  Administered 2019-02-28 – 2019-03-03 (×3): 10 mg via ORAL
  Filled 2019-02-27 (×4): qty 1

## 2019-02-27 MED ORDER — CLOPIDOGREL BISULFATE 75 MG PO TABS
300.0000 mg | ORAL_TABLET | Freq: Once | ORAL | Status: AC
  Administered 2019-02-27: 300 mg via ORAL
  Filled 2019-02-27: qty 4

## 2019-02-27 MED ORDER — IOHEXOL 350 MG/ML SOLN
75.0000 mL | Freq: Once | INTRAVENOUS | Status: AC | PRN
  Administered 2019-02-27: 75 mL via INTRAVENOUS

## 2019-02-27 MED ORDER — ACETAMINOPHEN 160 MG/5ML PO SOLN: 650.0000 mg | ORAL | Status: DC | PRN

## 2019-02-27 MED ORDER — ASPIRIN EC 325 MG PO TBEC
325.0000 mg | DELAYED_RELEASE_TABLET | Freq: Every day | ORAL | Status: DC
  Administered 2019-02-28 – 2019-03-03 (×3): 325 mg via ORAL
  Filled 2019-02-27 (×4): qty 1

## 2019-02-27 MED ORDER — INSULIN ASPART 100 UNIT/ML ~~LOC~~ SOLN
0.0000 [IU] | Freq: Three times a day (TID) | SUBCUTANEOUS | Status: DC
  Administered 2019-02-28: 2 [IU] via SUBCUTANEOUS
  Administered 2019-03-01: 3 [IU] via SUBCUTANEOUS

## 2019-02-27 MED ORDER — ROSUVASTATIN CALCIUM 20 MG PO TABS
40.0000 mg | ORAL_TABLET | Freq: Every day | ORAL | Status: DC
  Administered 2019-02-28 – 2019-03-03 (×3): 40 mg via ORAL
  Filled 2019-02-27 (×4): qty 2

## 2019-02-27 MED ORDER — ATORVASTATIN CALCIUM 80 MG PO TABS
80.0000 mg | ORAL_TABLET | Freq: Every day | ORAL | Status: DC
  Administered 2019-02-27: 80 mg via ORAL
  Filled 2019-02-27: qty 1

## 2019-02-27 MED ORDER — ACETAMINOPHEN 325 MG PO TABS
650.0000 mg | ORAL_TABLET | ORAL | Status: DC | PRN
  Administered 2019-03-02: 650 mg via ORAL
  Filled 2019-02-27: qty 2

## 2019-02-27 MED ORDER — CLOPIDOGREL BISULFATE 75 MG PO TABS
75.0000 mg | ORAL_TABLET | Freq: Every day | ORAL | Status: DC
  Administered 2019-02-28 – 2019-03-03 (×3): 75 mg via ORAL
  Filled 2019-02-27 (×4): qty 1

## 2019-02-27 MED ORDER — ASPIRIN EC 325 MG PO TBEC: 325.0000 mg | DELAYED_RELEASE_TABLET | Freq: Every day | ORAL | Status: DC

## 2019-02-27 MED ORDER — INSULIN ASPART 100 UNIT/ML ~~LOC~~ SOLN: 0.0000 [IU] | Freq: Every day | SUBCUTANEOUS | Status: DC

## 2019-02-27 MED ORDER — ENOXAPARIN SODIUM 40 MG/0.4ML ~~LOC~~ SOLN
40.0000 mg | SUBCUTANEOUS | Status: DC
  Administered 2019-02-27 – 2019-03-02 (×3): 40 mg via SUBCUTANEOUS
  Filled 2019-02-27 (×4): qty 0.4

## 2019-02-27 MED ORDER — ACETAMINOPHEN 650 MG RE SUPP: 650.0000 mg | RECTAL | Status: DC | PRN

## 2019-02-27 MED ORDER — ASPIRIN 81 MG PO CHEW
324.0000 mg | CHEWABLE_TABLET | Freq: Once | ORAL | Status: AC
  Administered 2019-02-27: 17:00:00 324 mg via ORAL
  Filled 2019-02-27: qty 4

## 2019-02-27 NOTE — Telephone Encounter (Signed)
Daughter is calling in just to let y'all know patient is in the hospital at [facility]. Her head was spinning and she was having to take big steps to walk. She just got her over there not long ago.Thanks!

## 2019-02-27 NOTE — Telephone Encounter (Signed)
Noted.  It appears that she had a stroke.  We should try to schedule her for in-office visit.

## 2019-02-27 NOTE — ED Notes (Signed)
Pt returned from MRI °

## 2019-02-27 NOTE — ED Provider Notes (Signed)
[facility] EMERGENCY DEPARTMENT Provider Note   CSN: [id number] Arrival date & time: 02/27/19  0859     History   Chief Complaint Chief Complaint  Patient presents with   Headache   Dizziness    HPI ZULEICA SEITH is a 73 y.o. female.     HPI Patient presents to the emergency department with dizziness that is started around 2 AM along with headache.  The patient states that she does not want to be here but states that she is never had symptoms like that in the past.  She states she had 2 strokes in the past.  Patient states that she did not take any medications prior to arrival for her symptoms.  The patient denies chest pain, shortness of breath, headache,blurred vision, neck pain, fever, cough, weakness, numbness, anorexia, edema, abdominal pain, nausea, vomiting, diarrhea, rash, back pain, dysuria, hematemesis, bloody stool, near syncope, or syncope. Past Medical History:  Diagnosis Date   Abdominal pain    Anal fissure    Arthritis    Asthma    Chronic back pain    Colon adenoma 2009, 2012   Colonoscopy   Diverticulosis of colon (without mention of hemorrhage) 2009   Colonoscopy   DM (diabetes mellitus) ([place])    off medicines for diabetes 12-18-13   GERD (gastroesophageal reflux disease)    Hemorrhoids    Hernia    HTN (hypertension)    Hyperlipidemia    low   Irritable bowel syndrome (IBS)    Low blood potassium    Obese    Stroke [facility])    x2   Ulcer     Patient Active Problem List   Diagnosis Date Noted   Chronic back pain 01/26/2019   Headache, acute 09/01/2018   Osteoarthritis of both knees 01/23/2018   Shoulder pain, bilateral 01/23/2018   Chest pain 11/08/2017   Abscess of female genitalia 05/03/2017   GAD (generalized anxiety disorder) 11/22/2016   Seasonal allergies 11/22/2016   Asthma, chronic 11/22/2016   History of stroke 09/13/2016   Diverticulosis of colon 09/13/2016   Osteoarthritis of  right hip 05/29/2016   Osteoporosis 94/85/4627   Systolic murmur 03/50/0938   Essential hypertension 04/02/2016   Chest pain with moderate risk for cardiac etiology 03/07/2016   History of esophageal stricture 03/01/2016   Rectal bleeding 02/02/2014   Esophageal reflux 02/02/2014   Personal history of colonic polyps 12/09/2013   Dysphagia, unspecified(787.20) 09/11/2012   Nausea alone 09/11/2012   Diabetes mellitus without mention of complication 18/29/9371    Past Surgical History:  Procedure Laterality Date   COLONOSCOPY     FLEXIBLE SIGMOIDOSCOPY N/A 07/14/2012   Procedure: FLEXIBLE SIGMOIDOSCOPY;  Surgeon: Inda Castle, MD;  Location: [facility];  Service: Endoscopy;  Laterality: N/A;   HEMORRHOID BANDING N/A 07/14/2012   Procedure: HEMORRHOID BANDING;  Surgeon: Inda Castle, MD;  Location: [facility];  Service: Endoscopy;  Laterality: N/A;   PARTIAL HYSTERECTOMY  1971   POLYPECTOMY     TONSILLECTOMY       OB History   No obstetric history on file.      Home Medications    Prior to Admission medications   Medication Sig Start Date End Date Taking? Authorizing Provider  albuterol (PROVENTIL HFA;VENTOLIN HFA) 108 (90 Base) MCG/ACT inhaler Inhale 2 puffs into the lungs every 6 (six) hours as needed for wheezing or shortness of breath. 06/16/18   Everrett Coombe, MD  aspirin [facility] ASPIRIN ADULT LOW  DOSE) 81 MG EC tablet Take 1 tablet (81 mg total) by mouth daily. 09/03/18   Rory Percy, DO  Blood Glucose Calibration (ONETOUCH VERIO) High SOLN 1 each by In Vitro route as directed. 12/26/16   Dickie La, MD  Blood Glucose Calibration (ONETOUCH VERIO) SOLN 1 Bottle by In Vitro route as directed. 12/26/16   Dickie La, MD  Blood Glucose Monitoring Suppl (ONETOUCH VERIO) w/Device KIT 1 kit by Does not apply route 3 (three) times daily. ICD-10 code: E11.9 12/26/16   Dickie La, MD  carvedilol (COREG) 3.125 MG tablet Take 1 tablet (3.125 mg total) by mouth 2  (two) times daily. 01/26/19   Patriciaann Clan, DO  docusate sodium (COLACE) 100 MG capsule Take 1 capsule (100 mg total) by mouth every 12 (twelve) hours. 11/04/18   Daisy Floro, DO  ezetimibe (ZETIA) 10 MG tablet Take 1 tablet (10 mg total) by mouth daily. 11/04/18   Daisy Floro, DO  Ferrous Sulfate (IRON) 325 (65 Fe) MG TABS Take 1 tablet (325 mg total) by mouth 2 (two) times daily with a meal. 01/26/19   Patriciaann Clan, DO  glimepiride (AMARYL) 2 MG tablet Take 1 tablet by mouth once daily with breakfast 02/02/19   Milus Banister C, DO  glucose blood (ONETOUCH VERIO) test strip Use as instructed to test glucose level three times daily. ICD-10 code: E11.9 06/16/18   Everrett Coombe, MD  Lancet Devices (ONE TOUCH DELICA LANCING DEV) MISC 1 Device by Does not apply route as directed. ICD-10 code: E11.9 12/26/16   Dickie La, MD  pantoprazole (PROTONIX) 40 MG tablet Take 1 tablet (40 mg total) by mouth daily. 11/04/18   Daisy Floro, DO  polyethylene glycol (MIRALAX / GLYCOLAX) 17 g packet Take 17 g by mouth daily. 11/04/18   Daisy Floro, DO  rosuvastatin (CRESTOR) 40 MG tablet Take 1 tablet (40 mg total) by mouth daily. 11/04/18   Daisy Floro, DO  traMADol (ULTRAM) 50 MG tablet TAKE 1 TABLET BY MOUTH EVERY 12 HOURS AS NEEDED FOR MODERATE PAIN 01/17/19   Matilde Haymaker, MD    Family History Family History  Problem Relation Age of Onset   Prostate cancer Brother    Heart disease Mother    Anuerysm Father    Lung cancer Brother    Colon cancer Brother    Rectal cancer Neg Hx    Stomach cancer Neg Hx    Esophageal cancer Neg Hx     Social History Social History   Tobacco Use   Smoking status: Former Smoker    Packs/day: 0.25    Years: 39.00    Pack years: 9.75    Types: Cigarettes    Start date: 04/16/1976    Quit date: 02/13/2016    Years since quitting: 3.0   Smokeless tobacco: Never Used   Tobacco comment: 4-5 cigs per day for 39 years   Substance Use Topics   Alcohol use: Yes    Alcohol/week: 21.0 standard drinks    Types: 21 Cans of beer per week    Comment: quit 2 years ago   Drug use: Yes    Types: Marijuana    Comment: using for depression// last used this morning 4am     Allergies   Codeine and Penicillins   Review of Systems Review of Systems All other systems negative except as documented in the HPI. All pertinent positives and negatives as reviewed in the HPI.  Physical Exam Updated Vital Signs BP (!) 167/112 (BP Location: Left Arm)    Pulse (!) 106    Resp (!) 25    Ht 5' 2" (1.575 m)    Wt 66 kg    SpO2 98%    BMI 26.61 kg/m   Physical Exam Vitals signs and nursing note reviewed.  Constitutional:      General: She is not in acute distress.    Appearance: She is well-developed.  HENT:     Head: Normocephalic and atraumatic.  Eyes:     Pupils: Pupils are equal, round, and reactive to light.  Neck:     Musculoskeletal: Normal range of motion and neck supple.  Cardiovascular:     Rate and Rhythm: Normal rate and regular rhythm.     Heart sounds: Normal heart sounds. No murmur. No friction rub. No gallop.   Pulmonary:     Effort: Pulmonary effort is normal. No respiratory distress.     Breath sounds: Normal breath sounds. No wheezing.  Abdominal:     General: Bowel sounds are normal. There is no distension.     Palpations: Abdomen is soft.     Tenderness: There is no abdominal tenderness.  Skin:    General: Skin is warm and dry.     Capillary Refill: Capillary refill takes less than 2 seconds.     Findings: No erythema or rash.  Neurological:     Mental Status: She is alert and oriented to person, place, and time.     GCS: GCS eye subscore is 4. GCS verbal subscore is 5. GCS motor subscore is 6.     Sensory: No sensory deficit.     Motor: No abnormal muscle tone.     Coordination: Coordination normal.     Gait: Gait abnormal.  Psychiatric:        Mood and Affect: Mood is anxious.         Behavior: Behavior normal.      ED Treatments / Results  Labs (all labs ordered are listed, but only abnormal results are displayed) Labs Reviewed  BASIC METABOLIC PANEL  CBC WITH DIFFERENTIAL/PLATELET    EKG EKG Interpretation  Date/Time:  Friday February 27 2019 09:03:38 EST Ventricular Rate:  106 PR Interval:    QRS Duration: 81 QT Interval:  339 QTC Calculation: 451 R Axis:   70 Text Interpretation: Sinus tachycardia Probable LVH with secondary repol abnrm Nonspecific ST abnormality No significant change since last tracing Confirmed by Lajean Saver [phone]) on 02/27/2019 10:32:19 AM   Radiology Mr Brain Wo Contrast (neuro Protocol)  Result Date: 02/27/2019 CLINICAL DATA:  Ataxia.  Suspected stroke. EXAM: MRI HEAD WITHOUT CONTRAST TECHNIQUE: Multiplanar, multiecho pulse sequences of the brain and surrounding structures were obtained without intravenous contrast. COMPARISON:  09/02/2018 FINDINGS: Brain: There is newly seen late subacute infarction at the medial left parietooccipital junction. This was not present on the study of May 2020, but does not appear be truly acute. There are chronic small-vessel ischemic changes throughout the pons. Few old small vessel cerebellar infarctions. Old lacunar infarctions of the thalami. Old basal ganglia infarctions left more extensive than right. Old left frontal cortical and subcortical infarctions, the lateral component of which was acute in May of this year. No evidence of mass lesion, recent hemorrhage, hydrocephalus or extra-axial collection. Some hemosiderin deposition associated with the lateral left frontal infarction. Vascular: Major vessels at the base of the brain show flow. Skull and upper cervical spine: Negative Sinuses/Orbits: Clear/normal  Other: None IMPRESSION: Probably late subacute infarction at the medial left parietooccipital junction, not present on the study of May but probably few weeks old at least. No true acute  insult identified. Extensive older ischemic changes throughout the brain as outlined above. Electronically Signed   By: Nelson Chimes M.D.   On: 02/27/2019 12:50    Procedures Procedures (including critical care time)  Medications Ordered in ED Medications  LORazepam (ATIVAN) tablet 1 mg (1 mg Oral Given 02/27/19 1001)  LORazepam (ATIVAN) tablet 1 mg (1 mg Oral Given 02/27/19 1532)     Initial Impression / Assessment and Plan / ED Course  I have reviewed the triage vital signs and the nursing notes.  Pertinent labs & imaging results that were available during my care of the patient were reviewed by me and considered in my medical decision making (see chart for details).      Spoke with neurology about her findings and they evaluated her.  They feel she needs to be admitted to the hospital for work-up of embolic stroke of unknown origin.  They feel like she needs TEE as well.  The feel she needs PT OT for her dizziness.  Final Clinical Impressions(s) / ED Diagnoses   Final diagnoses:  None    ED Discharge Orders    None       Dalia Heading, PA-C 02/27/19 1602    Lajean Saver, MD 02/28/19 1442

## 2019-02-27 NOTE — Telephone Encounter (Signed)
Called patient daughter to reminder her that when patient is D/C to call office back to make f/u in office. No answer left message for patient to do this.

## 2019-02-27 NOTE — ED Notes (Signed)
Pt ambulated to restroom with one person assist for safety. Pt denied dizziness.

## 2019-02-27 NOTE — ED Notes (Signed)
2 gram sodium dinner tray ordered

## 2019-02-27 NOTE — H&P (Signed)
History and Physical    Margaret Rangel:[id number] DOB: 1945/07/30 DOA: 02/27/2019  PCP: Daisy Floro, DO  Patient coming from: Home  I have personally briefly reviewed patient's old medical records in [place]  Chief Complaint: Headache and dizziness  HPI: Margaret Rangel is a 73 y.o. female with medical history significant of stroke x2, hypertension, hyperlipidemia, type 2 diabetes mellitus, asthma presented to emergency department with headache and dizziness which started this morning.  Denies blurry vision, chest pain, shortness of breath, loss of consciousness, seizures, urinary or bowel incontinence, leg swelling, N/V/D, decreased appetite, fever, chills, urinary or bowel changes.  Patient has had previous strokes with the last one  about 6 months ago. She has not had a complete stroke work-up at that time as patient  refused to stay in the hospital.  She has baseline slurred speech and walking difficulties from previous stroke however she lives alone and does not use cane or walker for ambulation.  She is independent on daily life activities.  She takes her medications on time.  She denies smoking, alcohol however uses marijuana regularly.  ED Course: Upon arrival: Patient was tachycardic, blood pressure elevated.  MRI brain was obtained which showed late subacute infarction at the medial left parieto-occipital junction, extensive older ischemic changes throughout the brain.  Neurology consulted by EDP who recommended stroke work-up.  CT angiogram of head and neck is ordered and is pending.    Review of Systems: As per HPI otherwise negative.    Past Medical History:  Diagnosis Date  . Abdominal pain   . Anal fissure   . Arthritis   . Asthma   . Chronic back pain   . Colon adenoma 2009, 2012   Colonoscopy  . Diverticulosis of colon (without mention of hemorrhage) 2009   Colonoscopy  . DM (diabetes mellitus) ([place])    off medicines for diabetes 12-18-13  .  GERD (gastroesophageal reflux disease)   . Hemorrhoids   . Hernia   . HTN (hypertension)   . Hyperlipidemia    low  . Irritable bowel syndrome (IBS)   . Low blood potassium   . Obese   . Stroke ([place])    x2  . Ulcer     Past Surgical History:  Procedure Laterality Date  . COLONOSCOPY    . FLEXIBLE SIGMOIDOSCOPY N/A 07/14/2012   Procedure: FLEXIBLE SIGMOIDOSCOPY;  Surgeon: Inda Castle, MD;  Location: [facility] ENDOSCOPY;  Service: Endoscopy;  Laterality: N/A;  . HEMORRHOID BANDING N/A 07/14/2012   Procedure: HEMORRHOID BANDING;  Surgeon: Inda Castle, MD;  Location: [facility] ENDOSCOPY;  Service: Endoscopy;  Laterality: N/A;  . PARTIAL HYSTERECTOMY  1971  . POLYPECTOMY    . TONSILLECTOMY       reports that she quit smoking about 3 years ago. Her smoking use included cigarettes. She started smoking about 42 years ago. She has a 9.75 pack-year smoking history. She has never used smokeless tobacco. She reports current alcohol use of about 21.0 standard drinks of alcohol per week. She reports current drug use. Drug: Marijuana.  Allergies  Allergen Reactions  . Codeine Itching  . Penicillins Itching    Has patient had a PCN reaction causing immediate rash, facial/tongue/throat swelling, SOB or lightheadedness with hypotension:No Has patient had a PCN reaction causing severe rash involving mucus membranes or skin necrosis:No Has patient had a PCN reaction that required hopititalization:No Has patient had a PCN reaction occurring within the last 10 years:No If all of  the above answers are "NO", then may proceed with Cephalosporin use.     Family History  Problem Relation Age of Onset  . Prostate cancer Brother   . Heart disease Mother   . Anuerysm Father   . Lung cancer Brother   . Colon cancer Brother   . Rectal cancer Neg Hx   . Stomach cancer Neg Hx   . Esophageal cancer Neg Hx     Prior to Admission medications   Medication Sig Start Date End Date Taking? Authorizing Provider   albuterol (PROVENTIL HFA;VENTOLIN HFA) 108 (90 Base) MCG/ACT inhaler Inhale 2 puffs into the lungs every 6 (six) hours as needed for wheezing or shortness of breath. 06/16/18   Everrett Coombe, MD  aspirin (EQ ASPIRIN ADULT LOW DOSE) 81 MG EC tablet Take 1 tablet (81 mg total) by mouth daily. 09/03/18   Rory Percy, DO  Blood Glucose Calibration (ONETOUCH VERIO) High SOLN 1 each by In Vitro route as directed. 12/26/16   Dickie La, MD  Blood Glucose Calibration (ONETOUCH VERIO) SOLN 1 Bottle by In Vitro route as directed. 12/26/16   Dickie La, MD  Blood Glucose Monitoring Suppl (ONETOUCH VERIO) w/Device KIT 1 kit by Does not apply route 3 (three) times daily. ICD-10 code: E11.9 12/26/16   Dickie La, MD  carvedilol (COREG) 3.125 MG tablet Take 1 tablet (3.125 mg total) by mouth 2 (two) times daily. 01/26/19   Patriciaann Clan, DO  docusate sodium (COLACE) 100 MG capsule Take 1 capsule (100 mg total) by mouth every 12 (twelve) hours. 11/04/18   Daisy Floro, DO  ezetimibe (ZETIA) 10 MG tablet Take 1 tablet (10 mg total) by mouth daily. 11/04/18   Daisy Floro, DO  Ferrous Sulfate (IRON) 325 (65 Fe) MG TABS Take 1 tablet (325 mg total) by mouth 2 (two) times daily with a meal. 01/26/19   Patriciaann Clan, DO  glimepiride (AMARYL) 2 MG tablet Take 1 tablet by mouth once daily with breakfast 02/02/19   Milus Banister C, DO  glucose blood (ONETOUCH VERIO) test strip Use as instructed to test glucose level three times daily. ICD-10 code: E11.9 06/16/18   Everrett Coombe, MD  Lancet Devices (ONE TOUCH DELICA LANCING DEV) MISC 1 Device by Does not apply route as directed. ICD-10 code: E11.9 12/26/16   Dickie La, MD  pantoprazole (PROTONIX) 40 MG tablet Take 1 tablet (40 mg total) by mouth daily. 11/04/18   Daisy Floro, DO  polyethylene glycol (MIRALAX / GLYCOLAX) 17 g packet Take 17 g by mouth daily. 11/04/18   Daisy Floro, DO  rosuvastatin (CRESTOR) 40 MG tablet Take 1 tablet (40 mg  total) by mouth daily. 11/04/18   Daisy Floro, DO  traMADol (ULTRAM) 50 MG tablet TAKE 1 TABLET BY MOUTH EVERY 12 HOURS AS NEEDED FOR MODERATE PAIN 01/17/19   Matilde Haymaker, MD    Physical Exam: Vitals:   02/27/19 0908 02/27/19 0909 02/27/19 0949 02/27/19 1600  BP: (!) 167/112   (!) 175/68  Pulse: (!) 106   84  Resp: (!) 26  (!) 25   SpO2: 98%   100%  Weight:  66 kg    Height:  '5\' 2"'  (1.575 m)      Constitutional: NAD, calm, comfortable Eyes: PERRL, lids and conjunctivae normal ENMT: Mucous membranes are moist. Posterior pharynx clear of any exudate or lesions. Neck: normal, supple, no masses, no thyromegaly Respiratory: clear to auscultation bilaterally, no wheezing,  no crackles. Normal respiratory effort. No accessory muscle use.  Cardiovascular: Regular rate and rhythm, no murmurs / rubs / gallops. No extremity edema. 2+ pedal pulses. No carotid bruits.  Abdomen: no tenderness, no masses palpated. No hepatosplenomegaly. Bowel sounds positive.  Musculoskeletal: no clubbing / cyanosis. No joint deformity upper and lower extremities. Good ROM, no contractures. Normal muscle tone.  Skin: no rashes, lesions, ulcers. No induration Neurologic: Has slurred speech which is baseline for her.. Sensation intact, DTR normal. Strength 5/5 in all 4.  Psychiatric: Normal judgment and insight. Alert and oriented x 3. Normal mood.    Labs on Admission: I have personally reviewed following labs and imaging studies  CBC: Recent Labs  Lab 02/27/19 1600  WBC 15.3*  NEUTROABS 11.4*  HGB 13.0  HCT 39.4  MCV 80.4  PLT 818   Basic Metabolic Panel: No results for input(s): NA, K, CL, CO2, GLUCOSE, BUN, CREATININE, CALCIUM, MG, PHOS in the last 168 hours. GFR: CrCl cannot be calculated (Patient's most recent lab result is older than the maximum 21 days allowed.). Liver Function Tests: No results for input(s): AST, ALT, ALKPHOS, BILITOT, PROT, ALBUMIN in the last 168 hours. No results for  input(s): LIPASE, AMYLASE in the last 168 hours. No results for input(s): AMMONIA in the last 168 hours. Coagulation Profile: No results for input(s): INR, PROTIME in the last 168 hours. Cardiac Enzymes: No results for input(s): CKTOTAL, CKMB, CKMBINDEX, TROPONINI in the last 168 hours. BNP (last 3 results) No results for input(s): PROBNP in the last 8760 hours. HbA1C: No results for input(s): HGBA1C in the last 72 hours. CBG: No results for input(s): GLUCAP in the last 168 hours. Lipid Profile: No results for input(s): CHOL, HDL, LDLCALC, TRIG, CHOLHDL, LDLDIRECT in the last 72 hours. Thyroid Function Tests: No results for input(s): TSH, T4TOTAL, FREET4, T3FREE, THYROIDAB in the last 72 hours. Anemia Panel: No results for input(s): VITAMINB12, FOLATE, FERRITIN, TIBC, IRON, RETICCTPCT in the last 72 hours. Urine analysis:    Component Value Date/Time   COLORURINE YELLOW 09/02/2018 1211   APPEARANCEUR HAZY (A) 09/02/2018 1211   LABSPEC 1.009 09/02/2018 1211   PHURINE 5.0 09/02/2018 1211   GLUCOSEU NEGATIVE 09/02/2018 1211   HGBUR NEGATIVE 09/02/2018 1211   BILIRUBINUR NEGATIVE 09/02/2018 1211   KETONESUR NEGATIVE 09/02/2018 1211   PROTEINUR 30 (A) 09/02/2018 1211   UROBILINOGEN 0.2 04/01/2012 1216   NITRITE NEGATIVE 09/02/2018 1211   LEUKOCYTESUR MODERATE (A) 09/02/2018 1211    Radiological Exams on Admission: Mr Brain Wo Contrast (neuro Protocol)  Result Date: 02/27/2019 CLINICAL DATA:  Ataxia.  Suspected stroke. EXAM: MRI HEAD WITHOUT CONTRAST TECHNIQUE: Multiplanar, multiecho pulse sequences of the brain and surrounding structures were obtained without intravenous contrast. COMPARISON:  09/02/2018 FINDINGS: Brain: There is newly seen late subacute infarction at the medial left parietooccipital junction. This was not present on the study of May 2020, but does not appear be truly acute. There are chronic small-vessel ischemic changes throughout the pons. Few old small vessel  cerebellar infarctions. Old lacunar infarctions of the thalami. Old basal ganglia infarctions left more extensive than right. Old left frontal cortical and subcortical infarctions, the lateral component of which was acute in May of this year. No evidence of mass lesion, recent hemorrhage, hydrocephalus or extra-axial collection. Some hemosiderin deposition associated with the lateral left frontal infarction. Vascular: Major vessels at the base of the brain show flow. Skull and upper cervical spine: Negative Sinuses/Orbits: Clear/normal Other: None IMPRESSION: Probably late subacute infarction at  the medial left parietooccipital junction, not present on the study of May but probably few weeks old at least. No true acute insult identified. Extensive older ischemic changes throughout the brain as outlined above. Electronically Signed   By: Nelson Chimes M.D.   On: 02/27/2019 12:50    EKG: None sinus tachycardia, LVH, no ST elevation or depression noted.  Assessment/Plan Principal Problem:   Ischemic stroke ([place]) Active Problems:   DM (diabetes mellitus) ([place])   Esophageal reflux   Essential hypertension   Hyperlipidemia   Ischemic stroke: -admit forTelemetry monitoring -MRI brain as above. -CTA of brain and neck is ordered and is pending. -Allow for permissive hypertension for the first 24-48h - only treat PRN if SBP >005 mmHg or diastolic blood pressure >110. Blood pressures can be gradually normalized to SBP<140 upon discharge. -ASA given -Ordered echocardiogram to- rule out PFO, Lipid Panel, and A1C -Frequent neuro checks -Atorvastatin PO within 24 hrs. -EDP consulted neurology-appreciate help. -We will keep her n.p.o. until she passes the bedside swallow evaluation. -PT/OT eval, Speech consult  Hypertension: Elevated -We will hold her home BP medicines for now to allow permissive hypertension -Monitor blood pressure closely.  Hyperlipidemia: Check lipid panel -Continue statin  Type  2 diabetes mellitus: Check A1c -Start on sliding scale insulin and monitor blood sugar closely.  GERD: Stable -Continue Protonix.  Marijuana abuse: Discussed about cessation. -Patient reports that marijuana helps her feel better.  Leukocytosis: Likely reactive -Patient is afebrile.  Monitor for now. -We will check her UA   DVT prophylaxis: TED/SCD/Lovenox  code Status: Full code-confirmed with the patient and her daughter Family Communication: Patient's daughter present at bedside.  Plan of care discussed with patient and her daughter in length and they verbalized understanding and agreed with it. Disposition Plan: TBD Consults called: Neurology by EDP Admission status: Inpatient   Mckinley Jewel MD [facility] Pager [phone]  If 7PM-7AM, please contact night-coverage [email or web address] Password [email or web address]  02/27/2019, 4:53 PM

## 2019-02-27 NOTE — Consult Note (Addendum)
NEURO HOSPITALIST  CONSULT   Requesting Physician: Dr. Ashok Cordia    Chief Complaint:  dizziness  History obtained from:  Patient   / daughter  HPI:                                                                                                                                         Margaret Rangel is an 73 y.o. female  With PMH stroke x2, HTN, HLD, DM2, asthma who presented to [facility] ED with c/o of dizziness and HA.  Per patient she received a shot in her back yesterday. At 0200 this morning she had a HA that felt like pressure across her forehead and dizziness that she described as the room spinning. It would not allow her to get out of bed. She also had difficulty walking. Per daughter patient would take big giant steps as if the floor was moving.  On ASA 44m, crestor 40 mg. She denies missing any medication doses. Denies ETOH, weakness, SOB cigarettes. Endorses smoking marijuana at least once per day and having had chest pain at the time of onset,but CP is now resolved. Of note: in the past patient has refused to stay in Hospital overnight, she is also refusing linq monitor which is why this has not been done.  ED course:  MRI: late subacute infarction at the medial left parietoccipital junction BP: 167/112  09/02/2018:multiple small acute strokes in the left frontal region as well as chronic small vessel ischemic changes and remote lacunar infarcts involving both basal ganglia, thalami and pons. Declined hospital admission at that time. Residual dysarthria noted.  Per JTomi Likensnote 02/06/2019 "Labs from 09/03/18 showed lipid panel with total cholesterol 276, TG 378, HDL 41 and LDL 159. Hgb A1c was 7.4%. She later had an outpatient carotid doppler on 09/17/18 which demonstrated 40-59% stenosis in the right internal carotid artery and 1-39% stenosis in the left internal carotid artery as well as over 50% stenosis in the right ECA, left CCA and left  ECA. 2D echocardiogram from 09/19/18 demonstrated LVEF 60-65% with no atrial level shunt detected by color flow Doppler. Cardiology is now discussing Linq monitoring."  Date last known well: 02/27/2019 Time last known well: 0200 tPA Given:no; outside of window Modified Rankin: Rankin Score=1 NIHSS:2; dysarthria     Past Medical History:  Diagnosis Date  . Abdominal pain   . Anal fissure   . Arthritis   . Asthma   . Chronic back pain   . Colon adenoma 2009, 2012   Colonoscopy  . Diverticulosis of colon (without mention of hemorrhage) 2009   Colonoscopy  . DM (diabetes mellitus) (  [place])    off medicines for diabetes 12-18-13  . GERD (gastroesophageal reflux disease)   . Hemorrhoids   . Hernia   . HTN (hypertension)   . Hyperlipidemia    low  . Irritable bowel syndrome (IBS)   . Low blood potassium   . Obese   . Stroke ([place])    x2  . Ulcer     Past Surgical History:  Procedure Laterality Date  . COLONOSCOPY    . FLEXIBLE SIGMOIDOSCOPY N/A 07/14/2012   Procedure: FLEXIBLE SIGMOIDOSCOPY;  Surgeon: Inda Castle, MD;  Location: [facility] ENDOSCOPY;  Service: Endoscopy;  Laterality: N/A;  . HEMORRHOID BANDING N/A 07/14/2012   Procedure: HEMORRHOID BANDING;  Surgeon: Inda Castle, MD;  Location: [facility] ENDOSCOPY;  Service: Endoscopy;  Laterality: N/A;  . PARTIAL HYSTERECTOMY  1971  . POLYPECTOMY    . TONSILLECTOMY      Family History  Problem Relation Age of Onset  . Prostate cancer Brother   . Heart disease Mother   . Anuerysm Father   . Lung cancer Brother   . Colon cancer Brother   . Rectal cancer Neg Hx   . Stomach cancer Neg Hx   . Esophageal cancer Neg Hx         social History:  reports that she quit smoking about 3 years ago. Her smoking use included cigarettes. She started smoking about 42 years ago. She has a 9.75 pack-year smoking history. She has never used smokeless tobacco. She reports current alcohol use of about 21.0 standard drinks of alcohol per week. She  reports current drug use. Drug: Marijuana.  Allergies:  Allergies  Allergen Reactions  . Codeine Itching  . Penicillins Itching    Has patient had a PCN reaction causing immediate rash, facial/tongue/throat swelling, SOB or lightheadedness with hypotension:No Has patient had a PCN reaction causing severe rash involving mucus membranes or skin necrosis:No Has patient had a PCN reaction that required hopititalization:No Has patient had a PCN reaction occurring within the last 10 years:No If all of the above answers are "NO", then may proceed with Cephalosporin use.     Medications:                                                                                                                           No current facility-administered medications for this encounter.    Current Outpatient Medications  Medication Sig Dispense Refill  . albuterol (PROVENTIL HFA;VENTOLIN HFA) 108 (90 Base) MCG/ACT inhaler Inhale 2 puffs into the lungs every 6 (six) hours as needed for wheezing or shortness of breath. 1 Inhaler 2  . aspirin (EQ ASPIRIN ADULT LOW DOSE) 81 MG EC tablet Take 1 tablet (81 mg total) by mouth daily. 90 tablet 3  . Blood Glucose Calibration (ONETOUCH VERIO) High SOLN 1 each by In Vitro route as directed. 1 each 6  . Blood Glucose Calibration (ONETOUCH VERIO) SOLN 1 Bottle by In Vitro route as directed. 1 each  6  . Blood Glucose Monitoring Suppl (ONETOUCH VERIO) w/Device KIT 1 kit by Does not apply route 3 (three) times daily. ICD-10 code: E11.9 1 kit 0  . carvedilol (COREG) 3.125 MG tablet Take 1 tablet (3.125 mg total) by mouth 2 (two) times daily. 180 tablet 0  . docusate sodium (COLACE) 100 MG capsule Take 1 capsule (100 mg total) by mouth every 12 (twelve) hours. 60 capsule 0  . ezetimibe (ZETIA) 10 MG tablet Take 1 tablet (10 mg total) by mouth daily. 90 tablet 3  . Ferrous Sulfate (IRON) 325 (65 Fe) MG TABS Take 1 tablet (325 mg total) by mouth 2 (two) times daily with a meal. 90  tablet 0  . glimepiride (AMARYL) 2 MG tablet Take 1 tablet by mouth once daily with breakfast 90 tablet 3  . glucose blood (ONETOUCH VERIO) test strip Use as instructed to test glucose level three times daily. ICD-10 code: E11.9 100 each 12  . Lancet Devices (ONE TOUCH DELICA LANCING DEV) MISC 1 Device by Does not apply route as directed. ICD-10 code: E11.9 1 each 0  . pantoprazole (PROTONIX) 40 MG tablet Take 1 tablet (40 mg total) by mouth daily. 90 tablet 3  . polyethylene glycol (MIRALAX / GLYCOLAX) 17 g packet Take 17 g by mouth daily. 14 each 0  . rosuvastatin (CRESTOR) 40 MG tablet Take 1 tablet (40 mg total) by mouth daily. 90 tablet 3  . traMADol (ULTRAM) 50 MG tablet TAKE 1 TABLET BY MOUTH EVERY 12 HOURS AS NEEDED FOR MODERATE PAIN 30 tablet 0     ROS:                                                                                                                                       ROS was performed and is negative except as noted in HPI    General Examination:                                                                                                      Blood pressure (!) 167/112, pulse (!) 106, resp. rate (!) 25, height _0  (1.575 m), weight 66 kg, SpO2 98 %.  Physical Exam  Constitutional: Appears well-developed and well-nourished.  Psych: Affect appropriate to situation Eyes: Normal external eye and conjunctiva. HENT: Normocephalic, no lesions, without obvious abnormality.   Musculoskeletal-no joint tenderness, deformity or swelling Cardiovascular: Normal rate and regular rhythm.  Respiratory: Effort normal, non-labored breathing saturations WNL GI: Soft.  No distension. There is no tenderness.  Skin: WDI  Neurological Examination Mental Status: Alert, oriented name/age/month/year/ city, thought content appropriate.  Speech fluent without evidence of aphasia.  Dysarthria ( not a new symptom has been present since stroke 6 months ago) Able to follow commands  without difficulty. Cranial Nerves: II: ; Visual fields grossly normal,  III,IV, VI: ptosis not present, extra-ocular motions intact bilaterally, pupils equal, round, reactive to light and accommodation V,VII: smile symmetric, facial light touch sensation normal bilaterally VIII: hearing normal bilaterally IX,X: uvula rises midline XI: bilateral shoulder shrug XII: midline tongue extension Motor: Right : Upper extremity   5/5  Left:     Upper extremity   5/5  Lower extremity   5/5   Lower extremity   5/5 Tone and bulk:normal tone throughout; no atrophy noted Sensory:  light touch intact throughout, bilaterally Deep Tendon Reflexes: 2+ and symmetric biceps and 1+ patella Plantars: Right: downgoing   Left: downgoing Cerebellar: No ataxia Gait: deferred   Lab Results: Imaging: Mr Brain Wo Contrast (neuro Protocol)  Result Date: 02/27/2019 CLINICAL DATA:  Ataxia.  Suspected stroke. EXAM: MRI HEAD WITHOUT CONTRAST TECHNIQUE: Multiplanar, multiecho pulse sequences of the brain and surrounding structures were obtained without intravenous contrast. COMPARISON:  09/02/2018 FINDINGS: Brain: There is newly seen late subacute infarction at the medial left parietooccipital junction. This was not present on the study of May 2020, but does not appear be truly acute. There are chronic small-vessel ischemic changes throughout the pons. Few old small vessel cerebellar infarctions. Old lacunar infarctions of the thalami. Old basal ganglia infarctions left more extensive than right. Old left frontal cortical and subcortical infarctions, the lateral component of which was acute in May of this year. No evidence of mass lesion, recent hemorrhage, hydrocephalus or extra-axial collection. Some hemosiderin deposition associated with the lateral left frontal infarction. Vascular: Major vessels at the base of the brain show flow. Skull and upper cervical spine: Negative Sinuses/Orbits: Clear/normal Other: None  IMPRESSION: Probably late subacute infarction at the medial left parietooccipital junction, not present on the study of May but probably few weeks old at least. No true acute insult identified. Extensive older ischemic changes throughout the brain as outlined above. Electronically Signed   By: Nelson Chimes M.D.   On: 02/27/2019 12:50    Laurey Morale, MSN, NP-C [facility] [phone]  02/27/2019, 2:10 PM   Attending physician note to follow with Assessment and plan .   Assessment: Margaret Rangel is an 73 y.o. female  With PMH stroke x2, HTN, HLD, DM2, asthma who presented to [facility] ED with c/o of dizziness (room spinning)  and HA. MRI revealed late subacute infarction at the medial left parietoccipital junction. Patient has had previous strokes with the last one being about 6 months ago. She has not had a complete stroke work-up d/t patient refusal to stay and refusal of test. Per daughter and patient she refused the linq monitor. Will obtain CTA of head and neck and labs today if patient agreeable.   Stroke Risk Factors - hyperlipidemia and hypertension    #Dizziness (improved): Possibly peripheral vertigo, less likely MRI negative stroke #Subacute infarction in left occipital lobe at least a few weeks duration, new since recent stroke in May 2020.   Recommendations: -Meclizine as needed -PT OT evaluation -CTA head and neck -Spoke with cardiology, discussed need for TEE and loop monitor for prolonged monitoring to detect paroxysmal A. fib given history of recurrent embolic strokes -No need to repeat  lipid profile and A1c -Continue aspirin for now, continue statin  Stroke team will follow   --please page stroke NP  Or  PA  Or MD from 8am -4 pm  as this patient from this time will be  followed by the stroke.   You can look them up on [email or web address]  Password [email or web address]   NEUROHOSPITALIST ADDENDUM Performed a face to face diagnostic evaluation.   I have reviewed the contents  of history and physical exam as documented by PA/ARNP/Resident and agree with above documentation.  I have discussed and formulated the above plan as documented. Edits to the note have been made as needed.  Patient presented with sudden onset dizziness that began this morning, characterized as sensation of room spinning and with difficulty in gait.  Symptoms have since improved after receiving meclizine in the ER.  Gait has improved but not quite close to baseline, which is also slightly impaired.  MRI brain was obtained which was negative for acute stroke. Overall impression favors peripheral vertigo however patient does have history of recurrent strokes and it is possible this could have been a small infarct in the posterior circulation missed MRI brain (most common location for MRI negative strokes).  What does concern me is MRI shows a new subacute left occipital lobe stroke, likely embolic given distribution.  Patient does have significant atherosclerotic disease and they may all be atheroembolic strokes however prudent to rule out cardioembolic source for stroke and given the fact patient has not stayed in the hospital for long, telemetry needs to be done to look for paroxysmal A. fib.  If telemetry negative, would recommend TEE and loop monitor given patient's recurrent strokes.  PT OT eval.  Discussed possibility of outpatient TEE and Linq device placement as patient did not want to stay in the hospital, however daughter very concerned and was worried patient would not follow-up discharge from ER.  Recommendations as made above.  Stroke team will follow the patient    Samara Snide MD [facility] [phone]   If 7pm to 7am, please call on call as listed on AMION.

## 2019-02-27 NOTE — ED Triage Notes (Signed)
Pt arrives by EMS with complaints of dizziness and headache. Pt reports she woke up at 0200 and had to urinate. Pt unable to walk to restroom due to being so dizzy. Pt extremely anxious. Pt pulled EMS IV out. Refusing IV during triage. Pt screaming she wants to go back home.  Pt reports headache BP 218/60 98.2 98% RA 124 HR 28 RR

## 2019-02-27 NOTE — Telephone Encounter (Signed)
Please be advise FYI below

## 2019-02-28 ENCOUNTER — Inpatient Hospital Stay (HOSPITAL_COMMUNITY): Payer: Medicare Other

## 2019-02-28 ENCOUNTER — Encounter (HOSPITAL_COMMUNITY): Payer: Self-pay | Admitting: *Deleted

## 2019-02-28 ENCOUNTER — Other Ambulatory Visit: Payer: Self-pay

## 2019-02-28 DIAGNOSIS — E785 Hyperlipidemia, unspecified: Secondary | ICD-10-CM

## 2019-02-28 DIAGNOSIS — I10 Essential (primary) hypertension: Secondary | ICD-10-CM

## 2019-02-28 DIAGNOSIS — I6389 Other cerebral infarction: Secondary | ICD-10-CM

## 2019-02-28 DIAGNOSIS — I6523 Occlusion and stenosis of bilateral carotid arteries: Secondary | ICD-10-CM

## 2019-02-28 DIAGNOSIS — Z8673 Personal history of transient ischemic attack (TIA), and cerebral infarction without residual deficits: Secondary | ICD-10-CM

## 2019-02-28 DIAGNOSIS — E1159 Type 2 diabetes mellitus with other circulatory complications: Secondary | ICD-10-CM

## 2019-02-28 LAB — GLUCOSE, CAPILLARY
Glucose-Capillary: 115 mg/dL — ABNORMAL HIGH (ref 70–99)
Glucose-Capillary: 131 mg/dL — ABNORMAL HIGH (ref 70–99)
Glucose-Capillary: 98 mg/dL (ref 70–99)
Glucose-Capillary: 164 mg/dL — ABNORMAL HIGH (ref 70–99)

## 2019-02-28 LAB — ECHOCARDIOGRAM COMPLETE
Height: 63 in
Weight: 2518.54 oz

## 2019-02-28 MED ORDER — PHENOL 1.4 % MT LIQD
1.0000 | OROMUCOSAL | Status: DC | PRN
  Administered 2019-02-28: 1 via OROMUCOSAL
  Filled 2019-02-28: qty 177

## 2019-02-28 MED ORDER — CALCIUM CARBONATE ANTACID 500 MG PO CHEW
1.0000 | CHEWABLE_TABLET | Freq: Three times a day (TID) | ORAL | Status: DC
  Administered 2019-02-28 – 2019-03-03 (×8): 200 mg via ORAL
  Filled 2019-02-28 (×8): qty 1

## 2019-02-28 MED ORDER — GUAIFENESIN-DM 100-10 MG/5ML PO SYRP
5.0000 mL | ORAL_SOLUTION | ORAL | Status: DC | PRN
  Administered 2019-02-28 – 2019-03-03 (×6): 5 mL via ORAL
  Filled 2019-02-28 (×6): qty 5

## 2019-02-28 NOTE — Progress Notes (Signed)
Margaret Rangel  PROGRESS NOTE    Margaret Rangel  [id number] DOB: January 17, 1946 DOA: 02/27/2019 PCP: Daisy Floro, DO   Brief Narrative:   Margaret Rangel is a 73 y.o. female with medical history significant of stroke x2, hypertension, hyperlipidemia, type 2 diabetes mellitus, asthma presented to emergency department with headache and dizziness which started this morning.  11/14: found to have subacute stroke. SLP cognitive eval recs 24-hr supervision, but patient lives alone.    Assessment & Plan:   Principal Problem:   Ischemic stroke ([place]) Active Problems:   DM (diabetes mellitus) ([place])   Esophageal reflux   Essential hypertension   Hyperlipidemia   Sub acute ischemic stroke (left parietooccipital)     - MRI w/ L PCA/MCA watershed injury     - multiple areas of vascular stneosis identified; neuro rec outpt vasc surg follow up     - continue zetia, crestor     - continue asa, plavix x 3 months; then ASA alone     - continue amaryl at discharge; pt is refusing lab draws, unable to check A1c; continuing SSI while in-house     - allowing permissive HTN for now; can resume BP meds over next several days     - PT/OT no further recs  Complex migraine?     - neuro recs follow up with Dr. Tomi Likens  Hypertension     - allowing permissive HTN for now; can resume BP meds over next several days   Hyperlipidemia     - continue crestor, zetia  Type 2 diabetes mellitus     - continue amaryl at discharge; pt is refusing lab draws, unable to check A1c; continuing SSI while in-house  GERD     - continue protonix  Marijuana abuse     - abstention encouraged  Leukocytosis     - Likely reactive     - afebrile and VSS     - not allowing lab draws  Cognitive decline     - SLP eval recs 24hr supervision; pt lives alone, need to work with CM/family on this; currently not safe discharge d/t this eval  DVT prophylaxis: lovenox Code Status: FULL Family Communication: with dtr by phone    Disposition Plan: TBD  Consultants:   Neurology  ROS:  Reports cough. Denies CP, N, V, dyspnea . Remainder 10-pt ROS is negative for all not previously mentioned.  Subjective: "I don't want nobody coming in my house."  Objective: Vitals:   02/27/19 2331 02/28/19 0411 02/28/19 0837 02/28/19 1221  BP: (!) 150/66 (!) 166/68 (!) 142/62 (!) 162/74  Pulse: 70 78 74 69  Resp: 19 18 20 20   Temp: 98.3 F (36.8 C) 98.3 F (36.8 C) 97.6 F (36.4 C) 98.7 F (37.1 C)  TempSrc: Oral Oral Oral Oral  SpO2: 99% 97% 100% 100%  Weight:      Height:        Intake/Output Summary (Last 24 hours) at 02/28/2019 1459 Last data filed at 02/28/2019 1223 Gross per 24 hour  Intake --  Output 650 ml  Net -650 ml   Filed Weights   02/27/19 0909 02/27/19 2017  Weight: 66 kg 71.4 kg    Examination:  General: 73 y.o. female resting in bed in NAD, somewhat paranoid Cardiovascular: RRR, +S1, S2, no m/g/r, equal pulses throughout Respiratory: CTABL, no w/r/r, normal WOB GI: BS+, NDNT, no masses noted, no organomegaly noted MSK: No e/c/c Neuro: A&O x 3, no focal deficits Psyc: cooperative,  but somewhat paranoid.   Data Reviewed: I have personally reviewed following labs and imaging studies.  CBC: Recent Labs  Lab 02/27/19 1600 02/27/19 1850  WBC 15.3* 15.0*  NEUTROABS 11.4*  --   HGB 13.0 12.6  HCT 39.4 38.0  MCV 80.4 80.2  PLT 304 [id number]   Basic Metabolic Panel: Recent Labs  Lab 02/27/19 1600 02/27/19 1850  NA 140  --   K 3.5  --   CL 103  --   CO2 23  --   GLUCOSE 143*  --   BUN 16  --   CREATININE 0.86 0.86  CALCIUM 9.5  --    GFR: Estimated Creatinine Clearance: 54.4 mL/min (by C-G formula based on SCr of 0.86 mg/dL). Liver Function Tests: No results for input(s): AST, ALT, ALKPHOS, BILITOT, PROT, ALBUMIN in the last 168 hours. No results for input(s): LIPASE, AMYLASE in the last 168 hours. No results for input(s): AMMONIA in the last 168 hours. Coagulation  Profile: No results for input(s): INR, PROTIME in the last 168 hours. Cardiac Enzymes: No results for input(s): CKTOTAL, CKMB, CKMBINDEX, TROPONINI in the last 168 hours. BNP (last 3 results) No results for input(s): PROBNP in the last 8760 hours. HbA1C: No results for input(s): HGBA1C in the last 72 hours. CBG: Recent Labs  Lab 02/27/19 2138 02/28/19 0616 02/28/19 1218  GLUCAP 160* 115* 131*   Lipid Profile: No results for input(s): CHOL, HDL, LDLCALC, TRIG, CHOLHDL, LDLDIRECT in the last 72 hours. Thyroid Function Tests: No results for input(s): TSH, T4TOTAL, FREET4, T3FREE, THYROIDAB in the last 72 hours. Anemia Panel: No results for input(s): VITAMINB12, FOLATE, FERRITIN, TIBC, IRON, RETICCTPCT in the last 72 hours. Sepsis Labs: No results for input(s): PROCALCITON, LATICACIDVEN in the last 168 hours.  No results found for this or any previous visit (from the past 240 hour(s)).    Radiology Studies: Ct Angio Head W Or Wo Contrast  Result Date: 02/27/2019 CLINICAL DATA:  Initial evaluation for TIA. EXAM: CT ANGIOGRAPHY HEAD AND NECK TECHNIQUE: Multidetector CT imaging of the head and neck was performed using the standard protocol during bolus administration of intravenous contrast. Multiplanar CT image reconstructions and MIPs were obtained to evaluate the vascular anatomy. Carotid stenosis measurements (when applicable) are obtained utilizing NASCET criteria, using the distal internal carotid diameter as the denominator. CONTRAST:  62mL OMNIPAQUE IOHEXOL 350 MG/ML SOLN COMPARISON:  Prior brain MRI from earlier same day. FINDINGS: CT HEAD FINDINGS Brain: Subacute ischemic insult involving the left parieto-occipital region again noted, better seen on prior MRI. No associated hemorrhage or mass effect. Additional chronic cortical infarcts involving the left ACA and MCA distribution noted as well. Chronic lacunar infarcts involving the bilateral basal ganglia, thalami, and pons noted  as well. Underlying atrophy with chronic microvascular ischemic changes noted. No acute intracranial hemorrhage. No other acute large vessel territory infarct. No mass lesion, midline shift or mass effect. No hydrocephalus. No extra-axial fluid collection. Vascular: No hyperdense vessel. Skull: Scalp soft tissues and calvarium within normal limits. Sinuses: Mild mucosal thickening noted within the ethmoidal air cells. Paranasal sinuses are otherwise clear. No mastoid effusion. Orbits: Globes and orbital soft tissues demonstrate no acute finding. Review of the MIP images confirms the above findings CTA NECK FINDINGS Aortic arch: Visualized aortic arch of normal caliber with normal 3 vessel morphology. Moderate atherosclerotic change about the arch and origin of the great vessels without hemodynamically significant stenosis. Visualized subclavian arteries widely patent. Right carotid system: Multifocal mixed eccentric plaque seen  throughout the right CCA with resultant mild multifocal stenosis. Mixed concentric plaque about the right bifurcation/proximal right ICA with associated stenosis of up to 60% by NASCET criteria. Right ICA tortuous but otherwise patent to the skull base without hemodynamically significant stenosis or other acute abnormality. Left carotid system: Extensive atheromatous change throughout the mid left CCA with associated mild to moderate multifocal stenosis measuring up to approximately 50%). 5 mm focal outpouching extending medially from the mid left CCA could reflect a focal penetrating plaque versus pseudoaneurysm (series 11, image 237). Multifocal mixed plaque about the left bifurcation/proximal left ICA with associated stenosis of up to 40% by NASCET criteria. Left ICA tortuous but otherwise patent to the skull base without hemodynamically significant stenosis or other acute vascular abnormality. Vertebral arteries: Both vertebral arteries arise from the subclavian arteries. Focal plaque at  the origin of the vertebral arteries bilaterally with up to moderate approximate 50% stenosis on the right, and more severe approximate 70% stenosis on the left. Vertebral arteries tortuous but otherwise patent within the neck without acute abnormality. Skeleton: No acute osseous finding. No discrete lytic or blastic osseous lesions. Moderate to advanced cervical spondylosis at C2-3 through C5-6. Other neck: No other acute soft tissue abnormality within the neck. No mass lesion or adenopathy. Upper chest: Visualized upper chest demonstrates no acute finding. Review of the MIP images confirms the above findings CTA HEAD FINDINGS Anterior circulation: Petrous segments widely patent bilaterally. Scattered calcified plaque throughout the cavernous/supraclinoid ICAs with resultant moderate to severe multifocal stenosis (estimated 50-70% bilaterally, worse on the right). ICA termini well perfused and patent. A1 segments patent bilaterally. Right A1 hypoplastic, accounting for the slightly diminutive right ICA is compared to the left. Normal anterior communicating artery. Multifocal atheromatous change throughout the ACAs bilaterally without high-grade stenosis. No M1 stenosis or occlusion. Normal MCA bifurcations. Extensive atheromatous change with multifocal moderate to severe stenoses throughout the distal MCA branches bilaterally, right worse than left. Note made of a focal severe proximal right M2 stenosis (series 14, image 24). Posterior circulation: Vertebral arteries patent to the vertebrobasilar junction without high-grade stenosis. Left vertebral artery slightly dominant. Patent right PICA. Left PICA not seen. Basilar diminutive but patent to its distal aspect without stenosis. Superior cerebral arteries grossly patent proximally. Right PCA supplied via the basilar. Fetal type origin left PCA. Multifocal atheromatous irregularity throughout both PCAs without high-grade stenosis. PCAs are patent to their distal  aspects. Venous sinuses: Grossly patent allowing for timing of the contrast bolus. Anatomic variants: Fetal type origin left PCA. No intracranial aneurysm. Review of the MIP images confirms the above findings IMPRESSION: CT HEAD IMPRESSION: 1. Subacute ischemic insult involving the left parieto-occipital junction, better characterized on recent MRI. 2. No other acute intracranial abnormality. 3. Extensive chronic ischemic change with multiple additional remote infarcts as above. CTA HEAD AND NECK IMPRESSION: 1. Negative CTA for large vessel occlusion. 2. Atheromatous change about the carotid bifurcations/proximal ICAs bilaterally, with associated stenosis of up to 60% on the right and 40% on the left. 3. 5 mm focal outpouching extending from the mid left CCA, which could reflect a penetrating atheromatous plaque versus focal pseudoaneurysm. 4. Atheromatous plaque at the origin of both vertebral arteries, with up to 50% narrowing on the right, and more severe at least 70% narrowing on the left. 5. Extensive atherosclerotic change throughout the intracranial circulation, most notable about the carotid siphons and distal MCA branches bilaterally. Electronically Signed   By: Jeannine Boga M.D.   On: 02/27/2019 18:33  Ct Angio Neck W Or Wo Contrast  Result Date: 02/27/2019 CLINICAL DATA:  Initial evaluation for TIA. EXAM: CT ANGIOGRAPHY HEAD AND NECK TECHNIQUE: Multidetector CT imaging of the head and neck was performed using the standard protocol during bolus administration of intravenous contrast. Multiplanar CT image reconstructions and MIPs were obtained to evaluate the vascular anatomy. Carotid stenosis measurements (when applicable) are obtained utilizing NASCET criteria, using the distal internal carotid diameter as the denominator. CONTRAST:  32mL OMNIPAQUE IOHEXOL 350 MG/ML SOLN COMPARISON:  Prior brain MRI from earlier same day. FINDINGS: CT HEAD FINDINGS Brain: Subacute ischemic insult involving  the left parieto-occipital region again noted, better seen on prior MRI. No associated hemorrhage or mass effect. Additional chronic cortical infarcts involving the left ACA and MCA distribution noted as well. Chronic lacunar infarcts involving the bilateral basal ganglia, thalami, and pons noted as well. Underlying atrophy with chronic microvascular ischemic changes noted. No acute intracranial hemorrhage. No other acute large vessel territory infarct. No mass lesion, midline shift or mass effect. No hydrocephalus. No extra-axial fluid collection. Vascular: No hyperdense vessel. Skull: Scalp soft tissues and calvarium within normal limits. Sinuses: Mild mucosal thickening noted within the ethmoidal air cells. Paranasal sinuses are otherwise clear. No mastoid effusion. Orbits: Globes and orbital soft tissues demonstrate no acute finding. Review of the MIP images confirms the above findings CTA NECK FINDINGS Aortic arch: Visualized aortic arch of normal caliber with normal 3 vessel morphology. Moderate atherosclerotic change about the arch and origin of the great vessels without hemodynamically significant stenosis. Visualized subclavian arteries widely patent. Right carotid system: Multifocal mixed eccentric plaque seen throughout the right CCA with resultant mild multifocal stenosis. Mixed concentric plaque about the right bifurcation/proximal right ICA with associated stenosis of up to 60% by NASCET criteria. Right ICA tortuous but otherwise patent to the skull base without hemodynamically significant stenosis or other acute abnormality. Left carotid system: Extensive atheromatous change throughout the mid left CCA with associated mild to moderate multifocal stenosis measuring up to approximately 50%). 5 mm focal outpouching extending medially from the mid left CCA could reflect a focal penetrating plaque versus pseudoaneurysm (series 11, image 237). Multifocal mixed plaque about the left bifurcation/proximal left  ICA with associated stenosis of up to 40% by NASCET criteria. Left ICA tortuous but otherwise patent to the skull base without hemodynamically significant stenosis or other acute vascular abnormality. Vertebral arteries: Both vertebral arteries arise from the subclavian arteries. Focal plaque at the origin of the vertebral arteries bilaterally with up to moderate approximate 50% stenosis on the right, and more severe approximate 70% stenosis on the left. Vertebral arteries tortuous but otherwise patent within the neck without acute abnormality. Skeleton: No acute osseous finding. No discrete lytic or blastic osseous lesions. Moderate to advanced cervical spondylosis at C2-3 through C5-6. Other neck: No other acute soft tissue abnormality within the neck. No mass lesion or adenopathy. Upper chest: Visualized upper chest demonstrates no acute finding. Review of the MIP images confirms the above findings CTA HEAD FINDINGS Anterior circulation: Petrous segments widely patent bilaterally. Scattered calcified plaque throughout the cavernous/supraclinoid ICAs with resultant moderate to severe multifocal stenosis (estimated 50-70% bilaterally, worse on the right). ICA termini well perfused and patent. A1 segments patent bilaterally. Right A1 hypoplastic, accounting for the slightly diminutive right ICA is compared to the left. Normal anterior communicating artery. Multifocal atheromatous change throughout the ACAs bilaterally without high-grade stenosis. No M1 stenosis or occlusion. Normal MCA bifurcations. Extensive atheromatous change with multifocal moderate to severe  stenoses throughout the distal MCA branches bilaterally, right worse than left. Note made of a focal severe proximal right M2 stenosis (series 14, image 24). Posterior circulation: Vertebral arteries patent to the vertebrobasilar junction without high-grade stenosis. Left vertebral artery slightly dominant. Patent right PICA. Left PICA not seen. Basilar  diminutive but patent to its distal aspect without stenosis. Superior cerebral arteries grossly patent proximally. Right PCA supplied via the basilar. Fetal type origin left PCA. Multifocal atheromatous irregularity throughout both PCAs without high-grade stenosis. PCAs are patent to their distal aspects. Venous sinuses: Grossly patent allowing for timing of the contrast bolus. Anatomic variants: Fetal type origin left PCA. No intracranial aneurysm. Review of the MIP images confirms the above findings IMPRESSION: CT HEAD IMPRESSION: 1. Subacute ischemic insult involving the left parieto-occipital junction, better characterized on recent MRI. 2. No other acute intracranial abnormality. 3. Extensive chronic ischemic change with multiple additional remote infarcts as above. CTA HEAD AND NECK IMPRESSION: 1. Negative CTA for large vessel occlusion. 2. Atheromatous change about the carotid bifurcations/proximal ICAs bilaterally, with associated stenosis of up to 60% on the right and 40% on the left. 3. 5 mm focal outpouching extending from the mid left CCA, which could reflect a penetrating atheromatous plaque versus focal pseudoaneurysm. 4. Atheromatous plaque at the origin of both vertebral arteries, with up to 50% narrowing on the right, and more severe at least 70% narrowing on the left. 5. Extensive atherosclerotic change throughout the intracranial circulation, most notable about the carotid siphons and distal MCA branches bilaterally. Electronically Signed   By: Jeannine Boga M.D.   On: 02/27/2019 18:33   Mr Brain Wo Contrast (neuro Protocol)  Result Date: 02/27/2019 CLINICAL DATA:  Ataxia.  Suspected stroke. EXAM: MRI HEAD WITHOUT CONTRAST TECHNIQUE: Multiplanar, multiecho pulse sequences of the brain and surrounding structures were obtained without intravenous contrast. COMPARISON:  09/02/2018 FINDINGS: Brain: There is newly seen late subacute infarction at the medial left parietooccipital junction.  This was not present on the study of May 2020, but does not appear be truly acute. There are chronic small-vessel ischemic changes throughout the pons. Few old small vessel cerebellar infarctions. Old lacunar infarctions of the thalami. Old basal ganglia infarctions left more extensive than right. Old left frontal cortical and subcortical infarctions, the lateral component of which was acute in May of this year. No evidence of mass lesion, recent hemorrhage, hydrocephalus or extra-axial collection. Some hemosiderin deposition associated with the lateral left frontal infarction. Vascular: Major vessels at the base of the brain show flow. Skull and upper cervical spine: Negative Sinuses/Orbits: Clear/normal Other: None IMPRESSION: Probably late subacute infarction at the medial left parietooccipital junction, not present on the study of May but probably few weeks old at least. No true acute insult identified. Extensive older ischemic changes throughout the brain as outlined above. Electronically Signed   By: Nelson Chimes M.D.   On: 02/27/2019 12:50     Scheduled Meds:  aspirin EC  325 mg Oral Daily   clopidogrel  75 mg Oral Daily   enoxaparin (LOVENOX) injection  40 mg Subcutaneous Q24H   ezetimibe  10 mg Oral Daily   insulin aspart  0-15 Units Subcutaneous TID WC   insulin aspart  0-5 Units Subcutaneous QHS   rosuvastatin  40 mg Oral Daily   Continuous Infusions:   LOS: 1 day    Time spent: 25 minutes spent in the coordination of care today.    Jonnie Finner, DO [facility] Pager [phone]  If 7PM-7AM, please contact night-coverage [email or web address] Password [email or web address] 02/28/2019, 2:59 PM

## 2019-02-28 NOTE — Progress Notes (Signed)
STROKE TEAM PROGRESS NOTE   INTERVAL HISTORY Her RN is at the bedside.  Patient is sitting in bed for lunch.  She denies any headache or dizziness at this time.  She stated that in the whole month of October, she had daily headache.  There were 2 days in October that she had a headache and also had dizziness with room spinning, lasting 10 minutes.  Yesterday she had again a headache and dizziness lasting 15 to 20 minutes followed by imbalance walking.  However, today she felt good, no headache, she walked with PT/OT with walker without difficulty.  She is eager to go home.    OBJECTIVE Vitals:   02/27/19 2249 02/27/19 2331 02/28/19 0411 02/28/19 0837  BP: (!) 164/48 (!) 150/66 (!) 166/68 (!) 142/62  Pulse:  70 78 74  Resp: 18 19 18 20   Temp: 98.4 F (36.9 C) 98.3 F (36.8 C) 98.3 F (36.8 C) 97.6 F (36.4 C)  TempSrc: Oral Oral Oral Oral  SpO2: 97% 99% 97% 100%  Weight:      Height:        CBC:  Recent Labs  Lab 02/27/19 1600 02/27/19 1850  WBC 15.3* 15.0*  NEUTROABS 11.4*  --   HGB 13.0 12.6  HCT 39.4 38.0  MCV 80.4 80.2  PLT 304 [id number]    Basic Metabolic Panel:  Recent Labs  Lab 02/27/19 1600 02/27/19 1850  NA 140  --   K 3.5  --   CL 103  --   CO2 23  --   GLUCOSE 143*  --   BUN 16  --   CREATININE 0.86 0.86  CALCIUM 9.5  --     Lipid Panel:     Component Value Date/Time   CHOL 276 (H) 09/03/2018 1549   TRIG 378 (H) 09/03/2018 1549   HDL 41 09/03/2018 1549   CHOLHDL 6.7 (H) 09/03/2018 1549   CHOLHDL 6.3 06/15/2010 0530   VLDL UNABLE TO CALCULATE IF TRIGLYCERIDE OVER 400 mg/dL 06/15/2010 0530   LDLCALC 159 (H) 09/03/2018 1549   HgbA1c:  Lab Results  Component Value Date   HGBA1C 7.4 (A) 09/03/2018   Urine Drug Screen:     Component Value Date/Time   LABOPIA NONE DETECTED 09/02/2018 1211   COCAINSCRNUR NONE DETECTED 09/02/2018 1211   COCAINSCRNUR NEG 04/11/2010 2024   LABBENZ NONE DETECTED 09/02/2018 1211   LABBENZ NEG 04/11/2010 2024    AMPHETMU NONE DETECTED 09/02/2018 1211   THCU POSITIVE (A) 09/02/2018 1211   LABBARB NONE DETECTED 09/02/2018 1211    Alcohol Level     Component Value Date/Time   ETH <10 09/02/2018 0923    IMAGING  Ct Angio Head W Or Wo Contrast Ct Angio Neck W Or Wo Contrast 02/27/2019 IMPRESSION:   CT HEAD  IMPRESSION:  1. Subacute ischemic insult involving the left parieto-occipital junction, better characterized on recent MRI.  2. No other acute intracranial abnormality.  3. Extensive chronic ischemic change with multiple additional remote infarcts as above.   CTA HEAD AND NECK  IMPRESSION:  1. Negative CTA for large vessel occlusion.  2. Atheromatous change about the carotid bifurcations/proximal ICAs bilaterally, with associated stenosis of up to 60% on the right and 40% on the left.  3. 5 mm focal outpouching extending from the mid left CCA, which could reflect a penetrating atheromatous plaque versus focal pseudoaneurysm.  4. Atheromatous plaque at the origin of both vertebral arteries, with up to 50% narrowing on the right, and more  severe at least 70% narrowing on the left.  5. Extensive atherosclerotic change throughout the intracranial circulation, most notable about the carotid siphons and distal MCA branches bilaterally.   Mr Brain Wo Contrast (neuro Protocol) 02/27/2019 IMPRESSION:  Probably late subacute infarction at the medial left parietooccipital junction, not present on the study of May but probably few weeks old at least. No true acute insult identified. Extensive older ischemic changes throughout the brain as outlined above.   Transthoracic Echocardiogram  02/28/2019 1. Left ventricular ejection fraction, by visual estimation, is 60 to 65%. The left ventricle has normal function. There is mildly increased left ventricular hypertrophy.  2. Elevated left ventricular end-diastolic pressure.  3. Left ventricular diastolic parameters are consistent with Grade I diastolic  dysfunction (impaired relaxation).  4. Global right ventricle has low normal systolic function.The right ventricular size is normal. No increase in right ventricular wall thickness.  5. Left atrial size was normal.  6. Right atrial size was normal.  7. Mild aortic valve annular calcification.  8. The mitral valve is grossly normal. No evidence of mitral valve regurgitation.  9. The tricuspid valve is grossly normal. Tricuspid valve regurgitation is not demonstrated. 10. Aortic valve regurgitation is mild to moderate. 11. The aortic valve is tricuspid. Aortic valve regurgitation is mild to moderate. Mild aortic valve sclerosis without stenosis. 12. There is Mild thickening of the aortic valve. 13. The pulmonic valve was grossly normal. Pulmonic valve regurgitation is not visualized. 14. The inferior vena cava is normal in size with greater than 50% respiratory variability, suggesting right atrial pressure of 3 mmHg.  ECG - ST rate 114 BPM. (See cardiology reading for complete details)    PHYSICAL EXAM  Temp:  [97.6 F (36.4 C)-98.7 F (37.1 C)] 98.7 F (37.1 C) (11/14 1221) Pulse Rate:  [69-84] 69 (11/14 1221) Resp:  [16-20] 20 (11/14 1221) BP: (142-183)/(48-74) 162/74 (11/14 1221) SpO2:  [96 %-100 %] 100 % (11/14 1221) Weight:  [71.4 kg] 71.4 kg (11/13 2017)  General - Well nourished, well developed, in no apparent distress.  Ophthalmologic - fundi not visualized due to noncooperation.  Cardiovascular - Regular rhythm and rate.  Mental Status -  Level of arousal and orientation to time, place, and person were intact. Language including expression, naming, repetition, comprehension was assessed and found intact.  Moderate dysarthria (since stroke in 08/2018 per pt)  Cranial Nerves II - XII - II - Visual field intact OU. III, IV, VI - Extraocular movements intact. V - Facial sensation intact bilaterally. VII - Facial movement intact bilaterally. VIII - Hearing & vestibular  intact bilaterally. X - Palate elevates symmetrically, poor denture. XI - Chin turning & shoulder shrug intact bilaterally. XII - Tongue protrusion intact.  Motor Strength - The patient's strength was normal in all extremities and pronator drift was absent.  Bulk was normal and fasciculations were absent.   Motor Tone - Muscle tone was assessed at the neck and appendages and was normal.  Reflexes - The patient's reflexes were symmetrical in all extremities and she had no pathological reflexes.  Sensory - Light touch, temperature/pinprick were assessed and were symmetrical.    Coordination - The patient had normal movements in the hands with no ataxia or dysmetria.  Tremor was absent.  Gait and Station - deferred.   ASSESSMENT/PLAN [name] is a 73 y.o. female with history of stroke x 2, HTN, HLD, DM2, asthma who presented to [facility] ED with c/o of dizziness and HA. Marland Kitchen  She did not receive IV t-PA due to late presentation (>4.5 hours from time of onset).  ? Complicated migraine, basilar type  Per pt, in the whole month of October, she had daily headache.    There were 2 days in October that she had a headache and also had dizziness with dizziness, lasting 10 minutes each.    Yesterday she had again headache and dizziness lasting 15 to 20 minutes followed by imbalance on walking  Pattern does not support TIA  Subacute stroke on MRI not able to explain the current symptoms.  Will follow up with Dr. Tomi Likens to consider chronic migraine treatment if needed.    Subacute stroke: Probably late subacute infarction at the left MCA/PCA watershed zone - likely large vessel disease source due to L CCA, ICA, siphon tandem stenosis  MRI head - Probably late subacute infarction at the medial left parietooccipital junction, not present on the study of May but probably few weeks old at least. No true acute insult identified.  CTA H&N - diffuse vasculopathy including left CCA 50% stenosis  with soft plaque, left ICA bulb 40% stenosis, right ICA bulb 60% stenosis, bilateral VA origin stenosis with right VA 50%, left VA 70%.  Bilateral ICA siphon atherosclerosis.  2D Echo EF 60 to 65%  LDL - pt refused blood draw  HgbA1c - pt refused blood draw  VTE prophylaxis - Lovenox / SCDs  aspirin 81 mg daily prior to admission, now on aspirin 325 mg daily and clopidogrel 75 mg daily.  Given diffuse vasculopathy, continue aspirin 325 and Plavix 75 DAPT for 3 months and then aspirin 325 alone.  Patient counseled to be compliant with her antithrombotic medications  Ongoing aggressive stroke risk factor management  Therapy recommendations:  pending  Disposition:  Pending  Carotid stenosis  CTA H&N - diffuse vasculopathy including left CCA 50% stenosis with soft plaque, left ICA bulb 40% stenosis, right ICA bulb 60% stenosis, bilateral ICA siphon atherosclerosis.  Recommend outpatient vascular surgery follow-up  History of stroke  08/2018 left sided headache and slurred speech, MRI showed acute left frontal MCA territory infarct, as well as chronic bilateral BG, thalami and pontine infarct.  Carotid Doppler right ICA 40 to 59% stenosis.  EF 60 to 65%.  Put on aspirin 81 and Crestor 40.    Follow with Dr. Tomi Likens at [facility]  Hypertension  Home BP meds: Coreg  Current BP meds: none   Stable . Permissive hypertension (OK if < 220/120) but gradually normalize in 5-7 days  . Long-term BP goal normotensive  Hyperlipidemia  Home Lipid lowering medication: Crestor 40 mg daily and Zetia 10 mg daily  LDL patient refused blood draw, goal < 70  Current lipid lowering medication: Crestor 40 mg daily and Zetia 10 mg daily  Continue statin at discharge  Diabetes  Home diabetic meds: Amaryl  HgbA1c pending, goal < 7.0  SSI  CBG monitoring  Close PCP follow-up  Other Stroke Risk Factors  Advanced age  Former cigarette smoker - quit 3 years ago  ETOH use, advised to drink  no more than 1 alcoholic beverage per day.  Other [facility] day # 1  Neurology will sign off. Please call with questions. Pt will follow up with Dr. Tomi Likens at [facility] in about 4 weeks. Thanks for the consult.  Rosalin Hawking, MD PhD Stroke Neurology 02/28/2019 2:35 PM   To contact Stroke Continuity provider, please refer to [email or web address] After hours, contact General Neurology

## 2019-02-28 NOTE — Progress Notes (Signed)
RN called to room by patient's daughter. Pt c/o dizziness, numbness to left hand, left face. [facility] MD notified. Stat CT ordered. Will continue to monitor.

## 2019-02-28 NOTE — Evaluation (Addendum)
Speech Language Pathology Evaluation Patient Details Name: Margaret Rangel MRN: [id number] DOB: Oct 14, 1945 Today's Date: 02/28/2019 Time: [id number] SLP Time Calculation (min) (ACUTE ONLY): 25 min  Problem List:  Patient Active Problem List   Diagnosis Date Noted  . Ischemic stroke ([place]) 02/27/2019  . Hyperlipidemia   . Chronic back pain 01/26/2019  . Headache, acute 09/01/2018  . Osteoarthritis of both knees 01/23/2018  . Shoulder pain, bilateral 01/23/2018  . Chest pain 11/08/2017  . Abscess of female genitalia 05/03/2017  . GAD (generalized anxiety disorder) 11/22/2016  . Seasonal allergies 11/22/2016  . Asthma, chronic 11/22/2016  . History of stroke 09/13/2016  . Diverticulosis of colon 09/13/2016  . Osteoarthritis of right hip 05/29/2016  . Osteoporosis 05/01/2016  . Systolic murmur [id number]  . Essential hypertension 04/02/2016  . Chest pain with moderate risk for cardiac etiology 03/07/2016  . History of esophageal stricture 03/01/2016  . Rectal bleeding 02/02/2014  . Esophageal reflux 02/02/2014  . Personal history of colonic polyps 12/09/2013  . Dysphagia, unspecified(787.20) 09/11/2012  . Nausea alone 09/11/2012  . DM (diabetes mellitus) ([place]) 12/22/2010   Past Medical History:  Past Medical History:  Diagnosis Date  . Abdominal pain   . Anal fissure   . Arthritis   . Asthma   . Chronic back pain   . Colon adenoma 2009, 2012   Colonoscopy  . Diverticulosis of colon (without mention of hemorrhage) 2009   Colonoscopy  . DM (diabetes mellitus) ([place])    off medicines for diabetes 12-18-13  . GERD (gastroesophageal reflux disease)   . Hemorrhoids   . Hernia   . HTN (hypertension)   . Hyperlipidemia    low  . Irritable bowel syndrome (IBS)   . Low blood potassium   . Obese   . Stroke ([place])    x2  . Ulcer    Past Surgical History:  Past Surgical History:  Procedure Laterality Date  . COLONOSCOPY    . FLEXIBLE SIGMOIDOSCOPY N/A 07/14/2012    Procedure: FLEXIBLE SIGMOIDOSCOPY;  Surgeon: Inda Castle, MD;  Location: [facility] ENDOSCOPY;  Service: Endoscopy;  Laterality: N/A;  . HEMORRHOID BANDING N/A 07/14/2012   Procedure: HEMORRHOID BANDING;  Surgeon: Inda Castle, MD;  Location: [facility] ENDOSCOPY;  Service: Endoscopy;  Laterality: N/A;  . PARTIAL HYSTERECTOMY  1971  . POLYPECTOMY    . TONSILLECTOMY     HPI:  73 y.o. female with medical history significant of stroke x2, hypertension, hyperlipidemia, type 2 diabetes mellitus, asthma presented to emergency department with headache and dizziness which started 02/27/19. Patient has had previous strokes with the last one about 6 months ago. She has not had a complete stroke work-up at that time as patient  refused to stay in the hospital.  She has baseline slurred speech and walking difficulties from previous stroke however she lives alone and does not use cane or walker for ambulation.  She is independent on daily life activities.  She takes her medications on time.  She denies smoking, alcohol however uses marijuana regularly; SLE generated; passed Yale swallow screen.  Assessment / Plan / Recommendation Clinical Impression  Pt administered only portions of the MOC[facility] Cognitive Assessment) d/t visual impairments/refusal; speech intelligibility affected overall performance as she was understood within complex conversation 50-75% of the time.  Her speech was also tangential/verbose, perseverative and somewhat impulsive during conversation; she was oriented x4, but frequently repeated herself within conversation suggesting STM deficits are likely present; concerns with safety awareness d/t pt refusing to  agree to have ST at d/c despite lengthy conversation re: need for assistance with speech clarity/communication; unable to establish baseline function this date, but suspect residual effects from prior CVAs.  Pt passed Yale swallow screen, but was coughing/expelling mucous frequently during SLE.   Denies dysphagia; no recent CXR in imaging section; Recommend ST f/u while in acute setting for dysarthria/cognitive impairment.  Thank you for this consult.    SLP Assessment  SLP Recommendation/Assessment: Patient needs continued Speech Language Pathology Services SLP Visit Diagnosis: Attention and concentration deficit;Dysarthria and anarthria (R47.1) Attention and concentration deficit following: Cerebral infarction    Follow Up Recommendations  24 hour supervision/assistance;Other (comment)(TBD)    Frequency and Duration min 2x/week  1 week      SLP Evaluation Cognition  Overall Cognitive Status: No family/caregiver present to determine baseline cognitive functioning Arousal/Alertness: Awake/alert Orientation Level: Oriented X4 Attention: Sustained Sustained Attention: Impaired Sustained Attention Impairment: Verbal basic;Functional basic Memory: Impaired Memory Impairment: Decreased short term memory Decreased Short Term Memory: Verbal basic;Functional basic Awareness: (DTA) Problem Solving: Impaired Problem Solving Impairment: Verbal basic;Functional basic Executive Function: Reasoning;Decision Making Reasoning: Impaired Reasoning Impairment: Verbal basic;Functional basic Decision Making: Impaired Decision Making Impairment: Verbal basic;Functional basic Behaviors: Impulsive;Verbal agitation;Perseveration Safety/Judgment: Other (comment)(DTA) Comments: (Difficult to assess not knowing baseline function)       Comprehension  Auditory Comprehension Overall Auditory Comprehension: Impaired Yes/No Questions: Within Functional Limits Commands: Impaired Multistep Basic Commands: 50-74% accurate Conversation: Complex Interfering Components: Working Marine scientist;Attention EffectiveTechniques: Repetition Visual Recognition/Discrimination Discrimination: Not tested Reading Comprehension Reading Status: Unable to assess (comment)(pt complaining of visual deficits "shadow" on  right eye)    Expression Expression Primary Mode of Expression: Verbal Verbal Expression Overall Verbal Expression: Impaired Level of Generative/Spontaneous Verbalization: Conversation Repetition: Impaired Level of Impairment: Sentence level Naming: No impairment Pragmatics: Impairment Impairments: Topic appropriateness;Topic maintenance Interfering Components: Attention;Speech intelligibility Non-Verbal Means of Communication: Not applicable Written Expression Dominant Hand: Right Written Expression: Not tested   Oral / Motor  Oral Motor/Sensory Function Overall Oral Motor/Sensory Function: Mild impairment Facial Symmetry: Abnormal symmetry left;Other (Comment)(slight) Lingual Strength: Reduced Motor Speech Overall Motor Speech: Other (comment)(appears grossly WFL; DTA) Respiration: Within functional limits Phonation: Normal Resonance: Within functional limits Articulation: Impaired Level of Impairment: Phrase Intelligibility: Intelligibility reduced Word: 50-74% accurate Phrase: 50-74% accurate Sentence: 50-74% accurate Conversation: 50-74% accurate Motor Planning: Not tested Motor Speech Errors: Not applicable Interfering Components: Premorbid status                       Elvina Sidle, M.S., CCC-SLP 02/28/2019, 12:18 PM

## 2019-02-28 NOTE — Evaluation (Signed)
Occupational Therapy Evaluation Patient Details Name: Margaret Rangel MRN: [id number] DOB: [birth year] Today's Date: 02/28/2019    History of Present Illness Margaret Rangel is a 73 y.o. female with medical history significant of stroke x2, hypertension, hyperlipidemia, type 2 diabetes mellitus, asthma presented to emergency department with headache and dizziness. MRI brain was obtained which showed late subacute infarction at the medial left parieto-occipital junction, extensive older ischemic changes throughout the brain.   Clinical Impression   Patient evaluated by Occupational Therapy with no further acute OT needs identified. All education has been completed and the patient has no further questions. Pt needs increased time to complete task but able to do so mod I. See below for any follow-up Occupational Therapy or equipment needs. OT to sign off. Thank you for referral.      Follow Up Recommendations  No OT follow up    Equipment Recommendations  None recommended by OT    Recommendations for Other Services       Precautions / Restrictions Precautions Precautions: Fall Restrictions Weight Bearing Restrictions: No      Mobility Bed Mobility Overal bed mobility: Independent                Transfers Overall transfer level: Modified independent               General transfer comment: A little slow to rise but without need for assist.    Balance Overall balance assessment: Needs assistance Sitting-balance support: No upper extremity supported;Feet supported Sitting balance-Leahy Scale: Normal     Standing balance support: No upper extremity supported Standing balance-Leahy Scale: Normal       Tandem Stance - Right Leg: 10(sec) Tandem Stance - Left Leg: 10(sec) Rhomberg - Eyes Opened: 10(sec) Rhomberg - Eyes Closed: 5(LOB) High level balance activites: Backward walking;Turns;Sudden stops;Head turns High Level Balance Comments: Performed no device,  without LOB Standardized Balance Assessment Standardized Balance Assessment : Dynamic Gait Index   Dynamic Gait Index Level Surface: Normal Change in Gait Speed: Mild Impairment Gait with Horizontal Head Turns: Normal Gait with Vertical Head Turns: Mild Impairment Gait and Pivot Turn: Normal Step Over Obstacle: Mild Impairment Step Around Obstacles: Normal Steps: Mild Impairment Total Score: 20     ADL either performed or assessed with clinical judgement   ADL Overall ADL's : Needs assistance/impaired Eating/Feeding: Independent   Grooming: Supervision/safety   Upper Body Bathing: Supervision/ safety   Lower Body Bathing: Control and instrumentation engineer Transfer: Copy Details (indicate cue type and reason): s         Functional mobility during ADLs: Supervision/safety General ADL Comments: pt concerned with tooth brush to brush teeth and inspects the briskles and able to use appropriate. pt has a center bottom tooth that is very loose. pt reports dentist hurt her mouth cleaning her teeth before. pt loves to talk about her family and very proud of them      Vision Baseline Vision/History: Wears glasses Wears Glasses: Reading only Additional Comments: pt reports that magnification glasses work better than her prescribtion glasses. pt able to scan environment, read back to thearpist and access phone without functional visual deficits     Perception     Praxis      Pertinent Vitals/Pain Pain Assessment: No/denies pain     Hand Dominance Right   Extremity/Trunk Assessment Upper Extremity Assessment Upper Extremity Assessment: Overall WFL for tasks assessed   Lower Extremity Assessment  Lower Extremity Assessment: Overall WFL for tasks assessed   Cervical / Trunk Assessment Cervical / Trunk Assessment: Normal   Communication Communication Communication: Expressive difficulties(slurred speech but very able to communicate  needs)   Cognition Arousal/Alertness: Awake/alert Behavior During Therapy: WFL for tasks assessed/performed Overall Cognitive Status: No family/caregiver present to determine baseline cognitive functioning Area of Impairment: Safety/judgement                         Safety/Judgement: Decreased awareness of safety     General Comments: appropriate for all task assessed   General Comments  pt states i never shower unless my daughter is there    Exercises Exercises: General Lower Extremity General Exercises - Lower Extremity Ankle Circles/Pumps: AROM;Both;20 reps   Shoulder Instructions      Home Living Family/patient expects to be discharged to:: Private residence Living Arrangements: Alone Available Help at Discharge: Family;Available PRN/intermittently Type of Home: Apartment Home Access: Level entry     Home Layout: One level     Bathroom Shower/Tub: Teacher, early years/pre: Standard     Home Equipment: Environmental consultant - 4 wheels;Walker - 2 wheels;Tub bench   Additional Comments: family checks on her. Very proud of her son that is a marine and now a cop      Prior Functioning/Environment Level of Independence: Needs assistance  Gait / Transfers Assistance Needed: Does not use device ADL's / Homemaking Assistance Needed: Daughter helps with some cleaning, otherwise performs on her own   Comments: States she only leaves apt for MD appointments.        OT Problem List:        OT Treatment/Interventions:      OT Goals(Current goals can be found in the care plan section) Acute Rehab OT Goals Patient Stated Goal: To go home as soon as possible  OT Frequency:     Barriers to D/C:            Co-evaluation              AM-PAC OT "6 Clicks" Daily Activity     Outcome Measure Help from another person eating meals?: None Help from another person taking care of personal grooming?: None Help from another person toileting, which includes  using toliet, bedpan, or urinal?: None Help from another person bathing (including washing, rinsing, drying)?: None Help from another person to put on and taking off regular upper body clothing?: None Help from another person to put on and taking off regular lower body clothing?: None 6 Click Score: 24   End of Session Equipment Utilized During Treatment: Gait belt Nurse Communication: Mobility status;Precautions  Activity Tolerance: Patient tolerated treatment well Patient left: in bed;with call bell/phone within reach;with bed alarm set  OT Visit Diagnosis: Unsteadiness on feet (R26.81)                Time: [id number] OT Time Calculation (min): 17 min Charges:  OT General Charges $OT Visit: 1 Visit OT Evaluation $OT Eval Moderate Complexity: 1 Mod   Brynn, OTR/L  Acute Rehabilitation Services Pager: [phone] Office: [phone] .   Jeri Modena 02/28/2019, 1:35 PM

## 2019-02-28 NOTE — Progress Notes (Signed)
Patient admitted from [facility] ed,  Awake alert and oriented x 4  Moves all extremities, pupils equal and reactive, pt denied any c/o of pain  Daughter was at bed side Plan of care and stroke education discussed with pt and daughter and both demonstrated good understanding. Pt stated did not want any needled stick again . RN rexpained to pt that she may need lab work in am.  phlebotomist came twice this  am to draw  Lab.  But patient refused.. Education and the need for lab work explained again by Therapist, sports to pt and Miss Knittel  still refused. Medical personnel on call made aware.Marland Kitchen

## 2019-02-28 NOTE — Evaluation (Signed)
Physical Therapy Evaluation Patient Details Name: Margaret Rangel MRN: [id number] DOB: 1945-08-22 Today's Date: 02/28/2019   History of Present Illness  Margaret Rangel is a 73 y.o. female with medical history significant of stroke x2, hypertension, hyperlipidemia, type 2 diabetes mellitus, asthma presented to emergency department with headache and dizziness. MRI brain was obtained which showed late subacute infarction at the medial left parieto-occipital junction, extensive older ischemic changes throughout the brain.  Clinical Impression  Pt admitted with above diagnosis. Ambulates without device, tolerating higher level dynamic challenges without loss of balance. Lives alone but has daughter visit daily who also performs her grocery shopping and light cleaning, pt otherwise independent in the house. Pt feels she is back to baseline functional ability this morning. Adequate for d/c from PT standpoint however will continue to monitor for any overt changes in functional status during admission. Pt currently with functional limitations due to the deficits listed below (see PT Problem List). Pt will benefit from skilled PT to increase their independence and safety with mobility to allow discharge to the venue listed below.       Follow Up Recommendations No PT follow up    Equipment Recommendations  None recommended by PT    Recommendations for Other Services       Precautions / Restrictions Precautions Precautions: Fall Restrictions Weight Bearing Restrictions: No      Mobility  Bed Mobility Overal bed mobility: Independent                Transfers Overall transfer level: Modified independent               General transfer comment: A little slow to rise but without need for assist.  Ambulation/Gait Ambulation/Gait assistance: Supervision Gait Distance (Feet): 200 Feet Assistive device: Rolling walker (2 wheeled);None Gait Pattern/deviations: Step-through  pattern;Decreased stride length Gait velocity: decreased   General Gait Details: Gait initially with RW for 100' supervision for safety. Tolerated another bout of 100' without device, tolerating higher level dynamic challenges. No overt instability noted, no physical assist required.  Stairs            Wheelchair Mobility    Modified Rankin (Stroke Patients Only) Modified Rankin (Stroke Patients Only) Pre-Morbid Rankin Score: Slight disability Modified Rankin: Slight disability     Balance Overall balance assessment: Needs assistance Sitting-balance support: No upper extremity supported;Feet supported Sitting balance-Leahy Scale: Normal     Standing balance support: No upper extremity supported Standing balance-Leahy Scale: Normal       Tandem Stance - Right Leg: 10(sec) Tandem Stance - Left Leg: 10(sec) Rhomberg - Eyes Opened: 10(sec) Rhomberg - Eyes Closed: 5(LOB) High level balance activites: Backward walking;Turns;Sudden stops;Head turns High Level Balance Comments: Performed no device, without LOB Standardized Balance Assessment Standardized Balance Assessment : Dynamic Gait Index   Dynamic Gait Index Level Surface: Normal Change in Gait Speed: Mild Impairment Gait with Horizontal Head Turns: Normal Gait with Vertical Head Turns: Mild Impairment Gait and Pivot Turn: Normal Step Over Obstacle: Mild Impairment Step Around Obstacles: Normal Steps: Mild Impairment Total Score: 20       Pertinent Vitals/Pain Pain Assessment: No/denies pain    Home Living Family/patient expects to be discharged to:: Private residence Living Arrangements: Alone Available Help at Discharge: Family;Available PRN/intermittently(Daughter visits 1x/day for an hour) Type of Home: Apartment Home Access: Level entry     Home Layout: One level Home Equipment: Walker - 4 wheels;Walker - 2 wheels;Tub bench      Prior  Function Level of Independence: Needs assistance   Gait /  Transfers Assistance Needed: Does not use device  ADL's / Homemaking Assistance Needed: Daughter helps with some cleaning, otherwise performs on her own  Comments: States she only leaves apt for MD appointments.     Hand Dominance   Dominant Hand: Right    Extremity/Trunk Assessment   Upper Extremity Assessment Upper Extremity Assessment: Defer to OT evaluation    Lower Extremity Assessment Lower Extremity Assessment: Overall WFL for tasks assessed       Communication   Communication: Expressive difficulties  Cognition Arousal/Alertness: Awake/alert Behavior During Therapy: WFL for tasks assessed/performed Overall Cognitive Status: No family/caregiver present to determine baseline cognitive functioning Area of Impairment: Safety/judgement                         Safety/Judgement: Decreased awareness of safety            General Comments      Exercises General Exercises - Lower Extremity Ankle Circles/Pumps: AROM;Both;20 reps   Assessment/Plan    PT Assessment Patient needs continued PT services  PT Problem List Decreased balance;Decreased mobility;Decreased coordination;Decreased cognition;Decreased safety awareness       PT Treatment Interventions DME instruction;Gait training;Functional mobility training;Therapeutic activities;Therapeutic exercise;Balance training;Neuromuscular re-education;Patient/family education    PT Goals (Current goals can be found in the Care Plan section)  Acute Rehab PT Goals Patient Stated Goal: To go home as soon as possible PT Goal Formulation: With patient Time For Goal Achievement: 03/14/19 Potential to Achieve Goals: Good    Frequency Min 4X/week   Barriers to discharge Decreased caregiver support lives alone, some family support avail    Co-evaluation               AM-PAC PT "6 Clicks" Mobility  Outcome Measure Help needed turning from your back to your side while in a flat bed without using  bedrails?: None Help needed moving from lying on your back to sitting on the side of a flat bed without using bedrails?: None Help needed moving to and from a bed to a chair (including a wheelchair)?: None Help needed standing up from a chair using your arms (e.g., wheelchair or bedside chair)?: None Help needed to walk in hospital room?: None Help needed climbing 3-5 steps with a railing? : None 6 Click Score: 24    End of Session Equipment Utilized During Treatment: Gait belt Activity Tolerance: Patient tolerated treatment well Patient left: in bed;with call bell/phone within reach;with bed alarm set;with SCD's reapplied Nurse Communication: Mobility status PT Visit Diagnosis: Unsteadiness on feet (R26.81);Other symptoms and signs involving the nervous system (R29.898)    Time: [id number] PT Time Calculation (min) (ACUTE ONLY): 25 min   Charges:   PT Evaluation $PT Eval Low Complexity: 1 Low PT Treatments $Gait Training: 8-22 mins        Elayne Snare, PT, DPT  Ellouise Newer 02/28/2019, 11:43 AM

## 2019-02-28 NOTE — Progress Notes (Signed)
  Echocardiogram 2D Echocardiogram has been performed.  Moneisha Vosler A Jeraldin Fesler 02/28/2019, 9:32 AM

## 2019-03-01 LAB — GLUCOSE, CAPILLARY
Glucose-Capillary: 116 mg/dL — ABNORMAL HIGH (ref 70–99)
Glucose-Capillary: 193 mg/dL — ABNORMAL HIGH (ref 70–99)
Glucose-Capillary: 97 mg/dL (ref 70–99)
Glucose-Capillary: 210 mg/dL — ABNORMAL HIGH (ref 70–99)

## 2019-03-01 MED ORDER — GLIMEPIRIDE 2 MG PO TABS
2.0000 mg | ORAL_TABLET | Freq: Every day | ORAL | Status: DC
  Filled 2019-03-01 (×2): qty 1

## 2019-03-01 NOTE — Progress Notes (Signed)
Physical Therapy Treatment Patient Details Name: Margaret Rangel MRN: [id number] DOB: 11/04/1945 Today's Date: 03/01/2019    History of Present Illness Margaret Rangel is a 73 y.o. female with medical history significant of stroke x2, hypertension, hyperlipidemia, type 2 diabetes mellitus, asthma presented to emergency department with headache and dizziness. MRI brain was obtained which showed late subacute infarction at the medial left parieto-occipital junction, extensive older ischemic changes throughout the brain.    PT Comments    Pt performing all mobility at modified independent level or better, essentially at baseline for mobility tasks, and no indication for PT follow up at d/c.  All education completed, pt d/c from PT with plan to return home at d/c from hospital.    Follow Up Recommendations  No PT follow up     Equipment Recommendations  None recommended by PT    Recommendations for Other Services       Precautions / Restrictions Precautions Precautions: Fall Precaution Comments: slightly impulsive    Mobility  Bed Mobility Overal bed mobility: Independent                Transfers Overall transfer level: Independent                  Ambulation/Gait Ambulation/Gait assistance: Supervision Gait Distance (Feet): 250 Feet Assistive device: None Gait Pattern/deviations: Step-through pattern;Decreased stride length     General Gait Details: ambulates without device with standby assist for safety, noting no instability, no LOB, no hesitation and pt denies worries/concerns   Stairs             Wheelchair Mobility    Modified Rankin (Stroke Patients Only) Modified Rankin (Stroke Patients Only) Pre-Morbid Rankin Score: Slight disability Modified Rankin: Slight disability     Balance Overall balance assessment: Modified Independent Sitting-balance support: No upper extremity supported;Feet supported Sitting balance-Leahy Scale:  Normal     Standing balance support: No upper extremity supported Standing balance-Leahy Scale: Normal Standing balance comment: see DGI                 Standardized Balance Assessment Standardized Balance Assessment : Dynamic Gait Index   Dynamic Gait Index Level Surface: Normal Change in Gait Speed: Normal Gait with Horizontal Head Turns: Normal Gait with Vertical Head Turns: Normal Gait and Pivot Turn: Normal Step Over Obstacle: Normal Step Around Obstacles: Normal Steps: Mild Impairment Total Score: 23      Cognition Arousal/Alertness: Awake/alert Behavior During Therapy: WFL for tasks assessed/performed Overall Cognitive Status: Within Functional Limits for tasks assessed                                 General Comments: pt extremely talkative entire session, appears impulsive but also has no significant limitations, therefore suspect is her baseline behavior      Exercises      General Comments General comments (skin integrity, edema, etc.): DGI indicates low/no fall risk during dynamic activity.        Pertinent Vitals/Pain Pain Assessment: No/denies pain    Home Living                      Prior Function            PT Goals (current goals can now be found in the care plan section) Acute Rehab PT Goals Patient Stated Goal: To go home as soon as possible PT Goal Formulation: All  assessment and education complete, DC therapy Progress towards PT goals: Goals met/education completed, patient discharged from PT    Frequency           PT Plan Current plan remains appropriate    Co-evaluation              AM-PAC PT "6 Clicks" Mobility   Outcome Measure  Help needed turning from your back to your side while in a flat bed without using bedrails?: None Help needed moving from lying on your back to sitting on the side of a flat bed without using bedrails?: None Help needed moving to and from a bed to a chair  (including a wheelchair)?: None Help needed standing up from a chair using your arms (e.g., wheelchair or bedside chair)?: None Help needed to walk in hospital room?: None Help needed climbing 3-5 steps with a railing? : None 6 Click Score: 24    End of Session Equipment Utilized During Treatment: Gait belt Activity Tolerance: Patient tolerated treatment well Patient left: in bed;with call bell/phone within reach;with bed alarm set Nurse Communication: Mobility status PT Visit Diagnosis: Other symptoms and signs involving the nervous system (R29.898)     Time: 1212-1228 PT Time Calculation (min) (ACUTE ONLY): 16 min  Charges:  $Therapeutic Activity: 8-22 mins                     Kearney Hard, PT, DPT, MS Board Certified Geriatric Clinical Specialist   Taran, Hable 03/01/2019, 1:25 PM

## 2019-03-01 NOTE — Progress Notes (Signed)
Margaret Rangel  PROGRESS NOTE    Margaret Rangel  [id number] DOB: [birth year] DOA: 02/27/2019 PCP: Daisy Floro, DO   Brief Narrative:   Margaret Rangel a 73 y.o.femalewith medical history significant ofstroke x2, hypertension, hyperlipidemia, type 2 diabetes mellitus, asthma presented to emergency department with headache and dizziness which started this morning.  11/14: found to have subacute stroke. SLP cognitive eval recs 24-hr supervision, but patient lives alone 11/15: Initially refused TEE; but tells nurse that she will do it if she is sedated   Assessment & Plan:   Principal Problem:   Ischemic stroke ([place]) Active Problems:   DM (diabetes mellitus) ([name])   Esophageal reflux   Essential hypertension   Hyperlipidemia  Sub acute ischemic stroke (left parietooccipital)     - MRI w/ L PCA/MCA watershed injury     - multiple areas of vascular stneosis identified; neuro rec outpt vasc surg follow up     - continue zetia, crestor     - continue asa, plavix x 3 months; then ASA alone     - continue amaryl at discharge; pt is refusing lab draws, unable to check A1c; continuing SSI while in-house     - allowing permissive HTN for now; can resume BP meds over next several days     - PT/OT no further recs     - awaiting TEE; initially refused, but now tells nursing that she will do it if she is sedated  Complex migraine?     - neuro recs follow up with Dr. Tomi Likens  Hypertension     - allowing permissive HTN for now; can resume BP meds over next several days   Hyperlipidemia     - continue crestor, zetia  Type 2 diabetes mellitus     - continue amaryl at discharge; pt is refusing lab draws, unable to check A1c; continuing SSI while in-house     - apparently has a fear of needles; change SSI to her home amaryl  GERD     - continue protonix  Marijuana abuse     - abstention encouraged  Leukocytosis     - Likely reactive     - afebrile and VSS     - not  allowing lab draws  Cognitive decline     - SLP eval recs 24hr supervision; pt lives alone, need to work with CM/family on this  Hopefully home tomorrow after TEE.  DVT prophylaxis: lovenox Code Status: FULL   Disposition Plan: TBD  Consultants:   Neurology  Subjective: "I don't wanna live in no one's house."  Objective: Vitals:   02/28/19 2347 03/01/19 0414 03/01/19 0758 03/01/19 1147  BP: (!) 166/64 (!) 170/62 (!) 156/64 (!) 163/61  Pulse: 77 64 61 66  Resp: 16 16    Temp: 97.9 F (36.6 C) 98 F (36.7 C) (!) 97.3 F (36.3 C) 98.6 F (37 C)  TempSrc: Oral Oral Oral Oral  SpO2: 99% 99% 100% 100%  Weight:      Height:        Intake/Output Summary (Last 24 hours) at 03/01/2019 1413 Last data filed at 03/01/2019 1326 Gross per 24 hour  Intake 240 ml  Output 2600 ml  Net -2360 ml   Filed Weights   02/27/19 0909 02/27/19 2017  Weight: 66 kg 71.4 kg    Examination:  General: 73 y.o. female resting in chair in NAD Cardiovascular: RRR, +S1, S2, no m/g/r Respiratory: CTABL, no w/r/r GI: BS+, NDNT, soft MSK:  No e/c/c Neuro: A&O x 3, no focal deficits   Data Reviewed: I have personally reviewed following labs and imaging studies.  CBC: Recent Labs  Lab 02/27/19 1600 02/27/19 1850  WBC 15.3* 15.0*  NEUTROABS 11.4*  --   HGB 13.0 12.6  HCT 39.4 38.0  MCV 80.4 80.2  PLT 304 [id number]   Basic Metabolic Panel: Recent Labs  Lab 02/27/19 1600 02/27/19 1850  NA 140  --   K 3.5  --   CL 103  --   CO2 23  --   GLUCOSE 143*  --   BUN 16  --   CREATININE 0.86 0.86  CALCIUM 9.5  --    GFR: Estimated Creatinine Clearance: 54.4 mL/min (by C-G formula based on SCr of 0.86 mg/dL). Liver Function Tests: No results for input(s): AST, ALT, ALKPHOS, BILITOT, PROT, ALBUMIN in the last 168 hours. No results for input(s): LIPASE, AMYLASE in the last 168 hours. No results for input(s): AMMONIA in the last 168 hours. Coagulation Profile: No results for input(s): INR,  PROTIME in the last 168 hours. Cardiac Enzymes: No results for input(s): CKTOTAL, CKMB, CKMBINDEX, TROPONINI in the last 168 hours. BNP (last 3 results) No results for input(s): PROBNP in the last 8760 hours. HbA1C: No results for input(s): HGBA1C in the last 72 hours. CBG: Recent Labs  Lab 02/28/19 1218 02/28/19 1559 02/28/19 2126 03/01/19 0620 03/01/19 1129  GLUCAP 131* 98 164* 116* 193*   Lipid Profile: No results for input(s): CHOL, HDL, LDLCALC, TRIG, CHOLHDL, LDLDIRECT in the last 72 hours. Thyroid Function Tests: No results for input(s): TSH, T4TOTAL, FREET4, T3FREE, THYROIDAB in the last 72 hours. Anemia Panel: No results for input(s): VITAMINB12, FOLATE, FERRITIN, TIBC, IRON, RETICCTPCT in the last 72 hours. Sepsis Labs: No results for input(s): PROCALCITON, LATICACIDVEN in the last 168 hours.  No results found for this or any previous visit (from the past 240 hour(s)).    Radiology Studies: Ct Angio Head W Or Wo Contrast  Result Date: 02/27/2019 CLINICAL DATA:  Initial evaluation for TIA. EXAM: CT ANGIOGRAPHY HEAD AND NECK TECHNIQUE: Multidetector CT imaging of the head and neck was performed using the standard protocol during bolus administration of intravenous contrast. Multiplanar CT image reconstructions and MIPs were obtained to evaluate the vascular anatomy. Carotid stenosis measurements (when applicable) are obtained utilizing NASCET criteria, using the distal internal carotid diameter as the denominator. CONTRAST:  22mL OMNIPAQUE IOHEXOL 350 MG/ML SOLN COMPARISON:  Prior brain MRI from earlier same day. FINDINGS: CT HEAD FINDINGS Brain: Subacute ischemic insult involving the left parieto-occipital region again noted, better seen on prior MRI. No associated hemorrhage or mass effect. Additional chronic cortical infarcts involving the left ACA and MCA distribution noted as well. Chronic lacunar infarcts involving the bilateral basal ganglia, thalami, and pons noted as  well. Underlying atrophy with chronic microvascular ischemic changes noted. No acute intracranial hemorrhage. No other acute large vessel territory infarct. No mass lesion, midline shift or mass effect. No hydrocephalus. No extra-axial fluid collection. Vascular: No hyperdense vessel. Skull: Scalp soft tissues and calvarium within normal limits. Sinuses: Mild mucosal thickening noted within the ethmoidal air cells. Paranasal sinuses are otherwise clear. No mastoid effusion. Orbits: Globes and orbital soft tissues demonstrate no acute finding. Review of the MIP images confirms the above findings CTA NECK FINDINGS Aortic arch: Visualized aortic arch of normal caliber with normal 3 vessel morphology. Moderate atherosclerotic change about the arch and origin of the great vessels without hemodynamically significant stenosis. Visualized  subclavian arteries widely patent. Right carotid system: Multifocal mixed eccentric plaque seen throughout the right CCA with resultant mild multifocal stenosis. Mixed concentric plaque about the right bifurcation/proximal right ICA with associated stenosis of up to 60% by NASCET criteria. Right ICA tortuous but otherwise patent to the skull base without hemodynamically significant stenosis or other acute abnormality. Left carotid system: Extensive atheromatous change throughout the mid left CCA with associated mild to moderate multifocal stenosis measuring up to approximately 50%). 5 mm focal outpouching extending medially from the mid left CCA could reflect a focal penetrating plaque versus pseudoaneurysm (series 11, image 237). Multifocal mixed plaque about the left bifurcation/proximal left ICA with associated stenosis of up to 40% by NASCET criteria. Left ICA tortuous but otherwise patent to the skull base without hemodynamically significant stenosis or other acute vascular abnormality. Vertebral arteries: Both vertebral arteries arise from the subclavian arteries. Focal plaque at the  origin of the vertebral arteries bilaterally with up to moderate approximate 50% stenosis on the right, and more severe approximate 70% stenosis on the left. Vertebral arteries tortuous but otherwise patent within the neck without acute abnormality. Skeleton: No acute osseous finding. No discrete lytic or blastic osseous lesions. Moderate to advanced cervical spondylosis at C2-3 through C5-6. Other neck: No other acute soft tissue abnormality within the neck. No mass lesion or adenopathy. Upper chest: Visualized upper chest demonstrates no acute finding. Review of the MIP images confirms the above findings CTA HEAD FINDINGS Anterior circulation: Petrous segments widely patent bilaterally. Scattered calcified plaque throughout the cavernous/supraclinoid ICAs with resultant moderate to severe multifocal stenosis (estimated 50-70% bilaterally, worse on the right). ICA termini well perfused and patent. A1 segments patent bilaterally. Right A1 hypoplastic, accounting for the slightly diminutive right ICA is compared to the left. Normal anterior communicating artery. Multifocal atheromatous change throughout the ACAs bilaterally without high-grade stenosis. No M1 stenosis or occlusion. Normal MCA bifurcations. Extensive atheromatous change with multifocal moderate to severe stenoses throughout the distal MCA branches bilaterally, right worse than left. Note made of a focal severe proximal right M2 stenosis (series 14, image 24). Posterior circulation: Vertebral arteries patent to the vertebrobasilar junction without high-grade stenosis. Left vertebral artery slightly dominant. Patent right PICA. Left PICA not seen. Basilar diminutive but patent to its distal aspect without stenosis. Superior cerebral arteries grossly patent proximally. Right PCA supplied via the basilar. Fetal type origin left PCA. Multifocal atheromatous irregularity throughout both PCAs without high-grade stenosis. PCAs are patent to their distal  aspects. Venous sinuses: Grossly patent allowing for timing of the contrast bolus. Anatomic variants: Fetal type origin left PCA. No intracranial aneurysm. Review of the MIP images confirms the above findings IMPRESSION: CT HEAD IMPRESSION: 1. Subacute ischemic insult involving the left parieto-occipital junction, better characterized on recent MRI. 2. No other acute intracranial abnormality. 3. Extensive chronic ischemic change with multiple additional remote infarcts as above. CTA HEAD AND NECK IMPRESSION: 1. Negative CTA for large vessel occlusion. 2. Atheromatous change about the carotid bifurcations/proximal ICAs bilaterally, with associated stenosis of up to 60% on the right and 40% on the left. 3. 5 mm focal outpouching extending from the mid left CCA, which could reflect a penetrating atheromatous plaque versus focal pseudoaneurysm. 4. Atheromatous plaque at the origin of both vertebral arteries, with up to 50% narrowing on the right, and more severe at least 70% narrowing on the left. 5. Extensive atherosclerotic change throughout the intracranial circulation, most notable about the carotid siphons and distal MCA branches bilaterally. Electronically Signed  By: Jeannine Boga M.D.   On: 02/27/2019 18:33   Ct Head Wo Contrast  Result Date: 02/28/2019 CLINICAL DATA:  History of stroke, headache, dizziness EXAM: CT HEAD WITHOUT CONTRAST TECHNIQUE: Contiguous axial images were obtained from the base of the skull through the vertex without intravenous contrast. COMPARISON:  CT brain, 02/27/2019, MR brain, 02/27/2019 FINDINGS: Brain: No significant interval change in examination with encephalomalacia of the anterior left frontal lobe (series 3, image 22), medial left frontal pole (series 3, image 14) and paramedian left parietooccipital region (series 3, image 27), with subacute infarction in the latter region characterized by prior MRI. Lacunar infarctions of the bilateral basal ganglia. Extensive  underlying periventricular and deep white matter hypodensity. Vascular: No hyperdense vessel or unexpected calcification. Skull: Normal. Negative for fracture or focal lesion. Sinuses/Orbits: No acute finding. Other: None. IMPRESSION: 1. No significant interval change in examination with encephalomalacia of the anterior left frontal lobe (series 3, image 22), medial left frontal pole (series 3, image 14) and paramedian left parietooccipital region (series 3, image 27), with subacute infarction in the latter region characterized by prior MRI. Lacunar infarctions of the bilateral basal ganglia. 2.  No new evidence of stroke or hemorrhage. 3.  Underlying small-vessel white matter disease. Electronically Signed   By: Eddie Candle M.D.   On: 02/28/2019 17:28   Ct Angio Neck W Or Wo Contrast  Result Date: 02/27/2019 CLINICAL DATA:  Initial evaluation for TIA. EXAM: CT ANGIOGRAPHY HEAD AND NECK TECHNIQUE: Multidetector CT imaging of the head and neck was performed using the standard protocol during bolus administration of intravenous contrast. Multiplanar CT image reconstructions and MIPs were obtained to evaluate the vascular anatomy. Carotid stenosis measurements (when applicable) are obtained utilizing NASCET criteria, using the distal internal carotid diameter as the denominator. CONTRAST:  42mL OMNIPAQUE IOHEXOL 350 MG/ML SOLN COMPARISON:  Prior brain MRI from earlier same day. FINDINGS: CT HEAD FINDINGS Brain: Subacute ischemic insult involving the left parieto-occipital region again noted, better seen on prior MRI. No associated hemorrhage or mass effect. Additional chronic cortical infarcts involving the left ACA and MCA distribution noted as well. Chronic lacunar infarcts involving the bilateral basal ganglia, thalami, and pons noted as well. Underlying atrophy with chronic microvascular ischemic changes noted. No acute intracranial hemorrhage. No other acute large vessel territory infarct. No mass lesion,  midline shift or mass effect. No hydrocephalus. No extra-axial fluid collection. Vascular: No hyperdense vessel. Skull: Scalp soft tissues and calvarium within normal limits. Sinuses: Mild mucosal thickening noted within the ethmoidal air cells. Paranasal sinuses are otherwise clear. No mastoid effusion. Orbits: Globes and orbital soft tissues demonstrate no acute finding. Review of the MIP images confirms the above findings CTA NECK FINDINGS Aortic arch: Visualized aortic arch of normal caliber with normal 3 vessel morphology. Moderate atherosclerotic change about the arch and origin of the great vessels without hemodynamically significant stenosis. Visualized subclavian arteries widely patent. Right carotid system: Multifocal mixed eccentric plaque seen throughout the right CCA with resultant mild multifocal stenosis. Mixed concentric plaque about the right bifurcation/proximal right ICA with associated stenosis of up to 60% by NASCET criteria. Right ICA tortuous but otherwise patent to the skull base without hemodynamically significant stenosis or other acute abnormality. Left carotid system: Extensive atheromatous change throughout the mid left CCA with associated mild to moderate multifocal stenosis measuring up to approximately 50%). 5 mm focal outpouching extending medially from the mid left CCA could reflect a focal penetrating plaque versus pseudoaneurysm (series 11, image 237).  Multifocal mixed plaque about the left bifurcation/proximal left ICA with associated stenosis of up to 40% by NASCET criteria. Left ICA tortuous but otherwise patent to the skull base without hemodynamically significant stenosis or other acute vascular abnormality. Vertebral arteries: Both vertebral arteries arise from the subclavian arteries. Focal plaque at the origin of the vertebral arteries bilaterally with up to moderate approximate 50% stenosis on the right, and more severe approximate 70% stenosis on the left. Vertebral  arteries tortuous but otherwise patent within the neck without acute abnormality. Skeleton: No acute osseous finding. No discrete lytic or blastic osseous lesions. Moderate to advanced cervical spondylosis at C2-3 through C5-6. Other neck: No other acute soft tissue abnormality within the neck. No mass lesion or adenopathy. Upper chest: Visualized upper chest demonstrates no acute finding. Review of the MIP images confirms the above findings CTA HEAD FINDINGS Anterior circulation: Petrous segments widely patent bilaterally. Scattered calcified plaque throughout the cavernous/supraclinoid ICAs with resultant moderate to severe multifocal stenosis (estimated 50-70% bilaterally, worse on the right). ICA termini well perfused and patent. A1 segments patent bilaterally. Right A1 hypoplastic, accounting for the slightly diminutive right ICA is compared to the left. Normal anterior communicating artery. Multifocal atheromatous change throughout the ACAs bilaterally without high-grade stenosis. No M1 stenosis or occlusion. Normal MCA bifurcations. Extensive atheromatous change with multifocal moderate to severe stenoses throughout the distal MCA branches bilaterally, right worse than left. Note made of a focal severe proximal right M2 stenosis (series 14, image 24). Posterior circulation: Vertebral arteries patent to the vertebrobasilar junction without high-grade stenosis. Left vertebral artery slightly dominant. Patent right PICA. Left PICA not seen. Basilar diminutive but patent to its distal aspect without stenosis. Superior cerebral arteries grossly patent proximally. Right PCA supplied via the basilar. Fetal type origin left PCA. Multifocal atheromatous irregularity throughout both PCAs without high-grade stenosis. PCAs are patent to their distal aspects. Venous sinuses: Grossly patent allowing for timing of the contrast bolus. Anatomic variants: Fetal type origin left PCA. No intracranial aneurysm. Review of the MIP  images confirms the above findings IMPRESSION: CT HEAD IMPRESSION: 1. Subacute ischemic insult involving the left parieto-occipital junction, better characterized on recent MRI. 2. No other acute intracranial abnormality. 3. Extensive chronic ischemic change with multiple additional remote infarcts as above. CTA HEAD AND NECK IMPRESSION: 1. Negative CTA for large vessel occlusion. 2. Atheromatous change about the carotid bifurcations/proximal ICAs bilaterally, with associated stenosis of up to 60% on the right and 40% on the left. 3. 5 mm focal outpouching extending from the mid left CCA, which could reflect a penetrating atheromatous plaque versus focal pseudoaneurysm. 4. Atheromatous plaque at the origin of both vertebral arteries, with up to 50% narrowing on the right, and more severe at least 70% narrowing on the left. 5. Extensive atherosclerotic change throughout the intracranial circulation, most notable about the carotid siphons and distal MCA branches bilaterally. Electronically Signed   By: Jeannine Boga M.D.   On: 02/27/2019 18:33     Scheduled Meds:  aspirin EC  325 mg Oral Daily   calcium carbonate  1 tablet Oral TID   clopidogrel  75 mg Oral Daily   enoxaparin (LOVENOX) injection  40 mg Subcutaneous Q24H   ezetimibe  10 mg Oral Daily   [START ON 03/02/2019] glimepiride  2 mg Oral Q breakfast   rosuvastatin  40 mg Oral Daily   Continuous Infusions:   LOS: 2 days    Time spent: 25 minutes spent in the coordination of care today.  Jonnie Finner, DO [facility] Pager [phone]  If 7PM-7AM, please contact night-coverage [email or web address] Password [email or web address] 03/01/2019, 2:13 PM

## 2019-03-01 NOTE — Progress Notes (Addendum)
    [facility] has been requested to perform a transesophageal echocardiogram on Margaret Rangel for CVA.  When I started to explain she said she did not want anything down her throat.  She had endo last year and it was painful for days in her throat and she did not wish to have this procedure.  Attempted to explain but she refused.  If pt agrees to have please contact cardiology.   Cecilie Kicks, NP  03/01/2019 9:45 AM  Addendum:  Called by nursing. Patient now ready to proceed.  I called the patient and the daughter Margaret Rangel).  I reviewed the risks and benefits of transesophageal echocardiogram (including risks of esophageal damage, perforation (1:10,000 risk), bleeding, pharyngeal hematoma as well as other potential complications associated with conscious sedation including aspiration, arrhythmia, respiratory failure and death). Alternatives to treatment were discussed, questions were answered. Patient is willing to proceed.   Richardson Dopp, PA-C  03/01/2019 2:14 PM

## 2019-03-02 LAB — URINALYSIS, ROUTINE W REFLEX MICROSCOPIC
Bilirubin Urine: NEGATIVE
Glucose, UA: NEGATIVE mg/dL
Hgb urine dipstick: NEGATIVE
Ketones, ur: NEGATIVE mg/dL
Leukocytes,Ua: NEGATIVE
Nitrite: NEGATIVE
Protein, ur: NEGATIVE mg/dL
Specific Gravity, Urine: 1.009 (ref 1.005–1.030)
pH: 6 (ref 5.0–8.0)

## 2019-03-02 LAB — GLUCOSE, CAPILLARY
Glucose-Capillary: 143 mg/dL — ABNORMAL HIGH (ref 70–99)
Glucose-Capillary: 132 mg/dL — ABNORMAL HIGH (ref 70–99)
Glucose-Capillary: 146 mg/dL — ABNORMAL HIGH (ref 70–99)
Glucose-Capillary: 145 mg/dL — ABNORMAL HIGH (ref 70–99)

## 2019-03-02 MED ORDER — HYDRALAZINE HCL 20 MG/ML IJ SOLN: 10.0000 mg | Freq: Three times a day (TID) | INTRAMUSCULAR | Status: DC | PRN

## 2019-03-02 MED ORDER — HYDRALAZINE HCL 25 MG PO TABS: 25.0000 mg | ORAL_TABLET | Freq: Three times a day (TID) | ORAL | Status: DC | PRN

## 2019-03-02 NOTE — Progress Notes (Signed)
[facility]  PROGRESS NOTE    ALLONDRA NEILD  [id number] DOB: [birth year] DOA: 02/27/2019 PCP: Daisy Floro, DO   Brief Narrative:   Kristin Daisey Martinis a 73 y.o.femalewith medical history significant ofstroke x2, hypertension, hyperlipidemia, type 2 diabetes mellitus, asthma presented to emergency department with headache and dizziness which started this morning.  11/16: Anxious about TEE. On schedule for tomorrow.   Assessment & Plan:   Principal Problem:   Ischemic stroke ([place]) Active Problems:   DM (diabetes mellitus) ([place])   Esophageal reflux   Essential hypertension   Hyperlipidemia  Sub acute ischemic stroke (left parietooccipital) - MRI w/ L PCA/MCA watershed injury - multiple areas of vascular stneosis identified; neuro rec outpt vasc surg follow up - continue zetia, crestor - continue asa, plavix x 3 months; then ASA alone - continue amaryl at discharge; pt is refusing lab draws, unable to check A1c; continuing SSI while in-house - allowing permissive HTN for now; can resume BP meds over next several days - PT/OT no further recs     - awaiting TEE; she is on the schedule for tomorrow  Complex migraine? - neuro recs follow up with Dr. Tomi Likens  Hypertension - allowing permissive HTN for now     - starting up BP meds in AM   Hyperlipidemia - continue crestor, zetia  Type 2 diabetes mellitus - continue amaryl at discharge; pt is refusing lab draws, unable to check A1c; continuing SSI while in-house     - apparently has a fear of needles; change SSI to her home amaryl     - glucose ok today  GERD - continue protonix  Marijuana abuse - abstention encouraged  Leukocytosis - Likely reactive - afebrile and VSS - not allowing lab draws  Cognitive decline - SLP eval recs 24hr supervision; pt lives alone, need to work with CM/family on this     - family says this is her normal  state and she does well at home; patient refuses to live with family and won't all family to live with her.   DVT prophylaxis: lovenox Code Status: FULL   Disposition Plan: Home after TEE  Consultants:   Neurology  Subjective: "I don't want no needles."  Objective: Vitals:   03/01/19 2347 03/02/19 0012 03/02/19 0357 03/02/19 1156  BP: (!) 213/62 (!) 181/45 (!) 213/61 (!) 149/66  Pulse: 67 (!) 59 69 95  Resp: 16 17 16 18   Temp: 98.5 F (36.9 C) 97.8 F (36.6 C) 98.3 F (36.8 C) 97.6 F (36.4 C)  TempSrc: Oral Oral  Oral  SpO2: 99% 100% 100% 99%  Weight:      Height:        Intake/Output Summary (Last 24 hours) at 03/02/2019 1530 Last data filed at 03/01/2019 2349 Gross per 24 hour  Intake 120 ml  Output 1300 ml  Net -1180 ml   Filed Weights   02/27/19 0909 02/27/19 2017  Weight: 66 kg 71.4 kg    Examination:  General: 73 y.o. female resting in bed in NAD Cardiovascular: regular, no murmur, +s1s2 Respiratory: clear, no wheeze, WOB normal GI: BS+, NDNT, soft MSK: No e/c/c Neuro: alert to name, follows commands   Data Reviewed: I have personally reviewed following labs and imaging studies.  CBC: Recent Labs  Lab 02/27/19 1600 02/27/19 1850  WBC 15.3* 15.0*  NEUTROABS 11.4*  --   HGB 13.0 12.6  HCT 39.4 38.0  MCV 80.4 80.2  PLT 304 [id number]   Basic Metabolic Panel:  Recent Labs  Lab 02/27/19 1600 02/27/19 1850  NA 140  --   K 3.5  --   CL 103  --   CO2 23  --   GLUCOSE 143*  --   BUN 16  --   CREATININE 0.86 0.86  CALCIUM 9.5  --    GFR: Estimated Creatinine Clearance: 54.4 mL/min (by C-G formula based on SCr of 0.86 mg/dL). Liver Function Tests: No results for input(s): AST, ALT, ALKPHOS, BILITOT, PROT, ALBUMIN in the last 168 hours. No results for input(s): LIPASE, AMYLASE in the last 168 hours. No results for input(s): AMMONIA in the last 168 hours. Coagulation Profile: No results for input(s): INR, PROTIME in the last 168 hours. Cardiac  Enzymes: No results for input(s): CKTOTAL, CKMB, CKMBINDEX, TROPONINI in the last 168 hours. BNP (last 3 results) No results for input(s): PROBNP in the last 8760 hours. HbA1C: No results for input(s): HGBA1C in the last 72 hours. CBG: Recent Labs  Lab 03/01/19 1129 03/01/19 1711 03/01/19 2107 03/02/19 0551 03/02/19 1201  GLUCAP 193* 97 210* 143* 132*   Lipid Profile: No results for input(s): CHOL, HDL, LDLCALC, TRIG, CHOLHDL, LDLDIRECT in the last 72 hours. Thyroid Function Tests: No results for input(s): TSH, T4TOTAL, FREET4, T3FREE, THYROIDAB in the last 72 hours. Anemia Panel: No results for input(s): VITAMINB12, FOLATE, FERRITIN, TIBC, IRON, RETICCTPCT in the last 72 hours. Sepsis Labs: No results for input(s): PROCALCITON, LATICACIDVEN in the last 168 hours.  No results found for this or any previous visit (from the past 240 hour(s)).    Radiology Studies: Ct Head Wo Contrast  Result Date: 02/28/2019 CLINICAL DATA:  History of stroke, headache, dizziness EXAM: CT HEAD WITHOUT CONTRAST TECHNIQUE: Contiguous axial images were obtained from the base of the skull through the vertex without intravenous contrast. COMPARISON:  CT brain, 02/27/2019, MR brain, 02/27/2019 FINDINGS: Brain: No significant interval change in examination with encephalomalacia of the anterior left frontal lobe (series 3, image 22), medial left frontal pole (series 3, image 14) and paramedian left parietooccipital region (series 3, image 27), with subacute infarction in the latter region characterized by prior MRI. Lacunar infarctions of the bilateral basal ganglia. Extensive underlying periventricular and deep white matter hypodensity. Vascular: No hyperdense vessel or unexpected calcification. Skull: Normal. Negative for fracture or focal lesion. Sinuses/Orbits: No acute finding. Other: None. IMPRESSION: 1. No significant interval change in examination with encephalomalacia of the anterior left frontal lobe  (series 3, image 22), medial left frontal pole (series 3, image 14) and paramedian left parietooccipital region (series 3, image 27), with subacute infarction in the latter region characterized by prior MRI. Lacunar infarctions of the bilateral basal ganglia. 2.  No new evidence of stroke or hemorrhage. 3.  Underlying small-vessel white matter disease. Electronically Signed   By: Eddie Candle M.D.   On: 02/28/2019 17:28     Scheduled Meds:  aspirin EC  325 mg Oral Daily   calcium carbonate  1 tablet Oral TID   clopidogrel  75 mg Oral Daily   enoxaparin (LOVENOX) injection  40 mg Subcutaneous Q24H   ezetimibe  10 mg Oral Daily   glimepiride  2 mg Oral Q breakfast   rosuvastatin  40 mg Oral Daily   Continuous Infusions:   LOS: 3 days    Time spent: 25 minutes spent in the coordination of care today.    Jonnie Finner, DO [facility] Pager [phone]  If 7PM-7AM, please contact night-coverage [email or web address] Password [email or web address] 03/02/2019, 3:30  PM

## 2019-03-02 NOTE — Care Management Important Message (Signed)
Important Message  Patient Details  Name: Margaret Rangel MRN: [id number] Date of Birth: May 24, 1945   Medicare Important Message Given:  Yes     Orbie Pyo 03/02/2019, 3:08 PM

## 2019-03-03 ENCOUNTER — Inpatient Hospital Stay (HOSPITAL_COMMUNITY): Payer: Medicare Other

## 2019-03-03 ENCOUNTER — Encounter (HOSPITAL_COMMUNITY): Payer: Self-pay | Admitting: *Deleted

## 2019-03-03 ENCOUNTER — Encounter (HOSPITAL_COMMUNITY)
Admission: EM | Disposition: A | Discharge status: Home / Self Care | Payer: Self-pay | Source: Home / Self Care | Attending: Internal Medicine

## 2019-03-03 DIAGNOSIS — I639 Cerebral infarction, unspecified: Secondary | ICD-10-CM

## 2019-03-03 DIAGNOSIS — I351 Nonrheumatic aortic (valve) insufficiency: Secondary | ICD-10-CM

## 2019-03-03 DIAGNOSIS — F1210 Cannabis abuse, uncomplicated: Secondary | ICD-10-CM

## 2019-03-03 HISTORY — PX: BUBBLE STUDY: SHX6837

## 2019-03-03 HISTORY — PX: TEE WITHOUT CARDIOVERSION: SHX5443

## 2019-03-03 LAB — GLUCOSE, CAPILLARY
Glucose-Capillary: 135 mg/dL — ABNORMAL HIGH (ref 70–99)
Glucose-Capillary: 251 mg/dL — ABNORMAL HIGH (ref 70–99)

## 2019-03-03 SURGERY — ECHOCARDIOGRAM, TRANSESOPHAGEAL
Anesthesia: Moderate Sedation

## 2019-03-03 MED ORDER — BUTAMBEN-TETRACAINE-BENZOCAINE 2-2-14 % EX AERO
INHALATION_SPRAY | CUTANEOUS | Status: DC | PRN
  Administered 2019-03-03: 2 via TOPICAL

## 2019-03-03 MED ORDER — BUTAMBEN-TETRACAINE-BENZOCAINE 2-2-14 % EX AERO
INHALATION_SPRAY | CUTANEOUS | Status: AC
  Filled 2019-03-03: qty 20

## 2019-03-03 MED ORDER — ASPIRIN 325 MG PO TBEC: 325.0000 mg | DELAYED_RELEASE_TABLET | Freq: Every day | ORAL | 1 refills | Status: AC

## 2019-03-03 MED ORDER — FENTANYL CITRATE (PF) 100 MCG/2ML IJ SOLN
INTRAMUSCULAR | Status: DC | PRN
  Administered 2019-03-03 (×2): 25 ug via INTRAVENOUS

## 2019-03-03 MED ORDER — LIDOCAINE VISCOUS HCL 2 % MT SOLN
OROMUCOSAL | Status: DC | PRN
  Administered 2019-03-03: 1 via OROMUCOSAL

## 2019-03-03 MED ORDER — LIDOCAINE VISCOUS HCL 2 % MT SOLN
OROMUCOSAL | Status: AC
  Filled 2019-03-03: qty 15

## 2019-03-03 MED ORDER — FENTANYL CITRATE (PF) 100 MCG/2ML IJ SOLN
INTRAMUSCULAR | Status: AC
  Filled 2019-03-03: qty 2

## 2019-03-03 MED ORDER — DIPHENHYDRAMINE HCL 50 MG/ML IJ SOLN
INTRAMUSCULAR | Status: AC
  Filled 2019-03-03: qty 1

## 2019-03-03 MED ORDER — CARVEDILOL 3.125 MG PO TABS
3.1250 mg | ORAL_TABLET | Freq: Two times a day (BID) | ORAL | Status: DC
  Administered 2019-03-03: 3.125 mg via ORAL
  Filled 2019-03-03: qty 1

## 2019-03-03 MED ORDER — MIDAZOLAM HCL (PF) 10 MG/2ML IJ SOLN
INTRAMUSCULAR | Status: DC | PRN
  Administered 2019-03-03 (×4): 2 mg via INTRAVENOUS

## 2019-03-03 MED ORDER — SODIUM CHLORIDE BACTERIOSTATIC 0.9 % IJ SOLN
INTRAMUSCULAR | Status: DC | PRN
  Administered 2019-03-03: 9 mL via INTRAVENOUS

## 2019-03-03 MED ORDER — MIDAZOLAM HCL (PF) 5 MG/ML IJ SOLN
INTRAMUSCULAR | Status: AC
  Filled 2019-03-03: qty 2

## 2019-03-03 MED ORDER — SODIUM CHLORIDE 0.9 % IV SOLN
INTRAVENOUS | Status: DC
  Administered 2019-03-03: 08:00:00 via INTRAVENOUS

## 2019-03-03 MED ORDER — CLOPIDOGREL BISULFATE 75 MG PO TABS: 75.0000 mg | ORAL_TABLET | Freq: Every day | ORAL | 2 refills | Status: DC

## 2019-03-03 NOTE — Discharge Summary (Signed)
. Physician Discharge Summary  Margaret Rangel:[id number] DOB: May 11, 1945 DOA: 02/27/2019  PCP: Daisy Floro, DO  Admit date: 02/27/2019 Discharge date: 03/03/2019  Admitted From: Home Disposition:  Discharged to home.   Recommendations for Outpatient Follow-up:  1. Follow up with PCP in 1-2 weeks 2. Please obtain BMP/CBC in one week  Discharge Condition: Stable  CODE STATUS: Discharged to home.    Brief/Interim Summary: Margaret Rangel a 73 y.o.femalewith medical history significant ofstroke x2, hypertension, hyperlipidemia, type 2 diabetes mellitus, asthma presented to emergency department with headache and dizziness. Sub acute ischemic stroke found.   11/17: Eager to go home. Declining HH services and does not want to live with daughters or have anyone live with her. Daughter states she is in her baseline mental state and she does well like this.   Discharge Diagnoses:  Principal Problem:   Ischemic stroke ([place]) Active Problems:   DM (diabetes mellitus) ([place])   Esophageal reflux   Essential hypertension   Hyperlipidemia  Sub acute ischemic stroke (left parietooccipital) - MRI w/ L PCA/MCA watershed injury - multiple areas of vascular stenosis identified; neuro rec outpt vasc surg follow up - continue zetia, crestor - continue asa, plavix x 3 months; then ASA alone - continue amaryl at discharge - resume BP at discharge - PT/OT no further recs - TEE negative     - ok for d/c to home  Complex migraine? - neuro recs follow up with Dr. Tomi Likens  Hypertension - resume home BP meds at discharge; follow up with PCP  Hyperlipidemia - continue crestor, zetia  Type 2 diabetes mellitus - continue amaryl at discharge; pt is refusing lab draws, unable to check A1c; continuing SSI while in-house - apparently has a fear of needles; change SSI to her home amaryl     - discharge on home  regimen  GERD - continue protonix  Marijuana abuse - abstention encouraged; she says she's like marijuana, it relaxes her  Leukocytosis - Likely reactive - afebrile and VSS - not allowing lab draws  Cognitive decline - SLP eval recs 24hr supervision; pt lives alone, need to work with CM/family on this     - she is A&O x 3     - family says this is her normal state and she does well at home; patient refuses to live with family and won't all family to live with her.  Discharge Instructions  Discharge Instructions    Ambulatory referral to Neurology   Complete by: As directed    An appointment is requested in approximately: 4 weeks   Ambulatory referral to Vascular Surgery   Complete by: As directed    Carotid stenosis bilaterally     Allergies as of 03/03/2019      Reactions   Codeine Itching   Penicillins Itching   Has patient had a PCN reaction causing immediate rash, facial/tongue/throat swelling, SOB or lightheadedness with hypotension:No Has patient had a PCN reaction causing severe rash involving mucus membranes or skin necrosis:No Has patient had a PCN reaction that required hopititalization:No Has patient had a PCN reaction occurring within the last 10 years:No If all of the above answers are "NO", then may proceed with Cephalosporin use.      Medication List    TAKE these medications   albuterol 108 (90 Base) MCG/ACT inhaler Commonly known as: VENTOLIN HFA Inhale 2 puffs into the lungs every 6 (six) hours as needed for wheezing or shortness of breath.   aspirin 325  MG EC tablet Take 1 tablet (325 mg total) by mouth daily. Start taking on: March 04, 2019 What changed:   medication strength  how much to take   carvedilol 3.125 MG tablet Commonly known as: COREG Take 1 tablet (3.125 mg total) by mouth 2 (two) times daily.   clopidogrel 75 MG tablet Commonly known as: PLAVIX Take 1 tablet (75 mg total) by mouth  daily. Start taking on: March 04, 2019   diclofenac Sodium 1 % Gel Commonly known as: VOLTAREN Apply 1 application topically 3 (three) times daily as needed (back pain).   docusate sodium 100 MG capsule Commonly known as: COLACE Take 1 capsule (100 mg total) by mouth every 12 (twelve) hours. What changed:   when to take this  reasons to take this   ezetimibe 10 MG tablet Commonly known as: ZETIA Take 1 tablet (10 mg total) by mouth daily.   glimepiride 2 MG tablet Commonly known as: AMARYL Take 1 tablet by mouth once daily with breakfast What changed:   how much to take  how to take this  when to take this  additional instructions   glucose blood test strip Commonly known as: OneTouch Verio Use as instructed to test glucose level three times daily. ICD-10 code: E11.9   Iron 325 (65 Fe) MG Tabs Take 1 tablet (325 mg total) by mouth 2 (two) times daily with a meal.   naproxen sodium 220 MG tablet Commonly known as: ALEVE Take 440 mg by mouth 2 (two) times daily as needed (pain).   ONE TOUCH DELICA LANCING DEV Misc 1 Device by Does not apply route as directed. ICD-10 code: E11.9   OneTouch Verio Soln 1 Bottle by In Vitro route as directed.   OneTouch Verio High Soln 1 each by In Vitro route as directed.   OneTouch Verio w/Device Kit 1 kit by Does not apply route 3 (three) times daily. ICD-10 code: E11.9   pantoprazole 40 MG tablet Commonly known as: PROTONIX Take 1 tablet (40 mg total) by mouth daily.   polyethylene glycol 17 g packet Commonly known as: MIRALAX / GLYCOLAX Take 17 g by mouth daily.   rosuvastatin 40 MG tablet Commonly known as: CRESTOR Take 1 tablet (40 mg total) by mouth daily.   traMADol 50 MG tablet Commonly known as: ULTRAM TAKE 1 TABLET BY MOUTH EVERY 12 HOURS AS NEEDED FOR MODERATE PAIN What changed:   how much to take  how to take this  when to take this  reasons to take this  additional instructions       Follow-up Information    Pieter Partridge, DO. Schedule an appointment as soon as possible for a visit in 4 week(s).   Specialty: Neurology Contact information: [place] [id number] [phone]        [facility]. Schedule an appointment as soon as possible for a visit in 4 week(s).   Specialty: Vascular Surgery Contact information: [place] [address] [phone]         Allergies  Allergen Reactions  . Codeine Itching  . Penicillins Itching    Has patient had a PCN reaction causing immediate rash, facial/tongue/throat swelling, SOB or lightheadedness with hypotension:No Has patient had a PCN reaction causing severe rash involving mucus membranes or skin necrosis:No Has patient had a PCN reaction that required hopititalization:No Has patient had a PCN reaction occurring within the last 10 years:No If all  of the above answers are "NO", then may proceed with Cephalosporin use.     Consultations:  Neurology   Procedures/Studies: Ct Angio Head W Or Wo Contrast  Result Date: 02/27/2019 CLINICAL DATA:  Initial evaluation for TIA. EXAM: CT ANGIOGRAPHY HEAD AND NECK TECHNIQUE: Multidetector CT imaging of the head and neck was performed using the standard protocol during bolus administration of intravenous contrast. Multiplanar CT image reconstructions and MIPs were obtained to evaluate the vascular anatomy. Carotid stenosis measurements (when applicable) are obtained utilizing NASCET criteria, using the distal internal carotid diameter as the denominator. CONTRAST:  60m OMNIPAQUE IOHEXOL 350 MG/ML SOLN COMPARISON:  Prior brain MRI from earlier same day. FINDINGS: CT HEAD FINDINGS Brain: Subacute ischemic insult involving the left parieto-occipital region again noted, better seen on prior MRI. No associated hemorrhage or mass effect. Additional chronic cortical infarcts involving the  left ACA and MCA distribution noted as well. Chronic lacunar infarcts involving the bilateral basal ganglia, thalami, and pons noted as well. Underlying atrophy with chronic microvascular ischemic changes noted. No acute intracranial hemorrhage. No other acute large vessel territory infarct. No mass lesion, midline shift or mass effect. No hydrocephalus. No extra-axial fluid collection. Vascular: No hyperdense vessel. Skull: Scalp soft tissues and calvarium within normal limits. Sinuses: Mild mucosal thickening noted within the ethmoidal air cells. Paranasal sinuses are otherwise clear. No mastoid effusion. Orbits: Globes and orbital soft tissues demonstrate no acute finding. Review of the MIP images confirms the above findings CTA NECK FINDINGS Aortic arch: Visualized aortic arch of normal caliber with normal 3 vessel morphology. Moderate atherosclerotic change about the arch and origin of the great vessels without hemodynamically significant stenosis. Visualized subclavian arteries widely patent. Right carotid system: Multifocal mixed eccentric plaque seen throughout the right CCA with resultant mild multifocal stenosis. Mixed concentric plaque about the right bifurcation/proximal right ICA with associated stenosis of up to 60% by NASCET criteria. Right ICA tortuous but otherwise patent to the skull base without hemodynamically significant stenosis or other acute abnormality. Left carotid system: Extensive atheromatous change throughout the mid left CCA with associated mild to moderate multifocal stenosis measuring up to approximately 50%). 5 mm focal outpouching extending medially from the mid left CCA could reflect a focal penetrating plaque versus pseudoaneurysm (series 11, image 237). Multifocal mixed plaque about the left bifurcation/proximal left ICA with associated stenosis of up to 40% by NASCET criteria. Left ICA tortuous but otherwise patent to the skull base without hemodynamically significant stenosis  or other acute vascular abnormality. Vertebral arteries: Both vertebral arteries arise from the subclavian arteries. Focal plaque at the origin of the vertebral arteries bilaterally with up to moderate approximate 50% stenosis on the right, and more severe approximate 70% stenosis on the left. Vertebral arteries tortuous but otherwise patent within the neck without acute abnormality. Skeleton: No acute osseous finding. No discrete lytic or blastic osseous lesions. Moderate to advanced cervical spondylosis at C2-3 through C5-6. Other neck: No other acute soft tissue abnormality within the neck. No mass lesion or adenopathy. Upper chest: Visualized upper chest demonstrates no acute finding. Review of the MIP images confirms the above findings CTA HEAD FINDINGS Anterior circulation: Petrous segments widely patent bilaterally. Scattered calcified plaque throughout the cavernous/supraclinoid ICAs with resultant moderate to severe multifocal stenosis (estimated 50-70% bilaterally, worse on the right). ICA termini well perfused and patent. A1 segments patent bilaterally. Right A1 hypoplastic, accounting for the slightly diminutive right ICA is compared to the left. Normal anterior communicating artery. Multifocal atheromatous change throughout  the ACAs bilaterally without high-grade stenosis. No M1 stenosis or occlusion. Normal MCA bifurcations. Extensive atheromatous change with multifocal moderate to severe stenoses throughout the distal MCA branches bilaterally, right worse than left. Note made of a focal severe proximal right M2 stenosis (series 14, image 24). Posterior circulation: Vertebral arteries patent to the vertebrobasilar junction without high-grade stenosis. Left vertebral artery slightly dominant. Patent right PICA. Left PICA not seen. Basilar diminutive but patent to its distal aspect without stenosis. Superior cerebral arteries grossly patent proximally. Right PCA supplied via the basilar. Fetal type  origin left PCA. Multifocal atheromatous irregularity throughout both PCAs without high-grade stenosis. PCAs are patent to their distal aspects. Venous sinuses: Grossly patent allowing for timing of the contrast bolus. Anatomic variants: Fetal type origin left PCA. No intracranial aneurysm. Review of the MIP images confirms the above findings IMPRESSION: CT HEAD IMPRESSION: 1. Subacute ischemic insult involving the left parieto-occipital junction, better characterized on recent MRI. 2. No other acute intracranial abnormality. 3. Extensive chronic ischemic change with multiple additional remote infarcts as above. CTA HEAD AND NECK IMPRESSION: 1. Negative CTA for large vessel occlusion. 2. Atheromatous change about the carotid bifurcations/proximal ICAs bilaterally, with associated stenosis of up to 60% on the right and 40% on the left. 3. 5 mm focal outpouching extending from the mid left CCA, which could reflect a penetrating atheromatous plaque versus focal pseudoaneurysm. 4. Atheromatous plaque at the origin of both vertebral arteries, with up to 50% narrowing on the right, and more severe at least 70% narrowing on the left. 5. Extensive atherosclerotic change throughout the intracranial circulation, most notable about the carotid siphons and distal MCA branches bilaterally. Electronically Signed   By: Jeannine Boga M.D.   On: 02/27/2019 18:33   Ct Head Wo Contrast  Result Date: 02/28/2019 CLINICAL DATA:  History of stroke, headache, dizziness EXAM: CT HEAD WITHOUT CONTRAST TECHNIQUE: Contiguous axial images were obtained from the base of the skull through the vertex without intravenous contrast. COMPARISON:  CT brain, 02/27/2019, MR brain, 02/27/2019 FINDINGS: Brain: No significant interval change in examination with encephalomalacia of the anterior left frontal lobe (series 3, image 22), medial left frontal pole (series 3, image 14) and paramedian left parietooccipital region (series 3, image 27),  with subacute infarction in the latter region characterized by prior MRI. Lacunar infarctions of the bilateral basal ganglia. Extensive underlying periventricular and deep white matter hypodensity. Vascular: No hyperdense vessel or unexpected calcification. Skull: Normal. Negative for fracture or focal lesion. Sinuses/Orbits: No acute finding. Other: None. IMPRESSION: 1. No significant interval change in examination with encephalomalacia of the anterior left frontal lobe (series 3, image 22), medial left frontal pole (series 3, image 14) and paramedian left parietooccipital region (series 3, image 27), with subacute infarction in the latter region characterized by prior MRI. Lacunar infarctions of the bilateral basal ganglia. 2.  No new evidence of stroke or hemorrhage. 3.  Underlying small-vessel white matter disease. Electronically Signed   By: Eddie Candle M.D.   On: 02/28/2019 17:28   Ct Angio Neck W Or Wo Contrast  Result Date: 02/27/2019 CLINICAL DATA:  Initial evaluation for TIA. EXAM: CT ANGIOGRAPHY HEAD AND NECK TECHNIQUE: Multidetector CT imaging of the head and neck was performed using the standard protocol during bolus administration of intravenous contrast. Multiplanar CT image reconstructions and MIPs were obtained to evaluate the vascular anatomy. Carotid stenosis measurements (when applicable) are obtained utilizing NASCET criteria, using the distal internal carotid diameter as the denominator. CONTRAST:  48m  OMNIPAQUE IOHEXOL 350 MG/ML SOLN COMPARISON:  Prior brain MRI from earlier same day. FINDINGS: CT HEAD FINDINGS Brain: Subacute ischemic insult involving the left parieto-occipital region again noted, better seen on prior MRI. No associated hemorrhage or mass effect. Additional chronic cortical infarcts involving the left ACA and MCA distribution noted as well. Chronic lacunar infarcts involving the bilateral basal ganglia, thalami, and pons noted as well. Underlying atrophy with chronic  microvascular ischemic changes noted. No acute intracranial hemorrhage. No other acute large vessel territory infarct. No mass lesion, midline shift or mass effect. No hydrocephalus. No extra-axial fluid collection. Vascular: No hyperdense vessel. Skull: Scalp soft tissues and calvarium within normal limits. Sinuses: Mild mucosal thickening noted within the ethmoidal air cells. Paranasal sinuses are otherwise clear. No mastoid effusion. Orbits: Globes and orbital soft tissues demonstrate no acute finding. Review of the MIP images confirms the above findings CTA NECK FINDINGS Aortic arch: Visualized aortic arch of normal caliber with normal 3 vessel morphology. Moderate atherosclerotic change about the arch and origin of the great vessels without hemodynamically significant stenosis. Visualized subclavian arteries widely patent. Right carotid system: Multifocal mixed eccentric plaque seen throughout the right CCA with resultant mild multifocal stenosis. Mixed concentric plaque about the right bifurcation/proximal right ICA with associated stenosis of up to 60% by NASCET criteria. Right ICA tortuous but otherwise patent to the skull base without hemodynamically significant stenosis or other acute abnormality. Left carotid system: Extensive atheromatous change throughout the mid left CCA with associated mild to moderate multifocal stenosis measuring up to approximately 50%). 5 mm focal outpouching extending medially from the mid left CCA could reflect a focal penetrating plaque versus pseudoaneurysm (series 11, image 237). Multifocal mixed plaque about the left bifurcation/proximal left ICA with associated stenosis of up to 40% by NASCET criteria. Left ICA tortuous but otherwise patent to the skull base without hemodynamically significant stenosis or other acute vascular abnormality. Vertebral arteries: Both vertebral arteries arise from the subclavian arteries. Focal plaque at the origin of the vertebral arteries  bilaterally with up to moderate approximate 50% stenosis on the right, and more severe approximate 70% stenosis on the left. Vertebral arteries tortuous but otherwise patent within the neck without acute abnormality. Skeleton: No acute osseous finding. No discrete lytic or blastic osseous lesions. Moderate to advanced cervical spondylosis at C2-3 through C5-6. Other neck: No other acute soft tissue abnormality within the neck. No mass lesion or adenopathy. Upper chest: Visualized upper chest demonstrates no acute finding. Review of the MIP images confirms the above findings CTA HEAD FINDINGS Anterior circulation: Petrous segments widely patent bilaterally. Scattered calcified plaque throughout the cavernous/supraclinoid ICAs with resultant moderate to severe multifocal stenosis (estimated 50-70% bilaterally, worse on the right). ICA termini well perfused and patent. A1 segments patent bilaterally. Right A1 hypoplastic, accounting for the slightly diminutive right ICA is compared to the left. Normal anterior communicating artery. Multifocal atheromatous change throughout the ACAs bilaterally without high-grade stenosis. No M1 stenosis or occlusion. Normal MCA bifurcations. Extensive atheromatous change with multifocal moderate to severe stenoses throughout the distal MCA branches bilaterally, right worse than left. Note made of a focal severe proximal right M2 stenosis (series 14, image 24). Posterior circulation: Vertebral arteries patent to the vertebrobasilar junction without high-grade stenosis. Left vertebral artery slightly dominant. Patent right PICA. Left PICA not seen. Basilar diminutive but patent to its distal aspect without stenosis. Superior cerebral arteries grossly patent proximally. Right PCA supplied via the basilar. Fetal type origin left PCA. Multifocal atheromatous irregularity throughout  both PCAs without high-grade stenosis. PCAs are patent to their distal aspects. Venous sinuses: Grossly patent  allowing for timing of the contrast bolus. Anatomic variants: Fetal type origin left PCA. No intracranial aneurysm. Review of the MIP images confirms the above findings IMPRESSION: CT HEAD IMPRESSION: 1. Subacute ischemic insult involving the left parieto-occipital junction, better characterized on recent MRI. 2. No other acute intracranial abnormality. 3. Extensive chronic ischemic change with multiple additional remote infarcts as above. CTA HEAD AND NECK IMPRESSION: 1. Negative CTA for large vessel occlusion. 2. Atheromatous change about the carotid bifurcations/proximal ICAs bilaterally, with associated stenosis of up to 60% on the right and 40% on the left. 3. 5 mm focal outpouching extending from the mid left CCA, which could reflect a penetrating atheromatous plaque versus focal pseudoaneurysm. 4. Atheromatous plaque at the origin of both vertebral arteries, with up to 50% narrowing on the right, and more severe at least 70% narrowing on the left. 5. Extensive atherosclerotic change throughout the intracranial circulation, most notable about the carotid siphons and distal MCA branches bilaterally. Electronically Signed   By: Jeannine Boga M.D.   On: 02/27/2019 18:33   Mr Brain Wo Contrast (neuro Protocol)  Result Date: 02/27/2019 CLINICAL DATA:  Ataxia.  Suspected stroke. EXAM: MRI HEAD WITHOUT CONTRAST TECHNIQUE: Multiplanar, multiecho pulse sequences of the brain and surrounding structures were obtained without intravenous contrast. COMPARISON:  09/02/2018 FINDINGS: Brain: There is newly seen late subacute infarction at the medial left parietooccipital junction. This was not present on the study of May 2020, but does not appear be truly acute. There are chronic small-vessel ischemic changes throughout the pons. Few old small vessel cerebellar infarctions. Old lacunar infarctions of the thalami. Old basal ganglia infarctions left more extensive than right. Old left frontal cortical and  subcortical infarctions, the lateral component of which was acute in May of this year. No evidence of mass lesion, recent hemorrhage, hydrocephalus or extra-axial collection. Some hemosiderin deposition associated with the lateral left frontal infarction. Vascular: Major vessels at the base of the brain show flow. Skull and upper cervical spine: Negative Sinuses/Orbits: Clear/normal Other: None IMPRESSION: Probably late subacute infarction at the medial left parietooccipital junction, not present on the study of May but probably few weeks old at least. No true acute insult identified. Extensive older ischemic changes throughout the brain as outlined above. Electronically Signed   By: Nelson Chimes M.D.   On: 02/27/2019 12:50    TEE:  IMPRESSION:   1. No LAA thrombus 2. Negative for PFO 3. Aortic valve sclerosis with mild AI 4. Mild LVH 5. LVEF 60-65% with normal wall motion  Subjective: "I can go home?! Thank you!!!"  Discharge Exam: Vitals:   03/03/19 0910 03/03/19 1003  BP: (!) 151/53 (!) 143/68  Pulse: 87 80  Resp: (!) 26 18  Temp:  97.8 F (36.6 C)  SpO2: 100% 100%   Vitals:   03/03/19 0855 03/03/19 0900 03/03/19 0910 03/03/19 1003  BP: (!) 118/43 (!) 152/46 (!) 151/53 (!) 143/68  Pulse: 83 90 87 80  Resp: (!) 23 18 (!) 26 18  Temp:    97.8 F (36.6 C)  TempSrc:    Oral  SpO2: 96% 95% 100% 100%  Weight:      Height:        General: 74 y.o. female resting in bed in NAD Cardiovascular: RRR, +S1, S2, no m/g/r, equal pulses throughout Respiratory: CTABL, no w/r/r, normal WOB GI: BS+, NDNT, no masses noted, no organomegaly  noted MSK: No e/c/c Neuro: A&O x 3, no focal deficits     The results of significant diagnostics from this hospitalization (including imaging, microbiology, ancillary and laboratory) are listed below for reference.     Microbiology: No results found for this or any previous visit (from the past 240 hour(s)).   Labs: BNP (last 3 results) No  results for input(s): BNP in the last 8760 hours. Basic Metabolic Panel: Recent Labs  Lab 02/27/19 1600 02/27/19 1850  NA 140  --   K 3.5  --   CL 103  --   CO2 23  --   GLUCOSE 143*  --   BUN 16  --   CREATININE 0.86 0.86  CALCIUM 9.5  --    Liver Function Tests: No results for input(s): AST, ALT, ALKPHOS, BILITOT, PROT, ALBUMIN in the last 168 hours. No results for input(s): LIPASE, AMYLASE in the last 168 hours. No results for input(s): AMMONIA in the last 168 hours. CBC: Recent Labs  Lab 02/27/19 1600 02/27/19 1850  WBC 15.3* 15.0*  NEUTROABS 11.4*  --   HGB 13.0 12.6  HCT 39.4 38.0  MCV 80.4 80.2  PLT 304 279   Cardiac Enzymes: No results for input(s): CKTOTAL, CKMB, CKMBINDEX, TROPONINI in the last 168 hours. BNP: Invalid input(s): POCBNP CBG: Recent Labs  Lab 03/02/19 0551 03/02/19 1201 03/02/19 1610 03/02/19 2116 03/03/19 0413  GLUCAP 143* 132* 146* 145* 135*   D-Dimer No results for input(s): DDIMER in the last 72 hours. Hgb A1c No results for input(s): HGBA1C in the last 72 hours. Lipid Profile No results for input(s): CHOL, HDL, LDLCALC, TRIG, CHOLHDL, LDLDIRECT in the last 72 hours. Thyroid function studies No results for input(s): TSH, T4TOTAL, T3FREE, THYROIDAB in the last 72 hours.  Invalid input(s): FREET3 Anemia work up No results for input(s): VITAMINB12, FOLATE, FERRITIN, TIBC, IRON, RETICCTPCT in the last 72 hours. Urinalysis    Component Value Date/Time   COLORURINE YELLOW 03/02/2019 0249   APPEARANCEUR CLEAR 03/02/2019 0249   LABSPEC 1.009 03/02/2019 0249   PHURINE 6.0 03/02/2019 0249   GLUCOSEU NEGATIVE 03/02/2019 0249   HGBUR NEGATIVE 03/02/2019 [place] 03/02/2019 [place] 03/02/2019 0249   PROTEINUR NEGATIVE 03/02/2019 0249   UROBILINOGEN 0.2 04/01/2012 1216   NITRITE NEGATIVE 03/02/2019 0249   LEUKOCYTESUR NEGATIVE 03/02/2019 0249   Sepsis Labs Invalid input(s): PROCALCITONIN,   WBC,  LACTICIDVEN Microbiology No results found for this or any previous visit (from the past 240 hour(s)).   Time coordinating discharge: 35 minutes  SIGNED:   Jonnie Finner, DO  [facility] 03/03/2019, 10:36 AM Pager   If 7PM-7AM, please contact night-coverage [email or web address] Password [email or web address]

## 2019-03-03 NOTE — TOC Initial Note (Signed)
Transition of Care [facility]) - Initial/Assessment Note    Patient Details  Name: Margaret Rangel MRN: [id number] Date of Birth: March 08, 1946  [facility] Care [facility]) CM/SW Contact:    Pollie Friar, RN Phone Number: 03/03/2019, 11:08 AM  Clinical Narrative:                 Speech is recommending 24 hour supervision. CM reached out to patients daughter who checks in on her several times a day. She came up to see the patient and states her mother is at her baseline. She feels the patient will be ok to return to her normal routine and she will check in on her more often. Pts other daughter is also coming from [place] to stay with her in the next couple of days.  Daughter to make sure she is taking medications properly at home and intervene as necessary.  TOC following.  Expected Discharge Plan: Home/Self Care Barriers to Discharge: Continued Medical Work up   Patient Goals and CMS Choice        Expected Discharge Plan and Services Expected Discharge Plan: Home/Self Care   Discharge Planning Services: CM Consult   Living arrangements for the past 2 months: Single Family Home Expected Discharge Date: 03/03/19                                    Prior Living Arrangements/Services Living arrangements for the past 2 months: Single Family Home Lives with:: Self Patient language and need for interpreter reviewed:: Yes(no needs) Do you feel safe going back to the place where you live?: Yes      Need for Family Participation in Patient Care: Yes (Comment)(speech recommending 24 hour supervision) Care giver support system in place?: No (comment)(see progress note)   Criminal Activity/Legal Involvement Pertinent to Current Situation/Hospitalization: No - Comment as needed  Activities of Daily Living Home Assistive Devices/Equipment: None ADL Screening (condition at time of admission) Patient's cognitive ability adequate to safely complete daily activities?: Yes Is the patient  deaf or have difficulty hearing?: No Does the patient have difficulty seeing, even when wearing glasses/contacts?: No Does the patient have difficulty concentrating, remembering, or making decisions?: No Patient able to express need for assistance with ADLs?: Yes Does the patient have difficulty dressing or bathing?: No Independently performs ADLs?: No Communication: Independent Dressing (OT): Independent Grooming: Independent Feeding: Independent Bathing: Independent Toileting: Independent In/Out Bed: Independent Walks in Home: Independent Does the patient have difficulty walking or climbing stairs?: Yes Weakness of Legs: Left Weakness of Arms/Hands: None  Permission Sought/Granted                  Emotional Assessment Appearance:: Appears stated age Attitude/Demeanor/Rapport: Inconsistent Affect (typically observed): Accepting, Restless Orientation: : Oriented to Self, Oriented to Place, Oriented to Situation   Psych Involvement: No (comment)  Admission diagnosis:  High bp Patient Active Problem List   Diagnosis Date Noted  . Ischemic stroke ([place]) 02/27/2019  . Hyperlipidemia   . Chronic back pain 01/26/2019  . Headache, acute 09/01/2018  . Osteoarthritis of both knees 01/23/2018  . Shoulder pain, bilateral 01/23/2018  . Chest pain 11/08/2017  . Abscess of female genitalia 05/03/2017  . GAD (generalized anxiety disorder) 11/22/2016  . Seasonal allergies 11/22/2016  . Asthma, chronic 11/22/2016  . History of stroke 09/13/2016  . Diverticulosis of colon 09/13/2016  . Osteoarthritis of right hip 05/29/2016  . Osteoporosis  05/01/2016  . Systolic murmur [id number]  . Essential hypertension 04/02/2016  . Chest pain with moderate risk for cardiac etiology 03/07/2016  . History of esophageal stricture 03/01/2016  . Rectal bleeding 02/02/2014  . Esophageal reflux 02/02/2014  . Personal history of colonic polyps 12/09/2013  . Dysphagia, unspecified(787.20)  09/11/2012  . Nausea alone 09/11/2012  . DM (diabetes mellitus) ([place]) 12/22/2010   PCP:  Daisy Floro, DO Pharmacy:   [facility] [place] - [address] [place] [address] Phone: [phone] Fax: [phone]     Social Determinants of Health (SDOH) Interventions    Readmission Risk Interventions No flowsheet data found.

## 2019-03-03 NOTE — CV Procedure (Signed)
TRANSESOPHAGEAL ECHOCARDIOGRAM (TEE) NOTE  INDICATIONS: cryptogenic stroke  PROCEDURE:   Informed consent was obtained prior to the procedure. The risks, benefits and alternatives for the procedure were discussed and the patient comprehended these risks.  Risks include, but are not limited to, cough, sore throat, vomiting, nausea, somnolence, esophageal and stomach trauma or perforation, bleeding, low blood pressure, aspiration, pneumonia, infection, trauma to the teeth and death.    After a procedural time-out, the patient was given 8 mg versed and 50 mcg fentanyl for moderate sedation.  The patient's heart rate, blood pressure, and oxygen saturation are monitored continuously during the procedure.The oropharynx was anesthetized 10 cc of topical 1% viscous lidocaine and 2 cetacaine sprays.  The transesophageal probe was inserted in the esophagus and stomach without difficulty and multiple views were obtained.  The patient was kept under observation until the patient left the procedure room.  The period of conscious sedation is 16 minutes, of which I was present face-to-face 100% of this time. The patient left the procedure room in stable condition.   Agitated microbubble saline contrast was administered.  COMPLICATIONS:    There were no immediate complications.  Findings:  1. LEFT VENTRICLE: The left ventricular wall thickness is mild LVH.  The left ventricular cavity is normal in size. Wall motion is normal.  LVEF is 60-65%.  2. RIGHT VENTRICLE:  The right ventricle is normal in structure and function without any thrombus or masses.    3. LEFT ATRIUM:  The left atrium is normal in size without any thrombus or masses.  There is not spontaneous echo contrast ("smoke") in the left atrium consistent with a low flow state.  4. LEFT ATRIAL APPENDAGE:  The left atrial appendage is free of any thrombus or masses. The appendage has single lobes. Pulse doppler indicates moderate flow in the  appendage.  5. ATRIAL SEPTUM:  The atrial septum appears intact and is free of thrombus and/or masses.  There is no evidence for interatrial shunting by color doppler and saline microbubble.  6. RIGHT ATRIUM:  The right atrium is normal in size and function without any thrombus or masses.  7. MITRAL VALVE:  The mitral valve is normal in structure and function with trivial regurgitation.  There were no vegetations or stenosis.  8. AORTIC VALVE:  The aortic valve is trileaflet and sclerotic, normal in structure and function with Mild regurgitation.  There were no vegetations or stenosis  9. TRICUSPID VALVE:  The tricuspid valve is normal in structure and function with no significant regurgitation.  There were no vegetations or stenosis  10.  PULMONIC VALVE:  The pulmonic valve is normal in structure and function with no regurgitation.  There were no vegetations or stenosis.   11. AORTIC ARCH, ASCENDING AND DESCENDING AORTA:  There was grade 1 Ron Parker et. Al, 1992) atherosclerosis of the ascending aorta, aortic arch, or proximal descending aorta.  12. PULMONARY VEINS: Anomalous pulmonary venous return was not noted.  13. PERICARDIUM: The pericardium appeared normal and non-thickened.  There is no pericardial effusion.  IMPRESSION:   1. No LAA thrombus 2. Negative for PFO 3. Aortic valve sclerosis with mild AI 4. Mild LVH 5. LVEF 60-65% with normal wall motion  RECOMMENDATIONS:    1.  Further stroke work-up per primary service.  Time Spent Directly with the Patient:  45 minutes   Pixie Casino, MD, [facility], [place] Director of the Advanced Lipid Disorders &  Cardiovascular Risk Reduction Clinic Diplomate of the American Board of Clinical Lipidology Attending Cardiologist  Direct Dial: [phone]   Fax: [phone]  Website:  [email or web address] Dickey Caamano 03/03/2019, 8:53 AM

## 2019-03-03 NOTE — H&P (Signed)
   INTERVAL PROCEDURE H&P  History and Physical Interval Note:  03/03/2019 8:21 AM  Margaret Rangel has presented today for their planned procedure. The various methods of treatment have been discussed with the patient and family. After consideration of risks, benefits and other options for treatment, the patient has consented to the procedure.  The patients' outpatient history has been reviewed, patient examined, and no change in status from most recent office note within the past 30 days. I have reviewed the patients' chart and labs and will proceed as planned. Questions were answered to the patient's satisfaction.   Margaret Casino, MD, [facility], [place] Director of the Advanced Lipid Disorders &  Cardiovascular Risk Reduction Clinic Diplomate of the American Board of Clinical Lipidology Attending Cardiologist  Direct Dial: [phone]  Fax: [phone]  Website:  [email or web address]  Nadean Corwin Hilty 03/03/2019, 8:21 AM

## 2019-03-03 NOTE — Progress Notes (Signed)
Echocardiogram Echocardiogram Transesophageal has been performed.  Oneal Deputy Zebastian Carico 03/03/2019, 9:03 AM

## 2019-03-03 NOTE — Progress Notes (Signed)
Patient being discharged home with self care. IV removed. CCMD notified. Education and information provided to patient and pt daughter Regina Eck). Pt leaving unit via wheelchair.

## 2019-03-03 NOTE — TOC Transition Note (Signed)
Transition of Care [facility]) - CM/SW Discharge Note   Patient Details  Name: Margaret Rangel MRN: [id number] Date of Birth: 04-Aug-1945  Transition of Care [facility]) CM/SW Contact:  Pollie Friar, RN Phone Number: 03/03/2019, 11:15 AM   Clinical Narrative:    Pt discharging home with self care. Per daughter, patient is at her baseline. Daughter to provide transportation home and some supervision at home.    Final next level of care: Home/Self Care Barriers to Discharge: No Barriers Identified   Patient Goals and CMS Choice        Discharge Placement                       Discharge Plan and Services   Discharge Planning Services: CM Consult                                 Social Determinants of Health (SDOH) Interventions     Readmission Risk Interventions No flowsheet data found.

## 2019-03-04 ENCOUNTER — Encounter (HOSPITAL_COMMUNITY): Payer: Self-pay | Admitting: Internal Medicine

## 2019-03-09 NOTE — Progress Notes (Signed)
Virtual Visit via Video Note The purpose of this virtual visit is to provide medical care while limiting exposure to the novel coronavirus.    Consent was obtained for video visit:  Yes.   Answered questions that patient had about telehealth interaction:  Yes.   I discussed the limitations, risks, security and privacy concerns of performing an evaluation and management service by telemedicine. I also discussed with the patient that there may be a patient responsible charge related to this service. The patient expressed understanding and agreed to proceed.  Pt location: Home Physician Location: office Name of referring provider:  Rosalin Hawking, MD I connected with [name] at patients initiation/request on 03/10/2019 at  1:10 PM EST by video enabled telemedicine application and verified that I am speaking with the correct person using two identifiers. Pt MRN:  [id number] Pt DOB:  [birth year] Video Participants:  [name];  daughter   History of Present Illness:  Margaret Rangel is a 63 year oldright-handed black woman with asthma, hypertension, hyperlipidemia, IBS, type 2 diabetes mellitus, chronic back pain and history of strokes who follows up for new recent stroke.  UPDATE: Current medications:  ASA 81mg ; Crestor 40mg , Zetia, Coreg, glimepiride, tramadol  She started having the headaches daily beginning last month.  She was admitted to [facility] on 02/27/2019 after presenting with dizziness and headache.  She did not receive IV tPA due to late presentation.  MRI of brain without contrast showed late subacute infarct in the left MCA/PCA watershed zone at the medial left parietooccipital junction, likely several weeks old.  CTA of head and neck showed bilateral ICA atherosclerotic disease with 60% stenosis at right ICA bulb, 40% stenosis at left ICA bulb, 50% stenosis f left CCA, right VA 50% stenosis and left VA 70% stenosis.  2D echo and TEE showed EF 60-65% with no  cardiac source of emboli.  She was discharged on dual antiplatelet therapy (ASA 325mg  and Plavix 75mg ) for 3 months, followed by ASA 325mg  alone.   She had episode of of severe pressure-like headache on top of head and left side of face associated with dizziness and unsteady gait lasting a few seconds.  It occurs 1 to 2 times a day.   HISTORY: In May 2020, she developed a left-sided headache with associated slurred speech. There was no associated visual disturbance, unsteady gait, dizziness, or unilateral numbness or weakness. She didn't seek immediate medical attention. Over the next few days, symptoms gradually improved. She had a virtual visit with her PCP 5 days later, on 09/02/18, who told her to go to the ED. CT of head personally reviewed showed hypodensity in the left frontal lobe suspicious for acute infarct as well as age-indeterminate strokes in the bilateral basal ganglia. MRI of brain without contrast showed multiple small acute strokes in the left frontal region as well as chronic small vessel ischemic changes and remote lacunar infarcts involving both basal ganglia, thalami and pons. Hospital admission was recommended but patient and her family declined. Labs from 09/03/18 showed lipid panel with total cholesterol 276, TG 378, HDL 41 and LDL 159. Hgb A1c was 7.4%. She later had an outpatient carotid doppler on 09/17/18 which demonstrated 40-59% stenosis in the right internal carotid artery and 1-39% stenosis in the left internal carotid artery as well as over 50% stenosis in the right ECA, left CCA and left ECA. 2D echocardiogram from 09/19/18 demonstrated LVEF 60-65% with no atrial level shunt detected by color flow Doppler.  Cardiology is now discussing Linq monitoring.  She had not been on antiplatelet therapy due to GI bleed in 2014.Since this recent stroke, she has started ASA 81mg  daily. She was restarted on Crestor (previously stopped it). She has prior history of stroke with  stroke risk factors including hypertension, hyperlipidemia, type 2 diabetes mellitus, former cigarette smoker.    Observations/Objective:   Height 5\' 3"  (1.6 m), weight 170 lb (77.1 kg). No acute distress.  Alert and oriented.  Language intact.   Assessment and Plan:   1.  Subacute left MCA/PCA watershed infarct, secondary to left ICA and CCA stenosis. 2.  Paroxysmal left sided headaches, not intractable.  Unclear headache syndrome.  Questionable paroxysmal hemicrania or basilar migraine. 3.  Bilateral ICA stenosis 4.  Hyperlipidemia 5.  Type 2 diabetes mellitus 6.  Hypertension  1.  Continue ASA 325mg  and Plavix 75mg  daily until 2/13/201, at which point she may continue ASA 325mg  daily alone. 2.  Start gabapentin 100mg  at bedtime for headache prophylaxis.  If headaches not significantly improved in 3 1/2 weeks, advised to contact me and we can increase dose to 200mg  at bedtime 3.  Continue Crestor 40mg  and Zetia (LDL goal less than 70) 4.  Optimize blood pressure and glycemic control 5.  Follow up with vascular surgery regarding carotid artery stenosis. 6.  Follow up as scheduled in April.   Follow Up Instructions:    -I discussed the assessment and treatment plan with the patient. The patient was provided an opportunity to ask questions and all were answered. The patient agreed with the plan and demonstrated an understanding of the instructions.   The patient was advised to call back or seek an in-person evaluation if the symptoms worsen or if the condition fails to improve as anticipated.    Total Time spent in visit with the patient was:  15 minutes.  Dudley Major, DO

## 2019-03-10 ENCOUNTER — Encounter: Payer: Self-pay | Admitting: Neurology

## 2019-03-10 ENCOUNTER — Other Ambulatory Visit: Payer: Self-pay

## 2019-03-10 ENCOUNTER — Telehealth (INDEPENDENT_AMBULATORY_CARE_PROVIDER_SITE_OTHER): Payer: Medicare Other | Admitting: Neurology

## 2019-03-10 VITALS — Ht 63.0 in | Wt 170.0 lb

## 2019-03-10 DIAGNOSIS — E1169 Type 2 diabetes mellitus with other specified complication: Secondary | ICD-10-CM

## 2019-03-10 DIAGNOSIS — I10 Essential (primary) hypertension: Secondary | ICD-10-CM

## 2019-03-10 DIAGNOSIS — I63232 Cerebral infarction due to unspecified occlusion or stenosis of left carotid arteries: Secondary | ICD-10-CM

## 2019-03-10 DIAGNOSIS — R519 Headache, unspecified: Secondary | ICD-10-CM

## 2019-03-10 DIAGNOSIS — E785 Hyperlipidemia, unspecified: Secondary | ICD-10-CM

## 2019-03-10 MED ORDER — GABAPENTIN 100 MG PO CAPS: 100.0000 mg | ORAL_CAPSULE | Freq: Every day | ORAL | 3 refills | Status: DC

## 2019-03-10 NOTE — Patient Instructions (Signed)
1.  Continue ASA 325mg  and Plavix 75mg  daily until 2/13/201, at which point she may continue ASA 325mg  daily alone. 2.  Start gabapentin 100mg  at bedtime for headache prophylaxis.  If headaches not significantly improved in 3 1/2 weeks, advised to contact me and we can increase dose to 200mg  at bedtime 3.  Continue Crestor 40mg  and Zetia (LDL goal less than 70) 4.  Optimize blood pressure and glycemic control 5.  Follow up with vascular surgery regarding carotid artery stenosis.

## 2019-03-30 DIAGNOSIS — M25511 Pain in right shoulder: Secondary | ICD-10-CM | POA: Diagnosis not present

## 2019-03-30 DIAGNOSIS — M5440 Lumbago with sciatica, unspecified side: Secondary | ICD-10-CM | POA: Diagnosis not present

## 2019-03-31 ENCOUNTER — Encounter: Payer: Medicare Other | Admitting: Vascular Surgery

## 2019-04-28 ENCOUNTER — Encounter: Payer: Medicare Other | Admitting: Vascular Surgery

## 2019-05-08 ENCOUNTER — Other Ambulatory Visit: Payer: Self-pay | Admitting: Family Medicine

## 2019-05-08 DIAGNOSIS — I10 Essential (primary) hypertension: Secondary | ICD-10-CM

## 2019-05-12 DIAGNOSIS — M48061 Spinal stenosis, lumbar region without neurogenic claudication: Secondary | ICD-10-CM | POA: Diagnosis not present

## 2019-05-16 ENCOUNTER — Other Ambulatory Visit: Payer: Self-pay | Admitting: Anesthesiology

## 2019-05-16 DIAGNOSIS — M48061 Spinal stenosis, lumbar region without neurogenic claudication: Secondary | ICD-10-CM

## 2019-05-25 ENCOUNTER — Telehealth (HOSPITAL_COMMUNITY): Payer: Self-pay

## 2019-05-25 NOTE — Telephone Encounter (Signed)

## 2019-05-26 ENCOUNTER — Ambulatory Visit (INDEPENDENT_AMBULATORY_CARE_PROVIDER_SITE_OTHER): Payer: Medicare Other | Admitting: Vascular Surgery

## 2019-05-26 ENCOUNTER — Encounter: Payer: Self-pay | Admitting: Vascular Surgery

## 2019-05-26 ENCOUNTER — Other Ambulatory Visit: Payer: Self-pay

## 2019-05-26 VITALS — BP 134/69 | HR 71 | Temp 97.7°F | Resp 20 | Ht 63.0 in | Wt 170.0 lb

## 2019-05-26 DIAGNOSIS — I6523 Occlusion and stenosis of bilateral carotid arteries: Secondary | ICD-10-CM

## 2019-05-26 NOTE — Progress Notes (Signed)
[facility] [place]  Patient name: Margaret Rangel MRN: [id number] DOB: 1946/02/02 Sex: female  REASON FOR CONSULT: Discuss carotid disease  HPI: Margaret Rangel is a 74 y.o. female, who is here today for discussion of extracranial vascular occlusive disease.  She is here today with her daughter.  She had a stroke in March 2020.  Her daughter is here to help with history.  The patient does appear to be somewhat confused.  She thought that she was here to talk about back pain.  She has had evaluation which showed MRI showing a subacute left brain stroke.  CT angiogram showed 60% right carotid stenosis with question of some outpouching in her right common carotid artery.  Left carotid is approximately 40% stenosis.  Her daughter reports that her difficulty following the stroke was with speech and also with some difficult to control her right leg mainly from her knee down.  It did change her gait.  Past Medical History:  Diagnosis Date  . Abdominal pain   . Anal fissure   . Arthritis   . Asthma   . Carotid artery occlusion   . Chronic back pain   . Colon adenoma 2009, 2012   Colonoscopy  . Diverticulosis of colon (without mention of hemorrhage) 2009   Colonoscopy  . DM (diabetes mellitus) ([place])    off medicines for diabetes 12-18-13  . GERD (gastroesophageal reflux disease)   . Hemorrhoids   . Hernia   . HTN (hypertension)   . Hyperlipidemia    low  . Irritable bowel syndrome (IBS)   . Low blood potassium   . Obese   . Stroke ([place])    x2  . Ulcer     Family History  Problem Relation Age of Onset  . Prostate cancer Brother   . Heart disease Mother   . Anuerysm Father   . Lung cancer Brother   . Colon cancer Brother   . Healthy Child   . Rectal cancer Neg Hx   . Stomach cancer Neg Hx   . Esophageal cancer Neg Hx     SOCIAL HISTORY: Social History   Socioeconomic History  . Marital status: Single    Spouse  name: Not on file  . Number of children: 3  . Years of education: Not on file  . Highest education level: Not on file  Occupational History  . Occupation: UNEMPLOYED    Employer: UNEMPLOYED  Tobacco Use  . Smoking status: Former Smoker    Packs/day: 0.25    Years: 39.00    Pack years: 9.75    Types: Cigarettes    Start date: 04/16/1976    Quit date: 02/13/2016    Years since quitting: 3.2  . Smokeless tobacco: Never Used  . Tobacco comment: 4-5 cigs per day for 39 years  Substance and Sexual Activity  . Alcohol use: Yes    Alcohol/week: 21.0 standard drinks    Types: 21 Cans of beer per week    Comment: quit 2 years ago  . Drug use: Yes    Types: Marijuana    Comment: using for depression// last used this morning 4am  . Sexual activity: Not Currently  Other Topics Concern  . Not on file  Social History Narrative   Pt is single    Has 3 children   Right handed   Drinks coffee daily, tea none, no soda   Social Determinants of Health   Financial Resource Strain:   .  Difficulty of Paying Living Expenses: Not on file  Food Insecurity:   . Worried About Charity fundraiser in the Last Year: Not on file  . Ran Out of Food in the Last Year: Not on file  Transportation Needs:   . Lack of Transportation (Medical): Not on file  . Lack of Transportation (Non-Medical): Not on file  Physical Activity:   . Days of Exercise per Week: Not on file  . Minutes of Exercise per Session: Not on file  Stress:   . Feeling of Stress : Not on file  Social Connections:   . Frequency of Communication with Friends and Family: Not on file  . Frequency of Social Gatherings with Friends and Family: Not on file  . Attends Religious Services: Not on file  . Active Member of Clubs or Organizations: Not on file  . Attends Archivist Meetings: Not on file  . Marital Status: Not on file  Intimate Partner Violence:   . Fear of Current or Ex-Partner: Not on file  . Emotionally Abused: Not  on file  . Physically Abused: Not on file  . Sexually Abused: Not on file    Allergies  Allergen Reactions  . Codeine Itching  . Penicillins Itching    Has patient had a PCN reaction causing immediate rash, facial/tongue/throat swelling, SOB or lightheadedness with hypotension:No Has patient had a PCN reaction causing severe rash involving mucus membranes or skin necrosis:No Has patient had a PCN reaction that required hopititalization:No Has patient had a PCN reaction occurring within the last 10 years:No If all of the above answers are "NO", then may proceed with Cephalosporin use.     Current Outpatient Medications  Medication Sig Dispense Refill  . albuterol (PROVENTIL HFA;VENTOLIN HFA) 108 (90 Base) MCG/ACT inhaler Inhale 2 puffs into the lungs every 6 (six) hours as needed for wheezing or shortness of breath. 1 Inhaler 2  . aspirin EC 325 MG EC tablet Take 1 tablet (325 mg total) by mouth daily. 90 tablet 1  . Blood Glucose Calibration (ONETOUCH VERIO) High SOLN 1 each by In Vitro route as directed. 1 each 6  . Blood Glucose Calibration (ONETOUCH VERIO) SOLN 1 Bottle by In Vitro route as directed. 1 each 6  . Blood Glucose Monitoring Suppl (ONETOUCH VERIO) w/Device KIT 1 kit by Does not apply route 3 (three) times daily. ICD-10 code: E11.9 1 kit 0  . carvedilol (COREG) 3.125 MG tablet Take 1 tablet by mouth twice daily 180 tablet 0  . clopidogrel (PLAVIX) 75 MG tablet Take 1 tablet (75 mg total) by mouth daily. 30 tablet 2  . diclofenac Sodium (VOLTAREN) 1 % GEL Apply 1 application topically 3 (three) times daily as needed (back pain).    Marland Kitchen docusate sodium (COLACE) 100 MG capsule Take 1 capsule (100 mg total) by mouth every 12 (twelve) hours. (Patient taking differently: Take 100 mg by mouth 2 (two) times daily as needed (constipation). ) 60 capsule 0  . ezetimibe (ZETIA) 10 MG tablet Take 1 tablet (10 mg total) by mouth daily. 90 tablet 3  . Ferrous Sulfate (IRON) 325 (65 Fe) MG  TABS Take 1 tablet (325 mg total) by mouth 2 (two) times daily with a meal. 90 tablet 0  . gabapentin (NEURONTIN) 100 MG capsule Take 1 capsule (100 mg total) by mouth at bedtime. 30 capsule 3  . glimepiride (AMARYL) 2 MG tablet Take 1 tablet by mouth once daily with breakfast (Patient taking differently: Take 2  mg by mouth daily with breakfast. ) 90 tablet 3  . glucose blood (ONETOUCH VERIO) test strip Use as instructed to test glucose level three times daily. ICD-10 code: E11.9 100 each 12  . Lancet Devices (ONE TOUCH DELICA LANCING DEV) MISC 1 Device by Does not apply route as directed. ICD-10 code: E11.9 1 each 0  . naproxen sodium (ALEVE) 220 MG tablet Take 440 mg by mouth 2 (two) times daily as needed (pain).    . pantoprazole (PROTONIX) 40 MG tablet Take 1 tablet (40 mg total) by mouth daily. 90 tablet 3  . polyethylene glycol (MIRALAX / GLYCOLAX) 17 g packet Take 17 g by mouth daily. 14 each 0  . rosuvastatin (CRESTOR) 40 MG tablet Take 1 tablet (40 mg total) by mouth daily. 90 tablet 3  . traMADol (ULTRAM) 50 MG tablet TAKE 1 TABLET BY MOUTH EVERY 12 HOURS AS NEEDED FOR MODERATE PAIN (Patient taking differently: Take 50 mg by mouth every 12 (twelve) hours as needed for moderate pain. ) 30 tablet 0   No current facility-administered medications for this visit.    REVIEW OF SYSTEMS:  '[X]'  denotes positive finding, '[ ]'  denotes negative finding Cardiac  Comments:  Chest pain or chest pressure:    Shortness of breath upon exertion:    Short of breath when lying flat:    Irregular heart rhythm:        Vascular    Pain in calf, thigh, or hip brought on by ambulation:    Pain in feet at night that wakes you up from your sleep:     Blood clot in your veins:    Leg swelling:         Pulmonary    Oxygen at home:    Productive cough:     Wheezing:         Neurologic    Sudden weakness in arms or legs:  x   Sudden numbness in arms or legs:  x   Sudden onset of difficulty speaking or  slurred speech: x   Temporary loss of vision in one eye:     Problems with dizziness:         Gastrointestinal    Blood in stool:     Vomited blood:         Genitourinary    Burning when urinating:     Blood in urine:        Psychiatric    Major depression:         Hematologic    Bleeding problems:    Problems with blood clotting too easily:        Skin    Rashes or ulcers:        Constitutional    Fever or chills:      PHYSICAL EXAM: Vitals:   05/26/19 1418 05/26/19 1421  BP: 127/62 134/69  Pulse: 71   Resp: 20   Temp: 97.7 F (36.5 C)   SpO2: 99%   Weight: 170 lb (77.1 kg)   Height: '5\' 3"'  (1.6 m)     GENERAL: The patient is a well-nourished female, in no acute distress. The vital signs are documented above. CARDIOVASCULAR: Carotid arteries without bruits bilaterally.  2+ radial pulses bilaterally.  Heart without murmur. PULMONARY: There is good air exchange  ABDOMEN: Soft and non-tender  MUSCULOSKELETAL: There are no major deformities or cyanosis. NEUROLOGIC: No focal weakness or paresthesias are detected. SKIN: There are no ulcers or rashes noted. PSYCHIATRIC: The  patient has a normal affect.  DATA:  CT and MRI reviewed.  She does have moderate bilateral carotid stenosis.  There is some irregularity in her common carotid artery on the right with some mild to moderate outpouching.  No evidence of false aneurysm or saccular aneurysm  MEDICAL ISSUES: I discussed these findings with the patient and her daughter.  I explained that she does not have any indication for surgery with a 40% stenosis on the left carotid where she has had her stroke.  She does have a 60% right carotid stenosis.  The patient is adamant that she will not have surgery and I reassured her that there is no recommendation for that currently.  I have suggested that we see her again in 1 year with follow-up carotid duplex.  She knows to report immediately to the emergency room should she have any  new neurologic deficit   Rosetta Posner, MD [facility] Tel [phone] Pager [phone]

## 2019-05-27 ENCOUNTER — Other Ambulatory Visit: Payer: Self-pay | Admitting: *Deleted

## 2019-05-27 DIAGNOSIS — I6523 Occlusion and stenosis of bilateral carotid arteries: Secondary | ICD-10-CM

## 2019-06-01 ENCOUNTER — Telehealth: Payer: Self-pay | Admitting: Cardiology

## 2019-06-01 MED ORDER — CLOPIDOGREL BISULFATE 75 MG PO TABS: 75.0000 mg | ORAL_TABLET | Freq: Every day | ORAL | 0 refills | Status: AC

## 2019-06-01 NOTE — Telephone Encounter (Signed)
Pt's medication was sent to pt's pharmacy as requested. Confirmation received.  °

## 2019-06-01 NOTE — Telephone Encounter (Signed)
*  STAT* If patient is at the pharmacy, call can be transferred to refill team.   1. Which medications need to be refilled? (please list name of each medication and dose if known) Clopidogrel 75 mg 2. Which pharmacy/location (including street and city if local pharmacy) is medication to be sent to? [place]  3. Do they need a 30 day or 90 day supply? [place]

## 2019-06-14 ENCOUNTER — Other Ambulatory Visit: Payer: Self-pay | Admitting: Family Medicine

## 2019-06-14 ENCOUNTER — Encounter: Payer: Self-pay | Admitting: Family Medicine

## 2019-06-14 DIAGNOSIS — E0800 Diabetes mellitus due to underlying condition with hyperosmolarity without nonketotic hyperglycemic-hyperosmolar coma (NKHHC): Secondary | ICD-10-CM

## 2019-06-14 DIAGNOSIS — I10 Essential (primary) hypertension: Secondary | ICD-10-CM

## 2019-06-14 MED ORDER — LOSARTAN POTASSIUM 25 MG PO TABS: 25.0000 mg | ORAL_TABLET | Freq: Every day | ORAL | 3 refills | Status: DC

## 2019-06-17 NOTE — Telephone Encounter (Signed)
Daughter calls nurse line in regards to new blood pressure medication. Per daughter, she is unaware of mothers blood pressure being high at recent visits or hospital stays. Per daughter, she takes her current BP regime as prescribed. Since there is confusion, scheduled them to see you 03/10.

## 2019-06-22 ENCOUNTER — Other Ambulatory Visit: Payer: Self-pay

## 2019-06-22 ENCOUNTER — Ambulatory Visit
Admission: RE | Admit: 2019-06-22 | Discharge: 2019-06-22 | Disposition: A | Discharge status: Home / Self Care | Payer: Medicare Other | Source: Ambulatory Visit | Attending: Anesthesiology | Admitting: Anesthesiology

## 2019-06-22 DIAGNOSIS — M48061 Spinal stenosis, lumbar region without neurogenic claudication: Secondary | ICD-10-CM

## 2019-06-24 ENCOUNTER — Ambulatory Visit: Payer: Medicare Other | Admitting: Family Medicine

## 2019-06-24 DIAGNOSIS — M48061 Spinal stenosis, lumbar region without neurogenic claudication: Secondary | ICD-10-CM | POA: Diagnosis not present

## 2019-06-29 DIAGNOSIS — M48061 Spinal stenosis, lumbar region without neurogenic claudication: Secondary | ICD-10-CM | POA: Diagnosis not present

## 2019-07-16 ENCOUNTER — Telehealth: Payer: Self-pay | Admitting: Podiatry

## 2019-07-16 NOTE — Telephone Encounter (Signed)
This is Lareen's daughter again. Can you give me a call back, I'm trying to figure out how to get the soreness out of her foot. She has an appointment with Dr. Prudence Davidson on 06/16. I can be reached at [phone]. Thank you.

## 2019-07-16 NOTE — Telephone Encounter (Signed)
I spoke pt's dtr and she states pt has ingrown toenails in her big toes and having a lot of pain. I told the dtr soak in 1/4 cup epsom salt and 1 quart of warm water daily and apply neosporin around the toenail to keep area supple which may decreas the pain and I transferred to scheduler.

## 2019-08-04 ENCOUNTER — Other Ambulatory Visit: Payer: Self-pay

## 2019-08-04 ENCOUNTER — Ambulatory Visit: Payer: Medicare Other

## 2019-08-04 ENCOUNTER — Ambulatory Visit: Payer: Medicare Other | Admitting: Cardiology

## 2019-08-04 NOTE — Progress Notes (Deleted)
Electrophysiology Office Note   Date:  08/04/2019   ID:  Margaret, Rangel 1946-03-06, MRN [id number]  PCP:  Daisy Floro, DO  Primary Electrophysiologist:  Constance Haw, MD    No chief complaint on file.    History of Present Illness: Margaret Rangel is a 74 y.o. female who presents today for electrophysiology evaluation.   She has a history of diabetes, hypertension, hyperlipidemia, and stroke 2. On 03/07/16, she presented to the emergency room with chest pain associated with shortness of breath, nausea, diaphoresis, and vomiting. She says that the pain occurred when she was lying in bed. The pain radiated up to her left arm. Her EKG the EMS showed she had diffuse ST depressions which were more pronounced than previous EKGs. Her pain resolved after full dose aspirin and nitroglycerin. The patient was asymptomatic in the emergency room. She quit smoking 3 months prior to her emergency room visit. The patient signed out of the emergency room at that time AMA.   Today, denies symptoms of palpitations, chest pain, shortness of breath, orthopnea, PND, lower extremity edema, claudication, dizziness, presyncope, syncope, bleeding, or neurologic sequela. The patient is tolerating medications without difficulties.  She was admitted to the hospital 03/03/2019 with cryptogenic stroke.  She was found to have significant carotid disease and had follow-up with vascular surgery.***    Past Medical History:  Diagnosis Date  . Abdominal pain   . Anal fissure   . Arthritis   . Asthma   . Carotid artery occlusion   . Chronic back pain   . Colon adenoma 2009, 2012   Colonoscopy  . Diverticulosis of colon (without mention of hemorrhage) 2009   Colonoscopy  . DM (diabetes mellitus) ([place])    off medicines for diabetes 12-18-13  . GERD (gastroesophageal reflux disease)   . Hemorrhoids   . Hernia   . HTN (hypertension)   . Hyperlipidemia    low  . Irritable bowel syndrome (IBS)    . Low blood potassium   . Obese   . Stroke ([place])    x2  . Ulcer    Past Surgical History:  Procedure Laterality Date  . BUBBLE STUDY  03/03/2019   Procedure: BUBBLE STUDY;  Surgeon: Pixie Casino, MD;  Location: [facility] ENDOSCOPY;  Service: Cardiovascular;;  . COLONOSCOPY    . FLEXIBLE SIGMOIDOSCOPY N/A 07/14/2012   Procedure: FLEXIBLE SIGMOIDOSCOPY;  Surgeon: Inda Castle, MD;  Location: [facility] ENDOSCOPY;  Service: Endoscopy;  Laterality: N/A;  . HEMORRHOID BANDING N/A 07/14/2012   Procedure: HEMORRHOID BANDING;  Surgeon: Inda Castle, MD;  Location: [facility] ENDOSCOPY;  Service: Endoscopy;  Laterality: N/A;  . PARTIAL HYSTERECTOMY  1971  . POLYPECTOMY    . TEE WITHOUT CARDIOVERSION N/A 03/03/2019   Procedure: TRANSESOPHAGEAL ECHOCARDIOGRAM (TEE);  Surgeon: Pixie Casino, MD;  Location: [facility] ENDOSCOPY;  Service: Cardiovascular;  Laterality: N/A;  . TONSILLECTOMY       Current Outpatient Medications  Medication Sig Dispense Refill  . albuterol (PROVENTIL HFA;VENTOLIN HFA) 108 (90 Base) MCG/ACT inhaler Inhale 2 puffs into the lungs every 6 (six) hours as needed for wheezing or shortness of breath. 1 Inhaler 2  . aspirin EC 325 MG EC tablet Take 1 tablet (325 mg total) by mouth daily. 90 tablet 1  . Blood Glucose Calibration (ONETOUCH VERIO) High SOLN 1 each by In Vitro route as directed. 1 each 6  . Blood Glucose Calibration (ONETOUCH VERIO) SOLN 1 Bottle by In Vitro route as  directed. 1 each 6  . Blood Glucose Monitoring Suppl (ONETOUCH VERIO) w/Device KIT 1 kit by Does not apply route 3 (three) times daily. ICD-10 code: E11.9 1 kit 0  . carvedilol (COREG) 3.125 MG tablet Take 1 tablet by mouth twice daily 180 tablet 0  . clopidogrel (PLAVIX) 75 MG tablet Take 1 tablet (75 mg total) by mouth daily. Please keep upcoming appt in April with Dr. Curt Bears before anymore refills. Thank you 90 tablet 0  . diclofenac Sodium (VOLTAREN) 1 % GEL Apply 1 application topically 3 (three) times daily as  needed (back pain).    Marland Kitchen docusate sodium (COLACE) 100 MG capsule Take 1 capsule (100 mg total) by mouth every 12 (twelve) hours. (Patient taking differently: Take 100 mg by mouth 2 (two) times daily as needed (constipation). ) 60 capsule 0  . ezetimibe (ZETIA) 10 MG tablet Take 1 tablet (10 mg total) by mouth daily. 90 tablet 3  . Ferrous Sulfate (IRON) 325 (65 Fe) MG TABS Take 1 tablet (325 mg total) by mouth 2 (two) times daily with a meal. 90 tablet 0  . gabapentin (NEURONTIN) 100 MG capsule Take 1 capsule (100 mg total) by mouth at bedtime. 30 capsule 3  . glimepiride (AMARYL) 2 MG tablet Take 1 tablet by mouth once daily with breakfast (Patient taking differently: Take 2 mg by mouth daily with breakfast. ) 90 tablet 3  . glucose blood (ONETOUCH VERIO) test strip Use as instructed to test glucose level three times daily. ICD-10 code: E11.9 100 each 12  . Lancet Devices (ONE TOUCH DELICA LANCING DEV) MISC 1 Device by Does not apply route as directed. ICD-10 code: E11.9 1 each 0  . losartan (COZAAR) 25 MG tablet Take 1 tablet (25 mg total) by mouth at bedtime. 90 tablet 3  . naproxen sodium (ALEVE) 220 MG tablet Take 440 mg by mouth 2 (two) times daily as needed (pain).    . pantoprazole (PROTONIX) 40 MG tablet Take 1 tablet (40 mg total) by mouth daily. 90 tablet 3  . polyethylene glycol (MIRALAX / GLYCOLAX) 17 g packet Take 17 g by mouth daily. 14 each 0  . rosuvastatin (CRESTOR) 40 MG tablet Take 1 tablet (40 mg total) by mouth daily. 90 tablet 3  . traMADol (ULTRAM) 50 MG tablet TAKE 1 TABLET BY MOUTH EVERY 12 HOURS AS NEEDED FOR MODERATE PAIN (Patient taking differently: Take 50 mg by mouth every 12 (twelve) hours as needed for moderate pain. ) 30 tablet 0   No current facility-administered medications for this visit.    Allergies:   Codeine and Penicillins   Social History:  The patient  reports that she quit smoking about 3 years ago. Her smoking use included cigarettes. She started  smoking about 43 years ago. She has a 9.75 pack-year smoking history. She has never used smokeless tobacco. She reports current alcohol use of about 21.0 standard drinks of alcohol per week. She reports current drug use. Drug: Marijuana.   Family History:  The patient's family history includes Anuerysm in her father; Colon cancer in her brother; Healthy in her child; Heart disease in her mother; Lung cancer in her brother; Prostate cancer in her brother.    ROS:  Please see the history of present illness.   Otherwise, review of systems is positive for ***.   All other systems are reviewed and negative.   PHYSICAL EXAM: VS:  There were no vitals taken for this visit. , BMI There is no  height or weight on file to calculate BMI. GEN: Well nourished, well developed, in no acute distress  HEENT: normal  Neck: no JVD, carotid bruits, or masses Cardiac: ***RRR; no murmurs, rubs, or gallops,no edema  Respiratory:  clear to auscultation bilaterally, normal work of breathing GI: soft, nontender, nondistended, + BS MS: no deformity or atrophy  Skin: warm and dry Neuro:  Strength and sensation are intact Psych: euthymic mood, full affect  EKG:  EKG {ACTION; IS/IS OAC:[id number]} ordered today. Personal review of the ekg ordered *** shows ***   Recent Labs: 09/02/2018: ALT 29 02/27/2019: BUN 16; Creatinine, Ser 0.86; Hemoglobin 12.6; Platelets 279; Potassium 3.5; Sodium 140    Lipid Panel     Component Value Date/Time   CHOL 276 (H) 09/03/2018 1549   TRIG 378 (H) 09/03/2018 1549   HDL 41 09/03/2018 1549   CHOLHDL 6.7 (H) 09/03/2018 1549   CHOLHDL 6.3 06/15/2010 0530   VLDL UNABLE TO CALCULATE IF TRIGLYCERIDE OVER 400 mg/dL 06/15/2010 0530   LDLCALC 159 (H) 09/03/2018 1549     Wt Readings from Last 3 Encounters:  05/26/19 170 lb (77.1 kg)  03/10/19 170 lb (77.1 kg)  02/27/19 157 lb 6.5 oz (71.4 kg)      Other studies Reviewed: Additional studies/ records that were reviewed today  include: TEE 03/03/19 Review of the above records today demonstrates:  1. Left ventricular ejection fraction, by visual estimation, is 60 to  65%. The left ventricle has normal function. There is mildly increased  left ventricular hypertrophy.  2. Global right ventricle has normal systolic function.The right  ventricular size is normal. No increase in right ventricular wall  thickness.  3. Left atrial size was normal.  4. Right atrial size was normal.  5. The mitral valve is grossly normal. Trace mitral valve regurgitation.  6. The tricuspid valve is grossly normal. Tricuspid valve regurgitation  is not demonstrated.  7. The aortic valve is tricuspid. Aortic valve regurgitation is mild.  Mild aortic valve sclerosis without stenosis.  8. The pulmonic valve was grossly normal. Pulmonic valve regurgitation is  not visualized.  9. No intracardiac thrombi or masses were visualized.   Myoview 06/13/17  Nuclear stress EF: 70%.  The left ventricular ejection fraction is hyperdynamic (>65%).  No T wave inversion was noted during stress.  There was no ST segment deviation noted during stress.   Normal perfusion. LVEF 70% with normal wall motion. This is a low risk study.  ASSESSMENT AND PLAN:  1.  Chest pain: Low risk Myoview.  No further episodes of chest pain.  Continue with current management.  ***  2. Hypertension: ***  3. Hyperlipidemia: Currently on Crestor 40 mg.  Last LDL was quite elevated.  We will check a fasting lipid panel as well as LFTs.  4.  Cryptogenic stroke:  Current medicines are reviewed at length with the patient today.   The patient does not have concerns regarding her medicines.  The following changes were made today: ***  Labs/ tests ordered today include:  No orders of the defined types were placed in this encounter.    Disposition:   FU with Will Camnitz *** year  Signed, Will Meredith Leeds, MD  08/04/2019 9:40 AM     [facility]  [address] [place] [address] [phone] (office) [phone] (fax)

## 2019-08-06 NOTE — Progress Notes (Addendum)
 NEUROLOGY FOLLOW UP OFFICE NOTE  Margaret Rangel [id number]  HISTORY OF PRESENT ILLNESS: Margaret Rangel is a 73year oldright-handed black woman with asthma, hypertension, hyperlipidemia, IBS, type 2 diabetes mellitus, chronic back pain and history of strokeswho follows up for headache and stroke.  She is accompanied by her daughter who supplements history. Vascular surgery note reviewed.  UPDATE: Current medications: ASA 81mg; Plavix; Crestor 40mg,Zetia, Coreg, glimepiride, tramadol  In November, she endorsed episodes of severe pressure-like headache on top of head and left side of face associated with dizziness and unsteady gait lasting a few seconds.  It occurs 1 to 2 times a day.  She was started on gabapentin 100mg but it made her feel funny so she stopped.  However, she has only had 2 or 3 since November.  She followed up with vascular surgery who determined no indication for surgical intervention for left ICA stenosis of 40%.  HISTORY: In May 2020, she developed a left-sided headache with associated slurred speech. There was no associated visual disturbance, unsteady gait, dizziness, or unilateral numbness or weakness. She didn't seek immediate medical attention. Over the next few days, symptoms gradually improved. She had a virtual visit with her PCP 5 days later, on 09/02/18, who told her to go to the ED. CT of head personally reviewed showed hypodensity in the left frontal lobe suspicious for acute infarct as well as age-indeterminate strokes in the bilateral basal ganglia. MRI of brain without contrast showed multiple small acute strokes in the left frontal region as well as chronic small vessel ischemic changes and remote lacunar infarcts involving both basal ganglia, thalami and pons. Hospital admission was recommended but patient and her family declined. Labs from 09/03/18 showed lipid panel with total cholesterol 276, TG 378, HDL 41 and LDL 159. Hgb A1c was 7.4%. She  later had an outpatient carotid doppler on 09/17/18 which demonstrated 40-59% stenosis in the right internal carotid artery and 1-39% stenosis in the left internal carotid artery as well as over 50% stenosis in the right ECA, left CCA and left ECA. 2D echocardiogram from 09/19/18 demonstrated LVEF 60-65% with no atrial level shunt detected by color flow Doppler. Cardiology is now discussing Linq monitoring.  She had not been on antiplatelet therapy due to GI bleed in 2014.Since this recent stroke, she has started ASA 81mg daily. She was restarted on Crestor (previously stopped it).  She started having the headaches daily in October 2020.  She was admitted to [facility] on 02/27/2019 after presenting with dizziness and headache.  She did not receive IV tPA due to late presentation.  MRI of brain without contrast showed late subacute infarct in the left MCA/PCA watershed zone at the medial left parietooccipital junction, likely several weeks old.  CTA of head and neck showed bilateral ICA atherosclerotic disease with 60% stenosis at right ICA bulb, 40% stenosis at left ICA bulb, 50% stenosis f left CCA, right VA 50% stenosis and left VA 70% stenosis.  2D echo and TEE showed EF 60-65% with no cardiac source of emboli.  She was discharged on dual antiplatelet therapy (ASA 325mg and Plavix 75mg) for 3 months, followed by ASA 325mg alone.  She has prior history of stroke with stroke risk factors including hypertension, hyperlipidemia, type 2 diabetes mellitus, former cigarette smoker.  PAST MEDICAL HISTORY: Past Medical History:  Diagnosis Date  . Abdominal pain   . Anal fissure   . Arthritis   . Asthma   . Carotid artery occlusion   .   Chronic back pain   . Colon adenoma 2009, 2012   Colonoscopy  . Diverticulosis of colon (without mention of hemorrhage) 2009   Colonoscopy  . DM (diabetes mellitus) (HCC)    off medicines for diabetes 12-18-13  . GERD (gastroesophageal reflux disease)   .  Hemorrhoids   . Hernia   . HTN (hypertension)   . Hyperlipidemia    low  . Irritable bowel syndrome (IBS)   . Low blood potassium   . Obese   . Stroke (HCC)    x2  . Ulcer     MEDICATIONS: Current Outpatient Medications on File Prior to Visit  Medication Sig Dispense Refill  . albuterol (PROVENTIL HFA;VENTOLIN HFA) 108 (90 Base) MCG/ACT inhaler Inhale 2 puffs into the lungs every 6 (six) hours as needed for wheezing or shortness of breath. 1 Inhaler 2  . aspirin EC 325 MG EC tablet Take 1 tablet (325 mg total) by mouth daily. 90 tablet 1  . Blood Glucose Calibration (ONETOUCH VERIO) High SOLN 1 each by In Vitro route as directed. 1 each 6  . Blood Glucose Calibration (ONETOUCH VERIO) SOLN 1 Bottle by In Vitro route as directed. 1 each 6  . Blood Glucose Monitoring Suppl (ONETOUCH VERIO) w/Device KIT 1 kit by Does not apply route 3 (three) times daily. ICD-10 code: E11.9 1 kit 0  . carvedilol (COREG) 3.125 MG tablet Take 1 tablet by mouth twice daily 180 tablet 0  . clopidogrel (PLAVIX) 75 MG tablet Take 1 tablet (75 mg total) by mouth daily. Please keep upcoming appt in April with Dr. Camnitz before anymore refills. Thank you 90 tablet 0  . diclofenac Sodium (VOLTAREN) 1 % GEL Apply 1 application topically 3 (three) times daily as needed (back pain).    . docusate sodium (COLACE) 100 MG capsule Take 1 capsule (100 mg total) by mouth every 12 (twelve) hours. (Patient taking differently: Take 100 mg by mouth 2 (two) times daily as needed (constipation). ) 60 capsule 0  . ezetimibe (ZETIA) 10 MG tablet Take 1 tablet (10 mg total) by mouth daily. 90 tablet 3  . Ferrous Sulfate (IRON) 325 (65 Fe) MG TABS Take 1 tablet (325 mg total) by mouth 2 (two) times daily with a meal. 90 tablet 0  . gabapentin (NEURONTIN) 100 MG capsule Take 1 capsule (100 mg total) by mouth at bedtime. 30 capsule 3  . glimepiride (AMARYL) 2 MG tablet Take 1 tablet by mouth once daily with breakfast (Patient taking  differently: Take 2 mg by mouth daily with breakfast. ) 90 tablet 3  . glucose blood (ONETOUCH VERIO) test strip Use as instructed to test glucose level three times daily. ICD-10 code: E11.9 100 each 12  . Lancet Devices (ONE TOUCH DELICA LANCING DEV) MISC 1 Device by Does not apply route as directed. ICD-10 code: E11.9 1 each 0  . losartan (COZAAR) 25 MG tablet Take 1 tablet (25 mg total) by mouth at bedtime. 90 tablet 3  . naproxen sodium (ALEVE) 220 MG tablet Take 440 mg by mouth 2 (two) times daily as needed (pain).    . pantoprazole (PROTONIX) 40 MG tablet Take 1 tablet (40 mg total) by mouth daily. 90 tablet 3  . polyethylene glycol (MIRALAX / GLYCOLAX) 17 g packet Take 17 g by mouth daily. 14 each 0  . rosuvastatin (CRESTOR) 40 MG tablet Take 1 tablet (40 mg total) by mouth daily. 90 tablet 3  . traMADol (ULTRAM) 50 MG tablet TAKE 1 TABLET   BY MOUTH EVERY 12 HOURS AS NEEDED FOR MODERATE PAIN (Patient taking differently: Take 50 mg by mouth every 12 (twelve) hours as needed for moderate pain. ) 30 tablet 0   No current facility-administered medications on file prior to visit.    ALLERGIES: Allergies  Allergen Reactions  . Codeine Itching  . Penicillins Itching    Has patient had a PCN reaction causing immediate rash, facial/tongue/throat swelling, SOB or lightheadedness with hypotension:No Has patient had a PCN reaction causing severe rash involving mucus membranes or skin necrosis:No Has patient had a PCN reaction that required hopititalization:No Has patient had a PCN reaction occurring within the last 10 years:No If all of the above answers are "NO", then may proceed with Cephalosporin use.     FAMILY HISTORY: Family History  Problem Relation Age of Onset  . Prostate cancer Brother   . Heart disease Mother   . Anuerysm Father   . Lung cancer Brother   . Colon cancer Brother   . Healthy Child   . Rectal cancer Neg Hx   . Stomach cancer Neg Hx   . Esophageal cancer Neg Hx       SOCIAL HISTORY: Social History   Socioeconomic History  . Marital status: Single    Spouse name: Not on file  . Number of children: 3  . Years of education: Not on file  . Highest education level: Not on file  Occupational History  . Occupation: UNEMPLOYED    Employer: UNEMPLOYED  Tobacco Use  . Smoking status: Former Smoker    Packs/day: 0.25    Years: 39.00    Pack years: 9.75    Types: Cigarettes    Start date: 04/16/1976    Quit date: 02/13/2016    Years since quitting: 3.4  . Smokeless tobacco: Never Used  . Tobacco comment: 4-5 cigs per day for 39 years  Substance and Sexual Activity  . Alcohol use: Yes    Alcohol/week: 21.0 standard drinks    Types: 21 Cans of beer per week    Comment: quit 2 years ago  . Drug use: Yes    Types: Marijuana    Comment: using for depression// last used this morning 4am  . Sexual activity: Not Currently  Other Topics Concern  . Not on file  Social History Narrative   Pt is single    Has 3 children   Right handed   Drinks coffee daily, tea none, no soda   Social Determinants of Health   Financial Resource Strain:   . Difficulty of Paying Living Expenses:   Food Insecurity:   . Worried About Running Out of Food in the Last Year:   . Ran Out of Food in the Last Year:   Transportation Needs:   . Lack of Transportation (Medical):   . Lack of Transportation (Non-Medical):   Physical Activity:   . Days of Exercise per Week:   . Minutes of Exercise per Session:   Stress:   . Feeling of Stress :   Social Connections:   . Frequency of Communication with Friends and Family:   . Frequency of Social Gatherings with Friends and Family:   . Attends Religious Services:   . Active Member of Clubs or Organizations:   . Attends Club or Organization Meetings:   . Marital Status:   Intimate Partner Violence:   . Fear of Current or Ex-Partner:   . Emotionally Abused:   . Physically Abused:   . Sexually Abused:       PHYSICAL  EXAM: Vitals:   08/07/19 1443  BP: (!) 156/68  Pulse: 71  SpO2: 99%   General: No acute distress.  Patient appears well-groomed.   Head:  Normocephalic/atraumatic Eyes:  Fundi examined but not visualized Neck: supple, no paraspinal tenderness, full range of motion Heart:  Regular rate and rhythm Lungs:  Clear to auscultation bilaterally Back: No paraspinal tenderness Neurological Exam: alert and oriented to person, place, and time. Attention span and concentration intact, recent and remote memory intact, fund of knowledge intact.  Speech fluent and slightly dysarthric, language intact.  CN II-XII intact. Bulk and tone normal, muscle strength 5-/5 throughout.  Sensation to light touch  intact.  Deep tendon reflexes 1+ throughout except absent in the ankles. Finger to nose  testing intact.  Gait:  Deferred (in wheelchair)  IMPRESSION: 1.  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  Paroxysmal left sided headaches, not intractable.  Unclear headache syndrome.  Questionable paroxysmal hemicrania or basilar migraine.  Stable 3.  Bilateral ICA stenosis (40% on left symptomatic side; 60% right asymptomatic side) 4.  Hyperlipidemia 5.  Type 2 diabetes mellitus 6.  Hypertension  PLAN: 1.  ASA 325mg daily for secondary stroke prevention.  She remains on Plavix as well.  Unless there is any objection by her other specialists, I believe that Plavix may now be discontinued. 2. Crestor and Zetia (LDL goal less than 70) 3.  Optimize blood pressure and glycemic control.  She was prescribed losartan but hasn't started it.  I recommended to start it. 4.  Follow up in 6 months.  Adam Jaffe, DO  CC:  Hannah Anderson, DO  Will Mcmullen Camnitz, MD  Todd Early, MD  ADDENDUM:  As per Dr. Camnitz, there is no cardiac indication for Plavix.  I will have her discontinue Plavix and continue ASA 325mg daily. Adam R. Jaffe, DO       

## 2019-08-07 ENCOUNTER — Encounter: Payer: Self-pay | Admitting: Neurology

## 2019-08-07 ENCOUNTER — Ambulatory Visit (INDEPENDENT_AMBULATORY_CARE_PROVIDER_SITE_OTHER): Payer: Medicare Other | Admitting: Neurology

## 2019-08-07 ENCOUNTER — Other Ambulatory Visit: Payer: Self-pay

## 2019-08-07 VITALS — BP 156/68 | HR 71 | Ht 62.0 in | Wt 145.0 lb

## 2019-08-07 DIAGNOSIS — I63232 Cerebral infarction due to unspecified occlusion or stenosis of left carotid arteries: Secondary | ICD-10-CM | POA: Diagnosis not present

## 2019-08-07 DIAGNOSIS — E785 Hyperlipidemia, unspecified: Secondary | ICD-10-CM

## 2019-08-07 DIAGNOSIS — I69320 Aphasia following cerebral infarction: Secondary | ICD-10-CM

## 2019-08-07 DIAGNOSIS — R519 Headache, unspecified: Secondary | ICD-10-CM

## 2019-08-07 DIAGNOSIS — E1169 Type 2 diabetes mellitus with other specified complication: Secondary | ICD-10-CM | POA: Diagnosis not present

## 2019-08-07 DIAGNOSIS — I6523 Occlusion and stenosis of bilateral carotid arteries: Secondary | ICD-10-CM

## 2019-08-07 DIAGNOSIS — E0800 Diabetes mellitus due to underlying condition with hyperosmolarity without nonketotic hyperglycemic-hyperosmolar coma (NKHHC): Secondary | ICD-10-CM

## 2019-08-07 DIAGNOSIS — I10 Essential (primary) hypertension: Secondary | ICD-10-CM | POA: Diagnosis not present

## 2019-08-07 NOTE — Patient Instructions (Addendum)
1.  Continue aspirin 325mg  daily and Plavix daily for now. 2.  Start losartan as prescribed by Dr. Ouida Sills 3.  Continue Crestor and Zetia for cholesterol 4.  Follow up in 6 months.

## 2019-08-10 ENCOUNTER — Telehealth: Payer: Self-pay

## 2019-08-10 NOTE — Telephone Encounter (Signed)
Tried calling pt, no answer. Unable to [employer]

## 2019-08-10 NOTE — Telephone Encounter (Signed)
Daughter returned call. Advised of Dr. Tomi Likens note.

## 2019-08-10 NOTE — Telephone Encounter (Signed)
-----   Message from Pieter Partridge, DO sent at 08/10/2019  1:17 PM EDT ----- Please let Ms. Guymon know that I heard from Dr. Curt Bears (cardiology) who said that she does not need to remain on clopidogrel (Plavix) from a cardiac standpoint.  Therefore, I recommend discontinuing clopidogrel and continuing just aspirin 325mg  daily for secondary stroke prevention.

## 2019-08-11 ENCOUNTER — Encounter: Payer: Self-pay | Admitting: Family Medicine

## 2019-08-11 ENCOUNTER — Ambulatory Visit (INDEPENDENT_AMBULATORY_CARE_PROVIDER_SITE_OTHER): Payer: Medicare Other | Admitting: Family Medicine

## 2019-08-11 ENCOUNTER — Other Ambulatory Visit: Payer: Self-pay

## 2019-08-11 VITALS — BP 148/68 | HR 88 | Wt 144.0 lb

## 2019-08-11 DIAGNOSIS — E1159 Type 2 diabetes mellitus with other circulatory complications: Secondary | ICD-10-CM | POA: Diagnosis not present

## 2019-08-11 DIAGNOSIS — E11621 Type 2 diabetes mellitus with foot ulcer: Secondary | ICD-10-CM

## 2019-08-11 DIAGNOSIS — L97529 Non-pressure chronic ulcer of other part of left foot with unspecified severity: Secondary | ICD-10-CM

## 2019-08-11 DIAGNOSIS — Z23 Encounter for immunization: Secondary | ICD-10-CM

## 2019-08-11 LAB — POCT GLYCOSYLATED HEMOGLOBIN (HGB A1C): HbA1c, POC (controlled diabetic range): 13.7 % — AB (ref 0.0–7.0)

## 2019-08-11 MED ORDER — METFORMIN HCL ER 500 MG PO TB24: 500.0000 mg | ORAL_TABLET | Freq: Two times a day (BID) | ORAL | 2 refills | Status: DC

## 2019-08-11 MED ORDER — EMPAGLIFLOZIN 10 MG PO TABS: 10.0000 mg | ORAL_TABLET | Freq: Every day | ORAL | 3 refills | Status: DC

## 2019-08-11 MED ORDER — DOXYCYCLINE HYCLATE 100 MG PO CAPS: 100.0000 mg | ORAL_CAPSULE | Freq: Two times a day (BID) | ORAL | 0 refills | Status: AC

## 2019-08-11 NOTE — Assessment & Plan Note (Signed)
A1c has gone up to 13.1 from 7.9 last year. Stopping glimepiride due to side effect profile at her age. Starting her on metformin 500mg  ER, as short acting has caused her GI upset in the past. Also starting Jardiance. If unable to afford Jardiance, will change this to Glipizide. Follow-up on 08/21/19 to see how she is doing on these new medications.

## 2019-08-11 NOTE — Patient Instructions (Addendum)
Thank you for coming to see me today. It was a pleasure! Today we talked about:   STOP glimepiride.   START metformin 500mg  twice a day with food. Jardiance (empaglifozin) is a new medication you should also take daily to help with your diabetes. Please let me know if it is too expensive.   I have placed a referral for an orthopedic doctor who specialize in foot ulcers. If you do not hear from them within the next few days, please call our office. Please take the antibiotic doxycycline twice daily for 14 days.   Please follow-up with me on 5/7 at 11:10am or sooner as needed.  If you have any questions or concerns, please do not hesitate to call the office at [phone].  Take Care,   Margaret Dalissa Lovin, DO

## 2019-08-11 NOTE — Progress Notes (Addendum)
    SUBJECTIVE:   CHIEF COMPLAINT / HPI:   In-grown toe nail: Endorses a 4 month history of an in-grown nail with pain in the Left big toe. She first noticed pus, blood, and swelling 2 months ago. Pain, erythema, swelling, and burning sensation has extended up the foot and into all other toes on that foot. She endroses extreme foot and toe tenderness and cannot have anyone touch it. She has had in-grown toe nails in the past and they have resolved on their own, but this one has progressively gotten worse. She called her podiatrist which recommended soaking the foot in warm, bath salts and witch hazel, and she has an appointment with podiatry scheduled for July. She has tried all of these remedies and has also tried triple antibiotic, boil-ez and orajel for the pain. Nothing has helped. She has not had a fever and is afebrile today.  She reports that her foot is very tender and is adamant that no one touches it.  Diabetes: A1c today at 13.1, was 7.9 one year ago.  Patient is only on glimepiride and has not been taking Metformin.  She believes she may have had some side effects and been taking off of this in the past but is unsure.  Endorses eating lots of ice cream and candy over the past year.  Patient has not been regularly checking her blood sugars at home.  She does also endorsing polyuria and polydipsia.  PERTINENT  PMH / PSH: Diabetes, h/o CVA,   OBJECTIVE:   BP (!) 148/68   Pulse 88   Wt 144 lb (65.3 kg)   SpO2 95%   BMI 26.34 kg/m   Physical Exam  Skin:     General: NAD, pleasant Eyes: PERRL, EOMI, no conjunctival pallor or injection Neck: Supple, no LAD Respiratory: normal work of breathing MSK: moves 4 extremities equally Derm: no rashes appreciated  ASSESSMENT/PLAN:   In-grown nail/foot infection Starting her on Doxycyline today for the infection in her foot. Referring to ortho for evaluation and imaging of her foot and removal of the in-grown toe nail. Given her A1c today  and extent of infection in her foot we are concerned for osteomyelitis. She cannot wait until her appointment with podiatry in July.   DM (diabetes mellitus) ([place]) A1c has gone up to 13.1 from 7.9 last year. Stopping glimepiride due to side effect profile at her age. Starting her on metformin 500mg  ER, as short acting has caused her GI upset in the past. Also starting Jardiance. If unable to afford Jardiance, will change this to Glipizide. Follow-up on 08/21/19 to see how she is doing on these new medications.    [place]   I have personally seen and examined this patient with the medical student and agree with the above note.  I have made my corrections in the above documentation.   Martinique Shirley, D.O. 08/13/2019, 8:40 AM PGY-3, [place]

## 2019-08-12 LAB — CBC WITH DIFFERENTIAL/PLATELET
Basophils Absolute: 0 10*3/uL (ref 0.0–0.2)
Basos: 1 %
EOS (ABSOLUTE): 0.2 10*3/uL (ref 0.0–0.4)
Eos: 3 %
Hematocrit: 38.2 % (ref 34.0–46.6)
Hemoglobin: 12.4 g/dL (ref 11.1–15.9)
Immature Grans (Abs): 0 10*3/uL (ref 0.0–0.1)
Immature Granulocytes: 0 %
Lymphocytes Absolute: 4.3 10*3/uL — ABNORMAL HIGH (ref 0.7–3.1)
Lymphs: 53 %
MCH: 26.9 pg (ref 26.6–33.0)
MCHC: 32.5 g/dL (ref 31.5–35.7)
MCV: 83 fL (ref 79–97)
Monocytes Absolute: 0.5 10*3/uL (ref 0.1–0.9)
Monocytes: 7 %
Neutrophils Absolute: 2.8 10*3/uL (ref 1.4–7.0)
Neutrophils: 36 %
Platelets: 300 10*3/uL (ref 150–450)
RBC: 4.61 x10E6/uL (ref 3.77–5.28)
RDW: 12.8 % (ref 11.7–15.4)
WBC: 8 10*3/uL (ref 3.4–10.8)

## 2019-08-12 LAB — BASIC METABOLIC PANEL
BUN/Creatinine Ratio: 9 — ABNORMAL LOW (ref 12–28)
BUN: 9 mg/dL (ref 8–27)
CO2: 23 mmol/L (ref 20–29)
Calcium: 9.5 mg/dL (ref 8.7–10.3)
Chloride: 100 mmol/L (ref 96–106)
Creatinine, Ser: 1.03 mg/dL — ABNORMAL HIGH (ref 0.57–1.00)
GFR calc Af Amer: 62 mL/min/{1.73_m2} (ref >59)
GFR calc non Af Amer: 54 mL/min/{1.73_m2} — ABNORMAL LOW (ref >59)
Glucose: 345 mg/dL — ABNORMAL HIGH (ref 65–99)
Potassium: 4.4 mmol/L (ref 3.5–5.2)
Sodium: 138 mmol/L (ref 134–144)

## 2019-08-12 LAB — LIPID PANEL
Chol/HDL Ratio: 3 ratio (ref 0.0–4.4)
Cholesterol, Total: 154 mg/dL (ref 100–199)
HDL: 52 mg/dL (ref >39)
LDL Chol Calc (NIH): 54 mg/dL (ref 0–99)
Triglycerides: 315 mg/dL — ABNORMAL HIGH (ref 0–149)
VLDL Cholesterol Cal: 48 mg/dL — ABNORMAL HIGH (ref 5–40)

## 2019-08-13 ENCOUNTER — Ambulatory Visit (INDEPENDENT_AMBULATORY_CARE_PROVIDER_SITE_OTHER): Payer: Medicare Other | Admitting: Orthopedic Surgery

## 2019-08-13 ENCOUNTER — Other Ambulatory Visit: Payer: Self-pay

## 2019-08-13 ENCOUNTER — Encounter: Payer: Self-pay | Admitting: Orthopedic Surgery

## 2019-08-13 VITALS — Ht 62.0 in | Wt 144.0 lb

## 2019-08-13 DIAGNOSIS — L03032 Cellulitis of left toe: Secondary | ICD-10-CM | POA: Diagnosis not present

## 2019-08-13 NOTE — Progress Notes (Signed)
Office Visit Note   Patient: Margaret Rangel           Date of Birth: Aug 21, 1945           MRN: [id number] Visit Date: 08/13/2019              Requested by: Zenia Resides, MD [address] [place],  [place] [address] PCP: Daisy Floro, DO  Chief Complaint  Patient presents with  . Left Foot - Open Wound    Left foot ulcer       HPI: Patient is a 74 year old woman who presents with a painful paronychial infection left great toenail.  Patient states she had purulent drainage last night complains of extreme pain no relief with pain medication.  Assessment & Plan: Visit Diagnoses:  1. Paronychia of great toe, left     Plan: Nail was excised without complication she was placed in a postoperative shoe she will start dressing changes tomorrow with antibiotic ointment and a Band-Aid.  Follow-up if there is any recurrence of the infection.  Follow-Up Instructions: Return if symptoms worsen or fail to improve.   Ortho Exam  Patient is alert, oriented, no adenopathy, well-dressed, normal affect, normal respiratory effort. Examination patient has a faintly palpable dorsalis pedis pulse she has onychomycotic ingrown left great toenail with a painful paronychial infection with swelling and ulceration around the lateral border.  After informed consent with her daughter present patient underwent a digital block with 15 cc of 1% lidocaine plain.  After adequate levels anesthesia were obtained the left great toenail was removed without complications.  Xeroform and a dry dressing was applied.  Patient was given instructions to start dressing changes tomorrow.  Patient had no purulence and no deep abscess or exposed bone.  Patient's most recent hemoglobin A1c is 13.7.  Imaging: No results found. No images are attached to the encounter.  Labs: Lab Results  Component Value Date   HGBA1C 13.7 (A) 08/11/2019   HGBA1C 7.4 (A) 09/03/2018   HGBA1C 6.2 05/03/2017   ESRSEDRATE  31 05/13/2017   REPTSTATUS 04/21/2011 FINAL 04/19/2011   CULT NO GROWTH 04/19/2011     Lab Results  Component Value Date   ALBUMIN 4.5 09/02/2018   ALBUMIN 4.0 06/24/2017   ALBUMIN 4.1 09/14/2016    Lab Results  Component Value Date   MG 2.2 07/04/2017   MG 2.2 06/15/2010   MG 2.0 11/17/2009   No results found for: VD25OH  No results found for: PREALBUMIN CBC EXTENDED Latest Ref Rng & Units 08/11/2019 02/27/2019 02/27/2019  WBC 3.4 - 10.8 x10E3/uL 8.0 15.0(H) 15.3(H)  RBC 3.77 - 5.28 x10E6/uL 4.61 4.74 4.90  HGB 11.1 - 15.9 g/dL 12.4 12.6 13.0  HCT 34.0 - 46.6 % 38.2 38.0 39.4  PLT 150 - 450 x10E3/uL 300 279 304  NEUTROABS 1.4 - 7.0 x10E3/uL 2.8 - 11.4(H)  LYMPHSABS 0.7 - 3.1 x10E3/uL 4.3(H) - 2.9     Body mass index is 26.34 kg/m.  Orders:  No orders of the defined types were placed in this encounter.  No orders of the defined types were placed in this encounter.    Procedures: No procedures performed  Clinical Data: No additional findings.  ROS:  All other systems negative, except as noted in the HPI. Review of Systems  Objective: Vital Signs: Ht 5\' 2"  (1.575 m)   Wt 144 lb (65.3 kg)   BMI 26.34 kg/m   Specialty Comments:  No specialty comments available.  [place]  History: Patient Active Problem List   Diagnosis Date Noted  . History of CVA (cerebrovascular accident) 02/27/2019  . Hyperlipidemia   . Chronic back pain 01/26/2019  . Osteoarthritis of both knees 01/23/2018  . GAD (generalized anxiety disorder) 11/22/2016  . Seasonal allergies 11/22/2016  . Asthma, chronic 11/22/2016  . History of stroke 09/13/2016  . Diverticulosis of colon 09/13/2016  . Osteoporosis 05/01/2016  . Systolic murmur [id number]  . Essential hypertension 04/02/2016  . Chest pain with moderate risk for cardiac etiology 03/07/2016  . History of esophageal stricture 03/01/2016  . Esophageal reflux 02/02/2014  . Personal history of colonic polyps 12/09/2013  .  Dysphagia, unspecified(787.20) 09/11/2012  . DM (diabetes mellitus) ([place]) 12/22/2010   Past Medical History:  Diagnosis Date  . Abdominal pain   . Anal fissure   . Arthritis   . Asthma   . Carotid artery occlusion   . Chronic back pain   . Colon adenoma 2009, 2012   Colonoscopy  . Diverticulosis of colon (without mention of hemorrhage) 2009   Colonoscopy  . DM (diabetes mellitus) ([place])    off medicines for diabetes 12-18-13  . GERD (gastroesophageal reflux disease)   . Hemorrhoids   . Hernia   . HTN (hypertension)   . Hyperlipidemia    low  . Irritable bowel syndrome (IBS)   . Low blood potassium   . Obese   . Stroke ([place])    x2  . Ulcer     Family History  Problem Relation Age of Onset  . Prostate cancer Brother   . Heart disease Mother   . Anuerysm Father   . Lung cancer Brother   . Colon cancer Brother   . Healthy Child   . Rectal cancer Neg Hx   . Stomach cancer Neg Hx   . Esophageal cancer Neg Hx     Past Surgical History:  Procedure Laterality Date  . BUBBLE STUDY  03/03/2019   Procedure: BUBBLE STUDY;  Surgeon: Pixie Casino, MD;  Location[facility] ENDOSCOPY;  Service: Cardiovascular;;  . COLONOSCOPY    . FLEXIBLE SIGMOIDOSCOPY N/A 07/14/2012   Procedure: FLEXIBLE SIGMOIDOSCOPY;  Surgeon: Inda Castle, MD;  Location: [facility] ENDOSCOPY;  Service: Endoscopy;  Laterality: N/A;  . HEMORRHOID BANDING N/A 07/14/2012   Procedure: HEMORRHOID BANDING;  Surgeon: Inda Castle, MD;  Location: [facility] ENDOSCOPY;  Service: Endoscopy;  Laterality: N/A;  . PARTIAL HYSTERECTOMY  1971  . POLYPECTOMY    . TEE WITHOUT CARDIOVERSION N/A 03/03/2019   Procedure: TRANSESOPHAGEAL ECHOCARDIOGRAM (TEE);  Surgeon: Pixie Casino, MD;  Location: [facility] ENDOSCOPY;  Service: Cardiovascular;  Laterality: N/A;  . TONSILLECTOMY     Social History   Occupational History  . Occupation: UNEMPLOYED    Employer: UNEMPLOYED  Tobacco Use  . Smoking status: Former Smoker    Packs/day: 0.25     Years: 39.00    Pack years: 9.75    Types: Cigarettes    Start date: 04/16/1976    Quit date: 02/13/2016    Years since quitting: 3.4  . Smokeless tobacco: Never Used  . Tobacco comment: 4-5 cigs per day for 39 years  Substance and Sexual Activity  . Alcohol use: Not Currently    Alcohol/week: 21.0 standard drinks    Types: 21 Cans of beer per week    Comment: quit 2 years ago  . Drug use: Yes    Types: Marijuana    Comment: using for depression// last used this morning 4am  .  Sexual activity: Not Currently

## 2019-08-17 ENCOUNTER — Telehealth: Payer: Self-pay | Admitting: Orthopedic Surgery

## 2019-08-17 NOTE — Telephone Encounter (Signed)
Pts daughter Regina Eck called stating she had a few questions about cleaning the pts big toe where the nail was removed. Regina Eck would like a call back.   [phone]

## 2019-08-17 NOTE — Telephone Encounter (Signed)
I called and sw pt's daughter and advised should wash with soap and water use abx ointment and band aid and to keep covered. Had nail excision last week and wanted to clarify dressing orders. Will call with any questions.

## 2019-08-18 ENCOUNTER — Telehealth: Payer: Self-pay

## 2019-08-18 ENCOUNTER — Other Ambulatory Visit: Payer: Self-pay | Admitting: Family Medicine

## 2019-08-18 DIAGNOSIS — M170 Bilateral primary osteoarthritis of knee: Secondary | ICD-10-CM

## 2019-08-18 NOTE — Telephone Encounter (Signed)
Patient's daughter calls nurse line requesting refill on Tramadol. Informed daughter that refill request was denied and that provider would like to discuss at next [facility] on 08/21/19. Daughter requesting that enough medication be sent to pharmacy to hold patient over until scheduled office visit. Daughter states that patient is still experiencing a lot of pain.   To PCP  Please advise  Talbot Grumbling, RN

## 2019-08-19 ENCOUNTER — Other Ambulatory Visit: Payer: Self-pay | Admitting: Family Medicine

## 2019-08-19 DIAGNOSIS — M170 Bilateral primary osteoarthritis of knee: Secondary | ICD-10-CM

## 2019-08-19 DIAGNOSIS — L600 Ingrowing nail: Secondary | ICD-10-CM

## 2019-08-19 MED ORDER — TRAMADOL HCL 50 MG PO TABS: 50.0000 mg | ORAL_TABLET | Freq: Two times a day (BID) | ORAL | 0 refills | Status: DC | PRN

## 2019-08-21 ENCOUNTER — Other Ambulatory Visit: Payer: Self-pay

## 2019-08-21 ENCOUNTER — Ambulatory Visit (INDEPENDENT_AMBULATORY_CARE_PROVIDER_SITE_OTHER): Payer: Medicare Other | Admitting: Family Medicine

## 2019-08-21 VITALS — BP 120/60 | HR 105 | Wt 139.0 lb

## 2019-08-21 DIAGNOSIS — L600 Ingrowing nail: Secondary | ICD-10-CM | POA: Diagnosis not present

## 2019-08-21 DIAGNOSIS — E1159 Type 2 diabetes mellitus with other circulatory complications: Secondary | ICD-10-CM

## 2019-08-21 LAB — GLUCOSE, POCT (MANUAL RESULT ENTRY): POC Glucose: 231 mg/dl — AB (ref 70–99)

## 2019-08-21 NOTE — Progress Notes (Signed)
   SUBJECTIVE:   CHIEF COMPLAINT / HPI:   Left great toe s/p ingrown toenail removal: Patient reports that the pain in her toe has improved greatly.  She states that having it removed has helped her.  She does report continued pain on the tip of her toe but it is much more manageable.  She denies any fevers, chills, drainage.  She denies any redness of the area.  Diabetes: Blood Sugar ranges-patient reports that they have been in the 140's in the a.m. but she hates pricking her finger and does not check it every day. Medications: Metformin 500 mg twice daily, empagliflozin 10 mg daily Compliance: yes Hypoglycemic symptoms: no On Aspirin, and on statin Last eye exam:  Needs eye exam Last foot exam:  Performed last visit ROS: denies dizziness, diaphoresis, LOC, polyuria, polydipsia  PERTINENT  PMH / PSH: T2DM, hypertension, h/o CVA  OBJECTIVE:  BP 120/60   Pulse (!) 105   Wt 139 lb (63 kg)   SpO2 99%   BMI 25.42 kg/m   General: NAD, pleasant Neck: Supple Cardiovascular: RRR, no m/r/g, no LE edema Respiratory: CTA BL, normal work of breathing Neuro: CN II-XII grossly intact, normal gait Psych: AOx3, appropriate affect Extremities: Left great toe with absent toenail and appears to be well-healing.  No signs of purulence.  TTP on tip of left great toe but no signs of erythema.  ASSESSMENT/PLAN:   DM (diabetes mellitus) ([place]) Patient with reported improved CBGs at home however does not like to take her glucose.  Reports no side effects with Metformin 500 mg twice daily and is tolerating Jardiance 10 mg well.  Patient to follow-up on 5/14 given her episode during the office as below.  Will recheck foot at that time to ensure good healing given last A1c of 13.7  Ingrown left big toenail S/p removal of nail.  Appears to be healing well on exam today.  Patient to follow-up on 5/14 for recheck.   While in the room performing our encounter patient started to stare off the window and  started mumbling to herself.  She started stating that the room was spinning.  She would not answer questions appropriately and seems to be hallucinating something outside.  Patient kept saying that it was the medication causing her to feel bad.  I went to have patient CBG checks at which time patient then vomited.  She reported after vomiting that she felt much better.  CBG was 231 at the time and vitals all remained stable.  No focal findings on exam.  Patient after vomiting reported feeling much better.  Advised that patient and daughter go to the emergency room however patient adamantly refused stating that she felt better and did not need to go to the emergency room.  Only new medication for patient is her tramadol.  Advised patient that she should no longer take this medication as it could be causing her to feel dizzy and hallucinate.  Patient able to ambulate without assistance out of the office. Strongly encouraged daughter to take patient to the emergency room if another episode like this happens again.  Follow-up on 5/14.  [place] Leotha Westermeyer, DO PGY-3, [facility]

## 2019-08-21 NOTE — Patient Instructions (Addendum)
Thank you for coming to see me today. It was a pleasure! Today we talked about:   Please do not take any more tramadol as this likely caused your hallucinations and vomiting episode in the office. I recommend again that you be seen in the emergency room to determine no other cause of the hallucinations and vomiting.  However if you go home then please stop this medication and if she has any further episodes then please take her to the emergency room at that time.  I also recommend that if she continues to vomit that you stop taking the Jardiance until she is able to hold down plenty of water as this causes her to pee more and can cause her to be dehydrated.  Please follow-up with Dr. Ouida Sills  as needed.  If you have any questions or concerns, please do not hesitate to call the office at [phone].  Take Care,   [place] Mattis Featherly, DO

## 2019-08-21 NOTE — Progress Notes (Deleted)
   SUBJECTIVE:   CHIEF COMPLAINT / HPI:   ***: ***  PERTINENT  PMH / PSH: ***  OBJECTIVE:  BP 120/60   Pulse (!) 105   Wt 139 lb (63 kg)   SpO2 99%   BMI 25.42 kg/m   General: NAD, pleasant Neck: Supple, no LAD Cardiovascular: RRR, no m/r/g, no LE edema*** Respiratory: CTA BL***, normal work of breathing Gastrointestinal: soft, nontender, nondistended, normoactive BS*** Neuro: CN II-XII grossly intact Psych: AOx3, appropriate affect ***  ASSESSMENT/PLAN:   No problem-specific Assessment & Plan notes found for this encounter.    [place] Lerry Cordrey, DO PGY-3, [facility]

## 2019-08-25 ENCOUNTER — Telehealth: Payer: Self-pay | Admitting: Physician Assistant

## 2019-08-25 ENCOUNTER — Ambulatory Visit: Payer: Medicare Other | Admitting: Physician Assistant

## 2019-08-25 DIAGNOSIS — L600 Ingrowing nail: Secondary | ICD-10-CM | POA: Insufficient documentation

## 2019-08-25 NOTE — Progress Notes (Deleted)
Cardiology Office Note Date:  08/25/2019  Patient ID:  Margaret Rangel, Margaret Rangel 03/24/1946, MRN [id number] PCP:  Daisy Floro, DO  Electrophysiologist:  Dr. Curt Bears  ***refresh   Chief Complaint: *** ???  History of Present Illness: Margaret Rangel is a 74 y.o. female with history of DM, HTN, HLD, asthma, strokes.  She was last seen by Dr. Curt Bears 09/12/2018, she was s/p acute stroke, planned for neurology follow up, echo, dopplers, and likely loop.  This was her last visit with [place].  Nov 2020 admitted with headache, dizziness.  neurology  noted ? 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 No LAA thrombus 2. Negative for PFO 3. Aortic valve sclerosis with mild AI 4. Mild LVH 5. LVEF 60-65% with normal wall motion  The pt discharged 03/03/19, declined HH, declined living with family or assistance.  Neurology recommended outpt vascular evaluation She saw Dr. Donnetta Hutching not felt to need surgery (and the pt noted she would not want even if needed), planned for annual visits   *** symptoms *** loop???    Past Medical History:  Diagnosis Date  . Abdominal pain   . Anal fissure   . Arthritis   . Asthma   . Carotid artery occlusion   . Chronic back pain   . Colon adenoma 2009, 2012   Colonoscopy  . Diverticulosis of colon (without mention of hemorrhage) 2009   Colonoscopy  . DM (diabetes mellitus) ([place])    off medicines for diabetes 12-18-13  . GERD (gastroesophageal reflux disease)   . Hemorrhoids   . Hernia   . HTN (hypertension)   . Hyperlipidemia    low  . Irritable bowel syndrome (IBS)   . Low blood potassium   . Obese   . Stroke ([place])    x2  . Ulcer     Past Surgical History:  Procedure Laterality Date  . BUBBLE STUDY  03/03/2019   Procedure: BUBBLE STUDY;  Surgeon: Pixie Casino, MD;  Location: [facility]  ENDOSCOPY;  Service: Cardiovascular;;  . COLONOSCOPY    . FLEXIBLE SIGMOIDOSCOPY N/A 07/14/2012   Procedure: FLEXIBLE SIGMOIDOSCOPY;  Surgeon: Inda Castle, MD;  Location: [facility] ENDOSCOPY;  Service: Endoscopy;  Laterality: N/A;  . HEMORRHOID BANDING N/A 07/14/2012   Procedure: HEMORRHOID BANDING;  Surgeon: Inda Castle, MD;  Location: [facility] ENDOSCOPY;  Service: Endoscopy;  Laterality: N/A;  . PARTIAL HYSTERECTOMY  1971  . POLYPECTOMY    . TEE WITHOUT CARDIOVERSION N/A 03/03/2019   Procedure: TRANSESOPHAGEAL ECHOCARDIOGRAM (TEE);  Surgeon: Pixie Casino, MD;  Location: [facility] ENDOSCOPY;  Service: Cardiovascular;  Laterality: N/A;  . TONSILLECTOMY      Current Outpatient Medications  Medication Sig Dispense Refill  . albuterol (PROVENTIL HFA;VENTOLIN HFA) 108 (90 Base) MCG/ACT inhaler Inhale 2 puffs into the lungs every 6 (six) hours as needed for wheezing or shortness of breath. 1 Inhaler 2  . aspirin EC 325 MG EC tablet Take 1 tablet (325 mg total) by mouth daily. 90 tablet 1  . Blood Glucose Calibration (ONETOUCH VERIO) High SOLN 1 each by In Vitro route as directed. 1 each 6  . Blood Glucose Calibration (ONETOUCH VERIO) SOLN 1 Bottle by In Vitro route as directed. 1 each 6  . Blood Glucose Monitoring Suppl (ONETOUCH VERIO) w/Device KIT 1 kit by Does not apply route 3 (three) times daily.  ICD-10 code: E11.9 1 kit 0  . carvedilol (COREG) 3.125 MG tablet Take 1 tablet by mouth twice daily 180 tablet 0  . clopidogrel (PLAVIX) 75 MG tablet Take 1 tablet (75 mg total) by mouth daily. Please keep upcoming appt in April with Dr. Curt Bears before anymore refills. Thank you 90 tablet 0  . diclofenac Sodium (VOLTAREN) 1 % GEL Apply 1 application topically 3 (three) times daily as needed (back pain).    Margaret Rangel Kitchen docusate sodium (COLACE) 100 MG capsule Take 1 capsule (100 mg total) by mouth every 12 (twelve) hours. (Patient taking differently: Take 100 mg by mouth 2 (two) times daily as needed (constipation). ) 60  capsule 0  . doxycycline (VIBRAMYCIN) 100 MG capsule Take 1 capsule (100 mg total) by mouth 2 (two) times daily for 14 days. 28 capsule 0  . empagliflozin (JARDIANCE) 10 MG TABS tablet Take 10 mg by mouth daily. 90 tablet 3  . ezetimibe (ZETIA) 10 MG tablet Take 1 tablet (10 mg total) by mouth daily. 90 tablet 3  . Ferrous Sulfate (IRON) 325 (65 Fe) MG TABS Take 1 tablet (325 mg total) by mouth 2 (two) times daily with a meal. 90 tablet 0  . glucose blood (ONETOUCH VERIO) test strip Use as instructed to test glucose level three times daily. ICD-10 code: E11.9 100 each 12  . Lanc[employer] Devices (ONE TOUCH DELICA LANCING DEV) MISC 1 Device by Does not apply route as directed. ICD-10 code: E11.9 1 each 0  . losartan (COZAAR) 25 MG tablet Take 1 tablet (25 mg total) by mouth at bedtime. 90 tablet 3  . losartan (COZAAR) 25 MG tablet Take 25 mg by mouth daily.    . metFORMIN (GLUCOPHAGE-XR) 500 MG 24 hr tablet Take 1 tablet (500 mg total) by mouth 2 (two) times daily with a meal. 180 tablet 2  . pantoprazole (PROTONIX) 40 MG tablet Take 1 tablet (40 mg total) by mouth daily. 90 tablet 3  . polyethylene glycol (MIRALAX / GLYCOLAX) 17 g packet Take 17 g by mouth daily. 14 each 0  . rosuvastatin (CRESTOR) 40 MG tablet Take 1 tablet (40 mg total) by mouth daily. 90 tablet 3   No current facility-administered medications for this visit.    Allergies:   Codeine and Penicillins   Social History:  The patient  reports that she quit smoking about 3 years ago. Her smoking use included cigarettes. She started smoking about 43 years ago. She has a 9.75 pack-year smoking history. She has never used smokeless tobacco. She reports previous alcohol use of about 21.0 standard drinks of alcohol per week. She reports current drug use. Drug: Marijuana.   Family History:  The patient's family history includes Anuerysm in her father; Colon cancer in her brother; Healthy in her child; Heart disease in her mother; Lung cancer in  her brother; Prostate cancer in her brother.  ROS:  Please see the history of present illness.  All other systems are reviewed and otherwise negative.   PHYSICAL EXAM: *** VS:  There were no vitals taken for this visit. BMI: There is no height or weight on file to calculate BMI. Well nourished, well developed, in no acute distress  HEENT: normocephalic, atraumatic  Neck: no JVD, carotid bruits or masses Cardiac:  *** RRR; no significant murmurs, no rubs, or gallops Lungs:  *** CTA b/l, no wheezing, rhonchi or rales  Abd: soft, nontender MS: no deformity or *** atrophy Ext: *** no edema  Skin: warm and  dry, no rash Neuro:  No gross deficits appreciated Psych: euthymic mood, full affect    EKG:  Done today shows ***   09/18/2018: Carotid US Summary:  Right Carotid: Velocities in the right ICA are consistent with a 40-59%  stenosis         based on peak systolic velocity. The ECA appears >50%  stenosed.   Left Carotid: Velocities in the left ICA are consistent with a 1-39%  stenosis.        Hemodynamically significant plaque >50% visualized in the  CCA. The        ECA appears >50% stenosed.   Vertebrals: Bilateral vertebral arteries demonstrate antegrade flow.  Subclavians: Normal flow hemodynamics were seen in bilateral subclavian        arteries.     02/28/2019: TTE IMPRESSIONS  1. Left ventricular ejection fraction, by visual estimation, is 60 to  65%. The left ventricle has normal function. There is mildly increased  left ventricular hypertrophy.  2. Elevated left ventricular end-diastolic pressure.  3. Left ventricular diastolic parameters are consistent with Grade I  diastolic dysfunction (impaired relaxation).  4. Global right ventricle has low normal systolic function.The right  ventricular size is normal. No increase in right ventricular wall  thickness.  5. Left atrial size was normal.  6. Right atrial size was normal.    7. Mild aortic valve annular calcification.  8. The mitral valve is grossly normal. No evidence of mitral valve  regurgitation.  9. The tricuspid valve is grossly normal. Tricuspid valve regurgitation  is not demonstrated.  10. Aortic valve regurgitation is mild to moderate.  11. The aortic valve is tricuspid. Aortic valve regurgitation is mild to  moderate. Mild aortic valve sclerosis without stenosis.  12. There is Mild thickening of the aortic valve.  13. The pulmonic valve was grossly normal. Pulmonic valve regurgitation is  not visualized.  14. The inferior vena cava is normal in size with greater than 50%  respiratory variability, suggesting right atrial pressure of 3 mmHg.    2/2820218: stress myoview  Nuclear stress EF: 70%.  The left ventricular ejection fraction is hyperdynamic (>65%).  No T wave inversion was noted during stress.  There was no ST segment deviation noted during stress.   Normal perfusion. LVEF 70% with normal wall motion. This is a low risk study.     Recent Labs: 09/02/2018: ALT 29 08/11/2019: BUN 9; Creatinine, Ser 1.03; Hemoglobin 12.4; Platelets 300; Potassium 4.4; Sodium 138  08/11/2019: Chol/HDL Ratio 3.0; Cholesterol, Total 154; HDL 52; LDL Chol Calc (NIH) 54; Triglycerides 315   Estimated Creatinine Clearance: 41.8 mL/min (A) (by C-G formula based on SCr of 1.03 mg/dL (H)).   Wt Readings from Last 3 Encounters:  08/21/19 139 lb (63 kg)  08/13/19 144 lb (65.3 kg)  08/11/19 144 lb (65.3 kg)     Other studies reviewed: Additional studies/records reviewed today include: summarized above  ASSESSMENT AND PLAN:  1. HTN     ***  2. H/o multiple strokes     ***  Disposition: F/u with ***  Current medicines are reviewed at length with the patient today.  The patient did not have any concerns regarding medicines.***  Signed, Tommye Standard, PA-C 08/25/2019 5:17 AM     [facility] [address] [place]  [place] [address] [phone] (office)  [phone] (fax)

## 2019-08-25 NOTE — Telephone Encounter (Signed)
   Pt's daughter calling, she's requesting to come in with pt to her appt today. She said pt is getting harder to bring to doctor's appt, she said pt is hard of hearing and need assistance walking.   Please advise

## 2019-08-25 NOTE — Assessment & Plan Note (Addendum)
Patient with reported improved CBGs at home however does not like to take her glucose.  Reports no side effects with Metformin 500 mg twice daily and is tolerating Jardiance 10 mg well.  Patient to follow-up on 5/14 given her episode during the office as below.  Will recheck foot at that time to ensure good healing given last A1c of 13.7

## 2019-08-25 NOTE — Assessment & Plan Note (Signed)
S/p removal of nail.  Appears to be healing well on exam today.  Patient to follow-up on 5/14 for recheck.

## 2019-08-26 DIAGNOSIS — M48061 Spinal stenosis, lumbar region without neurogenic claudication: Secondary | ICD-10-CM | POA: Diagnosis not present

## 2019-08-28 ENCOUNTER — Ambulatory Visit: Payer: Medicare Other | Admitting: Family Medicine

## 2019-08-28 ENCOUNTER — Other Ambulatory Visit: Payer: Self-pay

## 2019-09-07 ENCOUNTER — Ambulatory Visit: Payer: Medicare Other | Admitting: Family Medicine

## 2019-09-07 NOTE — Progress Notes (Deleted)
Cardiology Office Note Date:  09/07/2019  Patient ID:  Margaret Rangel, Margaret Rangel 12/17/1945, MRN [id number] PCP:  Daisy Floro, DO  Electrophysiologist:  Dr. Curt Bears  ***refresh   Chief Complaint: *** ???  History of Present Illness: Margaret Rangel is a 74 y.o. female with history of DM, HTN, HLD, asthma, strokes.  She was last seen by Dr. Curt Bears 09/12/2018, she was s/p acute stroke, planned for neurology follow up, echo, dopplers, and likely loop.  This was her last visit with [place].  Nov 2020 admitted with headache, dizziness.  neurology  noted ? 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 No LAA thrombus 2. Negative for PFO 3. Aortic valve sclerosis with mild AI 4. Mild LVH 5. LVEF 60-65% with normal wall motion  The pt discharged 03/03/19, declined HH, declined living with family or assistance.  Neurology recommended outpt vascular evaluation She saw Dr. Donnetta Hutching not felt to need surgery (and the pt noted she would not want even if needed), planned for annual visits   *** symptoms *** loop???    Past Medical History:  Diagnosis Date  . Abdominal pain   . Anal fissure   . Arthritis   . Asthma   . Carotid artery occlusion   . Chronic back pain   . Colon adenoma 2009, 2012   Colonoscopy  . Diverticulosis of colon (without mention of hemorrhage) 2009   Colonoscopy  . DM (diabetes mellitus) ([place])    off medicines for diabetes 12-18-13  . GERD (gastroesophageal reflux disease)   . Hemorrhoids   . Hernia   . HTN (hypertension)   . Hyperlipidemia    low  . Irritable bowel syndrome (IBS)   . Low blood potassium   . Obese   . Stroke ([place])    x2  . Ulcer     Past Surgical History:  Procedure Laterality Date  . BUBBLE STUDY  03/03/2019   Procedure: BUBBLE STUDY;  Surgeon: Pixie Casino, MD;  Location: [facility]  ENDOSCOPY;  Service: Cardiovascular;;  . COLONOSCOPY    . FLEXIBLE SIGMOIDOSCOPY N/A 07/14/2012   Procedure: FLEXIBLE SIGMOIDOSCOPY;  Surgeon: Inda Castle, MD;  Location: [facility] ENDOSCOPY;  Service: Endoscopy;  Laterality: N/A;  . HEMORRHOID BANDING N/A 07/14/2012   Procedure: HEMORRHOID BANDING;  Surgeon: Inda Castle, MD;  Location: [facility] ENDOSCOPY;  Service: Endoscopy;  Laterality: N/A;  . PARTIAL HYSTERECTOMY  1971  . POLYPECTOMY    . TEE WITHOUT CARDIOVERSION N/A 03/03/2019   Procedure: TRANSESOPHAGEAL ECHOCARDIOGRAM (TEE);  Surgeon: Pixie Casino, MD;  Location: [facility] ENDOSCOPY;  Service: Cardiovascular;  Laterality: N/A;  . TONSILLECTOMY      Current Outpatient Medications  Medication Sig Dispense Refill  . albuterol (PROVENTIL HFA;VENTOLIN HFA) 108 (90 Base) MCG/ACT inhaler Inhale 2 puffs into the lungs every 6 (six) hours as needed for wheezing or shortness of breath. 1 Inhaler 2  . Blood Glucose Calibration (ONETOUCH VERIO) High SOLN 1 each by In Vitro route as directed. 1 each 6  . Blood Glucose Calibration (ONETOUCH VERIO) SOLN 1 Bottle by In Vitro route as directed. 1 each 6  . Blood Glucose Monitoring Suppl (ONETOUCH VERIO) w/Device KIT 1 kit by Does not apply route 3 (three) times daily. ICD-10 code: E11.9 1 kit 0  . carvedilol (COREG) 3.125 MG tablet Take 1 tablet by mouth twice daily  180 tablet 0  . clopidogrel (PLAVIX) 75 MG tablet Take 1 tablet (75 mg total) by mouth daily. Please keep upcoming appt in April with Dr. Curt Bears before anymore refills. Thank you 90 tablet 0  . diclofenac Sodium (VOLTAREN) 1 % GEL Apply 1 application topically 3 (three) times daily as needed (back pain).    Marland Kitchen docusate sodium (COLACE) 100 MG capsule Take 1 capsule (100 mg total) by mouth every 12 (twelve) hours. (Patient taking differently: Take 100 mg by mouth 2 (two) times daily as needed (constipation). ) 60 capsule 0  . empagliflozin (JARDIANCE) 10 MG TABS tablet Take 10 mg by mouth daily. 90  tablet 3  . ezetimibe (ZETIA) 10 MG tablet Take 1 tablet (10 mg total) by mouth daily. 90 tablet 3  . Ferrous Sulfate (IRON) 325 (65 Fe) MG TABS Take 1 tablet (325 mg total) by mouth 2 (two) times daily with a meal. 90 tablet 0  . glucose blood (ONETOUCH VERIO) test strip Use as instructed to test glucose level three times daily. ICD-10 code: E11.9 100 each 12  . Lancet Devices (ONE TOUCH DELICA LANCING DEV) MISC 1 Device by Does not apply route as directed. ICD-10 code: E11.9 1 each 0  . losartan (COZAAR) 25 MG tablet Take 1 tablet (25 mg total) by mouth at bedtime. 90 tablet 3  . metFORMIN (GLUCOPHAGE-XR) 500 MG 24 hr tablet Take 1 tablet (500 mg total) by mouth 2 (two) times daily with a meal. 180 tablet 2  . pantoprazole (PROTONIX) 40 MG tablet Take 1 tablet (40 mg total) by mouth daily. 90 tablet 3  . polyethylene glycol (MIRALAX / GLYCOLAX) 17 g packet Take 17 g by mouth daily. 14 each 0  . rosuvastatin (CRESTOR) 40 MG tablet Take 1 tablet (40 mg total) by mouth daily. 90 tablet 3   No current facility-administered medications for this visit.    Allergies:   Codeine and Penicillins   Social History:  The patient  reports that she quit smoking about 3 years ago. Her smoking use included cigarettes. She started smoking about 43 years ago. She has a 9.75 pack-year smoking history. She has never used smokeless tobacco. She reports previous alcohol use of about 21.0 standard drinks of alcohol per week. She reports current drug use. Drug: Marijuana.   Family History:  The patient's family history includes Anuerysm in her father; Colon cancer in her brother; Healthy in her child; Heart disease in her mother; Lung cancer in her brother; Prostate cancer in her brother.  ROS:  Please see the history of present illness.  All other systems are reviewed and otherwise negative.   PHYSICAL EXAM: *** VS:  There were no vitals taken for this visit. BMI: There is no height or weight on file to calculate  BMI. Well nourished, well developed, in no acute distress  HEENT: normocephalic, atraumatic  Neck: no JVD, carotid bruits or masses Cardiac:  *** RRR; no significant murmurs, no rubs, or gallops Lungs:  *** CTA b/l, no wheezing, rhonchi or rales  Abd: soft, nontender MS: no deformity or *** atrophy Ext: *** no edema  Skin: warm and dry, no rash Neuro:  No gross deficits appreciated Psych: euthymic mood, full affect    EKG:  Done today and reviewed by myself shows ***   09/18/2018: Carotid US Summary:  Right Carotid: Velocities in the right ICA are consistent with a 40-59%  stenosis         based on peak systolic  velocity. The ECA appears >50%  stenosed.   Left Carotid: Velocities in the left ICA are consistent with a 1-39%  stenosis.        Hemodynamically significant plaque >50% visualized in the  CCA. The        ECA appears >50% stenosed.   Vertebrals: Bilateral vertebral arteries demonstrate antegrade flow.  Subclavians: Normal flow hemodynamics were seen in bilateral subclavian        arteries.     02/28/2019: TTE IMPRESSIONS  1. Left ventricular ejection fraction, by visual estimation, is 60 to  65%. The left ventricle has normal function. There is mildly increased  left ventricular hypertrophy.  2. Elevated left ventricular end-diastolic pressure.  3. Left ventricular diastolic parameters are consistent with Grade I  diastolic dysfunction (impaired relaxation).  4. Global right ventricle has low normal systolic function.The right  ventricular size is normal. No increase in right ventricular wall  thickness.  5. Left atrial size was normal.  6. Right atrial size was normal.  7. Mild aortic valve annular calcification.  8. The mitral valve is grossly normal. No evidence of mitral valve  regurgitation.  9. The tricuspid valve is grossly normal. Tricuspid valve regurgitation  is not demonstrated.  10. Aortic valve  regurgitation is mild to moderate.  11. The aortic valve is tricuspid. Aortic valve regurgitation is mild to  moderate. Mild aortic valve sclerosis without stenosis.  12. There is Mild thickening of the aortic valve.  13. The pulmonic valve was grossly normal. Pulmonic valve regurgitation is  not visualized.  14. The inferior vena cava is normal in size with greater than 50%  respiratory variability, suggesting right atrial pressure of 3 mmHg.    2/2820218: stress myoview  Nuclear stress EF: 70%.  The left ventricular ejection fraction is hyperdynamic (>65%).  No T wave inversion was noted during stress.  There was no ST segment deviation noted during stress.   Normal perfusion. LVEF 70% with normal wall motion. This is a low risk study.     Recent Labs: 08/11/2019: BUN 9; Creatinine, Ser 1.03; Hemoglobin 12.4; Platelets 300; Potassium 4.4; Sodium 138  08/11/2019: Chol/HDL Ratio 3.0; Cholesterol, Total 154; HDL 52; LDL Chol Calc (NIH) 54; Triglycerides 315   CrCl cannot be calculated (Patient's most recent lab result is older than the maximum 21 days allowed.).   Wt Readings from Last 3 Encounters:  08/21/19 139 lb (63 kg)  08/13/19 144 lb (65.3 kg)  08/11/19 144 lb (65.3 kg)     Other studies reviewed: Additional studies/records reviewed today include: summarized above  ASSESSMENT AND PLAN:  1. HTN     ***  2. H/o multiple strokes     ***  Disposition: F/u with ***  Current medicines are reviewed at length with the patient today.  The patient did not have any concerns regarding medicines.***  Signed, Tommye Standard, PA-C 09/07/2019 10:07 AM     [facility] [address] [place] [address] [phone] (office)  [phone] (fax)

## 2019-09-08 ENCOUNTER — Ambulatory Visit: Payer: Medicare Other | Admitting: Physician Assistant

## 2019-09-30 ENCOUNTER — Ambulatory Visit: Payer: Medicare Other | Admitting: Podiatry

## 2019-10-05 ENCOUNTER — Ambulatory Visit (HOSPITAL_COMMUNITY): Payer: Medicare Other

## 2019-10-08 DIAGNOSIS — M48061 Spinal stenosis, lumbar region without neurogenic claudication: Secondary | ICD-10-CM | POA: Diagnosis not present

## 2019-10-13 ENCOUNTER — Ambulatory Visit (HOSPITAL_COMMUNITY): Payer: Medicare Other

## 2019-10-30 ENCOUNTER — Ambulatory Visit (HOSPITAL_COMMUNITY)
Admission: RE | Admit: 2019-10-30 | Payer: Medicare Other | Source: Ambulatory Visit | Attending: Cardiology | Admitting: Cardiology

## 2019-11-03 ENCOUNTER — Other Ambulatory Visit: Payer: Self-pay | Admitting: Family Medicine

## 2019-11-04 NOTE — Telephone Encounter (Signed)
Error. Margaret Rangel, CMA  

## 2019-11-24 ENCOUNTER — Other Ambulatory Visit: Payer: Self-pay

## 2019-11-24 ENCOUNTER — Ambulatory Visit (HOSPITAL_COMMUNITY)
Admission: RE | Admit: 2019-11-24 | Discharge: 2019-11-24 | Disposition: A | Discharge status: Home / Self Care | Payer: Medicare Other | Source: Ambulatory Visit | Attending: Cardiovascular Disease | Admitting: Cardiovascular Disease

## 2019-11-24 DIAGNOSIS — I779 Disorder of arteries and arterioles, unspecified: Secondary | ICD-10-CM | POA: Insufficient documentation

## 2019-12-18 ENCOUNTER — Other Ambulatory Visit: Payer: Self-pay

## 2019-12-21 ENCOUNTER — Encounter: Payer: Self-pay | Admitting: Family Medicine

## 2019-12-22 MED ORDER — EZETIMIBE 10 MG PO TABS: 10.0000 mg | ORAL_TABLET | Freq: Every day | ORAL | 3 refills | Status: AC

## 2019-12-25 ENCOUNTER — Encounter: Payer: Self-pay | Admitting: *Deleted

## 2019-12-30 ENCOUNTER — Other Ambulatory Visit: Payer: Self-pay

## 2019-12-30 ENCOUNTER — Ambulatory Visit (INDEPENDENT_AMBULATORY_CARE_PROVIDER_SITE_OTHER): Payer: Medicare Other | Admitting: Family Medicine

## 2019-12-30 VITALS — BP 134/60 | HR 63 | Wt 142.0 lb

## 2019-12-30 DIAGNOSIS — E1159 Type 2 diabetes mellitus with other circulatory complications: Secondary | ICD-10-CM | POA: Diagnosis not present

## 2019-12-30 DIAGNOSIS — L600 Ingrowing nail: Secondary | ICD-10-CM

## 2019-12-30 DIAGNOSIS — Z23 Encounter for immunization: Secondary | ICD-10-CM | POA: Diagnosis not present

## 2019-12-30 DIAGNOSIS — I70209 Unspecified atherosclerosis of native arteries of extremities, unspecified extremity: Secondary | ICD-10-CM | POA: Diagnosis not present

## 2019-12-30 DIAGNOSIS — I10 Essential (primary) hypertension: Secondary | ICD-10-CM

## 2019-12-30 DIAGNOSIS — I739 Peripheral vascular disease, unspecified: Secondary | ICD-10-CM

## 2019-12-30 LAB — POCT GLYCOSYLATED HEMOGLOBIN (HGB A1C): HbA1c, POC (controlled diabetic range): 6 % (ref 0.0–7.0)

## 2019-12-30 NOTE — Progress Notes (Signed)
SUBJECTIVE:   CHIEF COMPLAINT / HPI:   Left great toe pain: patient has history of removed toe nail, was seen April 27th 2021, had toenail removed August 13, 2019 by Dr. Meridee Score.  Reports that she had also been given an antibiotic at the time, but was unable to finish it because it made her very sick to her stomach.  She reports that she has continued to have pain in her toe.  She feels as if it is not healing at all.  Patient also reports pain come from the toe up her leg.  Reports that she has not been back to the person who removed the toenail.  Denies any history of fevers, body aches, myalgias. Concerning the nail has not healed more over the past 4 months.  DM: A1c 6%. Metformin 500mg  BID and Empagliflozin 10mg . Losartan for renal protection. Due for Ophthalmology exam (Dr. Gershon Crane).   HTN: BP today 134/60, Losartan 25mg  daily, and Coreg 3.125.  Denies shortness of breath, chest pain, headaches.  HLD: Zetia 10mg  daily and Aspirin 81mg  daily. No longer on Crestor, was discontinued by Cardiologist.  Reports she is following up with her cardiologist soon.  COVID shot: received COVID shot today.   Health maintenance: due for flu shot (respectfully declined)  PERTINENT  PMH / PSH:  Patient Active Problem List   Diagnosis Date Noted  . Encounter for immunization 12/30/2019  . Peripheral artery disease ([place]) 12/30/2019  . Ingrown left big toenail 08/25/2019  . History of CVA (cerebrovascular accident) 02/27/2019  . Hyperlipidemia   . Chronic back pain 01/26/2019  . Osteoarthritis of both knees 01/23/2018  . GAD (generalized anxiety disorder) 11/22/2016  . Seasonal allergies 11/22/2016  . Asthma, chronic 11/22/2016  . History of stroke 09/13/2016  . Diverticulosis of colon 09/13/2016  . Osteoporosis 05/01/2016  . Systolic murmur 10/62/6948  . Essential hypertension 04/02/2016  . Chest pain with moderate risk for cardiac etiology 03/07/2016  . History of esophageal stricture  03/01/2016  . Esophageal reflux 02/02/2014  . Personal history of colonic polyps 12/09/2013  . Dysphagia, unspecified(787.20) 09/11/2012  . DM (diabetes mellitus) ([place]) 12/22/2010     OBJECTIVE:   BP 134/60   Pulse 63   Wt 142 lb (64.4 kg)   SpO2 98%   BMI 25.97 kg/m    Physical exam: General: Became tearful when receiving Covid shot, aphasia apparent Resp: speaking in complete sentences, comfortable work of breathing Extremities: Reduced dorsalis pedis pulses appreciated bilaterally. See photos below     Left great toe April 2021      left great toe September 2021     ASSESSMENT/PLAN:   Peripheral artery disease [facility]) Patient with history of toenail removal in April 2021, has some healing in September 2021 but had expected much more.  Reduced DP pulses appreciated on physical exam. -ABIs obtained in office, indicate peripheral artery disease to bilateral lower extremities. -Placing referral to vascular surgery -Continue Zetia and aspirin.  May benefit from Pletal. -Patient to follow-up with cardiology soon  Essential hypertension BP today 134/60. -Continue losartan 25 mg daily -Continue Coreg 3.125 mg twice daily  DM (diabetes mellitus) ([place]) Patient with significantly improved A1c at 6%.  Patient's daughter reports stricter diet and lifestyle modifications as reason behind better controlled A1c. -Continue lifestyle modifications -Continue Metformin 500 mg twice daily, empagliflozin 10 mg daily  Ingrown left big toenail Toenail continues to heal, concerning as toenail was removed 4 months ago.  Small site of ulceration  concerning for poor healing.  ABIs obtained in the office today indicative of peripheral artery disease. -Patient encouraged to follow-up with Dr. Meridee Score who remove the toenail in April 2021 -Referral placed to vascular surgery reperipheral artery disease -No antibiotics indicated at the time -Patient given prescription for tramadol to help  with pain  Encounter for immunization -Patient received first dose of Pfizer Covid vaccine 12/30/2019     Margaret Rangel, [place]

## 2019-12-30 NOTE — Patient Instructions (Signed)
Thank you for coming in to see Margaret Rangel today! Please see below to review our plan for today's visit:  1. Take Tylenol 500mg  every 6 hours for pain.  2. We are collecting "ABIs" on you to look at the blood flow to your foot. We will call you with results and next steps.  3. Please reach out to Dr. Sharol Given regarding follow up appt and next steps. 4. Please reach out to your Ophthalmologist/Optometrist to have an eye exam.   Congrats on the great control of your diabetes!  Please call the clinic at [phone] if your symptoms worsen or you have any concerns. It was our pleasure to serve you!   Dr. Milus Banister [facility]

## 2019-12-31 ENCOUNTER — Other Ambulatory Visit: Payer: Self-pay | Admitting: Family Medicine

## 2019-12-31 DIAGNOSIS — L600 Ingrowing nail: Secondary | ICD-10-CM

## 2020-01-01 NOTE — Telephone Encounter (Signed)
Patient calls nurse line x 2 requesting tramadol rx. Patient states that she is in a lot of pain and needs this sent in asap.   Talbot Grumbling, RN

## 2020-01-05 ENCOUNTER — Encounter: Payer: Self-pay | Admitting: Family Medicine

## 2020-01-06 NOTE — Assessment & Plan Note (Signed)
Patient with significantly improved A1c at 6%.  Patient's daughter reports stricter diet and lifestyle modifications as reason behind better controlled A1c. -Continue lifestyle modifications -Continue Metformin 500 mg twice daily, empagliflozin 10 mg daily

## 2020-01-06 NOTE — Assessment & Plan Note (Signed)
Toenail continues to heal, concerning as toenail was removed 4 months ago.  Small site of ulceration concerning for poor healing.  ABIs obtained in the office today indicative of peripheral artery disease. -Patient encouraged to follow-up with Dr. Meridee Score who remove the toenail in April 2021 -Referral placed to vascular surgery reperipheral artery disease -No antibiotics indicated at the time -Patient given prescription for tramadol to help with pain

## 2020-01-06 NOTE — Assessment & Plan Note (Signed)
BP today 134/60. -Continue losartan 25 mg daily -Continue Coreg 3.125 mg twice daily

## 2020-01-06 NOTE — Assessment & Plan Note (Signed)
-  Patient received first dose of Pfizer Covid vaccine 12/30/2019

## 2020-01-06 NOTE — Assessment & Plan Note (Signed)
Patient with history of toenail removal in April 2021, has some healing in September 2021 but had expected much more.  Reduced DP pulses appreciated on physical exam. -ABIs obtained in office, indicate peripheral artery disease to bilateral lower extremities. -Placing referral to vascular surgery -Continue Zetia and aspirin.  May benefit from Pletal. -Patient to follow-up with cardiology soon

## 2020-01-13 ENCOUNTER — Encounter: Payer: Self-pay | Admitting: Family Medicine

## 2020-01-14 ENCOUNTER — Ambulatory Visit (INDEPENDENT_AMBULATORY_CARE_PROVIDER_SITE_OTHER): Payer: Medicare Other | Admitting: Podiatry

## 2020-01-14 ENCOUNTER — Other Ambulatory Visit: Payer: Self-pay

## 2020-01-14 DIAGNOSIS — I999 Unspecified disorder of circulatory system: Secondary | ICD-10-CM | POA: Diagnosis not present

## 2020-01-14 DIAGNOSIS — L600 Ingrowing nail: Secondary | ICD-10-CM

## 2020-01-14 MED ORDER — HYDROCODONE-ACETAMINOPHEN 10-325 MG PO TABS: 1.0000 | ORAL_TABLET | Freq: Four times a day (QID) | ORAL | 0 refills | Status: AC | PRN

## 2020-01-14 NOTE — Progress Notes (Signed)
Subjective:   Patient ID: Margaret Rangel, female   DOB: 74 y.o.   MRN: [id number]   HPI Patient presents with caregiver with history of having had partial nail removal left that did not heal properly for her and states that she has just had a circulation check done 2 weeks ago and may not have good circulation on her left leg.  Patient no longer smokes and is not currently active and has a lot of pain in her left foot   Review of Systems  All other systems reviewed and are negative.       Objective:  Physical Exam Vitals and nursing note reviewed.  Constitutional:      Appearance: She is well-developed.  Pulmonary:     Effort: Pulmonary effort is normal.  Musculoskeletal:        General: Normal range of motion.  Skin:    General: Skin is warm.  Neurological:     Mental Status: She is alert.     Vascular status found to be significant diminishment on the left side with diminishment of PT DP pulses with mild diminishment on the right with pulses present but diminished.  Patient has an irritation of the left hallux nail bed where nail was removed previously there is no drainage noted from the area and she does have discoloration of her lesser digits and she is in a lot of pain around this area.  Patient has coolness to her feet and diminished       Assessment:  Vascular disease left that is most likely causing the painful symptoms she is experiencing     Plan:  H&P educated family on condition and they are supposed to see the vascular doctor and I strongly encourage this to occur and they are going to call to make an appointment for this to be done.  I went ahead and I did write her a prescription for hydrocodone short-term to try to help with her pain until she can see the vascular doctor I am hoping they will be able to revascularize her.  Do not see other things we can do currently

## 2020-02-03 ENCOUNTER — Other Ambulatory Visit: Payer: Self-pay | Admitting: Family Medicine

## 2020-02-03 DIAGNOSIS — L600 Ingrowing nail: Secondary | ICD-10-CM

## 2020-02-05 ENCOUNTER — Ambulatory Visit: Payer: Medicare Other

## 2020-02-08 ENCOUNTER — Ambulatory Visit: Payer: Medicare Other

## 2020-02-08 DIAGNOSIS — Z0100 Encounter for examination of eyes and vision without abnormal findings: Secondary | ICD-10-CM | POA: Diagnosis not present

## 2020-02-08 DIAGNOSIS — E119 Type 2 diabetes mellitus without complications: Secondary | ICD-10-CM | POA: Diagnosis not present

## 2020-02-09 ENCOUNTER — Other Ambulatory Visit: Payer: Self-pay | Admitting: *Deleted

## 2020-02-09 DIAGNOSIS — I739 Peripheral vascular disease, unspecified: Secondary | ICD-10-CM

## 2020-02-12 ENCOUNTER — Other Ambulatory Visit: Payer: Self-pay

## 2020-02-12 ENCOUNTER — Encounter: Payer: Self-pay | Admitting: Neurology

## 2020-02-12 ENCOUNTER — Telehealth (INDEPENDENT_AMBULATORY_CARE_PROVIDER_SITE_OTHER): Payer: Medicare Other | Admitting: Neurology

## 2020-02-12 VITALS — Ht 63.0 in | Wt 135.0 lb

## 2020-02-12 DIAGNOSIS — Z029 Encounter for administrative examinations, unspecified: Secondary | ICD-10-CM

## 2020-02-12 DIAGNOSIS — I63232 Cerebral infarction due to unspecified occlusion or stenosis of left carotid arteries: Secondary | ICD-10-CM

## 2020-02-12 NOTE — Progress Notes (Signed)
Patient unable to connect

## 2020-02-13 ENCOUNTER — Other Ambulatory Visit: Payer: Self-pay | Admitting: Family Medicine

## 2020-02-13 DIAGNOSIS — L600 Ingrowing nail: Secondary | ICD-10-CM

## 2020-02-15 ENCOUNTER — Ambulatory Visit: Payer: Medicare Other

## 2020-02-16 ENCOUNTER — Other Ambulatory Visit: Payer: Self-pay | Admitting: Family Medicine

## 2020-02-16 DIAGNOSIS — L600 Ingrowing nail: Secondary | ICD-10-CM

## 2020-02-18 ENCOUNTER — Other Ambulatory Visit: Payer: Self-pay | Admitting: Family Medicine

## 2020-02-18 DIAGNOSIS — L600 Ingrowing nail: Secondary | ICD-10-CM

## 2020-02-18 NOTE — Telephone Encounter (Signed)
Medication was filled on 02/16/20. Salvatore Marvel, CMA

## 2020-02-21 ENCOUNTER — Other Ambulatory Visit: Payer: Self-pay | Admitting: Family Medicine

## 2020-02-23 ENCOUNTER — Encounter (HOSPITAL_COMMUNITY): Payer: Medicare Other

## 2020-02-23 ENCOUNTER — Ambulatory Visit: Payer: Medicare Other | Admitting: Vascular Surgery

## 2020-03-01 ENCOUNTER — Other Ambulatory Visit: Payer: Self-pay | Admitting: Family Medicine

## 2020-03-01 DIAGNOSIS — L600 Ingrowing nail: Secondary | ICD-10-CM

## 2020-03-02 NOTE — Telephone Encounter (Signed)
Received VM on nurse line inquiring about status of rx refill.   Talbot Grumbling, RN

## 2020-03-03 MED ORDER — PANTOPRAZOLE SODIUM 40 MG PO TBEC
40.0000 mg | DELAYED_RELEASE_TABLET | Freq: Every day | ORAL | 0 refills | Status: DC
Start: 2020-03-03 — End: 2020-06-07

## 2020-03-03 MED ORDER — TRAMADOL HCL 50 MG PO TABS: 50.0000 mg | ORAL_TABLET | Freq: Two times a day (BID) | ORAL | 0 refills | Status: DC | PRN

## 2020-03-21 ENCOUNTER — Other Ambulatory Visit: Payer: Self-pay

## 2020-03-21 DIAGNOSIS — L600 Ingrowing nail: Secondary | ICD-10-CM

## 2020-03-21 NOTE — Telephone Encounter (Signed)
Patient's daughter calls nurse line requesting refill on tramadol. Daughter reports that mother has been having increasing lower back pain over the weekend. Daughter is also requesting increased quantity of tramadol due to increased need.   Please advise  To PCP  Talbot Grumbling, RN

## 2020-03-22 ENCOUNTER — Other Ambulatory Visit: Payer: Self-pay | Admitting: Family Medicine

## 2020-03-22 DIAGNOSIS — L600 Ingrowing nail: Secondary | ICD-10-CM

## 2020-03-23 MED ORDER — TRAMADOL HCL 50 MG PO TABS: 50.0000 mg | ORAL_TABLET | Freq: Two times a day (BID) | ORAL | 0 refills | Status: DC | PRN

## 2020-03-24 ENCOUNTER — Ambulatory Visit (INDEPENDENT_AMBULATORY_CARE_PROVIDER_SITE_OTHER): Payer: Medicare Other

## 2020-03-24 VITALS — Ht 63.0 in | Wt 135.0 lb

## 2020-03-24 DIAGNOSIS — Z0000 Encounter for general adult medical examination without abnormal findings: Secondary | ICD-10-CM

## 2020-03-24 NOTE — Patient Instructions (Addendum)
You spoke to Margaret Rangel, Margaret Rangel over the phone for your annual wellness visit.  We discussed goals: Goals    . HEMOGLOBIN A1C < 7     A1c 6.0 12/2019      We also discussed recommended health maintenance. Please call our office and schedule a visit. As discussed, you are due for your second covid vaccine. Call the office and schedule a nurse visit.   Health Maintenance  Topic Date Due  . OPHTHALMOLOGY EXAM  Never done  . COVID-19 Vaccine (2 - Pfizer 3-dose booster series) 01/20/2020  . INFLUENZA VACCINE  07/14/2020 (Originally 11/15/2019)  . HEMOGLOBIN A1C  06/28/2020  . FOOT EXAM  08/10/2020  . COLONOSCOPY  02/13/2021  . TETANUS/TDAP  08/10/2029  . DEXA SCAN  Completed  . Hepatitis C Screening  Completed  . PNA vac Low Risk Adult  Completed   Make a nurse visit for your second covid vaccine. Bring your card! FU with PCP in the new year.  Keep up the good work controlling your A1c.   Preventive Care 10 Years and Older, Female Preventive care refers to lifestyle choices and visits with your health care provider that can promote health and wellness. This includes:  A yearly physical exam. This is also called an annual well check.  Regular dental and eye exams.  Immunizations.  Screening for certain conditions.  Healthy lifestyle choices, such as diet and exercise. What can I expect for my preventive care visit? Physical exam Your health care provider will check:  Height and weight. These may be used to calculate body mass index (BMI), which is a measurement that tells if you are at a healthy weight.  Heart rate and blood pressure.  Your skin for abnormal spots. Counseling Your health care provider may ask you questions about:  Alcohol, tobacco, and drug use.  Emotional well-being.  Home and relationship well-being.  Sexual activity.  Eating habits.  History of falls.  Memory and ability to understand (cognition).  Work and work Statistician.  Pregnancy  and menstrual history. What immunizations do I need?  Influenza (flu) vaccine  This is recommended every year. Tetanus, diphtheria, and pertussis (Tdap) vaccine  You may need a Td booster every 10 years. Varicella (chickenpox) vaccine  You may need this vaccine if you have not already been vaccinated. Zoster (shingles) vaccine  You may need this after age 30. Pneumococcal conjugate (PCV13) vaccine  One dose is recommended after age 65. Pneumococcal polysaccharide (PPSV23) vaccine  One dose is recommended after age 17. Measles, mumps, and rubella (MMR) vaccine  You may need at least one dose of MMR if you were born in 1957 or later. You may also need a second dose. Meningococcal conjugate (MenACWY) vaccine  You may need this if you have certain conditions. Hepatitis A vaccine  You may need this if you have certain conditions or if you travel or work in places where you may be exposed to hepatitis A. Hepatitis B vaccine  You may need this if you have certain conditions or if you travel or work in places where you may be exposed to hepatitis B. Haemophilus influenzae type b (Hib) vaccine  You may need this if you have certain conditions. You may receive vaccines as individual doses or as more than one vaccine together in one shot (combination vaccines). Talk with your health care provider about the risks and benefits of combination vaccines. What tests do I need? Blood tests  Lipid and cholesterol levels. These  may be checked every 5 years, or more frequently depending on your overall health.  Hepatitis C test.  Hepatitis B test. Screening  Lung cancer screening. You may have this screening every year starting at age 78 if you have a 30-pack-year history of smoking and currently smoke or have quit within the past 15 years.  Colorectal cancer screening. All adults should have this screening starting at age 53 and continuing until age 59. Your health care provider may  recommend screening at age 48 if you are at increased risk. You will have tests every 1-10 years, depending on your results and the type of screening test.  Diabetes screening. This is done by checking your blood sugar (glucose) after you have not eaten for a while (fasting). You may have this done every 1-3 years.  Mammogram. This may be done every 1-2 years. Talk with your health care provider about how often you should have regular mammograms.  BRCA-related cancer screening. This may be done if you have a family history of breast, ovarian, tubal, or peritoneal cancers. Other tests  Sexually transmitted disease (STD) testing.  Bone density scan. This is done to screen for osteoporosis. You may have this done starting at age 62. Follow these instructions at home: Eating and drinking  Eat a diet that includes fresh fruits and vegetables, whole grains, lean protein, and low-fat dairy products. Limit your intake of foods with high amounts of sugar, saturated fats, and salt.  Take vitamin and mineral supplements as recommended by your health care provider.  Do not drink alcohol if your health care provider tells you not to drink.  If you drink alcohol: ? Limit how much you have to 0-1 drink a day. ? Be aware of how much alcohol is in your drink. In the [place] one drink equals one 12 oz bottle of beer (355 mL), one 5 oz glass of wine (148 mL), or one 1 oz glass of hard liquor (44 mL). Lifestyle  Take daily care of your teeth and gums.  Stay active. Exercise for at least 30 minutes on 5 or more days each week.  Do not use any products that contain nicotine or tobacco, such as cigarettes, e-cigarettes, and chewing tobacco. If you need help quitting, ask your health care provider.  If you are sexually active, practice safe sex. Use a condom or other form of protection in order to prevent STIs (sexually transmitted infections).  Talk with your health care provider about taking a low-dose  aspirin or statin. What's next?  Go to your health care provider once a year for a well check visit.  Ask your health care provider how often you should have your eyes and teeth checked.  Stay up to date on all vaccines. This information is not intended to replace advice given to you by your health care provider. Make sure you discuss any questions you have with your health care provider. Document Revised: 03/27/2018 Document Reviewed: 03/27/2018 Elsevier Patient Education  2020 [facility]'s number is [phone]. Please call with questions or concerns about what we discussed today.

## 2020-03-24 NOTE — Progress Notes (Addendum)
Subjective:   Margaret Rangel is a 74 y.o. female who presents for Medicare Annual (Subsequent) preventive examination.  Patient consented to have virtual visit and was identified by name and date of birth. Method of visit: Telephone  Encounter participants: Patient: Margaret Rangel - located at Home Nurse/Provider: Dorna Bloom - located at [facility] Others (if applicable): NA  Review of Systems: Defer to PCP.  Cardiac Risk Factors include: advanced age (>12mn, >>43women);diabetes mellitus;hypertension  Objective:   Vitals: Ht '5\' 3"'  (1.6 m)   Wt 135 lb (61.2 kg)   BMI 23.91 kg/m   Body mass index is 23.91 kg/m.  Advanced Directives 03/24/2020 02/12/2020 12/30/2019 08/11/2019 08/07/2019 05/26/2019 03/10/2019  Does Patient Have a Medical Advance Directive? No No No Yes Yes No No  Type of Advance Directive - - - Living will Living will - -  Would patient like information on creating a medical advance directive? Yes (MAU/Ambulatory/Procedural Areas - Information given) - No - Patient declined - - No - Patient declined No - Patient declined  Pre-existing out of facility DNR order (yellow form or pink MOST form) - - - - - - -   Tobacco Social History   Tobacco Use  Smoking Status Former Smoker  . Packs/day: 0.25  . Years: 39.00  . Pack years: 9.75  . Types: Cigarettes  . Start date: 04/16/1976  . Quit date: 02/13/2016  . Years since quitting: 4.1  Smokeless Tobacco Never Used  Tobacco Comment   4-5 cigs per day for 39 years     Counseling given: No plans to restart.  Clinical Intake:  Pre-visit preparation completed: Yes  Pain Score: 8   How often do you need to have someone help you when you read instructions, pamphlets, or other written materials from your doctor or pharmacy?: 3 - Sometimes What is the last grade level you completed in school?: 10th grade  Interpreter Needed?: No  Past Medical History:  Diagnosis Date  . Abdominal pain   . Anal fissure   .  Arthritis   . Asthma   . Carotid artery occlusion   . Chronic back pain   . Colon adenoma 2009, 2012   Colonoscopy  . Diverticulosis of colon (without mention of hemorrhage) 2009   Colonoscopy  . DM (diabetes mellitus) ([place]    off medicines for diabetes 12-18-13  . GERD (gastroesophageal reflux disease)   . Hemorrhoids   . Hernia   . HTN (hypertension)   . Hyperlipidemia    low  . Irritable bowel syndrome (IBS)   . Low blood potassium   . Obese   . [place]    x2  . Ulcer    Past Surgical History:  Procedure Laterality Date  . BUBBLE STUDY  03/03/2019   Procedure: BUBBLE STUDY;  Surgeon: HPixie Casino MD;  [facility];  Service: Cardiovascular;;  . COLONOSCOPY    . FLEXIBLE SIGMOIDOSCOPY N/A 07/14/2012   Procedure: FLEXIBLE SIGMOIDOSCOPY;  Surgeon: RInda Castle MD;  Location: [facility] ENDOSCOPY;  Service: Endoscopy;  Laterality: N/A;  . HEMORRHOID BANDING N/A 07/14/2012   Procedure: HEMORRHOID BANDING;  Surgeon: RInda Castle MD;  Location: [facility] ENDOSCOPY;  Service: Endoscopy;  Laterality: N/A;  . PARTIAL HYSTERECTOMY  1971  . POLYPECTOMY    . TEE WITHOUT CARDIOVERSION N/A 03/03/2019   Procedure: TRANSESOPHAGEAL ECHOCARDIOGRAM (TEE);  Surgeon: HPixie Casino MD;  [place]  Service: Cardiovascular;  Laterality: N/A;  . TONSILLECTOMY  Family History  Problem Relation Age of Onset  . Prostate cancer Brother   . Heart disease Mother   . Anuerysm Father   . Lung cancer Brother   . Colon cancer Brother   . Healthy Child   . Rectal cancer Neg Hx   . Stomach cancer Neg Hx   . Esophageal cancer Neg Hx    Social History   Socioeconomic History  . Marital status: Single    Spouse name: Not on file  . Number of children: 3  . Years of education: Not on file  . Highest education level: Not on file  Occupational History  . Occupation: UNEMPLOYED    Employer: UNEMPLOYED  Tobacco Use  . Smoking status: Former Smoker    Packs/day: 0.25     Years: 39.00    Pack years: 9.75    Types: Cigarettes    Start date: 04/16/1976    Quit date: 02/13/2016    Years since quitting: 4.1  . Smokeless tobacco: Never Used  . Tobacco comment: 4-5 cigs per day for 39 years  Vaping Use  . Vaping Use: Never used  Substance and Sexual Activity  . Alcohol use: Not Currently    Alcohol/week: 21.0 standard drinks    Types: 21 Cans of beer per week    Comment: quit 2 years ago  . Drug use: Yes    Types: Marijuana    Comment: using for depression// last used this morning 4am  . Sexual activity: Not Currently  Other Topics Concern  . Not on file  Social History Narrative   Patient lives alone in [place].    Patients daughter helps her a lot post stroke.    Patients daughter runs errands for her and takes her to doctors apt.    Patient enjoys seeing her family and reading the bible.    Social Determinants of Health   Financial Resource Strain: Low Risk   . Difficulty of Paying Living Expenses: Not hard at all  Food Insecurity: No Food Insecurity  . Worried About Charity fundraiser in the Last Year: Never true  . Ran Out of Food in the Last Year: Never true  Transportation Needs: No Transportation Needs  . Lack of Transportation (Medical): No  . Lack of Transportation (Non-Medical): No  Physical Activity: Inactive  . Days of Exercise per Week: 0 days  . Minutes of Exercise per Session: 0 min  Stress: No Stress Concern Present  . Feeling of Stress : Only a little  Social Connections: Moderately Isolated  . Frequency of Communication with Friends and Family: More than three times a week  . Frequency of Social Gatherings with Friends and Family: More than three times a week  . Attends Religious Services: More than 4 times per year  . Active Member of Clubs or Organizations: No  . Attends Archivist Meetings: Never  . Marital Status: Never married   Outpatient Encounter Medications as of 03/24/2020  Medication Sig  .  albuterol (PROVENTIL HFA;VENTOLIN HFA) 108 (90 Base) MCG/ACT inhaler Inhale 2 puffs into the lungs every 6 (six) hours as needed for wheezing or shortness of breath.  . Blood Glucose Calibration (ONETOUCH VERIO) High SOLN 1 each by In Vitro route as directed.  . Blood Glucose Calibration (ONETOUCH VERIO) SOLN 1 Bottle by In Vitro route as directed.  . Blood Glucose Monitoring Suppl (ONETOUCH VERIO) w/Device KIT 1 kit by Does not apply route 3 (three) times daily. ICD-10 code: E11.9  .  carvedilol (COREG) 3.125 MG tablet Take 1 tablet by mouth twice daily  . clopidogrel (PLAVIX) 75 MG tablet Take 1 tablet (75 mg total) by mouth daily. Please keep upcoming appt in April with Dr. Curt Bears before anymore refills. Thank you  . diclofenac Sodium (VOLTAREN) 1 % GEL Apply 1 application topically 3 (three) times daily as needed (back pain).  Marland Kitchen docusate sodium (COLACE) 100 MG capsule Take 1 capsule (100 mg total) by mouth every 12 (twelve) hours. (Patient taking differently: Take 100 mg by mouth 2 (two) times daily as needed (constipation).)  . empagliflozin (JARDIANCE) 10 MG TABS tablet Take 10 mg by mouth daily.  Marland Kitchen glucose blood (ONETOUCH VERIO) test strip Use as instructed to test glucose level three times daily. ICD-10 code: E11.9  . Lancet Devices (ONE TOUCH DELICA LANCING DEV) MISC 1 Device by Does not apply route as directed. ICD-10 code: E11.9  . losartan (COZAAR) 25 MG tablet Take 1 tablet (25 mg total) by mouth at bedtime.  . pantoprazole (PROTONIX) 40 MG tablet Take 1 tablet (40 mg total) by mouth daily.  . polyethylene glycol (MIRALAX / GLYCOLAX) 17 g packet Take 17 g by mouth daily.  . rosuvastatin (CRESTOR) 40 MG tablet Take 1 tablet (40 mg total) by mouth daily.  . traMADol (ULTRAM) 50 MG tablet Take 1 tablet (50 mg total) by mouth every 12 (twelve) hours as needed.  . ezetimibe (ZETIA) 10 MG tablet Take 1 tablet (10 mg total) by mouth daily.  . Ferrous Sulfate (IRON) 325 (65 Fe) MG TABS Take 1  tablet (325 mg total) by mouth 2 (two) times daily with a meal. (Patient not taking: Reported on 03/24/2020)  . metFORMIN (GLUCOPHAGE-XR) 500 MG 24 hr tablet Take 1 tablet (500 mg total) by mouth 2 (two) times daily with a meal.   No facility-administered encounter medications on file as of 03/24/2020.   Activities of Daily Living In your present state of health, do you have any difficulty performing the following activities: 03/24/2020  Hearing? Y  Vision? N  Difficulty concentrating or making decisions? Y  Walking or climbing stairs? Y  Dressing or bathing? N  Doing errands, shopping? Y  Preparing Food and eating ? N  Using the Toilet? N  In the past six months, have you accidently leaked urine? Y  Do you have problems with loss of bowel control? N  Managing your Medications? N  Managing your Finances? N  Housekeeping or managing your Housekeeping? Y  Some recent data might be hidden   Patient Care Team: Daisy Floro, DO as PCP - General (Family Medicine) Regal, Tamala Fothergill, DPM as Consulting Physician (Podiatry) Pieter Partridge, DO as Consulting Physician (Neurology)    Assessment:   This is a routine wellness examination for [place].  Exercise Activities and Dietary recommendations Current Exercise Habits: Structured exercise class, Exercise limited by: neurologic condition(s)  Goals    . HEMOGLOBIN A1C < 7     A1c 6.0 12/2019      Fall Risk Fall Risk  03/24/2020 02/12/2020 12/30/2019 08/11/2019 08/07/2019  Falls in the past year? 0 0 0 0 0  Number falls in past yr: 0 0 - - 0  Injury with Fall? 0 0 - - 0  Comment - - - - -  Risk for fall due to : - - - Impaired balance/gait;Impaired mobility History of fall(s)   Is the patient's home free of loose throw rugs in walkways, pet beds, electrical cords, etc?  yes      Grab bars in the bathroom? yes      Handrails on the stairs?   yes      Adequate lighting?   yes  Patient rating of health (0-10) scale: 5   Depression  Screen PHQ 2/9 Scores 03/24/2020 12/30/2019 08/11/2019 09/03/2018  PHQ - 2 Score 0 0 0 0  PHQ- 9 Score - 0 - -  Exception Documentation - - - -  Not completed - - - -    Cognitive Function 6CIT Screen 03/24/2020  What Year? 0 points  What month? 0 points  What time? 0 points  Count back from 20 2 points  Months in reverse 4 points  Repeat phrase 4 points  Total Score 10   Immunization History  Administered Date(s) Administered  . Influenza, High Dose Seasonal PF 01/29/2017  . Influenza,inj,Quad PF,6+ Mos 03/01/2016, 01/23/2018  . Influenza-Unspecified 11/14/2016, 01/14/2017  . PFIZER SARS-COV-2 Vaccination 12/30/2019  . Pneumococcal Conjugate-13 01/29/2017  . Tdap 08/11/2019   Screening Tests Health Maintenance  Topic Date Due  . OPHTHALMOLOGY EXAM  Never done  . COVID-19 Vaccine (2 - Pfizer 3-dose booster series) 01/20/2020  . INFLUENZA VACCINE  07/14/2020 (Originally 11/15/2019)  . HEMOGLOBIN A1C  06/28/2020  . FOOT EXAM  08/10/2020  . COLONOSCOPY  02/13/2021  . TETANUS/TDAP  08/10/2029  . DEXA SCAN  Completed  . Hepatitis C Screening  Completed  . PNA vac Low Risk Adult  Completed   Cancer Screenings: Lung: Low Dose CT Chest recommended if Age 61-80 years, 30 pack-year currently smoking OR have quit w/in 15years. Patient does not qualify. Breast:  Up to date on Mammogram? Yes   Up to date of Bone Density/Dexa? Yes Colorectal: UTD  Additional Screenings: Hepatitis C Screening: Completed  Plan:  Make a nurse visit for your second covid vaccine. Bring your card! FU with PCP in the new year.  Keep up the good work controlling your A1c.   I have personally reviewed and noted the following in the patient's chart:   . Medical and social history . Use of alcohol, tobacco or illicit drugs  . Current medications and supplements . Functional ability and status . Nutritional status . Physical activity . Advanced directives . List of other physicians . Hospitalizations,  surgeries, and ER visits in previous 12 months . Vitals . Screenings to include cognitive, depression, and falls . Referrals and appointments  In addition, I have reviewed and discussed with patient certain preventive protocols, quality metrics, and best practice recommendations. A written personalized care plan for preventive services as well as general preventive health recommendations were provided to patient.  This visit was conducted virtually in the setting of the [place] pandemic.    Dorna Bloom, [place]  03/24/2020   I have reviewed this visit and agree with the documentation.  Milus Banister, [place], PGY-3 03/26/2020 9:14 PM

## 2020-03-28 ENCOUNTER — Encounter: Payer: Self-pay | Admitting: Family Medicine

## 2020-03-31 DIAGNOSIS — M48061 Spinal stenosis, lumbar region without neurogenic claudication: Secondary | ICD-10-CM | POA: Diagnosis not present

## 2020-04-19 ENCOUNTER — Encounter (HOSPITAL_COMMUNITY): Payer: Medicare Other

## 2020-04-19 ENCOUNTER — Ambulatory Visit: Payer: Medicare Other | Admitting: Vascular Surgery

## 2020-04-26 ENCOUNTER — Other Ambulatory Visit: Payer: Self-pay | Admitting: Family Medicine

## 2020-04-26 DIAGNOSIS — L600 Ingrowing nail: Secondary | ICD-10-CM

## 2020-05-30 ENCOUNTER — Other Ambulatory Visit: Payer: Self-pay

## 2020-05-30 DIAGNOSIS — I739 Peripheral vascular disease, unspecified: Secondary | ICD-10-CM

## 2020-05-31 ENCOUNTER — Other Ambulatory Visit: Payer: Self-pay

## 2020-06-01 ENCOUNTER — Other Ambulatory Visit: Payer: Self-pay

## 2020-06-01 MED ORDER — METFORMIN HCL ER (MOD) 1000 MG PO TB24: 1000.0000 mg | ORAL_TABLET | Freq: Every day | ORAL | 3 refills | Status: DC

## 2020-06-01 MED ORDER — EMPAGLIFLOZIN 10 MG PO TABS: 10.0000 mg | ORAL_TABLET | Freq: Every day | ORAL | 3 refills | Status: AC

## 2020-06-07 ENCOUNTER — Telehealth: Payer: Self-pay

## 2020-06-07 ENCOUNTER — Other Ambulatory Visit: Payer: Self-pay | Admitting: Family Medicine

## 2020-06-07 NOTE — Telephone Encounter (Signed)
Patient's daughter calls nurse line regarding metformin and jardiance prescriptions. Daughter reports that they have been unable to pick up metformin. Called pharmacist. Per insurance, metformin 1000 is not covered. Preferred is Metformin 500 mg ER- Take two tablets by mouth daily.   Please advise if this is appropriate. If so, please send in new rx.   Spoke with patient's daughter. Per daughter, she would prefer to stay on the Metformin 500 mg- 1 tablet by mouth 2 times daily with meals, as this is what patient is used to and comfortable with.   Talbot Grumbling, RN

## 2020-06-09 ENCOUNTER — Other Ambulatory Visit: Payer: Self-pay | Admitting: Family Medicine

## 2020-06-09 DIAGNOSIS — E1159 Type 2 diabetes mellitus with other circulatory complications: Secondary | ICD-10-CM

## 2020-06-09 MED ORDER — METFORMIN HCL ER 500 MG PO TB24: 1000.0000 mg | ORAL_TABLET | Freq: Every day | ORAL | 3 refills | Status: AC

## 2020-06-09 NOTE — Telephone Encounter (Signed)
Daughter is calling again in regards to metformin.

## 2020-06-14 ENCOUNTER — Ambulatory Visit: Payer: Medicare Other | Admitting: Vascular Surgery

## 2020-06-14 ENCOUNTER — Inpatient Hospital Stay (HOSPITAL_COMMUNITY): Admission: RE | Admit: 2020-06-14 | Payer: Medicare Other | Source: Ambulatory Visit

## 2020-06-14 ENCOUNTER — Encounter (HOSPITAL_COMMUNITY): Payer: Medicare Other

## 2020-06-29 IMAGING — MR MRI HEAD WITHOUT CONTRAST
10 series · 39 of 48 positions shown · non-contrast
Comparison: Prior CT from 09/02/2018.

CLINICAL DATA: Initial evaluation for acute slurred speech, facial
weakness.

EXAM:
MRI HEAD WITHOUT CONTRAST
TECHNIQUE: Multiplanar, multiecho pulse sequences of the brain and surrounding
structures were obtained without intravenous contrast.

[Series 3: T1 · sagittal · 5.0mm · 0.47mm/px · 2 of 19 slices shown]
[im 1/19]
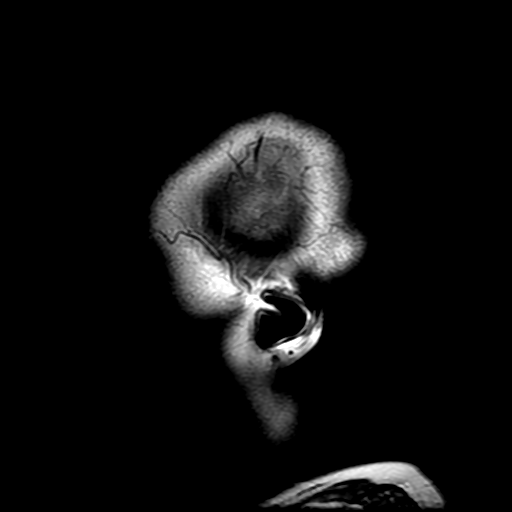
[im 19/19]
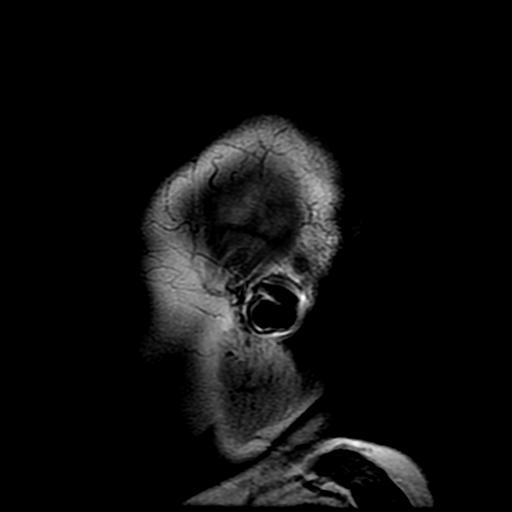

[Series 4: DWI · axial · 3.0mm · 1.09mm/px · z∈[-82,+44]mm · 9 of 92 slices shown (1 of 4)]
[im 1/92]
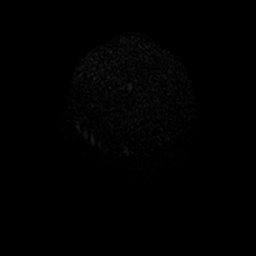
[im 12/92]
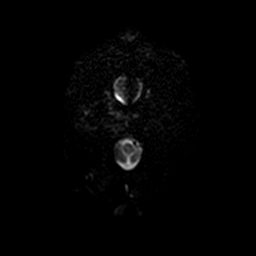
[im 23/92]
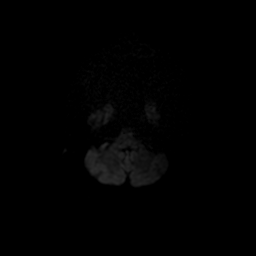
[im 35/92]
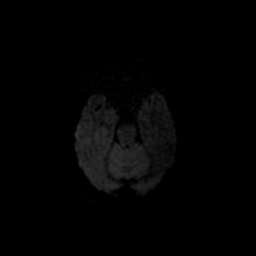
[im 46/92]
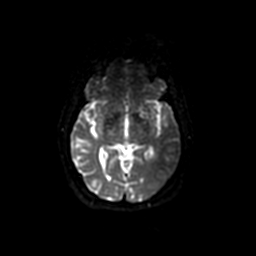
[im 57/92]
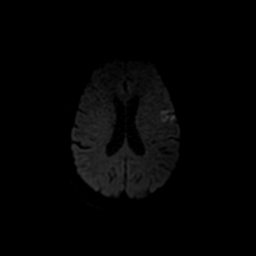
[im 69/92]
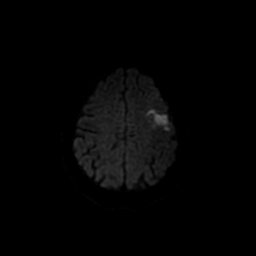
[im 80/92]
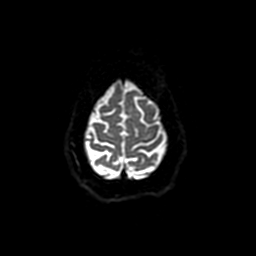
[im 92/92]
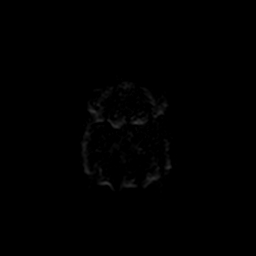

[Series 5: T2 · axial · 5.0mm · 0.86mm/px · z∈[-70,+53]mm · 2 of 23 slices shown (1 of 2)]
[im 1/23]
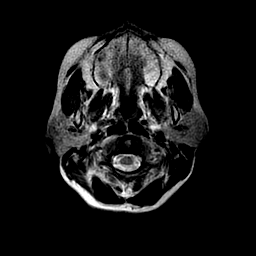
[im 23/23]
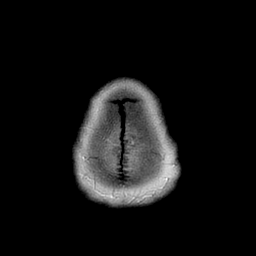

[Series 6: FLAIR · axial · 3.0mm · 0.86mm/px · z∈[-70,+53]mm · 2 of 23 slices shown]
[im 1/23]
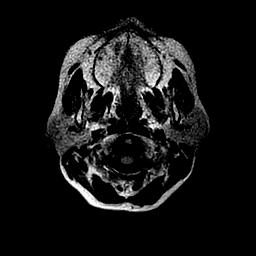
[im 23/23]
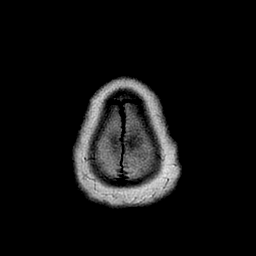

[Series 7: ax mpgr · axial · 3.0mm · 0.43mm/px · z∈[-69,+52]mm · 3 of 27 slices shown]
[im 1/27]
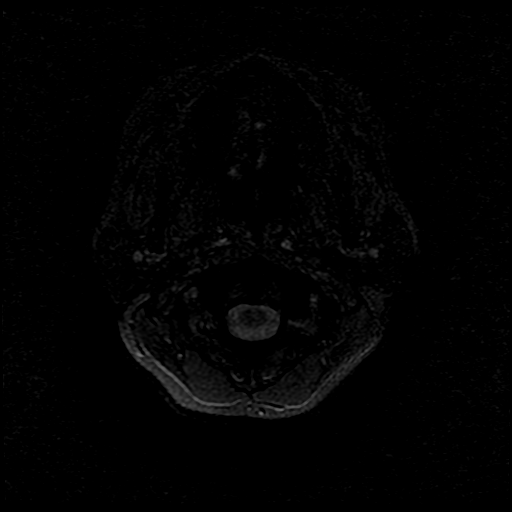
[im 14/27]
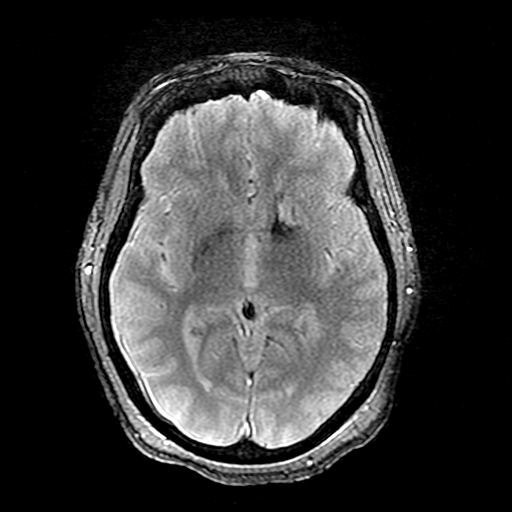
[im 27/27]
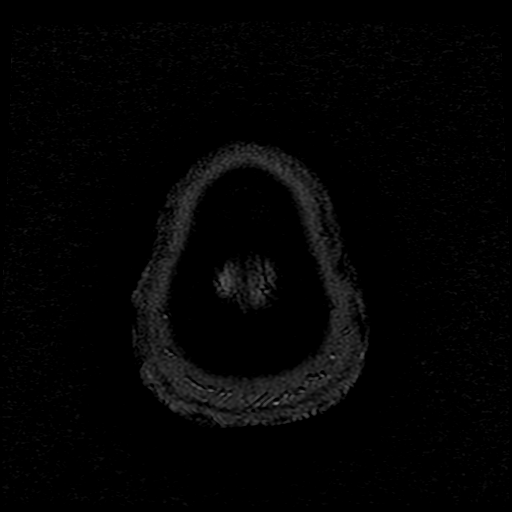

[Series 8: ax fspgr irp · axial · 1.0mm · 0.47mm/px · z∈[-72,-23]mm · 4 of 130 slices shown]
[im 1/130]
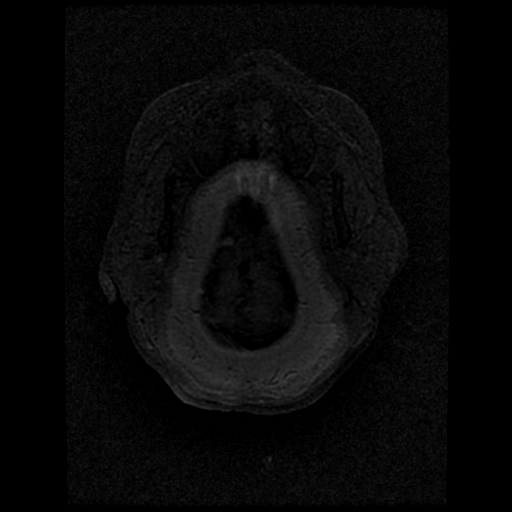
[im 22/130]
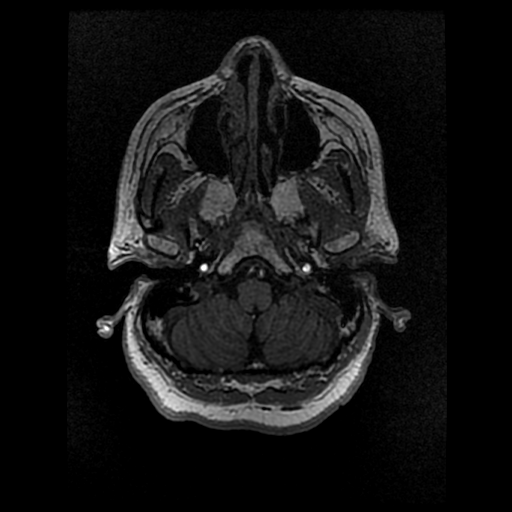
[im 44/130]
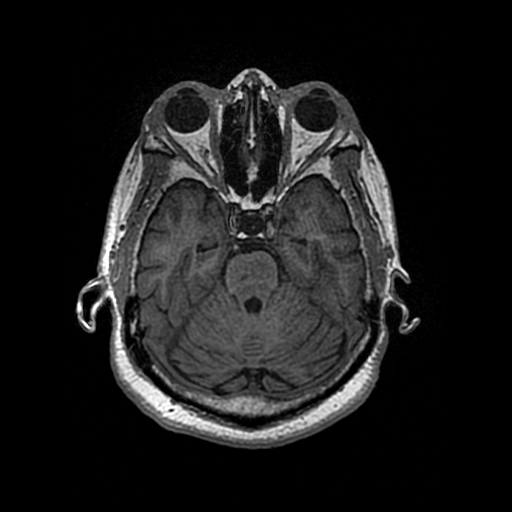
[im 54/130]
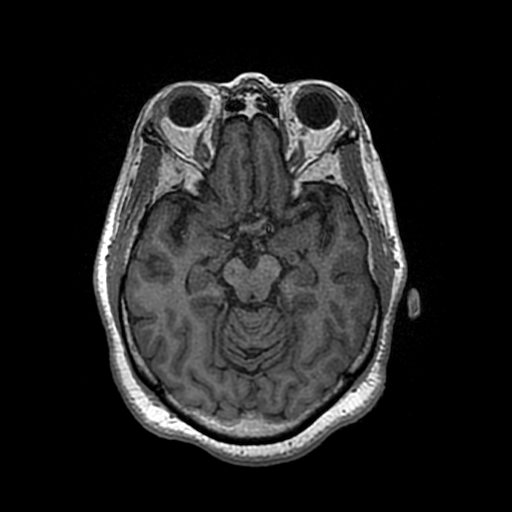

[Series 9: DWI · coronal · 4.0mm · 1.09mm/px · 7 of 76 slices shown (2 of 4)]
[im 1/76]
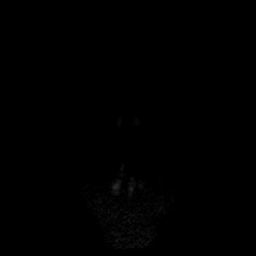
[im 13/76]
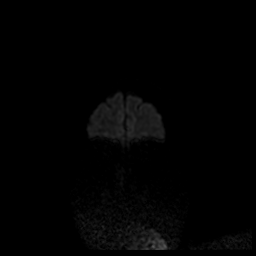
[im 26/76]
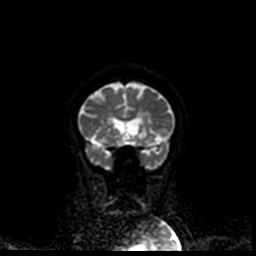
[im 38/76]
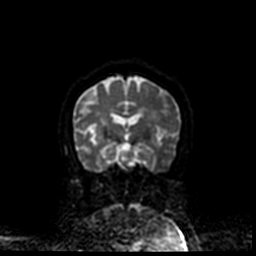
[im 51/76]
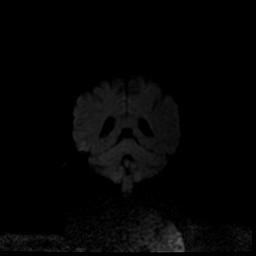
[im 63/76]
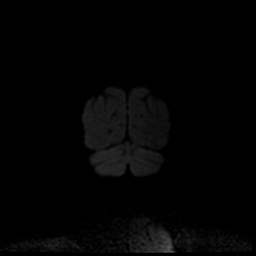
[im 76/76]
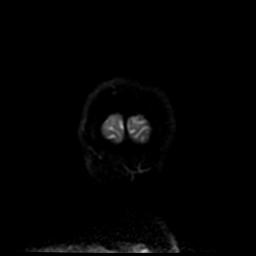

[Series 10: T2 · coronal · 5.0mm · 0.90mm/px · 2 of 24 slices shown (2 of 2)]
[im 1/24]
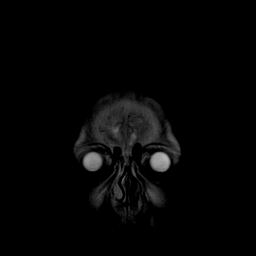
[im 24/24]
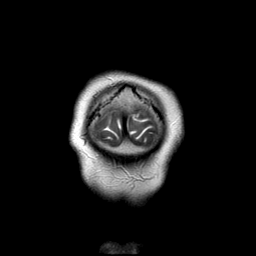

[Series 400: DWI · axial · 3.0mm · 1.09mm/px · z∈[-82,+44]mm · 4 of 46 slices shown (3 of 4)]
[im 1/46]
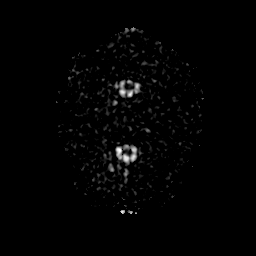
[im 16/46]
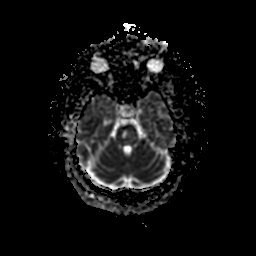
[im 31/46]
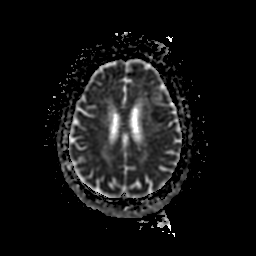
[im 46/46]
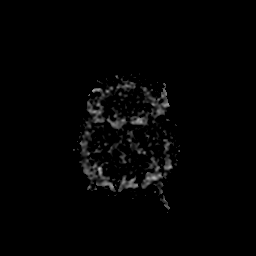

[Series 900: DWI · coronal · 4.0mm · 1.09mm/px · 4 of 38 slices shown (4 of 4)]
[im 1/38]
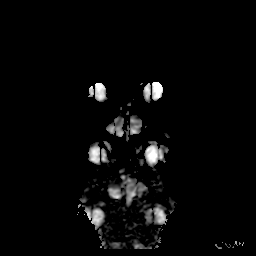
[im 13/38]
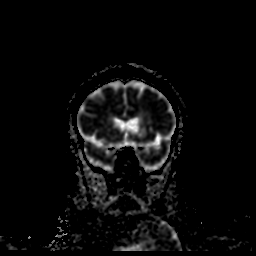
[im 25/38]
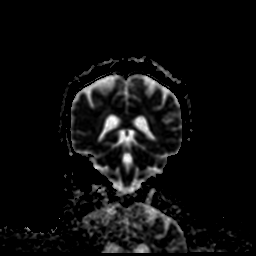
[im 38/38]
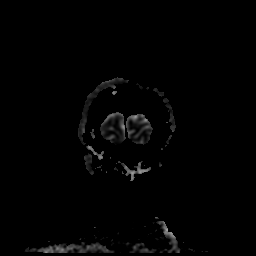

[39 of 48 positions shown; findings below may reference images not displayed]

FINDINGS: Brain: Generalized age-related cerebral atrophy. Patchy and
confluent T2/FLAIR hyperintensity within the periventricular and
deep white matter both cerebral hemispheres, moderate nature.
Superimposed remote lacunar infarcts present within the bilateral
basal ganglia, thalami, and pons. Small amount of chronic
hemosiderin staining noted at the left basal ganglia.

Patchy cortical and subcortical acute ischemic infarcts seen
involving the left frontal lobe, with patchy involvement of the left
frontal operculum (series 4, image 34). Mild subcortical involvement
more anteriorly within the anterior left frontal lobe (series 4,
image 32). No associated hemorrhage or mass effect. No other
evidence for acute or subacute ischemia. Gray-white matter
differentiation otherwise maintained. No other areas of remote
cortical infarction. No other evidence for acute or chronic
intracranial hemorrhage.

No mass lesion, midline shift or mass effect. Ventricles normal size
without hydrocephalus. No extra-axial fluid collection.

Pituitary gland suprasellar region within normal limits. Midline
structures intact.

Vascular: Major intracranial vascular flow voids are maintained.

Skull and upper cervical spine: Craniocervical junction within
normal limits. Multi level cervical spondylolysis throughout the
upper cervical spine without significant stenosis. Bone marrow
signal intensity normal. No scalp soft tissue abnormality.

Sinuses/Orbits: Patient status post bilateral ocular lens
replacement. Paranasal sinuses are largely clear. No significant
mastoid effusion. Inner ear structures grossly normal.

Other: None.
IMPRESSION: 1. Patchy small volume cortical and subcortical acute ischemic
infarcts involving the anterior left frontal lobe as above. No
associated hemorrhage or mass effect.
2. Underlying moderate chronic microvascular ischemic disease with
multiple remote lacunar infarcts involving the bilateral basal
ganglia, thalami, and pons.

## 2020-07-19 ENCOUNTER — Ambulatory Visit (HOSPITAL_COMMUNITY)
Admission: RE | Admit: 2020-07-19 | Discharge: 2020-07-19 | Disposition: A | Discharge status: Home / Self Care | Payer: Medicare Other | Source: Ambulatory Visit | Attending: Vascular Surgery | Admitting: Vascular Surgery

## 2020-07-19 ENCOUNTER — Other Ambulatory Visit: Payer: Self-pay

## 2020-07-19 ENCOUNTER — Ambulatory Visit (INDEPENDENT_AMBULATORY_CARE_PROVIDER_SITE_OTHER)
Admission: RE | Admit: 2020-07-19 | Discharge: 2020-07-19 | Disposition: A | Discharge status: Home / Self Care | Payer: Medicare Other | Source: Ambulatory Visit | Attending: Vascular Surgery | Admitting: Vascular Surgery

## 2020-07-19 ENCOUNTER — Ambulatory Visit: Payer: Medicare Other | Admitting: Vascular Surgery

## 2020-07-19 DIAGNOSIS — I6523 Occlusion and stenosis of bilateral carotid arteries: Secondary | ICD-10-CM

## 2020-07-19 DIAGNOSIS — I739 Peripheral vascular disease, unspecified: Secondary | ICD-10-CM | POA: Diagnosis not present

## 2020-07-26 NOTE — Progress Notes (Deleted)
NEUROLOGY FOLLOW UP OFFICE NOTE  Margaret Rangel [id number]  Assessment/Plan:   1.  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  Bilateral ICA stenosis (40% on left symptomatic side, 60% right asymptomatic side) 4.  Hyperlipidemia 5.  Type 2 diabetes mellitus 6.  Hypertension  Secondary stroke prevention as managed by her other providers: - ASA 349m daily - Crestor and Zetia.  LDL goal less than 70 - Glycemic control.  Hgb A1c goal less than 7 - Blood pressure control  Subjective:  Margaret Rangel a 89ear oldright-handed black woman with asthma, hypertension, hyperlipidemia, IBS, type 2 diabetes mellitus, chronic back pain and history of strokeswho follows up for headache and stroke.  She is accompanied by her daughter who supplements history. Vascular surgery note reviewed.  UPDATE: Current medications: ASA 835m Plavix; Crestor 4051metia, Coreg, glimepiride, tramadol  Repeat carotid ultrasound on 07/19/2020 showed 40-59% left ICA and 1-39% right ICA stenosis.  ***  HISTORY: In May 2020, she developed a left-sided headache with associated slurred speech. There was no associated visual disturbance, unsteady gait, dizziness, or unilateral numbness or weakness. She didn't seek immediate medical attention. Over the next few days, symptoms gradually improved. She had a virtual visit with her PCP 5 days later, on 09/02/18, who told her to go to the ED. CT of head personally reviewed showed hypodensity in the left frontal lobe suspicious for acute infarct as well as age-indeterminate strokes in the bilateral basal ganglia. MRI of brain without contrast showed multiple small acute strokes in the left frontal region as well as chronic small vessel ischemic changes and remote lacunar infarcts involving both basal ganglia, thalami and pons. Hospital  admission was recommended but patient and her family declined. Labs from 09/03/18 showed lipid panel with total cholesterol 276, TG 378, HDL 41 and LDL 159. Hgb A1c was 7.4%. She later had an outpatient carotid doppler on 09/17/18 which demonstrated 40-59% stenosis in the right internal carotid artery and 1-39% stenosis in the left internal carotid artery as well as over 50% stenosis in the right ECA, left CCA and left ECA. 2D echocardiogram from 09/19/18 demonstrated LVEF 60-65% with no atrial level shunt detected by color flow Doppler. Cardiology is now discussing Linq monitoring.  She had not been on antiplatelet therapy due to GI bleed in 2014.Since this recent stroke, she has started ASA 57m39mily. She was restarted on Crestor (previously stopped it).  She started having the headaches daily in October 2020.[facility] after presenting with dizziness and headache. She did not receive IV tPA due to late presentation. MRI of brain without contrast showed late subacute infarct in the left MCA/PCA watershed zone at the medial left parietooccipital junction, likely several weeks old. CTA of head and neck showed bilateral ICA atherosclerotic disease with 60% stenosis at right ICA bulb, 40% stenosis at left ICA bulb, 50% stenosis f left CCA, right VA 50% stenosis and left VA 70% stenosis. 2D echo and TEE showed EF 60-65% with no cardiac source of emboli. She was discharged on dual antiplatelet therapy (ASA 325mg49m Plavix 75mg)57m 3 months, followed by ASA 325mg a9m.  In November 2021, she endorsed episodes of severe pressure-likeheadache on top of head and left side of faceassociated with dizziness and unsteady gait lasting a few seconds.It occurs 1 to 2 times a day. She was  started on gabapentin 133m but it made her feel funny so she stopped. She followed up with vascular surgery who determined no indication for surgical intervention for left ICA  stenosis of 40%.  She has prior history of stroke with stroke risk factors including hypertension, hyperlipidemia, type 2 diabetes mellitus, former cigarette smoker.  PAST MEDICAL HISTORY: Past Medical History:  Diagnosis Date  . Abdominal pain   . Anal fissure   . Arthritis   . Asthma   . Carotid artery occlusion   . Chronic back pain   . Colon adenoma 2009, 2012   Colonoscopy  . Diverticulosis of colon (without mention of hemorrhage) 2009   Colonoscopy  . DM (diabetes mellitus) ([place]    off medicines for diabetes 12-18-13  . GERD (gastroesophageal reflux disease)   . Hemorrhoids   . Hernia   . HTN (hypertension)   . Hyperlipidemia    low  . Irritable bowel syndrome (IBS)   . Low blood potassium   . Obese   . Stroke ([place]    x2  . Ulcer     MEDICATIONS: Current Outpatient Medications on File Prior to Visit  Medication Sig Dispense Refill  . albuterol (PROVENTIL HFA;VENTOLIN HFA) 108 (90 Base) MCG/ACT inhaler Inhale 2 puffs into the lungs every 6 (six) hours as needed for wheezing or shortness of breath. 1 Inhaler 2  . Blood Glucose Calibration (ONETOUCH VERIO) High SOLN 1 each by In Vitro route as directed. 1 each 6  . Blood Glucose Calibration (ONETOUCH VERIO) SOLN 1 Bottle by In Vitro route as directed. 1 each 6  . Blood Glucose Monitoring Suppl (ONETOUCH VERIO) w/Device KIT 1 kit by Does not apply route 3 (three) times daily. ICD-10 code: E11.9 1 kit 0  . carvedilol (COREG) 3.125 MG tablet Take 1 tablet by mouth twice daily 180 tablet 0  . clopidogrel (PLAVIX) 75 MG tablet Take 1 tablet (75 mg total) by mouth daily. Please keep upcoming appt in April with Dr. CCurt Bearsbefore anymore refills. Thank you 90 tablet 0  . diclofenac Sodium (VOLTAREN) 1 % GEL Apply 1 application topically 3 (three) times daily as needed (back pain).    .Marland Kitchendocusate sodium (COLACE) 100 MG capsule Take 1 capsule (100 mg total) by mouth every 12 (twelve) hours. (Patient taking differently: Take 100  mg by mouth 2 (two) times daily as needed (constipation).) 60 capsule 0  . empagliflozin (JARDIANCE) 10 MG TABS tablet Take 1 tablet (10 mg total) by mouth daily. 90 tablet 3  . ezetimibe (ZETIA) 10 MG tablet Take 1 tablet (10 mg total) by mouth daily. 90 tablet 3  . Ferrous Sulfate (IRON) 325 (65 Fe) MG TABS Take 1 tablet (325 mg total) by mouth 2 (two) times daily with a meal. (Patient not taking: Reported on 03/24/2020) 90 tablet 0  . glucose blood (ONETOUCH VERIO) test strip Use as instructed to test glucose level three times daily. ICD-10 code: E11.9 100 each 12  . Lancet Devices (ONE TOUCH DELICA LANCING DEV) MISC 1 Device by Does not apply route as directed. ICD-10 code: E11.9 1 each 0  . losartan (COZAAR) 25 MG tablet Take 1 tablet (25 mg total) by mouth at bedtime. 90 tablet 3  . metFORMIN (GLUCOPHAGE-XR) 500 MG 24 hr tablet Take 2 tablets (1,000 mg total) by mouth daily with breakfast. 180 tablet 3  . pantoprazole (PROTONIX) 40 MG tablet Take 1 tablet by mouth once daily 90 tablet 0  . polyethylene glycol (MIRALAX /  GLYCOLAX) 17 g packet Take 17 g by mouth daily. 14 each 0  . rosuvastatin (CRESTOR) 40 MG tablet Take 1 tablet (40 mg total) by mouth daily. 90 tablet 3  . traMADol (ULTRAM) 50 MG tablet Take 1 tablet (50 mg total) by mouth every 12 (twelve) hours as needed. 20 tablet 0   No current facility-administered medications on file prior to visit.    ALLERGIES: Allergies  Allergen Reactions  . Codeine Itching  . Penicillins Itching    Has patient had a PCN reaction causing immediate rash, facial/tongue/throat swelling, SOB or lightheadedness with hypotension:No Has patient had a PCN reaction causing severe rash involving mucus membranes or skin necrosis:No Has patient had a PCN reaction that required hopititalization:No Has patient had a PCN reaction occurring within the last 10 years:No If all of the above answers are "NO", then may proceed with Cephalosporin use.      FAMILY HISTORY: Family History  Problem Relation Age of Onset  . Prostate cancer Brother   . Heart disease Mother   . Anuerysm Father   . Lung cancer Brother   . Colon cancer Brother   . Healthy Child   . Rectal cancer Neg Hx   . Stomach cancer Neg Hx   . Esophageal cancer Neg Hx       Objective:  *** General: No acute distress.  Patient appears ***-groomed.   Head:  Normocephalic/atraumatic Eyes:  Fundi examined but not visualized Neck: supple, no paraspinal tenderness, full range of motion Heart:  Regular rate and rhythm Lungs:  Clear to auscultation bilaterally Back: No paraspinal tenderness Neurological Exam: alert and oriented to person, place, and time. Attention span and concentration intact, recent and remote memory intact, fund of knowledge intact.  Speech fluent and not dysarthric, language intact.  CN II-XII intact. Bulk and tone normal, muscle strength 5/5 throughout.  Sensation to light touch, temperature and vibration intact.  Deep tendon reflexes 2+ throughout, toes downgoing.  Finger to nose and heel to shin testing intact.  Gait normal, Romberg negative.     Margaret Clines, DO  CC:  Milus Banister, DO

## 2020-07-28 ENCOUNTER — Ambulatory Visit: Payer: Medicare Other | Admitting: Neurology

## 2020-08-03 ENCOUNTER — Ambulatory Visit: Payer: Medicare Other | Admitting: Family Medicine

## 2020-08-09 ENCOUNTER — Ambulatory Visit: Payer: Medicare Other | Admitting: Vascular Surgery

## 2020-08-15 DIAGNOSIS — Z538 Procedure and treatment not carried out for other reasons: Secondary | ICD-10-CM

## 2020-08-15 NOTE — Progress Notes (Signed)
[facility])                                            [place] Team                                        Statin Quality Measure Assessment    08/15/2020  [name] 09-03-1945 [id number]  Per review of chart and payor information, this patient has been flagged for non-adherence to the following CMS Quality Measure:   [x]  Statin Use in Persons with Diabetes  []  Statin Use in Persons with Cardiovascular Disease  The ASCVD Risk score [name] DC Jr., et al., 2013) failed to calculate for the following reasons:   The patient has a prior MI or stroke diagnosis    Currently prescribed statin:  []  Yes [x]  No     Comments:rosuvastatin 40 mg QD - last filled 08/30/2019. Per recent PCP appointment, this medication was stopped by cardiology. However, it is unclear the reason for discontinuing rosuvastatin.   Please consider ONE of the following recommendations:   Initiate high intensity statin Atorvastatin 40mg  once daily, #90, 3 refills   Rosuvastatin 20mg  once daily, #90, 3 refills    Initiate moderate intensity          statin with reduced frequency if prior          statin intolerance 1x weekly, #13, 3 refills   2x weekly, #26, 3 refills   3x weekly, #39, 3 refills    Code for past statin intolerance or other exclusions (required annually)  Drug Induced Myopathy G72.0   Myositis, unspecified M60.9   Rhabdomyolysis M62.82   Prediabetes R73.03   Adverse effect of antihyperlipidemic and antiarteriosclerotic drugs, initial encounter P61.9J0D    Thank you for your time,  [name], [place] Cell: [phone]

## 2020-08-16 NOTE — Progress Notes (Signed)
SUBJECTIVE:   CHIEF COMPLAINT / HPI:   HLD: patient with history of elevated triglycerides at 315 (April 2021), LDL normal at 54. Patient has a history of CVA and would benefit from moderate-high intensity statin.  Previously patient reports that her Crestor 40 mg has been discontinued by another physician and started on Zetia 10 mg daily.  Patient reports she is taking her Crestor every day.  I asked the patient if she would mind doing a repeat lipid panel at today's visit to check effectiveness of current regimen, patient respectfully declines.  T2DM: Patient's A1c has gone from 6.0% up to 6.4% today.  Patient reports compliance with her metformin 1000 mg daily and Jardiance 10 mg daily.  Due for Eye exam, foot exam.  Medication reconciliation: Patient reports that she takes 5 tablets every day.  Per her medication list review patient is supposed to take 7 tablets at least once daily, and 2 other tablets (Protonix and iron supplement) also at least once daily.  Patient was given a list of her medications and what time of day she is supposed to take the meds.  She was asked to please bring in her medications to every appointment for reconciliation.  Health maintenance: would benefit from COVID Vaccine - patient declines.  Patient would also benefit from foot exam and diabetic eye exam.  PERTINENT  PMH / PSH:  Patient Active Problem List   Diagnosis Date Noted  . Encounter for medication review 08/21/2020  . Encounter for immunization 12/30/2019  . Peripheral artery disease ([place]) 12/30/2019  . Ingrown left big toenail 08/25/2019  . History of CVA (cerebrovascular accident) 02/27/2019  . Hyperlipidemia   . Chronic back pain 01/26/2019  . Osteoarthritis of both knees 01/23/2018  . GAD (generalized anxiety disorder) 11/22/2016  . Seasonal allergies 11/22/2016  . Asthma, chronic 11/22/2016  . History of stroke 09/13/2016  . Diverticulosis of colon 09/13/2016  . Osteoporosis 05/01/2016   . Systolic murmur 31/54/0086  . Essential hypertension 04/02/2016  . Chest pain with moderate risk for cardiac etiology 03/07/2016  . History of esophageal stricture 03/01/2016  . Esophageal reflux 02/02/2014  . Personal history of colonic polyps 12/09/2013  . Dysphagia, unspecified(787.20) 09/11/2012  . DM (diabetes mellitus) ([place]) 12/22/2010    OBJECTIVE:   BP 140/60   Pulse 81   Wt 144 lb 6 oz (65.5 kg)   SpO2 98%   BMI 25.57 kg/m    Physical exam: General: Well-appearing patient, non toxic appearing, no acute distress Respiratory: Comfortable work of breathing on room air, CTA bilaterally  ASSESSMENT/PLAN:   DM (diabetes mellitus) (HCC) A1c stable today from 6.0% up to 6.4%.  Patient has remained compliant with strict diet and lifestyle modifications. -Continue lifestyle modifications -Continue metformin 500 mg twice daily, continue Jardiance 10 mg daily -Patient due for foot exam, unable to complete at this visit. Also due for Eye exam.  -Recommend patient bring all medications to her next follow-up appointment for review and organization  Encounter for medication review Patient was confused with her medication regimen. She said she was only taking 5 pills every day. Medication list was reviewed and patient given list of medications and when she was supposed to take them.  Medicine                                            AM  PM Carvedilol/Coreg 3.125mg  (Blood pressure)     X     X Empagliflozin/Jardiance 10mg  (Diabetes)     X  - - -   Ezetimibe/Zetia (10mg ) (Cholesterol)      X  - - - Aspirin 81mg  (Baby Aspirin, blood thinner)        X  - - - Losartan/Cozaar 25mg  (Blood pressure)        - - -                   X Metformin 1000mg /2 tabs (Diabetes)     X     X Crestor/Rosuvastatin 40mg  (Cholesterol)    - - -     X ________________________________________________________  Pantoprazole/Protonix (Acid reflux)     X  - - - Ferrous Sulfate/Iron 325mg  (low  blood)    X  - - - -Asked patient to please bring all medications to next office visit for thorough reconciliation  Hyperlipidemia Patient with history of previous CVA.  Last lipid panel April 2021 shows slightly elevated triglyceride at 315, HDL 52 and LDL low at 54.  Patient endorses compliance with Crestor 40 mg daily as well as Zetia 10 mg daily.  Patient respectfully declines repeat lipid panel at today's visit.  She denies any concerns with muscle aches, pains. -Patient encouraged to continue Zetia 10 mg daily and Crestor 40 mg daily -Please contact us if myalgias/muscle aches/body aches occur -Consider repeat lipid panel at next visit -Asked patient to please bring in all medications for review at next visit     Margaret Rangel, [place]

## 2020-08-17 ENCOUNTER — Ambulatory Visit (INDEPENDENT_AMBULATORY_CARE_PROVIDER_SITE_OTHER): Payer: Medicare Other | Admitting: Family Medicine

## 2020-08-17 ENCOUNTER — Other Ambulatory Visit: Payer: Self-pay

## 2020-08-17 VITALS — BP 140/60 | HR 81 | Wt 144.4 lb

## 2020-08-17 DIAGNOSIS — E1159 Type 2 diabetes mellitus with other circulatory complications: Secondary | ICD-10-CM

## 2020-08-17 DIAGNOSIS — E782 Mixed hyperlipidemia: Secondary | ICD-10-CM

## 2020-08-17 DIAGNOSIS — Z79899 Other long term (current) drug therapy: Secondary | ICD-10-CM | POA: Diagnosis not present

## 2020-08-17 LAB — POCT GLYCOSYLATED HEMOGLOBIN (HGB A1C): HbA1c, POC (controlled diabetic range): 6.4 % (ref 0.0–7.0)

## 2020-08-17 NOTE — Patient Instructions (Signed)
Thank you for coming in to see [place] today! Please see below to review our plan for today's visit:  1. Please consider your COVID-19 vaccine. You are also due for your annual eye exam. Please call your optometrist/ophthalmologist to schedule this diabetic eye exam.  2. Take the following medications every day Medicine                                    Morning Evening Carvedilol/Coreg 3.125mg  (Blood pressure)     X     X Empagliflozin/Jardiance 10mg  (Diabetes)     X  - - -   Ezetimibe/Zetia (10mg ) (Cholesterol)      X  - - - Aspirin 81mg  (Baby Aspirin, blood thinner)        X  - - - Losartan/Cozaar 25mg  (Blood pressure)        - - -                   X Metformin 1000mg /2 tabs (Diabetes)     X     X Crestor/Rosuvastatin 40mg  (Cholesterol)    - - -     X ________________________________________________________  Pantoprazole/Protonix (Acid reflux)     X  - - - Ferrous Sulfate/Iron 325mg  (low blood)    X  - - -   Please call the clinic at [phone] if your symptoms worsen or you have any concerns. It was our pleasure to serve you!   Dr. Milus Banister [facility]

## 2020-08-21 DIAGNOSIS — Z79899 Other long term (current) drug therapy: Secondary | ICD-10-CM | POA: Insufficient documentation

## 2020-08-21 NOTE — Assessment & Plan Note (Addendum)
Patient was confused with her medication regimen. She said she was only taking 5 pills every day. Medication list was reviewed and patient given list of medications and when she was supposed to take them.  Medicine                                            AM            PM Carvedilol/Coreg 3.125mg  (Blood pressure)     X     X Empagliflozin/Jardiance 10mg  (Diabetes)     X  - - -   Ezetimibe/Zetia (10mg ) (Cholesterol)      X  - - - Aspirin 81mg  (Baby Aspirin, blood thinner)        X  - - - Losartan/Cozaar 25mg  (Blood pressure)        - - -                   X Metformin 1000mg /2 tabs (Diabetes)     X     X Crestor/Rosuvastatin 40mg  (Cholesterol)    - - -     X ________________________________________________________  Pantoprazole/Protonix (Acid reflux)     X  - - - Ferrous Sulfate/Iron 325mg  (low blood)    X  - - - -Asked patient to please bring all medications to next office visit for thorough reconciliation

## 2020-08-21 NOTE — Assessment & Plan Note (Addendum)
Patient with history of previous CVA.  Last lipid panel April 2021 shows slightly elevated triglyceride at 315, HDL 52 and LDL low at 54.  Patient endorses compliance with Crestor 40 mg daily as well as Zetia 10 mg daily.  Patient respectfully declines repeat lipid panel at today's visit.  She denies any concerns with muscle aches, pains. -Patient encouraged to continue Zetia 10 mg daily and Crestor 40 mg daily -Please contact us if myalgias/muscle aches/body aches occur -Consider repeat lipid panel at next visit -Asked patient to please bring in all medications for review at next visit

## 2020-08-21 NOTE — Assessment & Plan Note (Addendum)
A1c stable today from 6.0% up to 6.4%.  Patient has remained compliant with strict diet and lifestyle modifications. -Continue lifestyle modifications -Continue metformin 500 mg twice daily, continue Jardiance 10 mg daily -Patient due for foot exam, unable to complete at this visit. Also due for Eye exam.  -Recommend patient bring all medications to her next follow-up appointment for review and organization

## 2020-08-29 DIAGNOSIS — M48061 Spinal stenosis, lumbar region without neurogenic claudication: Secondary | ICD-10-CM | POA: Diagnosis not present

## 2020-08-30 ENCOUNTER — Ambulatory Visit: Payer: Medicare Other | Admitting: Vascular Surgery

## 2020-09-01 ENCOUNTER — Other Ambulatory Visit: Payer: Self-pay | Admitting: Family Medicine

## 2020-09-01 DIAGNOSIS — L600 Ingrowing nail: Secondary | ICD-10-CM

## 2020-09-02 ENCOUNTER — Encounter: Payer: Self-pay | Admitting: Family Medicine

## 2020-09-02 ENCOUNTER — Other Ambulatory Visit: Payer: Self-pay | Admitting: Family Medicine

## 2020-09-02 DIAGNOSIS — L600 Ingrowing nail: Secondary | ICD-10-CM

## 2020-09-02 NOTE — Telephone Encounter (Signed)
Patients daughter calls nurse line reporting a fall at her recent doctors apt. Daughter reports she fell and hit her knee, therefore has been in 10/10 pain since. Please advise on refill for the weekend.

## 2020-09-03 MED ORDER — TRAMADOL HCL 50 MG PO TABS: 50.0000 mg | ORAL_TABLET | Freq: Two times a day (BID) | ORAL | 0 refills | Status: DC | PRN

## 2020-09-05 MED ORDER — ROSUVASTATIN CALCIUM 40 MG PO TABS: 40.0000 mg | ORAL_TABLET | Freq: Every day | ORAL | 3 refills | Status: AC

## 2020-09-07 ENCOUNTER — Encounter: Payer: Self-pay | Admitting: Family Medicine

## 2020-09-07 ENCOUNTER — Other Ambulatory Visit: Payer: Self-pay | Admitting: Family Medicine

## 2020-09-07 DIAGNOSIS — I10 Essential (primary) hypertension: Secondary | ICD-10-CM

## 2020-09-07 DIAGNOSIS — E0800 Diabetes mellitus due to underlying condition with hyperosmolarity without nonketotic hyperglycemic-hyperosmolar coma (NKHHC): Secondary | ICD-10-CM

## 2020-09-08 ENCOUNTER — Other Ambulatory Visit: Payer: Self-pay | Admitting: Family Medicine

## 2020-09-09 ENCOUNTER — Other Ambulatory Visit: Payer: Self-pay | Admitting: Family Medicine

## 2020-09-09 DIAGNOSIS — L600 Ingrowing nail: Secondary | ICD-10-CM

## 2020-09-13 ENCOUNTER — Ambulatory Visit: Payer: Medicare Other | Admitting: Vascular Surgery

## 2020-09-27 ENCOUNTER — Other Ambulatory Visit: Payer: Self-pay | Admitting: Family Medicine

## 2020-09-27 DIAGNOSIS — M5416 Radiculopathy, lumbar region: Secondary | ICD-10-CM | POA: Diagnosis not present

## 2020-09-27 DIAGNOSIS — L600 Ingrowing nail: Secondary | ICD-10-CM

## 2020-09-27 MED ORDER — TRAMADOL HCL 50 MG PO TABS: 50.0000 mg | ORAL_TABLET | Freq: Two times a day (BID) | ORAL | 0 refills | Status: DC | PRN

## 2020-09-28 ENCOUNTER — Telehealth: Payer: Self-pay

## 2020-09-28 ENCOUNTER — Other Ambulatory Visit: Payer: Self-pay | Admitting: Family Medicine

## 2020-09-28 DIAGNOSIS — L600 Ingrowing nail: Secondary | ICD-10-CM

## 2020-09-28 MED ORDER — TRAMADOL HCL 50 MG PO TABS: 50.0000 mg | ORAL_TABLET | Freq: Two times a day (BID) | ORAL | 0 refills | Status: DC | PRN

## 2020-09-28 NOTE — Telephone Encounter (Signed)
[facility] has no stock of Tramadol. I have called and cancelled prescription with them for #20 tabs. Daughter would like resent to [facility] on [address]. I have pended this pharmacy to the medication.

## 2020-10-03 ENCOUNTER — Other Ambulatory Visit: Payer: Self-pay | Admitting: Pain Medicine

## 2020-10-03 DIAGNOSIS — M5416 Radiculopathy, lumbar region: Secondary | ICD-10-CM

## 2020-10-19 ENCOUNTER — Other Ambulatory Visit: Payer: Self-pay

## 2020-10-19 DIAGNOSIS — L600 Ingrowing nail: Secondary | ICD-10-CM

## 2020-10-19 MED ORDER — TRAMADOL HCL 50 MG PO TABS: 50.0000 mg | ORAL_TABLET | Freq: Two times a day (BID) | ORAL | 0 refills | Status: AC | PRN

## 2020-10-29 ENCOUNTER — Other Ambulatory Visit: Payer: Medicare Other

## 2020-11-01 ENCOUNTER — Ambulatory Visit: Payer: Medicare Other | Admitting: Vascular Surgery

## 2020-11-02 ENCOUNTER — Other Ambulatory Visit: Payer: Medicare Other

## 2020-11-08 DIAGNOSIS — M5416 Radiculopathy, lumbar region: Secondary | ICD-10-CM | POA: Diagnosis not present

## 2020-11-08 DIAGNOSIS — G894 Chronic pain syndrome: Secondary | ICD-10-CM | POA: Diagnosis not present

## 2020-11-18 ENCOUNTER — Encounter (HOSPITAL_COMMUNITY): Payer: Self-pay

## 2020-11-18 ENCOUNTER — Emergency Department (HOSPITAL_COMMUNITY): Payer: Medicare Other

## 2020-11-18 ENCOUNTER — Inpatient Hospital Stay (HOSPITAL_COMMUNITY)
Admission: EM | Admit: 2020-11-18 | Discharge: 2020-12-15 | DRG: 100 | Disposition: E | Payer: Medicare Other | Attending: Pulmonary Disease | Admitting: Pulmonary Disease

## 2020-11-18 DIAGNOSIS — D8989 Other specified disorders involving the immune mechanism, not elsewhere classified: Secondary | ICD-10-CM | POA: Diagnosis present

## 2020-11-18 DIAGNOSIS — E876 Hypokalemia: Secondary | ICD-10-CM | POA: Diagnosis not present

## 2020-11-18 DIAGNOSIS — D65 Disseminated intravascular coagulation [defibrination syndrome]: Secondary | ICD-10-CM | POA: Diagnosis not present

## 2020-11-18 DIAGNOSIS — R404 Transient alteration of awareness: Secondary | ICD-10-CM | POA: Diagnosis not present

## 2020-11-18 DIAGNOSIS — E1151 Type 2 diabetes mellitus with diabetic peripheral angiopathy without gangrene: Secondary | ICD-10-CM | POA: Diagnosis present

## 2020-11-18 DIAGNOSIS — I4891 Unspecified atrial fibrillation: Secondary | ICD-10-CM | POA: Diagnosis not present

## 2020-11-18 DIAGNOSIS — G40901 Epilepsy, unspecified, not intractable, with status epilepticus: Secondary | ICD-10-CM | POA: Diagnosis present

## 2020-11-18 DIAGNOSIS — J90 Pleural effusion, not elsewhere classified: Secondary | ICD-10-CM | POA: Diagnosis not present

## 2020-11-18 DIAGNOSIS — A419 Sepsis, unspecified organism: Secondary | ICD-10-CM | POA: Diagnosis not present

## 2020-11-18 DIAGNOSIS — R569 Unspecified convulsions: Secondary | ICD-10-CM | POA: Diagnosis not present

## 2020-11-18 DIAGNOSIS — J690 Pneumonitis due to inhalation of food and vomit: Secondary | ICD-10-CM | POA: Diagnosis present

## 2020-11-18 DIAGNOSIS — I639 Cerebral infarction, unspecified: Secondary | ICD-10-CM

## 2020-11-18 DIAGNOSIS — M4802 Spinal stenosis, cervical region: Secondary | ICD-10-CM | POA: Diagnosis not present

## 2020-11-18 DIAGNOSIS — G9340 Encephalopathy, unspecified: Secondary | ICD-10-CM

## 2020-11-18 DIAGNOSIS — G0490 Encephalitis and encephalomyelitis, unspecified: Secondary | ICD-10-CM | POA: Diagnosis not present

## 2020-11-18 DIAGNOSIS — I248 Other forms of acute ischemic heart disease: Secondary | ICD-10-CM | POA: Diagnosis not present

## 2020-11-18 DIAGNOSIS — Z87891 Personal history of nicotine dependence: Secondary | ICD-10-CM

## 2020-11-18 DIAGNOSIS — G9389 Other specified disorders of brain: Secondary | ICD-10-CM | POA: Diagnosis not present

## 2020-11-18 DIAGNOSIS — R571 Hypovolemic shock: Secondary | ICD-10-CM | POA: Diagnosis not present

## 2020-11-18 DIAGNOSIS — J9811 Atelectasis: Secondary | ICD-10-CM | POA: Diagnosis not present

## 2020-11-18 DIAGNOSIS — G928 Other toxic encephalopathy: Secondary | ICD-10-CM | POA: Diagnosis present

## 2020-11-18 DIAGNOSIS — R0902 Hypoxemia: Secondary | ICD-10-CM | POA: Diagnosis not present

## 2020-11-18 DIAGNOSIS — Z515 Encounter for palliative care: Secondary | ICD-10-CM | POA: Diagnosis not present

## 2020-11-18 DIAGNOSIS — S199XXA Unspecified injury of neck, initial encounter: Secondary | ICD-10-CM | POA: Diagnosis not present

## 2020-11-18 DIAGNOSIS — Z978 Presence of other specified devices: Secondary | ICD-10-CM

## 2020-11-18 DIAGNOSIS — Z4659 Encounter for fitting and adjustment of other gastrointestinal appliance and device: Secondary | ICD-10-CM

## 2020-11-18 DIAGNOSIS — I6503 Occlusion and stenosis of bilateral vertebral arteries: Secondary | ICD-10-CM | POA: Diagnosis not present

## 2020-11-18 DIAGNOSIS — J9690 Respiratory failure, unspecified, unspecified whether with hypoxia or hypercapnia: Secondary | ICD-10-CM | POA: Diagnosis not present

## 2020-11-18 DIAGNOSIS — R579 Shock, unspecified: Secondary | ICD-10-CM | POA: Diagnosis not present

## 2020-11-18 DIAGNOSIS — Z7984 Long term (current) use of oral hypoglycemic drugs: Secondary | ICD-10-CM

## 2020-11-18 DIAGNOSIS — L8990 Pressure ulcer of unspecified site, unspecified stage: Secondary | ICD-10-CM | POA: Insufficient documentation

## 2020-11-18 DIAGNOSIS — Z79899 Other long term (current) drug therapy: Secondary | ICD-10-CM

## 2020-11-18 DIAGNOSIS — E870 Hyperosmolality and hypernatremia: Secondary | ICD-10-CM | POA: Diagnosis not present

## 2020-11-18 DIAGNOSIS — G9341 Metabolic encephalopathy: Secondary | ICD-10-CM | POA: Diagnosis not present

## 2020-11-18 DIAGNOSIS — Z66 Do not resuscitate: Secondary | ICD-10-CM | POA: Diagnosis not present

## 2020-11-18 DIAGNOSIS — R0600 Dyspnea, unspecified: Secondary | ICD-10-CM | POA: Diagnosis not present

## 2020-11-18 DIAGNOSIS — G40919 Epilepsy, unspecified, intractable, without status epilepticus: Secondary | ICD-10-CM

## 2020-11-18 DIAGNOSIS — G039 Meningitis, unspecified: Secondary | ICD-10-CM | POA: Diagnosis not present

## 2020-11-18 DIAGNOSIS — R680 Hypothermia, not associated with low environmental temperature: Secondary | ICD-10-CM | POA: Diagnosis present

## 2020-11-18 DIAGNOSIS — Z20822 Contact with and (suspected) exposure to covid-19: Secondary | ICD-10-CM | POA: Diagnosis not present

## 2020-11-18 DIAGNOSIS — J9600 Acute respiratory failure, unspecified whether with hypoxia or hypercapnia: Secondary | ICD-10-CM

## 2020-11-18 DIAGNOSIS — I700 Atherosclerosis of aorta: Secondary | ICD-10-CM | POA: Diagnosis not present

## 2020-11-18 DIAGNOSIS — K219 Gastro-esophageal reflux disease without esophagitis: Secondary | ICD-10-CM | POA: Diagnosis present

## 2020-11-18 DIAGNOSIS — M47816 Spondylosis without myelopathy or radiculopathy, lumbar region: Secondary | ICD-10-CM | POA: Diagnosis not present

## 2020-11-18 DIAGNOSIS — E781 Pure hyperglyceridemia: Secondary | ICD-10-CM | POA: Diagnosis present

## 2020-11-18 DIAGNOSIS — Z743 Need for continuous supervision: Secondary | ICD-10-CM | POA: Diagnosis not present

## 2020-11-18 DIAGNOSIS — D509 Iron deficiency anemia, unspecified: Secondary | ICD-10-CM | POA: Diagnosis not present

## 2020-11-18 DIAGNOSIS — I6389 Other cerebral infarction: Secondary | ICD-10-CM | POA: Diagnosis not present

## 2020-11-18 DIAGNOSIS — I219 Acute myocardial infarction, unspecified: Secondary | ICD-10-CM | POA: Diagnosis not present

## 2020-11-18 DIAGNOSIS — R6521 Severe sepsis with septic shock: Secondary | ICD-10-CM

## 2020-11-18 DIAGNOSIS — Z801 Family history of malignant neoplasm of trachea, bronchus and lung: Secondary | ICD-10-CM

## 2020-11-18 DIAGNOSIS — Z8249 Family history of ischemic heart disease and other diseases of the circulatory system: Secondary | ICD-10-CM

## 2020-11-18 DIAGNOSIS — J45909 Unspecified asthma, uncomplicated: Secondary | ICD-10-CM | POA: Diagnosis present

## 2020-11-18 DIAGNOSIS — J9601 Acute respiratory failure with hypoxia: Secondary | ICD-10-CM | POA: Diagnosis present

## 2020-11-18 DIAGNOSIS — R4182 Altered mental status, unspecified: Secondary | ICD-10-CM | POA: Diagnosis not present

## 2020-11-18 DIAGNOSIS — I63233 Cerebral infarction due to unspecified occlusion or stenosis of bilateral carotid arteries: Secondary | ICD-10-CM | POA: Diagnosis not present

## 2020-11-18 DIAGNOSIS — I110 Hypertensive heart disease with heart failure: Secondary | ICD-10-CM | POA: Diagnosis not present

## 2020-11-18 DIAGNOSIS — G40911 Epilepsy, unspecified, intractable, with status epilepticus: Principal | ICD-10-CM | POA: Diagnosis present

## 2020-11-18 DIAGNOSIS — M4312 Spondylolisthesis, cervical region: Secondary | ICD-10-CM | POA: Diagnosis not present

## 2020-11-18 DIAGNOSIS — Z7189 Other specified counseling: Secondary | ICD-10-CM | POA: Diagnosis not present

## 2020-11-18 DIAGNOSIS — N179 Acute kidney failure, unspecified: Secondary | ICD-10-CM | POA: Diagnosis not present

## 2020-11-18 DIAGNOSIS — I5032 Chronic diastolic (congestive) heart failure: Secondary | ICD-10-CM | POA: Diagnosis not present

## 2020-11-18 DIAGNOSIS — I469 Cardiac arrest, cause unspecified: Secondary | ICD-10-CM | POA: Diagnosis not present

## 2020-11-18 DIAGNOSIS — Z885 Allergy status to narcotic agent status: Secondary | ICD-10-CM

## 2020-11-18 DIAGNOSIS — M6282 Rhabdomyolysis: Secondary | ICD-10-CM | POA: Diagnosis not present

## 2020-11-18 DIAGNOSIS — Z8673 Personal history of transient ischemic attack (TIA), and cerebral infarction without residual deficits: Secondary | ICD-10-CM

## 2020-11-18 DIAGNOSIS — R000 Tachycardia, unspecified: Secondary | ICD-10-CM | POA: Diagnosis not present

## 2020-11-18 DIAGNOSIS — I462 Cardiac arrest due to underlying cardiac condition: Secondary | ICD-10-CM | POA: Diagnosis not present

## 2020-11-18 DIAGNOSIS — Z880 Allergy status to penicillin: Secondary | ICD-10-CM

## 2020-11-18 DIAGNOSIS — Z800 Family history of malignant neoplasm of digestive organs: Secondary | ICD-10-CM

## 2020-11-18 DIAGNOSIS — I499 Cardiac arrhythmia, unspecified: Secondary | ICD-10-CM | POA: Diagnosis not present

## 2020-11-18 DIAGNOSIS — Z4682 Encounter for fitting and adjustment of non-vascular catheter: Secondary | ICD-10-CM | POA: Diagnosis not present

## 2020-11-18 DIAGNOSIS — E872 Acidosis: Secondary | ICD-10-CM | POA: Diagnosis not present

## 2020-11-18 DIAGNOSIS — Q283 Other malformations of cerebral vessels: Secondary | ICD-10-CM | POA: Diagnosis not present

## 2020-11-18 DIAGNOSIS — I672 Cerebral atherosclerosis: Secondary | ICD-10-CM | POA: Diagnosis not present

## 2020-11-18 DIAGNOSIS — C801 Malignant (primary) neoplasm, unspecified: Secondary | ICD-10-CM | POA: Diagnosis not present

## 2020-11-18 DIAGNOSIS — R6889 Other general symptoms and signs: Secondary | ICD-10-CM | POA: Diagnosis not present

## 2020-11-18 DIAGNOSIS — Z8042 Family history of malignant neoplasm of prostate: Secondary | ICD-10-CM

## 2020-11-18 DIAGNOSIS — M47812 Spondylosis without myelopathy or radiculopathy, cervical region: Secondary | ICD-10-CM | POA: Diagnosis not present

## 2020-11-18 DIAGNOSIS — E785 Hyperlipidemia, unspecified: Secondary | ICD-10-CM | POA: Diagnosis present

## 2020-11-18 LAB — I-STAT ARTERIAL BLOOD GAS, ED
Acid-base deficit: 1 mmol/L (ref 0.0–2.0)
Bicarbonate: 24.5 mmol/L (ref 20.0–28.0)
Calcium, Ion: 1.18 mmol/L (ref 1.15–1.40)
HCT: 36 % (ref 36.0–46.0)
Hemoglobin: 12.2 g/dL (ref 12.0–15.0)
O2 Saturation: 100 %
Patient temperature: 97.4
Potassium: 2.6 mmol/L — CL (ref 3.5–5.1)
Sodium: 144 mmol/L (ref 135–145)
TCO2: 26 mmol/L (ref 22–32)
pCO2 arterial: 43.8 mmHg (ref 32.0–48.0)
pH, Arterial: 7.354 (ref 7.350–7.450)
pO2, Arterial: 374 mmHg — ABNORMAL HIGH (ref 83.0–108.0)

## 2020-11-18 LAB — CSF CELL COUNT WITH DIFFERENTIAL
Eosinophils, CSF: 0 % (ref 0–1)
Lymphs, CSF: 0 % — ABNORMAL LOW (ref 40–80)
Monocyte-Macrophage-Spinal Fluid: 9 % — ABNORMAL LOW (ref 15–45)
RBC Count, CSF: 7 /mm3 — ABNORMAL HIGH
Segmented Neutrophils-CSF: 91 % — ABNORMAL HIGH (ref 0–6)
Tube #: 3
WBC, CSF: 206 /mm3 (ref 0–5)

## 2020-11-18 LAB — URINALYSIS, COMPLETE (UACMP) WITH MICROSCOPIC
Bilirubin Urine: NEGATIVE
Glucose, UA: NEGATIVE mg/dL
Ketones, ur: 5 mg/dL — AB
Leukocytes,Ua: NEGATIVE
Nitrite: NEGATIVE
Protein, ur: 100 mg/dL — AB
Specific Gravity, Urine: 1.016 (ref 1.005–1.030)
pH: 5 (ref 5.0–8.0)

## 2020-11-18 LAB — HEMOGLOBIN A1C
Hgb A1c MFr Bld: 7.1 % — ABNORMAL HIGH (ref 4.8–5.6)
Mean Plasma Glucose: 157.07 mg/dL

## 2020-11-18 LAB — CBC WITH DIFFERENTIAL/PLATELET
Abs Immature Granulocytes: 0.09 10*3/uL — ABNORMAL HIGH (ref 0.00–0.07)
Basophils Absolute: 0 10*3/uL (ref 0.0–0.1)
Basophils Relative: 0 %
Eosinophils Absolute: 0 10*3/uL (ref 0.0–0.5)
Eosinophils Relative: 0 %
HCT: 40.3 % (ref 36.0–46.0)
Hemoglobin: 12.3 g/dL (ref 12.0–15.0)
Immature Granulocytes: 1 %
Lymphocytes Relative: 11 %
Lymphs Abs: 2 10*3/uL (ref 0.7–4.0)
MCH: 24.9 pg — ABNORMAL LOW (ref 26.0–34.0)
MCHC: 30.5 g/dL (ref 30.0–36.0)
MCV: 81.7 fL (ref 80.0–100.0)
Monocytes Absolute: 1.7 10*3/uL — ABNORMAL HIGH (ref 0.1–1.0)
Monocytes Relative: 10 %
Neutro Abs: 13.6 10*3/uL — ABNORMAL HIGH (ref 1.7–7.7)
Neutrophils Relative %: 78 %
Platelets: 340 10*3/uL (ref 150–400)
RBC: 4.93 MIL/uL (ref 3.87–5.11)
RDW: 16.5 % — ABNORMAL HIGH (ref 11.5–15.5)
WBC: 17.4 10*3/uL — ABNORMAL HIGH (ref 4.0–10.5)
nRBC: 0 % (ref 0.0–0.2)

## 2020-11-18 LAB — COMPREHENSIVE METABOLIC PANEL
ALT: 31 U/L (ref 0–44)
AST: 97 U/L — ABNORMAL HIGH (ref 15–41)
Albumin: 3.9 g/dL (ref 3.5–5.0)
Alkaline Phosphatase: 45 U/L (ref 38–126)
Anion gap: 18 — ABNORMAL HIGH (ref 5–15)
BUN: 24 mg/dL — ABNORMAL HIGH (ref 8–23)
CO2: 19 mmol/L — ABNORMAL LOW (ref 22–32)
Calcium: 9.3 mg/dL (ref 8.9–10.3)
Chloride: 105 mmol/L (ref 98–111)
Creatinine, Ser: 1.53 mg/dL — ABNORMAL HIGH (ref 0.44–1.00)
GFR, Estimated: 35 mL/min — ABNORMAL LOW (ref >60)
Glucose, Bld: 105 mg/dL — ABNORMAL HIGH (ref 70–99)
Potassium: 3.2 mmol/L — ABNORMAL LOW (ref 3.5–5.1)
Sodium: 142 mmol/L (ref 135–145)
Total Bilirubin: 1.1 mg/dL (ref 0.3–1.2)
Total Protein: 7.1 g/dL (ref 6.5–8.1)

## 2020-11-18 LAB — I-STAT CHEM 8, ED
BUN: 24 mg/dL — ABNORMAL HIGH (ref 8–23)
Calcium, Ion: 1.06 mmol/L — ABNORMAL LOW (ref 1.15–1.40)
Chloride: 109 mmol/L (ref 98–111)
Creatinine, Ser: 1.3 mg/dL — ABNORMAL HIGH (ref 0.44–1.00)
Glucose, Bld: 109 mg/dL — ABNORMAL HIGH (ref 70–99)
HCT: 39 % (ref 36.0–46.0)
Hemoglobin: 13.3 g/dL (ref 12.0–15.0)
Potassium: 3 mmol/L — ABNORMAL LOW (ref 3.5–5.1)
Sodium: 143 mmol/L (ref 135–145)
TCO2: 22 mmol/L (ref 22–32)

## 2020-11-18 LAB — I-STAT VENOUS BLOOD GAS, ED
Acid-base deficit: 4 mmol/L — ABNORMAL HIGH (ref 0.0–2.0)
Bicarbonate: 20.6 mmol/L (ref 20.0–28.0)
Calcium, Ion: 1.05 mmol/L — ABNORMAL LOW (ref 1.15–1.40)
HCT: 40 % (ref 36.0–46.0)
Hemoglobin: 13.6 g/dL (ref 12.0–15.0)
O2 Saturation: 87 %
Potassium: 3 mmol/L — ABNORMAL LOW (ref 3.5–5.1)
Sodium: 143 mmol/L (ref 135–145)
TCO2: 22 mmol/L (ref 22–32)
pCO2, Ven: 35 mmHg — ABNORMAL LOW (ref 44.0–60.0)
pH, Ven: 7.378 (ref 7.250–7.430)
pO2, Ven: 53 mmHg — ABNORMAL HIGH (ref 32.0–45.0)

## 2020-11-18 LAB — PROTEIN AND GLUCOSE, CSF
Glucose, CSF: 75 mg/dL — ABNORMAL HIGH (ref 40–70)
Total  Protein, CSF: 94 mg/dL — ABNORMAL HIGH (ref 15–45)

## 2020-11-18 LAB — BASIC METABOLIC PANEL
Anion gap: 15 (ref 5–15)
BUN: 25 mg/dL — ABNORMAL HIGH (ref 8–23)
CO2: 22 mmol/L (ref 22–32)
Calcium: 8.6 mg/dL — ABNORMAL LOW (ref 8.9–10.3)
Chloride: 107 mmol/L (ref 98–111)
Creatinine, Ser: 1.33 mg/dL — ABNORMAL HIGH (ref 0.44–1.00)
GFR, Estimated: 41 mL/min — ABNORMAL LOW (ref >60)
Glucose, Bld: 97 mg/dL (ref 70–99)
Potassium: 3.2 mmol/L — ABNORMAL LOW (ref 3.5–5.1)
Sodium: 144 mmol/L (ref 135–145)

## 2020-11-18 LAB — GLUCOSE, CAPILLARY
Glucose-Capillary: 137 mg/dL — ABNORMAL HIGH (ref 70–99)
Glucose-Capillary: 113 mg/dL — ABNORMAL HIGH (ref 70–99)

## 2020-11-18 LAB — TROPONIN I (HIGH SENSITIVITY)
Troponin I (High Sensitivity): 2524 ng/L (ref <18)
Troponin I (High Sensitivity): 1761 ng/L (ref <18)

## 2020-11-18 LAB — CBG MONITORING, ED: Glucose-Capillary: 114 mg/dL — ABNORMAL HIGH (ref 70–99)

## 2020-11-18 LAB — RESP PANEL BY RT-PCR (FLU A&B, COVID) ARPGX2
Influenza A by PCR: NEGATIVE
Influenza B by PCR: NEGATIVE
SARS Coronavirus 2 by RT PCR: NEGATIVE

## 2020-11-18 LAB — LACTIC ACID, PLASMA
Lactic Acid, Venous: 3.9 mmol/L (ref 0.5–1.9)
Lactic Acid, Venous: 1.3 mmol/L (ref 0.5–1.9)

## 2020-11-18 LAB — CK: Total CK: 4572 U/L — ABNORMAL HIGH (ref 38–234)

## 2020-11-18 LAB — PROCALCITONIN: Procalcitonin: 3.04 ng/mL

## 2020-11-18 MED ORDER — VANCOMYCIN HCL 1250 MG/250ML IV SOLN
1250.0000 mg | Freq: Once | INTRAVENOUS | Status: AC
  Administered 2020-11-18: 1250 mg via INTRAVENOUS
  Filled 2020-11-18: qty 250

## 2020-11-18 MED ORDER — LEVETIRACETAM IN NACL 1000 MG/100ML IV SOLN
1000.0000 mg | INTRAVENOUS | Status: AC
  Administered 2020-11-18 (×2): 1000 mg via INTRAVENOUS

## 2020-11-18 MED ORDER — ASPIRIN 300 MG RE SUPP
300.0000 mg | Freq: Once | RECTAL | Status: AC
  Administered 2020-11-18: 300 mg via RECTAL
  Filled 2020-11-18: qty 1

## 2020-11-18 MED ORDER — LACTATED RINGERS IV SOLN: INTRAVENOUS | Status: DC

## 2020-11-18 MED ORDER — ACETAMINOPHEN 160 MG/5ML PO SOLN
650.0000 mg | Freq: Once | ORAL | Status: DC
  Filled 2020-11-18: qty 20.3

## 2020-11-18 MED ORDER — ROCURONIUM BROMIDE 50 MG/5ML IV SOLN
INTRAVENOUS | Status: AC | PRN
  Administered 2020-11-18: 71 mg via INTRAVENOUS

## 2020-11-18 MED ORDER — INSULIN ASPART 100 UNIT/ML IJ SOLN
0.0000 [IU] | INTRAMUSCULAR | Status: DC
  Administered 2020-11-18 – 2020-11-23 (×13): 1 [IU] via SUBCUTANEOUS
  Administered 2020-11-23: 2 [IU] via SUBCUTANEOUS
  Administered 2020-11-23 – 2020-11-24 (×3): 1 [IU] via SUBCUTANEOUS
  Administered 2020-11-25: 3 [IU] via SUBCUTANEOUS
  Administered 2020-11-25 (×2): 2 [IU] via SUBCUTANEOUS
  Administered 2020-11-25: 3 [IU] via SUBCUTANEOUS
  Administered 2020-11-26 (×4): 5 [IU] via SUBCUTANEOUS
  Administered 2020-11-26: 3 [IU] via SUBCUTANEOUS
  Administered 2020-11-26 – 2020-11-27 (×4): 5 [IU] via SUBCUTANEOUS

## 2020-11-18 MED ORDER — FENTANYL CITRATE (PF) 100 MCG/2ML IJ SOLN
50.0000 ug | INTRAMUSCULAR | Status: DC | PRN
  Filled 2020-11-18: qty 2

## 2020-11-18 MED ORDER — ORAL CARE MOUTH RINSE
15.0000 mL | OROMUCOSAL | Status: DC
  Administered 2020-11-18 – 2020-11-30 (×116): 15 mL via OROMUCOSAL

## 2020-11-18 MED ORDER — ACETAMINOPHEN 650 MG RE SUPP
650.0000 mg | Freq: Once | RECTAL | Status: AC
  Administered 2020-11-18: 650 mg via RECTAL
  Filled 2020-11-18: qty 1

## 2020-11-18 MED ORDER — PHENYLEPHRINE 40 MCG/ML (10ML) SYRINGE FOR IV PUSH (FOR BLOOD PRESSURE SUPPORT)
PREFILLED_SYRINGE | INTRAVENOUS | Status: AC
  Filled 2020-11-18: qty 10

## 2020-11-18 MED ORDER — DOCUSATE SODIUM 100 MG PO CAPS: 100.0000 mg | ORAL_CAPSULE | Freq: Two times a day (BID) | ORAL | Status: DC | PRN

## 2020-11-18 MED ORDER — VANCOMYCIN HCL 750 MG/150ML IV SOLN
750.0000 mg | INTRAVENOUS | Status: DC
  Administered 2020-11-19: 750 mg via INTRAVENOUS
  Filled 2020-11-18 (×3): qty 150

## 2020-11-18 MED ORDER — PIPERACILLIN-TAZOBACTAM 3.375 G IVPB: 3.3750 g | Freq: Once | INTRAVENOUS | Status: DC

## 2020-11-18 MED ORDER — POTASSIUM CHLORIDE 20 MEQ PO PACK
40.0000 meq | PACK | Freq: Three times a day (TID) | ORAL | Status: AC
  Administered 2020-11-18 – 2020-11-19 (×3): 40 meq
  Filled 2020-11-18 (×3): qty 2

## 2020-11-18 MED ORDER — SODIUM CHLORIDE 0.9 % IV SOLN
2.0000 g | Freq: Once | INTRAVENOUS | Status: AC
  Administered 2020-11-18: 2 g via INTRAVENOUS
  Filled 2020-11-18: qty 2

## 2020-11-18 MED ORDER — POLYETHYLENE GLYCOL 3350 17 G PO PACK: 17.0000 g | PACK | Freq: Every day | ORAL | Status: DC | PRN

## 2020-11-18 MED ORDER — PANTOPRAZOLE SODIUM 40 MG IV SOLR
40.0000 mg | Freq: Every day | INTRAVENOUS | Status: DC
  Administered 2020-11-18 – 2020-11-21 (×4): 40 mg via INTRAVENOUS
  Filled 2020-11-18 (×4): qty 40

## 2020-11-18 MED ORDER — POTASSIUM CHLORIDE 10 MEQ/100ML IV SOLN
10.0000 meq | INTRAVENOUS | Status: AC
  Administered 2020-11-18 (×3): 10 meq via INTRAVENOUS
  Filled 2020-11-18 (×3): qty 100

## 2020-11-18 MED ORDER — PHENYLEPHRINE 40 MCG/ML (10ML) SYRINGE FOR IV PUSH (FOR BLOOD PRESSURE SUPPORT)
PREFILLED_SYRINGE | INTRAVENOUS | Status: AC | PRN
Start: 2020-11-18 — End: 2020-11-18
  Administered 2020-11-18 (×2): 100 ug via INTRAVENOUS

## 2020-11-18 MED ORDER — SODIUM CHLORIDE 0.9 % IV BOLUS
1000.0000 mL | Freq: Once | INTRAVENOUS | Status: AC
  Administered 2020-11-18: 1000 mL via INTRAVENOUS

## 2020-11-18 MED ORDER — LORAZEPAM 2 MG/ML IJ SOLN
INTRAMUSCULAR | Status: AC
  Filled 2020-11-18: qty 1

## 2020-11-18 MED ORDER — SODIUM CHLORIDE 0.9 % IV SOLN: 1.0000 g | Freq: Four times a day (QID) | INTRAVENOUS | Status: DC

## 2020-11-18 MED ORDER — LEVETIRACETAM IN NACL 500 MG/100ML IV SOLN: 500.0000 mg | Freq: Two times a day (BID) | INTRAVENOUS | Status: DC

## 2020-11-18 MED ORDER — NOREPINEPHRINE 4 MG/250ML-% IV SOLN
0.0000 ug/min | INTRAVENOUS | Status: DC
  Administered 2020-11-18: 10 ug/min via INTRAVENOUS
  Administered 2020-11-19: 2 ug/min via INTRAVENOUS
  Administered 2020-11-20: 4 ug/min via INTRAVENOUS
  Administered 2020-11-21: 6 ug/min via INTRAVENOUS
  Administered 2020-11-23 (×2): 2 ug/min via INTRAVENOUS
  Administered 2020-11-24 – 2020-11-26 (×3): 3 ug/min via INTRAVENOUS
  Administered 2020-11-27: 5 ug/min via INTRAVENOUS
  Filled 2020-11-18 (×10): qty 250

## 2020-11-18 MED ORDER — SODIUM CHLORIDE 0.9 % IV SOLN
250.0000 mg | Freq: Once | INTRAVENOUS | Status: AC
  Administered 2020-11-18: 250 mg via INTRAVENOUS
  Filled 2020-11-18: qty 2.5

## 2020-11-18 MED ORDER — CHLORHEXIDINE GLUCONATE 0.12% ORAL RINSE (MEDLINE KIT)
15.0000 mL | Freq: Two times a day (BID) | OROMUCOSAL | Status: DC
  Administered 2020-11-18 – 2020-11-29 (×23): 15 mL via OROMUCOSAL

## 2020-11-18 MED ORDER — PROPOFOL 1000 MG/100ML IV EMUL
15.0000 ug/kg/min | INTRAVENOUS | Status: DC
  Administered 2020-11-19: 15 ug/kg/min via INTRAVENOUS
  Filled 2020-11-18: qty 100

## 2020-11-18 MED ORDER — FENTANYL CITRATE (PF) 100 MCG/2ML IJ SOLN
50.0000 ug | INTRAMUSCULAR | Status: DC | PRN
  Administered 2020-11-18: 50 ug via INTRAVENOUS

## 2020-11-18 MED ORDER — ETOMIDATE 2 MG/ML IV SOLN
INTRAVENOUS | Status: AC | PRN
  Administered 2020-11-18: 20 mg via INTRAVENOUS

## 2020-11-18 MED ORDER — SODIUM CHLORIDE 0.9 % IV SOLN
2.0000 g | Freq: Two times a day (BID) | INTRAVENOUS | Status: DC
  Administered 2020-11-18 – 2020-11-25 (×15): 2 g via INTRAVENOUS
  Filled 2020-11-18 (×2): qty 2
  Filled 2020-11-18: qty 20
  Filled 2020-11-18 (×5): qty 2
  Filled 2020-11-18: qty 20
  Filled 2020-11-18 (×3): qty 2
  Filled 2020-11-18: qty 20
  Filled 2020-11-18 (×2): qty 2
  Filled 2020-11-18: qty 20

## 2020-11-18 MED ORDER — PROPOFOL 1000 MG/100ML IV EMUL
0.0000 ug/kg/min | INTRAVENOUS | Status: DC
Start: 2020-11-18 — End: 2020-11-18
  Administered 2020-11-18: 5 ug/kg/min via INTRAVENOUS

## 2020-11-18 MED ORDER — DEXTROSE 5 % IV SOLN
10.0000 mg/kg | Freq: Two times a day (BID) | INTRAVENOUS | Status: DC
  Administered 2020-11-18 – 2020-11-22 (×8): 650 mg via INTRAVENOUS
  Filled 2020-11-18 (×9): qty 13

## 2020-11-18 MED ORDER — SODIUM CHLORIDE 0.9 % IV SOLN
750.0000 mg | Freq: Two times a day (BID) | INTRAVENOUS | Status: DC
  Administered 2020-11-19: 750 mg via INTRAVENOUS
  Filled 2020-11-18 (×2): qty 7.5

## 2020-11-18 MED ORDER — ACETAMINOPHEN 160 MG/5ML PO SOLN
650.0000 mg | Freq: Four times a day (QID) | ORAL | Status: DC | PRN
  Administered 2020-11-19 – 2020-11-28 (×5): 650 mg via ORAL
  Filled 2020-11-18 (×6): qty 20.3

## 2020-11-18 MED ORDER — SODIUM CHLORIDE 0.9 % IV SOLN
2.0000 g | Freq: Four times a day (QID) | INTRAVENOUS | Status: DC
  Administered 2020-11-18 – 2020-11-25 (×29): 2 g via INTRAVENOUS
  Filled 2020-11-18 (×2): qty 2
  Filled 2020-11-18: qty 2000
  Filled 2020-11-18 (×2): qty 2
  Filled 2020-11-18 (×3): qty 2000
  Filled 2020-11-18: qty 2
  Filled 2020-11-18 (×3): qty 2000
  Filled 2020-11-18: qty 2
  Filled 2020-11-18 (×2): qty 2000
  Filled 2020-11-18 (×6): qty 2
  Filled 2020-11-18: qty 2000
  Filled 2020-11-18 (×2): qty 2
  Filled 2020-11-18: qty 2000
  Filled 2020-11-18: qty 2
  Filled 2020-11-18 (×5): qty 2000

## 2020-11-18 NOTE — Progress Notes (Signed)
STAT LTM EEG hooked up and running - no initial skin breakdown - push button tested - neuro notified.

## 2020-11-18 NOTE — ED Notes (Signed)
O2 sensor is not picking up, attempting to find working cords

## 2020-11-18 NOTE — Progress Notes (Signed)
Dr Tacy Learn to perform Lumbar Puncture for CSF analysis. Time out done. Consent has been obtained on phone by Dr Tacy Learn and pt's son Legrand Como.

## 2020-11-18 NOTE — ED Notes (Signed)
Reported critical troponin of 2524 to Dr. Florene Glen.

## 2020-11-18 NOTE — Progress Notes (Signed)
Visited with Patient. Per Ed Physician  pt possibly had stroke and is now intubated.  Dr. Shared this info with family. Chaplain escorted  family escorted to bedside for visit. Provided support to staff and emotional and spiritual support to family.  Chaplain available as needed.  Jaclynn Major, [place], [facility], Pager [phone]

## 2020-11-18 NOTE — ED Notes (Signed)
Attempted report will try back in 5.

## 2020-11-18 NOTE — ED Triage Notes (Signed)
Pt BIBA from home;. Last seen normal 1230 yesterday. Pt was found unresponsive on the floor by daughter. Pt had 4 seizures with EMS, received 5 midazolam en route. 10-20 seconds each. Right sided gaze. Pt is on thinners.

## 2020-11-18 NOTE — ED Notes (Signed)
Multiple attempts made by myself and Emilee RN to obtain 2nd set of cultures. Unable to obtain at this time. Abx started

## 2020-11-18 NOTE — ED Notes (Addendum)
Error-see med charted

## 2020-11-18 NOTE — Progress Notes (Signed)
Pharmacy Antibiotic Note  Margaret Rangel is a 75 y.o. female admitted on 12/04/2020 with meningitis.  Pharmacy has been consulted for vancomycin, ceftriaxone, and acyclovir dosing. Patient was found unresponsive on the floor and had at least 5 seizures. WBC 17.4, Scr 1.3, LA 1.3, and temp 101.4.   Plan: Vancomycin 1250 mg IV x1 load Vancomycin 750 mg IV Q 24 hrs. Goal AUC 400-550. Expected AUC: 540 SCr used: 1.3 Ceftriaxone 2 g IV q12h Acyclovir 10 mg/kg (650 mg) IV q12h w/ LR 150 mL/hr Monitor renal function, clinical status, and abx plan. Get vanc levels as clinically indicated.    Height: '5\' 3"'$  (160 cm) Weight: 65 kg (143 lb 4.8 oz) (estimated based on previous weight) IBW/kg (Calculated) : 52.4  Temp (24hrs), Avg:100.7 F (38.2 C), Min:97.4 F (36.3 C), Max:101.4 F (38.6 C)  Recent Labs  Lab 12/12/2020 1034 11/27/2020 1039 11/27/2020 1148 11/24/2020 1234  WBC 17.4*  --   --   --   CREATININE 1.53* 1.30*  --   --   LATICACIDVEN  --   --  3.9* 1.3    Estimated Creatinine Clearance: 33.4 mL/min (A) (by C-G formula based on SCr of 1.3 mg/dL (H)).    Allergies  Allergen Reactions   Codeine Itching   Penicillins Itching    Has patient had a PCN reaction causing immediate rash, facial/tongue/throat swelling, SOB or lightheadedness with hypotension:No Has patient had a PCN reaction causing severe rash involving mucus membranes or skin necrosis:No Has patient had a PCN reaction that required hopititalization:No Has patient had a PCN reaction occurring within the last 10 years:No If all of the above answers are "NO", then may proceed with Cephalosporin use.     Antimicrobials this admission: Vancomycin 8/5 >>  Ceftriaxone 8/5 >>  Acyclovir 8/5 >>  Dose adjustments this admission: Monitor renal function for any dose adjustments.  Microbiology results: 8/5 BCx: in process   Thank you for allowing pharmacy to be a part of this patient's care.  Varney Daily,  PharmD PGY1 Pharmacy Resident  Please check AMION for all [facility] pharmacy phone numbers After 10:00 PM call main pharmacy [phone]

## 2020-11-18 NOTE — ED Notes (Signed)
CRITICAL VALUE STICKER  CRITICAL VALUE: Lactic 3.9  RECEIVER (on-site recipient of call): M Delvin Hedeen   DATE & TIME NOTIFIED: 11/29/2020 1247  MESSENGER (representative from lab):  MD NOTIFIED: Florene Glen  TIME OF NOTIFICATION: [id number]  RESPONSE: No new orders, expected results

## 2020-11-18 NOTE — Sedation Documentation (Signed)
Tube placement verified via xr

## 2020-11-18 NOTE — Progress Notes (Signed)
CSF WBC 206. MD Chand notified.

## 2020-11-18 NOTE — H&P (Addendum)
NAME:  Margaret Rangel, MRN:  [id number], DOB:  September 25, 1945, LOS: 0 ADMISSION DATE:  11/19/2020, CONSULTATION DATE:  8/5 REFERRING MD:  picekring, CHIEF COMPLAINT:  seizure    History of Present Illness:  75 year old female with history as per below.  Per family was in usual state of health until 8/4 when her daughter noted she reported more fatigue than usual.  Otherwise no pertinent history observed.Found by her daughter this morning on 8/5 lying on the floor unresponsive.  She was last seen normal the night before.  When her daughter found her, she was nonverbal, unresponsive, had eye deviation upward.  She initiated CPR at the instructions of 911, and called EMS.  On EMS arrival they witnessed at least 5 generalized seizures.  These lasted 10 to 20 seconds apiece with forced gaze deviation to the right and twitching right upper extremity.  She was treated by EMS with 5 mg of midazolam in route, she was given another 2 mg in the emergency room for ongoing seizure activity, loaded with IV Keppra, intubated for airway protection, and neurology was consulted.  Continuous EEG was initiated, additional pertinent findings within the emergency room: New leukocytosis, low-grade fever, some postintubation hypotension which responded to volume resuscitation.  Critical care asked to admit.  Pertinent  Medical History  Hypertension, diabetes, prior CVAs.  Chronic lumbar pain.  Upper lipidemia.  Systolic heart murmur.  Osteoporosis prior esophageal stricture.  Esophageal reflux.  Peripheral vascular disease Significant Hospital Events: Including procedures, antibiotic start and stop dates in addition to other pertinent events   8/5 intubated for airway protection after being given several doses of benzodiazapines and loaded w/ keppra. IV vanc,ceftriaxone and acyclovir started. LP planned. LTM initiated. CT head w/ encephalomalacia and multiple prior infacts.   Interim History / Subjective:  Sedated   Objective    Blood pressure (Abnormal) 141/52, pulse 96, temperature (Abnormal) 101.2 F (38.4 C), resp. rate 18, height '5\' 3"'  (1.6 m), weight 65 kg, SpO2 100 %.    Vent Mode: PRVC FiO2 (%):  [50 %-100 %] 50 % Set Rate:  [18 bmp] 18 bmp Vt Set:  [420 mL] 420 mL PEEP:  [5 cmH20] 5 cmH20 Plateau Pressure:  [16 cmH20-22 cmH20] 22 cmH20   Intake/Output Summary (Last 24 hours) at 11/24/2020 1309 Last data filed at 11/21/2020 1159 Gross per 24 hour  Intake 12.4 ml  Output no documentation  Net 12.4 ml   Filed Weights   12/14/2020 1000  Weight: 65 kg    Examination: General: Sedated on propofol drip.  Currently with LTM monitor in place HENT: Pupils equal reactive sclera nonicteric orally intubated Lungs: Coarse scattered rhonchi throughout currently on full ventilator support Cardiovascular: Regular rate and rhythm systolic murmur audible Abdomen: Soft not tender Extremities: Warm dry Neuro: Sedated on propofol GU: Due to void  Resolved Hospital Problem list     Assessment & Plan:  Status epilepticus superimposed on history of prior stroke. Now status post IV benzodiazepines, and IV Keppra.  Current EEG without seizure but demonstrating increased activity on the left hemisphere.  Not sure if active infection decrease seizure threshold, or also consider primary CNS infection as etiology Plan Continue propofol infusion, neurology has increased dosing AEDs per neurology, already loaded with Keppra, dosing will be increased to 750 mg twice daily Continuing LTM, with plan to admit to neurointensive care Seizure precautions MRI brain   Acute toxic/metabolic encephalopathy.  Likely multifactorial, partially status, now postictal, sedating medications, also question about possible  sepsis/infection Plan Treating seizures as above Panculture, blood and urine.  I worry about possible aspiration so we will send sputum Most importantly will attempt lumbar puncture to rule out CNS  infection Broad-spectrum antibiotics with CNS coverage to include vancomycin, ceftriaxone, and acyclovir.  Acute hypoxic respiratory failure with ineffective airway protection.  Initial chest x-ray clear but very concerned about possible aspiration event especially in light of history of esophageal strictures Plan Full ventilator support PAD protocol for -3 RASS goal VAP bundle Respiratory culture A.m. chest x-ray  SIRS/sepsis.  Differential diagnosis includes urinary tract infection, CNS infection.  Did have lactic acidosis, but more inclined to think this was from seizure, lactate cleared. Plan Broad-spectrum antibiotics as mentioned above IV hydration  Mild troponin elevation Plan Twelve-lead Monitor telemetry Repeat troponins Consider echo  AKI, likely exacerbated by post intubation hypotension.  She is at risk for rhabdo given elevated CKs and prolonged seizure Plan IV hydration Serial total CK's Strict intake output  Fluid electrolyte imbalance: Hypokalemia, anion gap metabolic acidosis from clearing lactic acidosis Plan Replace potassium Serial chemistries  Diabetes Plan Sliding scale insulin Best Practice (right click and "Reselect all SmartList Selections" daily)   Diet/type: NPO w/ oral meds DVT prophylaxis: SCD GI prophylaxis: PPI Lines: N/A Foley:  Yes, and it is still needed Code Status:  full code Last date of multidisciplinary goals of care discussion [to ICU ]  Labs   CBC: Recent Labs  Lab 11/17/2020 1034 12/11/2020 1039 11/17/2020 1132  WBC 17.4*  --   --   NEUTROABS 13.6*  --   --   HGB 12.3 13.3  13.6 12.2  HCT 40.3 39.0  40.0 36.0  MCV 81.7  --   --   PLT 340  --   --     Basic Metabolic Panel: Recent Labs  Lab 12/07/2020 1034 12/06/2020 1039 11/19/2020 1132  NA 142 143  143 144  K 3.2* 3.0*  3.0* 2.6*  CL 105 109  --   CO2 19*  --   --   GLUCOSE 105* 109*  --   BUN 24* 24*  --   CREATININE 1.53* 1.30*  --   CALCIUM 9.3  --   --     GFR: Estimated Creatinine Clearance: 33.4 mL/min (A) (by C-G formula based on SCr of 1.3 mg/dL (H)). Recent Labs  Lab 12/11/2020 1034 11/21/2020 1148  WBC 17.4*  --   LATICACIDVEN  --  3.9*    Liver Function Tests: Recent Labs  Lab 11/25/2020 1034  AST 97*  ALT 31  ALKPHOS 45  BILITOT 1.1  PROT 7.1  ALBUMIN 3.9   No results for input(s): LIPASE, AMYLASE in the last 168 hours. No results for input(s): AMMONIA in the last 168 hours.  ABG    Component Value Date/Time   PHART 7.354 12/01/2020 1132   PCO2ART 43.8 11/22/2020 1132   PO2ART 374 (H) 11/14/2020 1132   HCO3 24.5 11/25/2020 1132   TCO2 26 12/12/2020 1132   ACIDBASEDEF 1.0 11/19/2020 1132   O2SAT 100.0 12/03/2020 1132     Coagulation Profile: No results for input(s): INR, PROTIME in the last 168 hours.  Cardiac Enzymes: Recent Labs  Lab 11/22/2020 1034  CKTOTAL 4,572*    HbA1C: HbA1c, POC (controlled diabetic range)  Date/Time Value Ref Range Status  08/17/2020 02:25 PM 6.4 0.0 - 7.0 % Final  12/30/2019 02:18 PM 6.0 0.0 - 7.0 % Final    CBG: Recent Labs  Lab 12/07/2020 1044  GLUCAP 114*    Review of Systems:   Not able  Past Medical History:  She,  has a past medical history of Abdominal pain, Anal fissure, Arthritis, Asthma, Carotid artery occlusion, Chronic back pain, Colon adenoma (2009, 2012), Diverticulosis of colon (without mention of hemorrhage) (2009), DM (diabetes mellitus) ([place]), GERD (gastroesophageal reflux disease), Hemorrhoids, Hernia, HTN (hypertension), Hyperlipidemia, Irritable bowel syndrome (IBS), Low blood potassium, Obese, Stroke ([place]), and Ulcer.   Surgical History:   Past Surgical History:  Procedure Laterality Date   BUBBLE STUDY  03/03/2019   Procedure: BUBBLE STUDY;  Surgeon: Pixie Casino, MD;  Location: [facility];  Service: Cardiovascular;;   COLONOSCOPY     FLEXIBLE SIGMOIDOSCOPY N/A 07/14/2012   Procedure: FLEXIBLE SIGMOIDOSCOPY;  Surgeon: Inda Castle, MD;   Location: [facility] ENDOSCOPY;  Service: Endoscopy;  Laterality: N/A;   HEMORRHOID BANDING N/A 07/14/2012   Procedure: HEMORRHOID BANDING;  Surgeon: Inda Castle, MD;  Location: [facility] ENDOSCOPY;  Service: Endoscopy;  Laterality: N/A;   PARTIAL HYSTERECTOMY  1971   POLYPECTOMY     TEE WITHOUT CARDIOVERSION N/A 03/03/2019   Procedure: TRANSESOPHAGEAL ECHOCARDIOGRAM (TEE);  Surgeon: Pixie Casino, MD;  Location[facility] ENDOSCOPY;  Service: Cardiovascular;  Laterality: N/A;   TONSILLECTOMY       Social History:   reports that she quit smoking about 4 years ago. Her smoking use included cigarettes. She started smoking about 44 years ago. She has a 9.75 pack-year smoking history. She has never used smokeless tobacco. She reports previous alcohol use of about 21.0 standard drinks of alcohol per week. She reports current drug use. Drug: Marijuana.   Family History:  Her family history includes Anuerysm in her father; Colon cancer in her brother; Healthy in her child; Heart disease in her mother; Lung cancer in her brother; Prostate cancer in her brother. There is no history of Rectal cancer, Stomach cancer, or Esophageal cancer.   Allergies Allergies  Allergen Reactions   Codeine Itching   Penicillins Itching    Has patient had a PCN reaction causing immediate rash, facial/tongue/throat swelling, SOB or lightheadedness with hypotension:No Has patient had a PCN reaction causing severe rash involving mucus membranes or skin necrosis:No Has patient had a PCN reaction that required hopititalization:No Has patient had a PCN reaction occurring within the last 10 years:No If all of the above answers are "NO", then may proceed with Cephalosporin use.      Home Medications  Prior to Admission medications   Medication Sig Start Date End Date Taking? Authorizing Provider  losartan (COZAAR) 25 MG tablet TAKE 1 TABLET BY MOUTH AT BEDTIME Patient taking differently: Take 25 mg by mouth at bedtime. 09/07/20  Yes  Daisy Floro, DO  meloxicam (MOBIC) 7.5 MG tablet Take 7.5 mg by mouth daily. 11/08/20  Yes [provider]  metFORMIN (GLUCOPHAGE-XR) 500 MG 24 hr tablet Take 2 tablets (1,000 mg total) by mouth daily with breakfast. 06/09/20  Yes Milus Banister C, DO  pantoprazole (PROTONIX) 40 MG tablet Take 1 tablet by mouth once daily Patient taking differently: Take 40 mg by mouth daily. 09/08/20  Yes Milus Banister C, DO  rosuvastatin (CRESTOR) 40 MG tablet Take 1 tablet (40 mg total) by mouth daily. 09/05/20  Yes Daisy Floro, DO  traMADol (ULTRAM) 50 MG tablet Take 1 tablet (50 mg total) by mouth every 12 (twelve) hours as needed. Patient taking differently: Take 50 mg by mouth every 8 (eight) hours as needed for moderate pain. 10/19/20  Yes Brimage, Vondra, DO  albuterol (PROVENTIL HFA;VENTOLIN HFA) 108 (90 Base) MCG/ACT inhaler Inhale 2 puffs into the lungs every 6 (six) hours as needed for wheezing or shortness of breath. 06/16/18   Everrett Coombe, MD  Blood Glucose Calibration (ONETOUCH VERIO) High SOLN 1 each by In Vitro route as directed. 12/26/16   Dickie La, MD  Blood Glucose Calibration (ONETOUCH VERIO) SOLN 1 Bottle by In Vitro route as directed. 12/26/16   Dickie La, MD  Blood Glucose Monitoring Suppl (ONETOUCH VERIO) w/Device KIT 1 kit by Does not apply route 3 (three) times daily. ICD-10 code: E11.9 12/26/16   Dickie La, MD  carvedilol (COREG) 3.125 MG tablet Take 1 tablet by mouth twice daily 05/09/19   Daisy Floro, DO  clopidogrel (PLAVIX) 75 MG tablet Take 1 tablet (75 mg total) by mouth daily. Please keep upcoming appt in April with Dr. Curt Bears before anymore refills. Thank you Patient not taking: No sig reported 06/01/19   Camnitz, Ocie Doyne, MD  diclofenac Sodium (VOLTAREN) 1 % GEL Apply 1 application topically 3 (three) times daily as needed (back pain).    [provider]  docusate sodium (COLACE) 100 MG capsule Take 1 capsule (100 mg total) by mouth  every 12 (twelve) hours. Patient taking differently: Take 100 mg by mouth 2 (two) times daily as needed (constipation). 11/04/18   Daisy Floro, DO  empagliflozin (JARDIANCE) 10 MG TABS tablet Take 1 tablet (10 mg total) by mouth daily. 06/01/20   Daisy Floro, DO  ezetimibe (ZETIA) 10 MG tablet Take 1 tablet (10 mg total) by mouth daily. 12/22/19   Daisy Floro, DO  Ferrous Sulfate (IRON) 325 (65 Fe) MG TABS Take 1 tablet (325 mg total) by mouth 2 (two) times daily with a meal. Patient not taking: Reported on 03/24/2020 01/26/19   Patriciaann Clan, DO  glucose blood (ONETOUCH VERIO) test strip Use as instructed to test glucose level three times daily. ICD-10 code: E11.9 06/16/18   Everrett Coombe, MD  Lancet Devices (ONE TOUCH DELICA LANCING DEV) MISC 1 Device by Does not apply route as directed. ICD-10 code: E11.9 12/26/16   Dickie La, MD  polyethylene glycol (MIRALAX / GLYCOLAX) 17 g packet Take 17 g by mouth daily. 11/04/18   Daisy Floro, DO     Critical care time: 34 min      Erick Colace ACNP-BC [place] Pager # [phone] OR # [phone] if no answer

## 2020-11-18 NOTE — ED Notes (Signed)
Pt transported to CT ?

## 2020-11-18 NOTE — Progress Notes (Signed)
[facility] Progress Note Patient Name: Margaret Rangel DOB: October 04, 1945 MRN: [id number]   Date of Service  12/11/2020  HPI/Events of Note  Notified of fever Tmax 101.48.  Pt is on empiric broad spectrum antibiotics and had LP done earlier.  eICU Interventions  Tylenol ordered prn. CSF culturs and blood culture pending.         Elsie Lincoln 11/14/2020, 9:05 PM

## 2020-11-18 NOTE — Progress Notes (Signed)
PHARMACY NOTE:  ANTIMICROBIAL RENAL DOSAGE ADJUSTMENT  Current antimicrobial regimen includes a mismatch between antimicrobial dosage and estimated renal function.  As per policy approved by the Pharmacy & Therapeutics and Medical Executive Committees, the antimicrobial dosage will be adjusted accordingly.  Current antimicrobial dosage:  Ampicillin 2 gm IV Q 4 hours  Indication: r/o meningitis   Renal Function:  Estimated Creatinine Clearance: 33.4 mL/min (A) (by C-G formula based on SCr of 1.3 mg/dL (H)). '[]'$      On intermittent HD, scheduled: '[]'$      On CRRT    Antimicrobial dosage has been changed to:  Ampicillin 2 gm IV Q 6 hours for decreased renal fx   Additional comments:   Thank you for allowing pharmacy to be a part of this patient's care.  Albertina Parr, PharmD., BCPS, BCCCP Clinical Pharmacist Please refer to [facility] for unit-specific pharmacist

## 2020-11-18 NOTE — ED Provider Notes (Signed)
[facility] EMERGENCY DEPARTMENT Provider Note   CSN: [id number] Arrival date & time: 12/09/2020  1028     History Chief Complaint  Patient presents with   Altered Mental Status    Margaret Rangel is a 75 y.o. female.   Altered Mental Status  75 year old female with a past medical history of diabetes, GERD, hypertension, CVA presenting to the emergency department due to unresponsiveness.  EMS report the patient was last seen normal by her daughter at 12:30 PM.  This morning, the patient was found on the floor and was unresponsive.  When EMS arrived, the patient had multiple seizure-like episodes that consisted of generalized clonic seizure-like episodes.  They state that she had 4 such episodes the last around 45 seconds.  They delivered 5 mg of midazolam and then she did not have any more seizure-like episodes.  She also had right-sided gaze deviation.  EMS note that she is on blood thinning medication.  Past Medical History:  Diagnosis Date   Abdominal pain    Anal fissure    Arthritis    Asthma    Carotid artery occlusion    Chronic back pain    Colon adenoma 2009, 2012   Colonoscopy   Diverticulosis of colon (without mention of hemorrhage) 2009   Colonoscopy   DM (diabetes mellitus) ([place])    off medicines for diabetes 12-18-13   GERD (gastroesophageal reflux disease)    Hemorrhoids    Hernia    HTN (hypertension)    Hyperlipidemia    low   Irritable bowel syndrome (IBS)    Low blood potassium    Obese    Stroke [facility])    x2   Ulcer     Patient Active Problem List   Diagnosis Date Noted   Status epilepticus ([place]) 12/04/2020   Encounter for medication review 08/21/2020   Encounter for immunization 12/30/2019   Peripheral artery disease ([place]) 12/30/2019   Ingrown left big toenail 08/25/2019   History of CVA (cerebrovascular accident) 02/27/2019   Hyperlipidemia    Chronic back pain 01/26/2019   Osteoarthritis of both knees 01/23/2018   GAD  (generalized anxiety disorder) 11/22/2016   Seasonal allergies 11/22/2016   Asthma, chronic 11/22/2016   History of stroke 09/13/2016   Diverticulosis of colon 09/13/2016   Osteoporosis 57/04/7791   Systolic murmur 90/30/0923   Essential hypertension 04/02/2016   Chest pain with moderate risk for cardiac etiology 03/07/2016   History of esophageal stricture 03/01/2016   Esophageal reflux 02/02/2014   Personal history of colonic polyps 12/09/2013   Dysphagia, unspecified(787.20) 09/11/2012   DM (diabetes mellitus) ([place]) 12/22/2010    Past Surgical History:  Procedure Laterality Date   BUBBLE STUDY  03/03/2019   Procedure: BUBBLE STUDY;  Surgeon: Pixie Casino, MD;  Location: [place];  Service: Cardiovascular;;   COLONOSCOPY     FLEXIBLE SIGMOIDOSCOPY N/A 07/14/2012   Procedure: Beryle Quant;  Surgeon: Inda Castle, MD;  Location: [facility] ENDOSCOPY;  Service: Endoscopy;  Laterality: N/A;   HEMORRHOID BANDING N/A 07/14/2012   Procedure: HEMORRHOID BANDING;  Surgeon: Inda Castle, MD;  Location: [facility] ENDOSCOPY;  Service: Endoscopy;  Laterality: N/A;   PARTIAL HYSTERECTOMY  1971   POLYPECTOMY     TEE WITHOUT CARDIOVERSION N/A 03/03/2019   Procedure: TRANSESOPHAGEAL ECHOCARDIOGRAM (TEE);  Surgeon: Pixie Casino, MD;  Location: [facility] ENDOSCOPY;  Service: Cardiovascular;  Laterality: N/A;   TONSILLECTOMY       OB History   No obstetric history  on file.     Family History  Problem Relation Age of Onset   Prostate cancer Brother    Heart disease Mother    Anuerysm Father    Lung cancer Brother    Colon cancer Brother    Healthy Child    Rectal cancer Neg Hx    Stomach cancer Neg Hx    Esophageal cancer Neg Hx     Social History   Tobacco Use   Smoking status: Former    Packs/day: 0.25    Years: 39.00    Pack years: 9.75    Types: Cigarettes    Start date: 04/16/1976    Quit date: 02/13/2016    Years since quitting: 4.7   Smokeless tobacco: Never    Tobacco comments:    4-5 cigs per day for 39 years  Vaping Use   Vaping Use: Never used  Substance Use Topics   Alcohol use: Not Currently    Alcohol/week: 21.0 standard drinks    Types: 21 Cans of beer per week    Comment: quit 2 years ago   Drug use: Yes    Types: Marijuana    Comment: using for depression// last used this morning 4am    Home Medications Prior to Admission medications   Medication Sig Start Date End Date Taking? Authorizing Provider  losartan (COZAAR) 25 MG tablet TAKE 1 TABLET BY MOUTH AT BEDTIME Patient taking differently: Take 25 mg by mouth at bedtime. 09/07/20  Yes Daisy Floro, DO  meloxicam (MOBIC) 7.5 MG tablet Take 7.5 mg by mouth daily. 11/08/20  Yes [provider]  metFORMIN (GLUCOPHAGE-XR) 500 MG 24 hr tablet Take 2 tablets (1,000 mg total) by mouth daily with breakfast. 06/09/20  Yes Milus Banister C, DO  pantoprazole (PROTONIX) 40 MG tablet Take 1 tablet by mouth once daily Patient taking differently: Take 40 mg by mouth daily. 09/08/20  Yes Milus Banister C, DO  rosuvastatin (CRESTOR) 40 MG tablet Take 1 tablet (40 mg total) by mouth daily. 09/05/20  Yes Daisy Floro, DO  traMADol (ULTRAM) 50 MG tablet Take 1 tablet (50 mg total) by mouth every 12 (twelve) hours as needed. Patient taking differently: Take 50 mg by mouth every 8 (eight) hours as needed for moderate pain. 10/19/20  Yes Brimage, Vondra, DO  albuterol (PROVENTIL HFA;VENTOLIN HFA) 108 (90 Base) MCG/ACT inhaler Inhale 2 puffs into the lungs every 6 (six) hours as needed for wheezing or shortness of breath. 06/16/18   Everrett Coombe, MD  Blood Glucose Calibration (ONETOUCH VERIO) High SOLN 1 each by In Vitro route as directed. 12/26/16   Dickie La, MD  Blood Glucose Calibration (ONETOUCH VERIO) SOLN 1 Bottle by In Vitro route as directed. 12/26/16   Dickie La, MD  Blood Glucose Monitoring Suppl (ONETOUCH VERIO) w/Device KIT 1 kit by Does not apply route 3 (three) times daily.  ICD-10 code: E11.9 12/26/16   Dickie La, MD  carvedilol (COREG) 3.125 MG tablet Take 1 tablet by mouth twice daily 05/09/19   Daisy Floro, DO  clopidogrel (PLAVIX) 75 MG tablet Take 1 tablet (75 mg total) by mouth daily. Please keep upcoming appt in April with Dr. Curt Bears before anymore refills. Thank you Patient not taking: No sig reported 06/01/19   Camnitz, Ocie Doyne, MD  diclofenac Sodium (VOLTAREN) 1 % GEL Apply 1 application topically 3 (three) times daily as needed (back pain).    [provider]  docusate sodium (COLACE) 100 MG  capsule Take 1 capsule (100 mg total) by mouth every 12 (twelve) hours. Patient taking differently: Take 100 mg by mouth 2 (two) times daily as needed (constipation). 11/04/18   Daisy Floro, DO  empagliflozin (JARDIANCE) 10 MG TABS tablet Take 1 tablet (10 mg total) by mouth daily. 06/01/20   Daisy Floro, DO  ezetimibe (ZETIA) 10 MG tablet Take 1 tablet (10 mg total) by mouth daily. 12/22/19   Daisy Floro, DO  Ferrous Sulfate (IRON) 325 (65 Fe) MG TABS Take 1 tablet (325 mg total) by mouth 2 (two) times daily with a meal. Patient not taking: Reported on 03/24/2020 01/26/19   Patriciaann Clan, DO  glucose blood (ONETOUCH VERIO) test strip Use as instructed to test glucose level three times daily. ICD-10 code: E11.9 06/16/18   Everrett Coombe, MD  Lancet Devices (ONE TOUCH DELICA LANCING DEV) MISC 1 Device by Does not apply route as directed. ICD-10 code: E11.9 12/26/16   Dickie La, MD  polyethylene glycol (MIRALAX / GLYCOLAX) 17 g packet Take 17 g by mouth daily. 11/04/18   Daisy Floro, DO    Allergies    Codeine and Penicillins  Review of Systems   Review of Systems  Unable to perform ROS: Patient unresponsive   Physical Exam Updated Vital Signs BP (!) 124/45   Pulse 100   Temp 99.9 F (37.7 C) (Axillary)   Resp 18   Ht '5\' 3"'  (1.6 m)   Wt 65 kg Comment: estimated based on previous weight  SpO2 99%   BMI 25.38 kg/m    Physical Exam Constitutional:      General: She is not in acute distress.    Appearance: She is ill-appearing. She is not toxic-appearing.  HENT:     Head: Normocephalic and atraumatic.     Nose: Nose normal. No congestion or rhinorrhea.     Mouth/Throat:     Mouth: Mucous membranes are moist.     Pharynx: No oropharyngeal exudate or posterior oropharyngeal erythema.     Comments: Multiple missing teeth Eyes:     Comments: Fixed right gaze deviation, pinpoint pupils  Cardiovascular:     Rate and Rhythm: Tachycardia present.  Pulmonary:     Breath sounds: Rhonchi present. No wheezing or rales.     Comments: Bilateral breath sounds, rhonchorous. Abdominal:     General: Abdomen is flat. There is no distension.     Tenderness: There is no abdominal tenderness. There is no guarding or rebound.  Musculoskeletal:        General: No deformity.     Cervical back: Neck supple. No rigidity.     Right lower leg: No edema.     Left lower leg: No edema.  Skin:    General: Skin is warm and dry.     Capillary Refill: Capillary refill takes less than 2 seconds.  Neurological:     Comments: Does not respond to verbal or noxious stimuli.  Does not move extremities spontaneously or open her eyes spontaneously.  No gag reflex.  Intermittently during exam, head will deviate to the right and she will exhibit fine tremors of the bilateral upper extremities.    ED Results / Procedures / Treatments   Labs (all labs ordered are listed, but only abnormal results are displayed) Labs Reviewed  CBC WITH DIFFERENTIAL/PLATELET - Abnormal; Notable for the following components:      Result Value   WBC 17.4 (*)    MCH 24.9 (*)  RDW 16.5 (*)    Neutro Abs 13.6 (*)    Monocytes Absolute 1.7 (*)    Abs Immature Granulocytes 0.09 (*)    All other components within normal limits  COMPREHENSIVE METABOLIC PANEL - Abnormal; Notable for the following components:   Potassium 3.2 (*)    CO2 19 (*)     Glucose, Bld 105 (*)    BUN 24 (*)    Creatinine, Ser 1.53 (*)    AST 97 (*)    GFR, Estimated 35 (*)    Anion gap 18 (*)    All other components within normal limits  LACTIC ACID, PLASMA - Abnormal; Notable for the following components:   Lactic Acid, Venous 3.9 (*)    All other components within normal limits  CK - Abnormal; Notable for the following components:   Total CK 4,572 (*)    All other components within normal limits  HEMOGLOBIN A1C - Abnormal; Notable for the following components:   Hgb A1c MFr Bld 7.1 (*)    All other components within normal limits  URINALYSIS, COMPLETE (UACMP) WITH MICROSCOPIC - Abnormal; Notable for the following components:   APPearance HAZY (*)    Hgb urine dipstick LARGE (*)    Ketones, ur 5 (*)    Protein, ur 100 (*)    Bacteria, UA RARE (*)    All other components within normal limits  GLUCOSE, CAPILLARY - Abnormal; Notable for the following components:   Glucose-Capillary 137 (*)    All other components within normal limits  I-STAT VENOUS BLOOD GAS, ED - Abnormal; Notable for the following components:   pCO2, Ven 35.0 (*)    pO2, Ven 53.0 (*)    Acid-base deficit 4.0 (*)    Potassium 3.0 (*)    Calcium, Ion 1.05 (*)    All other components within normal limits  I-STAT CHEM 8, ED - Abnormal; Notable for the following components:   Potassium 3.0 (*)    BUN 24 (*)    Creatinine, Ser 1.30 (*)    Glucose, Bld 109 (*)    Calcium, Ion 1.06 (*)    All other components within normal limits  CBG MONITORING, ED - Abnormal; Notable for the following components:   Glucose-Capillary 114 (*)    All other components within normal limits  I-STAT ARTERIAL BLOOD GAS, ED - Abnormal; Notable for the following components:   pO2, Arterial 374 (*)    Potassium 2.6 (*)    All other components within normal limits  TROPONIN I (HIGH SENSITIVITY) - Abnormal; Notable for the following components:   Troponin I (High Sensitivity) 2,524 (*)    All other  components within normal limits  TROPONIN I (HIGH SENSITIVITY) - Abnormal; Notable for the following components:   Troponin I (High Sensitivity) 1,761 (*)    All other components within normal limits  RESP PANEL BY RT-PCR (FLU A&B, COVID) ARPGX2  CULTURE, BLOOD (ROUTINE X 2)  CULTURE, BLOOD (ROUTINE X 2)  URINE CULTURE  CULTURE, RESPIRATORY W GRAM STAIN  BODY FLUID CULTURE W GRAM STAIN  VZV PCR, CSF  LACTIC ACID, PLASMA  PROCALCITONIN  BLOOD GAS, ARTERIAL  BODY FLUID CELL COUNT WITH DIFFERENTIAL  PROTEIN AND GLUCOSE, CSF  HSV 1/2 PCR, CSF  BASIC METABOLIC PANEL  BASIC METABOLIC PANEL  TRIGLYCERIDES  CBC  BLOOD GAS, ARTERIAL  PHOSPHORUS  MAGNESIUM  BASIC METABOLIC PANEL    EKG EKG Interpretation  Date/Time:  Friday November 18 2020 10:54:11 EDT Ventricular Rate:  101 PR Interval:  248 QRS Duration: 85 QT Interval:  365 QTC Calculation: 474 R Axis:   67 Text Interpretation: Sinus tachycardia Prolonged PR interval Consider right atrial enlargement Left ventricular hypertrophy Repol abnrm, severe global ischemia (LM/MVD) Confirmed by Davonna Belling [phone]) on 11/28/2020 3:24:24 PM  Radiology CT HEAD WO CONTRAST (5MM)  Result Date: 11/23/2020 CLINICAL DATA:  Mental status change, unknown cause. Patient had multiple seizures with AMS. EXAM: CT HEAD WITHOUT CONTRAST TECHNIQUE: Contiguous axial images were obtained from the base of the skull through the vertex without intravenous contrast. COMPARISON:  CT head 02/28/2019 FINDINGS: Brain: Multiple remote infarcts in the left cerebral hemisphere including 2 in the frontal lobe and 1 in the occipital lobe as well as small remote infarcts in the bilateral basal ganglia and left thalamus are unchanged. There is no evidence of acute intracranial hemorrhage, extra-axial fluid collection, or infarct. There is slight ex vacuo dilatation of the left lateral ventricle due to the above described infarcts. The ventricles are otherwise normal in  size and configuration. There is no midline shift. No mass lesion is identified. Vascular: There is calcification of the bilateral cavernous ICAs. Skull: Normal. Negative for fracture or focal lesion. Sinuses/Orbits: The imaged paranasal sinuses are clear. Bilateral lens implants are noted. The imaged orbits are otherwise unremarkable. Other: The mastoid air cells are clear. IMPRESSION: No acute intracranial pathology. 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 Electronically Signed   By: Valetta Mole MD   On: 12/01/2020 12:32   CT Cervical Spine Wo Contrast  Result Date: 11/28/2020 CLINICAL DATA:  Neck trauma, intoxicated or obtunded EXAM: CT CERVICAL SPINE WITHOUT CONTRAST TECHNIQUE: Multidetector CT imaging of the cervical spine was performed without intravenous contrast. Multiplanar CT image reconstructions were also generated. COMPARISON:  CTA neck 02/27/2019 FINDINGS: Alignment: There is reversal of the normal cervical spine lordosis. There is trace retrolisthesis of C3 on C4 for, unchanged and likely degenerative in nature. Skull base and vertebrae: Vertebral body heights are preserved. There is no evidence of acute fracture there is no suspicious osseous lesion. Soft tissues and spinal canal: No prevertebral fluid or swelling. No visible canal hematoma. There is bulky calcification of the ligamentum flavum at C7-T1. Disc levels: There is marked multilevel intervertebral disc space narrowing with vacuum disc phenomenon at C2-C3 and C5-C6. There is associated uncovertebral and facet arthropathy. There partially calcified disc protrusions at C3-C4 and C5-C6. Findings result in at least mild spinal canal stenosis and moderate bilateral neural foraminal stenosis at C3-C4 and C5-C6. Upper chest: The lung apices are clear. Other: Endotracheal and enteric catheters are noted,  incompletely imaged. There is bulky calcified atherosclerotic plaque of the bilateral carotid bulbs. IMPRESSION: No acute fracture or traumatic malalignment of the cervical spine. Multilevel degenerative changes as above, most advanced at C3-C4 and C5-C6. Electronically Signed   By: Valetta Mole MD   On: 12/02/2020 12:43   DG Chest Portable 1 View  Result Date: 11/29/2020 CLINICAL DATA:  Status post intubation. EXAM: PORTABLE CHEST 1 VIEW COMPARISON:  03/07/2016 FINDINGS: Tip of the ET tube is 1.2 cm above the carina. There is a NG tube with tip below the GE junction in the expected location of the gastric fundus. No pleural effusion or edema identified. No airspace opacities identified. Remote healed left posterior rib fractures are again seen. IMPRESSION: Satisfactory position of NG tube and ET tube. Lungs  are clear. Electronically Signed   By: Kerby Moors M.D.   On: 11/27/2020 11:24    Procedures Procedure Name: Intubation Date/Time: 11/29/2020 5:15 PM Performed by: Claud Kelp, MD Pre-anesthesia Checklist: Patient identified, Emergency Drugs available, Suction available, Patient being monitored and Timeout performed Oxygen Delivery Method: Non-rebreather mask Preoxygenation: Pre-oxygenation with 100% oxygen Induction Type: Rapid sequence Ventilation: Mask ventilation without difficulty Laryngoscope Size: Glidescope and 3 Grade View: Grade I Tube size: 7.5 mm Number of attempts: 1 Airway Equipment and Method: Stylet Placement Confirmation: ETT inserted through vocal cords under direct vision, CO2 detector and Breath sounds checked- equal and bilateral Secured at: 23 cm Tube secured with: ETT holder      Medications Ordered in ED Medications  fentaNYL (SUBLIMAZE) injection 50 mcg (has no administration in time range)  fentaNYL (SUBLIMAZE) injection 50 mcg (50 mcg Intravenous Given 11/27/2020 1650)  norepinephrine (LEVOPHED) 52m in 2532mpremix infusion (0 mcg/min Intravenous Stopped  11/29/2020 1126)  levETIRAcetam (KEPPRA) 750 mg in sodium chloride 0.9 % 100 mL IVPB (has no administration in time range)  propofol (DIPRIVAN) 1000 MG/100ML infusion (15 mcg/kg/min  65 kg Intravenous Restarted 11/19/2020 1255)  potassium chloride 10 mEq in 100 mL IVPB (10 mEq Intravenous New Bag/Given 12/07/2020 1614)  cefTRIAXone (ROCEPHIN) 2 g in sodium chloride 0.9 % 100 mL IVPB (0 g Intravenous Stopped 12/08/2020 1450)  acyclovir (ZOVIRAX) 650 mg in dextrose 5 % 100 mL IVPB (0 mg Intravenous Stopped 12/01/2020 1615)  lactated ringers infusion ( Intravenous New Bag/Given 12/03/2020 1431)  docusate sodium (COLACE) capsule 100 mg (has no administration in time range)  polyethylene glycol (MIRALAX / GLYCOLAX) packet 17 g (has no administration in time range)  insulin aspart (novoLOG) injection 0-9 Units (1 Units Subcutaneous Given 12/06/2020 1631)  pantoprazole (PROTONIX) injection 40 mg (has no administration in time range)  vancomycin (VANCOREADY) IVPB 750 mg/150 mL (has no administration in time range)  potassium chloride (KLOR-CON) packet 40 mEq (40 mEq Per Tube Given 12/01/2020 1610)  ampicillin (OMNIPEN) 2 g in sodium chloride 0.9 % 100 mL IVPB (2 g Intravenous New Bag/Given 12/05/2020 1631)  chlorhexidine gluconate (MEDLINE KIT) (PERIDEX) 0.12 % solution 15 mL (has no administration in time range)  MEDLINE mouth rinse (has no administration in time range)  LORazepam (ATIVAN) 2 MG/ML injection (  Given 12/10/2020 1032)  rocuronium (ZEMURON) injection (71 mg Intravenous Given 11/25/2020 1036)  etomidate (AMIDATE) injection (20 mg Intravenous Given 12/05/2020 1035)  levETIRAcetam (KEPPRA) IVPB 1000 mg/100 mL premix (0 mg Intravenous Stopped 11/28/2020 1402)  PHENYLephrine 40 mcg/ml in normal saline Adult IV Push Syringe (For Blood Pressure Support) ( Intravenous Not Given 11/20/2020 1048)  sodium chloride 0.9 % bolus 1,000 mL (0 mLs Intravenous Stopped 12/11/2020 1115)  vancomycin (VANCOREADY) IVPB 1250 mg/250 mL (0 mg Intravenous Stopped 12/05/2020  1403)  aspirin suppository 300 mg (300 mg Rectal Given 12/13/2020 1304)  ceFEPIme (MAXIPIME) 2 g in sodium chloride 0.9 % 100 mL IVPB (0 g Intravenous Stopped 12/08/2020 1325)  acetaminophen (TYLENOL) suppository 650 mg (650 mg Rectal Given 11/24/2020 1305)  levETIRAcetam (KEPPRA) 250 mg in sodium chloride 0.9 % 100 mL IVPB (0 mg Intravenous Stopped 11/26/2020 1455)    ED Course  I have reviewed the triage vital signs and the nursing notes.  Pertinent labs & imaging results that were available during my care of the patient were reviewed by me and considered in my medical decision making (see chart for details).    MDM Rules/Calculators/A&P  75 year old female with above past medical history presenting to the emergency department via EMS due to unresponsiveness and seizure-like activity.  Vital signs reviewed on arrival, patient is tachycardic, normotensive.  The patient is saturating normally on nonrebreather, but she has gurgling respirations and a depressed mental status.  The patient does not respond to noxious or verbal stimuli, she has no gag reflex.  She has active right gaze deviation intermittently.  I am concerned the patient is still seizing, therefore given 2 mg of IV benzodiazepines empirically with improvement of her gaze deviation and head deviation to the right.  Differential includes seizure, stroke, Todd's paralysis, sepsis, hypoglycemia.  Blood sugar was checked, within acceptable limits.  Due to her depressed mental status and lack of airway reflexes, the patient was intubated as detailed above in order to protect her airway.  Given Keppra bolus due to concern for status epilepticus.  Patient had postprocedural hypotension that was managed with 100 mcg of phenylephrine push x2 and then a norepinephrine infusion that was almost immediately titrated off as the patient's map improved as the RSI medications wore off.  Foley temperature returned persistently elevated at 101  F, therefore blood cultures obtained and broad-spectrum antibiotics given.  CT head without acute bleed.  Overall, patient presenting with acute respiratory failure in the setting of likely status epilepticus.  Placed on propofol infusion for sedation, which also was seizures.  Neurology was consulted in the emergency department and they applied EEG.  Intensivist consulted for admission to the ICU.  Final Clinical Impression(s) / ED Diagnoses Final diagnoses:  Seizures    Rx / DC Orders ED Discharge Orders     None        Claud Kelp, MD 12/05/2020 1719    Davonna Belling, MD 11/19/20 [phone]

## 2020-11-18 NOTE — Progress Notes (Signed)
Patient transported to 4N23 from ED without complications. RN at bedside.

## 2020-11-18 NOTE — Progress Notes (Signed)
Brief Neuro Follow up Note:  Spiked a fever here. Family reports lethargic yesterday. cEEG with left PLEDs and BIRDs.  Plan: - Keppra '750mg'$  BID - Propofol 11mg/kg/min - Agree with LP - Recommend meningitis coverage with Vanc, Ceftriaxone, Ampicillin and Acyclovir.  [place]ager Number [phone]

## 2020-11-18 NOTE — Consult Note (Addendum)
NEUROLOGY CONSULTATION NOTE   Date of service: November 18, 2020 Patient Name: Margaret Rangel MRN:  [id number] DOB:  04/23/1945 Reason for consult: "Multiple seizures and concern for status epilepticus" Requesting Provider: Davonna Belling, MD _ _ _   _ __   _ __ _ _  __ __   _ __   __ _  History of Present Illness  Margaret Rangel is a 75 y.o. female with PMH significant for arthritis, diverticulosis, diabetes, reflux disease, hemorrhoids, hypertension, hyperlipidemia, obesity, prior strokes in the left frontal cortical, other lacunar and basal ganglia infarcts, who was essentially found unresponsive on the floor by her daughter today and had multiple seizures with EMS, at least 5 seizures.  She was given 5 mg of midazolam in route.  Seizures were lasting 10 to 20 seconds only with right forced gaze deviation right head twitching and right upper extremity twitching.  She was given Ativan in the ED and loaded up with Keppra and intubated.  Patient is unable to provide any meaningful history secondary to her being intubated and sedated.  I spoke to patient's daughter at the bedside she reports the patient was last seen at her baseline last night.  She came into the home today and found her on the floor unresponsive.  She turned her over and noted that she went into a seizure.  She called EMS right away.  Patient was noted to be at her baseline and nothing was off about her over the last few days.  She had been reporting of right leg pain and for that was put on tramadol and meloxicam that seem to help.  Patient did not report any headache, no fevers no chills, no neck pain, she did not appear lethargic or tired over the last few days.  She does not drink alcohol.  No significant history of head injury.  No prior seizures.  No family history of seizures.   ROS  Unable to obtain as patient is unresponsive and being intubated Past History   Past Medical History:  Diagnosis Date   Abdominal pain     Anal fissure    Arthritis    Asthma    Carotid artery occlusion    Chronic back pain    Colon adenoma 2009, 2012   Colonoscopy   Diverticulosis of colon (without mention of hemorrhage) 2009   Colonoscopy   DM (diabetes mellitus) ([place])    off medicines for diabetes 12-18-13   GERD (gastroesophageal reflux disease)    Hemorrhoids    Hernia    HTN (hypertension)    Hyperlipidemia    low   Irritable bowel syndrome (IBS)    Low blood potassium    Obese    Stroke [facility])    x2   Ulcer    Past Surgical History:  Procedure Laterality Date   BUBBLE STUDY  03/03/2019   Procedure: BUBBLE STUDY;  Surgeon: Pixie Casino, MD;  Location: [facility];  Service: Cardiovascular;;   COLONOSCOPY     FLEXIBLE SIGMOIDOSCOPY N/A 07/14/2012   Procedure: FLEXIBLE SIGMOIDOSCOPY;  Surgeon: Inda Castle, MD;  Location: [facility];  Service: Endoscopy;  Laterality: N/A;   HEMORRHOID BANDING N/A 07/14/2012   Procedure: HEMORRHOID BANDING;  Surgeon: Inda Castle, MD;  Location: [facility];  Service: Endoscopy;  Laterality: N/A;   PARTIAL HYSTERECTOMY  1971   POLYPECTOMY     TEE WITHOUT CARDIOVERSION N/A 03/03/2019   Procedure: TRANSESOPHAGEAL ECHOCARDIOGRAM (TEE);  Surgeon: Pixie Casino, MD;  Location: [facility];  Service: Cardiovascular;  Laterality: N/A;   TONSILLECTOMY     Family History  Problem Relation Age of Onset   Prostate cancer Brother    Heart disease Mother    Anuerysm Father    Lung cancer Brother    Colon cancer Brother    Healthy Child    Rectal cancer Neg Hx    Stomach cancer Neg Hx    Esophageal cancer Neg Hx    Social History   Socioeconomic History   Marital status: Single    Spouse name: Not on file   Number of children: 3   Years of education: Not on file   Highest education level: Not on file  Occupational History   Occupation: UNEMPLOYED    Employer: UNEMPLOYED  Tobacco Use   Smoking status: Former    Packs/day: 0.25    Years: 39.00     Pack years: 9.75    Types: Cigarettes    Start date: 04/16/1976    Quit date: 02/13/2016    Years since quitting: 4.7   Smokeless tobacco: Never   Tobacco comments:    4-5 cigs per day for 39 years  Vaping Use   Vaping Use: Never used  Substance and Sexual Activity   Alcohol use: Not Currently    Alcohol/week: 21.0 standard drinks    Types: 21 Cans of beer per week    Comment: quit 2 years ago   Drug use: Yes    Types: Marijuana    Comment: using for depression// last used this morning 4am   Sexual activity: Not Currently  Other Topics Concern   Not on file  Social History Narrative   Patient lives alone in [place].    Patients daughter helps her a lot post stroke.    Patients daughter runs errands for her and takes her to doctors apt.    Patient enjoys seeing her family and reading the bible.    Social Determinants of Health   Financial Resource Strain: Low Risk    Difficulty of Paying Living Expenses: Not hard at all  Food Insecurity: No Food Insecurity   Worried About Charity fundraiser in the Last Year: Never true   [place] in the Last Year: Never true  Transportation Needs: No Transportation Needs   Lack of Transportation (Medical): No   Lack of Transportation (Non-Medical): No  Physical Activity: Inactive   Days of Exercise per Week: 0 days   Minutes of Exercise per Session: 0 min  Stress: No Stress Concern Present   Feeling of Stress : Only a little  Social Connections: Moderately Isolated   Frequency of Communication with Friends and Family: More than three times a week   Frequency of Social Gatherings with Friends and Family: More than three times a week   Attends Religious Services: More than 4 times per year   Active Member of [employer] or Organizations: No   Attends Archivist Meetings: Never   Marital Status: Never married   Allergies  Allergen Reactions   Codeine Itching   Penicillins Itching    Has patient had a PCN reaction causing  immediate rash, facial/tongue/throat swelling, SOB or lightheadedness with hypotension:No Has patient had a PCN reaction causing severe rash involving mucus membranes or skin necrosis:No Has patient had a PCN reaction that required hopititalization:No Has patient had a PCN reaction occurring within the last 10 years:No If all of the above answers are "NO", then may proceed with  Cephalosporin use.     Medications  (Not in a hospital admission)    Vitals   Vitals:   11/17/2020 1040 11/17/2020 1041 11/29/2020 1042 11/20/2020 1045  BP: (!) 55/36  (!) 47/28   Pulse: (!) 105 (!) 106    Resp:  18    SpO2: 100%   100%  Weight:      Height:    '5\' 3"'$  (1.6 m)     Body mass index is 25.38 kg/m.  Physical Exam   General: Laying in bed with R head turn and twithcing, being prepared to be intubated. HENT: Normal oropharynx and mucosa. Normal external appearance of ears and nose. Neck: Supple, no pain or tenderness CV: No JVD. No peripheral edema. Pulmonary: poor respiratory status. Ext: No cyanosis, edema, or deformity Skin: No rash. Normal palpation of skin.  Musculoskeletal: Normal digits and nails by inspection. No clubbing.  Neurologic Examination  Mental status/Cognition: No response to voice or to noxious stimuli.  No response to nares stimulation with a Q-tip.  Brainstem reflexes: Corneals: Intact bilaterally  pupils: Unable to assess as her eyes are deviated to the extreme right. Gag: Weak gag noted by the ED doctor. Cough: Unable to assess for the nonbreather in place and patient being prepared for intubation.   Motor:  Muscle bulk: poor, tone increased in RUE with rhythmic jerking of RUE and R head turn. Flaccid tone noted in left upper extremity and bilateral lower extremities. No response to noxious stimuli in any of the extremities.  Coordination/Complex Motor:  Unable to assess.  Labs   CBC:  Recent Labs  Lab 12/06/2020 1039  HGB 13.3  13.6  HCT 39.0  40.0     Basic Metabolic Panel:  Lab Results  Component Value Date   NA 143 12/14/2020   NA 143 11/29/2020   K 3.0 (L) 12/14/2020   K 3.0 (L) 11/26/2020   CO2 23 08/11/2019   GLUCOSE 109 (H) 12/05/2020   BUN 24 (H) 11/21/2020   CREATININE 1.30 (H) 12/07/2020   CALCIUM 9.5 08/11/2019   GFRNONAA 54 (L) 08/11/2019   GFRAA 62 08/11/2019   Lipid Panel:  Lab Results  Component Value Date   LDLCALC 54 08/11/2019   HgbA1c:  Lab Results  Component Value Date   HGBA1C 6.4 08/17/2020   Urine Drug Screen:     Component Value Date/Time   LABOPIA NONE DETECTED 09/02/2018 1211   COCAINSCRNUR NONE DETECTED 09/02/2018 1211   COCAINSCRNUR NEG 04/11/2010 2024   LABBENZ NONE DETECTED 09/02/2018 1211   LABBENZ NEG 04/11/2010 2024   AMPHETMU NONE DETECTED 09/02/2018 1211   THCU POSITIVE (A) 09/02/2018 1211   LABBARB NONE DETECTED 09/02/2018 1211    Alcohol Level     Component Value Date/Time   ETH <10 09/02/2018 0923    CT Head without contrast: Personally reviewed and CTH was negative for a large hypodensity concerning for a large territory infarct or hyperdensity concerning for an ICH.  MRI Brain: pending  cEEG:  pending  Impression   Margaret Rangel is a 75 y.o. female with PMH significant for prior stroke who presented via EMS with unresponsiveness and seizures. Had back to back seizures with no return to baseline in between and concern for status epilepticus. Was given 5 of Versed by EMS, ativan here, intubated, loaded with Keppra and started on propofol and cEEG. Her neurologic examination prior to intubation with comatose, no response to voice, no response to nare stimulation, eyes deviated to the  right with r head twtiching and RUE twtiching. No response to pain in any of the extremities.  Workup so far with labs with tachycardia, hypotension on arrival, no fever. Labs with hypokalemia and elevated creatinine. No hypo or hyperglycemia. Cbc is pending.  Recommendations  -  cEEG - Keppra load of 2G IV once - continue Keppra '500mg'$  BID - MRI Brain after cEEG - Further AEDs based on cEEG.  ______________________________________________________________________  This patient is critically ill and at significant risk of neurological worsening, death and care requires constant monitoring of vital signs, hemodynamics,respiratory and cardiac monitoring, neurological assessment, discussion with family, other specialists and medical decision making of high complexity. I spent 60 minutes of neurocritical care time  in the care of  this patient. This was time spent independent of any time provided by nurse practitioner or PA.  Donnetta Simpers [facility] Pager Number [phone] 11/24/2020  11:56 AM  Thank you for the opportunity to take part in the care of this patient. If you have any further questions, please contact the neurology consultation attending.  Signed,  [place] Pager Number [phone] _ _ _   _ __   _ __ _ _  __ __   _ __   __ _

## 2020-11-18 NOTE — Procedures (Signed)
Lumbar Puncture Procedure Note  JEWELIANNA BRAUM  [id number]  [birth year]  Date:12/09/2020  Time:5:03 PM   Provider Performing:Sage Hammill   Procedure: Lumbar Puncture [id number])  Indication(s) Rule out meningitis  Consent Risks of the procedure as well as the alternatives and risks of each were explained to the patient and/or caregiver.  Consent for the procedure was obtained and is signed in the bedside chart  Anesthesia Topical only with 1% lidocaine    Time Out Verified patient identification, verified procedure, site/side was marked, verified correct patient position, special equipment/implants available, medications/allergies/relevant history reviewed, required imaging and test results available.   Sterile Technique Maximal sterile technique including sterile barrier drape, hand hygiene, sterile gown, sterile gloves, mask, hair covering.    Procedure Description Using palpation, approximate location of L3-L4 space identified.   Lidocaine used to anesthetize skin and subcutaneous tissue overlying this area.  A 20g spinal needle was then used to access the subarachnoid space. Opening pressure:Not obtained. Closing pressure:Not obtained. 15 cc clear CSF obtained.  Complications/Tolerance None; patient tolerated the procedure well.   EBL Minimal   Specimen(s) CSF

## 2020-11-19 ENCOUNTER — Inpatient Hospital Stay (HOSPITAL_COMMUNITY): Payer: Medicare Other

## 2020-11-19 ENCOUNTER — Inpatient Hospital Stay: Payer: Self-pay

## 2020-11-19 DIAGNOSIS — N179 Acute kidney failure, unspecified: Secondary | ICD-10-CM | POA: Diagnosis not present

## 2020-11-19 DIAGNOSIS — G40901 Epilepsy, unspecified, not intractable, with status epilepticus: Secondary | ICD-10-CM | POA: Diagnosis not present

## 2020-11-19 DIAGNOSIS — J9601 Acute respiratory failure with hypoxia: Secondary | ICD-10-CM | POA: Diagnosis not present

## 2020-11-19 DIAGNOSIS — G039 Meningitis, unspecified: Secondary | ICD-10-CM

## 2020-11-19 LAB — GLUCOSE, CAPILLARY
Glucose-Capillary: 115 mg/dL — ABNORMAL HIGH (ref 70–99)
Glucose-Capillary: 138 mg/dL — ABNORMAL HIGH (ref 70–99)
Glucose-Capillary: 119 mg/dL — ABNORMAL HIGH (ref 70–99)
Glucose-Capillary: 109 mg/dL — ABNORMAL HIGH (ref 70–99)
Glucose-Capillary: 108 mg/dL — ABNORMAL HIGH (ref 70–99)
Glucose-Capillary: 136 mg/dL — ABNORMAL HIGH (ref 70–99)

## 2020-11-19 LAB — CBC
HCT: 32.9 % — ABNORMAL LOW (ref 36.0–46.0)
Hemoglobin: 10.7 g/dL — ABNORMAL LOW (ref 12.0–15.0)
MCH: 25.9 pg — ABNORMAL LOW (ref 26.0–34.0)
MCHC: 32.5 g/dL (ref 30.0–36.0)
MCV: 79.7 fL — ABNORMAL LOW (ref 80.0–100.0)
Platelets: 239 10*3/uL (ref 150–400)
RBC: 4.13 MIL/uL (ref 3.87–5.11)
RDW: 16.6 % — ABNORMAL HIGH (ref 11.5–15.5)
WBC: 14.7 10*3/uL — ABNORMAL HIGH (ref 4.0–10.5)
nRBC: 0 % (ref 0.0–0.2)

## 2020-11-19 LAB — BLOOD CULTURE ID PANEL (REFLEXED) - BCID2

## 2020-11-19 LAB — BASIC METABOLIC PANEL
Anion gap: 13 (ref 5–15)
Anion gap: 11 (ref 5–15)
BUN: 21 mg/dL (ref 8–23)
BUN: 13 mg/dL (ref 8–23)
CO2: 21 mmol/L — ABNORMAL LOW (ref 22–32)
CO2: 21 mmol/L — ABNORMAL LOW (ref 22–32)
Calcium: 8.4 mg/dL — ABNORMAL LOW (ref 8.9–10.3)
Calcium: 7.7 mg/dL — ABNORMAL LOW (ref 8.9–10.3)
Chloride: 109 mmol/L (ref 98–111)
Chloride: 107 mmol/L (ref 98–111)
Creatinine, Ser: 1.14 mg/dL — ABNORMAL HIGH (ref 0.44–1.00)
Creatinine, Ser: 0.99 mg/dL (ref 0.44–1.00)
GFR, Estimated: 50 mL/min — ABNORMAL LOW (ref >60)
GFR, Estimated: 59 mL/min — ABNORMAL LOW (ref >60)
Glucose, Bld: 140 mg/dL — ABNORMAL HIGH (ref 70–99)
Glucose, Bld: 118 mg/dL — ABNORMAL HIGH (ref 70–99)
Potassium: 4.1 mmol/L (ref 3.5–5.1)
Potassium: 3.4 mmol/L — ABNORMAL LOW (ref 3.5–5.1)
Sodium: 143 mmol/L (ref 135–145)
Sodium: 139 mmol/L (ref 135–145)

## 2020-11-19 LAB — URINE CULTURE: Culture: NO GROWTH

## 2020-11-19 LAB — STREP PNEUMONIAE URINARY ANTIGEN: Strep Pneumo Urinary Antigen: NEGATIVE

## 2020-11-19 LAB — POCT I-STAT 7, (LYTES, BLD GAS, ICA,H+H)
Acid-base deficit: 3 mmol/L — ABNORMAL HIGH (ref 0.0–2.0)
Bicarbonate: 20.4 mmol/L (ref 20.0–28.0)
Calcium, Ion: 1.21 mmol/L (ref 1.15–1.40)
HCT: 30 % — ABNORMAL LOW (ref 36.0–46.0)
Hemoglobin: 10.2 g/dL — ABNORMAL LOW (ref 12.0–15.0)
O2 Saturation: 95 %
Patient temperature: 99.9
Potassium: 3.7 mmol/L (ref 3.5–5.1)
Sodium: 144 mmol/L (ref 135–145)
TCO2: 21 mmol/L — ABNORMAL LOW (ref 22–32)
pCO2 arterial: 32.1 mmHg (ref 32.0–48.0)
pH, Arterial: 7.414 (ref 7.350–7.450)
pO2, Arterial: 79 mmHg — ABNORMAL LOW (ref 83.0–108.0)

## 2020-11-19 LAB — PHOSPHORUS: Phosphorus: 2.3 mg/dL — ABNORMAL LOW (ref 2.5–4.6)

## 2020-11-19 LAB — MRSA NEXT GEN BY PCR, NASAL: MRSA by PCR Next Gen: NOT DETECTED

## 2020-11-19 LAB — TRIGLYCERIDES: Triglycerides: 189 mg/dL — ABNORMAL HIGH (ref <150)

## 2020-11-19 LAB — MAGNESIUM: Magnesium: 2 mg/dL (ref 1.7–2.4)

## 2020-11-19 MED ORDER — SODIUM CHLORIDE 0.9% FLUSH
10.0000 mL | INTRAVENOUS | Status: DC | PRN
  Administered 2020-11-19: 20 mL

## 2020-11-19 MED ORDER — VALPROATE SODIUM 100 MG/ML IV SOLN
1000.0000 mg | Freq: Once | INTRAVENOUS | Status: AC
  Administered 2020-11-19: 1000 mg via INTRAVENOUS
  Filled 2020-11-19: qty 10

## 2020-11-19 MED ORDER — LACTATED RINGERS IV SOLN
INTRAVENOUS | Status: DC
Start: 2020-11-19 — End: 2020-11-22

## 2020-11-19 MED ORDER — LEVETIRACETAM IN NACL 1500 MG/100ML IV SOLN
1500.0000 mg | Freq: Two times a day (BID) | INTRAVENOUS | Status: DC
  Administered 2020-11-19 – 2020-11-23 (×9): 1500 mg via INTRAVENOUS
  Filled 2020-11-19 (×9): qty 100

## 2020-11-19 MED ORDER — SODIUM CHLORIDE 0.9 % IV SOLN
750.0000 mg | Freq: Once | INTRAVENOUS | Status: AC
  Administered 2020-11-19: 750 mg via INTRAVENOUS
  Filled 2020-11-19: qty 7.5

## 2020-11-19 MED ORDER — LORAZEPAM 2 MG/ML IJ SOLN
4.0000 mg | Freq: Once | INTRAMUSCULAR | Status: AC
  Filled 2020-11-19: qty 2

## 2020-11-19 MED ORDER — CHLORHEXIDINE GLUCONATE CLOTH 2 % EX PADS
6.0000 | MEDICATED_PAD | Freq: Every day | CUTANEOUS | Status: DC
  Administered 2020-11-19 – 2020-11-29 (×11): 6 via TOPICAL

## 2020-11-19 MED ORDER — SODIUM CHLORIDE 0.9 % IV SOLN
15.0000 mg/kg | Freq: Once | INTRAVENOUS | Status: AC
  Administered 2020-11-19: 978 mg via INTRAVENOUS
  Filled 2020-11-19: qty 19.56

## 2020-11-19 MED ORDER — LORAZEPAM 2 MG/ML IJ SOLN
INTRAMUSCULAR | Status: AC
  Administered 2020-11-19: 4 mg via INTRAVENOUS
  Filled 2020-11-19: qty 1

## 2020-11-19 MED ORDER — DEXMEDETOMIDINE HCL IN NACL 400 MCG/100ML IV SOLN: 0.4000 ug/kg/h | INTRAVENOUS | Status: DC

## 2020-11-19 MED ORDER — VALPROATE SODIUM 100 MG/ML IV SOLN
250.0000 mg | Freq: Four times a day (QID) | INTRAVENOUS | Status: DC
  Administered 2020-11-19 – 2020-11-21 (×6): 250 mg via INTRAVENOUS
  Filled 2020-11-19 (×8): qty 2.5

## 2020-11-19 MED ORDER — PROPOFOL 1000 MG/100ML IV EMUL
40.0000 ug/kg/min | INTRAVENOUS | Status: DC
  Administered 2020-11-19: 40 ug/kg/min via INTRAVENOUS
  Administered 2020-11-19 (×2): 15 ug/kg/min via INTRAVENOUS
  Administered 2020-11-19 – 2020-11-21 (×14): 70 ug/kg/min via INTRAVENOUS
  Administered 2020-11-22: 40 ug/kg/min via INTRAVENOUS
  Administered 2020-11-22: 70 ug/kg/min via INTRAVENOUS
  Administered 2020-11-22: 40 ug/kg/min via INTRAVENOUS
  Administered 2020-11-22 (×2): 70 ug/kg/min via INTRAVENOUS
  Administered 2020-11-22 – 2020-11-25 (×10): 40 ug/kg/min via INTRAVENOUS
  Administered 2020-11-25: 30 ug/kg/min via INTRAVENOUS
  Administered 2020-11-25: 40 ug/kg/min via INTRAVENOUS
  Filled 2020-11-19 (×34): qty 100

## 2020-11-19 MED ORDER — SODIUM CHLORIDE 0.9% FLUSH
10.0000 mL | Freq: Two times a day (BID) | INTRAVENOUS | Status: DC
  Administered 2020-11-19 – 2020-11-20 (×2): 20 mL
  Administered 2020-11-20 – 2020-11-22 (×4): 10 mL
  Administered 2020-11-22: 20 mL
  Administered 2020-11-23 – 2020-11-29 (×12): 10 mL

## 2020-11-19 NOTE — Progress Notes (Signed)
EEG maintenance complete. No skin breakdown at [place], continue to monitor

## 2020-11-19 NOTE — Progress Notes (Signed)
   11/19/20 2000  Seizure Activity  Psychomotor Symptoms None  Motor Component Stiff;Right;Left;Arm;Leg  Duration Every 1-2 minutes w/o stimulation  Interventions Notified MD/Provider;Medication  Tasking (Response) Unable to respond    MD made aware at shift change, 1900, that patient was having periods of stiffness in arms and legs every 1-2 minutes without external stimulation. Neuro MD came to bedside to assess patient. New order to increase propofol from 15 mcg/kg/min to 50 mcg/kg/min.  At 2000 patient still having episodes of stiffness in all 4 extremities without external stimulation. Verbal order to increase propofol from 50 mcg/kg/min to 70 mcg/kg/min. Will continue to monitor

## 2020-11-19 NOTE — Procedures (Signed)
Patient Name: Margaret Rangel  MRN: [id number]  Epilepsy Attending: Lora Havens  Referring Physician/Provider: Dr Donnetta Simpers Duration: 11/28/2020 1120 to 11/19/2020 1120  Patient history:  75 y.o. female with PMH significant for prior stroke who presented via EMS with unresponsiveness and seizures. Had back to back seizures with no return to baseline in between. EEG due to concern for status epilepticus.   Level of alertness:  comatose  AEDs during EEG study: LEV, VPA, propofol  Technical aspects: This EEG study was done with scalp electrodes positioned according to the 10-20 International system of electrode placement. Electrical activity was acquired at a sampling rate of '500Hz'$  and reviewed with a high frequency filter of '70Hz'$  and a low frequency filter of '1Hz'$ . EEG data were recorded continuously and digitally stored.   Description: EEG showed continuous generalized and lateralized left hemisphere 3 to 6 Hz theta-delta slowing. Lateralized periodic discharges ( LPDs) were also in left hemisphere every 5 seconds. At the beginning of study , LPDs at times appeared rhythmic lasting 2-3 seconds consistent with brief ictal-interictal discharges ( BIRDS). Gradually the frequency of LPDs improved and the EEG showed spikes arising from left hemisphere every 10-15 seconds followed by rhythmic polymorphic generalized 3-'5hz'$  theta-delta slowing followed  by 2-3 seconds of generalized attenuation. After around 0445 on 11/19/2020, eeg showed generalized spikes with shifting left and right frontal predominance. Gradually, after propofol was weaned off eeg showed seizures without clinical signs arising from left hemisphere, avg 1-2 /hour, avg 1 minute. Propofol was resumed and EEG again showed intermittent generalized 3-'5hz'$  theta-delta slowing lasting 1-3 seconds and eeg attenuation lasting 1-3 seconds.   Event button was pressed on 11/19/2020 at 0907. Per Dr Lorrin Goodell patient has R gaze deviation, Rightwards  head twitching and RUE twitching. Concomitant eeg showed 5-'6hz'$  theta slowing in left hemisphere which them spread to right hemisphere and evolved into 2-'3hz'$  delta slowing consistent with seizure.   ABNORMALITY - Seizure without clinical sign, left hemisphere - Lateralized periodic discharges ( LPD ) left hemisphere - Brief ictal-interictal discharges ( BIRDS), left hemisphere -Spike,generalized - Intermittent rhythmic slow, generalized - Background attenuation, generalized - Continuous slow, generalized  IMPRESSION: This study was initially suggestive of epileptogenicity and cortical dysfunction arising from left hemisphere with high potential for seizure recurrence.  After titrating AEDs, EEG improved. However, on 11/19/2020 after around 0445, EEG showed epileptogenicity with generalized onset and shifting predominance in left and right frontal region. Additionally, there was rhythmic slowing which is on the ictal-interictal continuum with low potential for seizure recurrence. On 11/19/2020, after propofol was weaned, eeg showed one focal seizure at 0907 as described above as well as seizures without clinical signs, arising from left hemisphere, avg 1-2/hour, lasting about 1 minute. Propofol,was resumed again after which eeg was suggestive of moderate diffuse encephalopathy, non specific etiology but likely due to seizure and sedation.    [name]

## 2020-11-19 NOTE — Progress Notes (Signed)
Lauren RN aware PICC ready to use and to remove all PIV's, change tubings.

## 2020-11-19 NOTE — Progress Notes (Signed)
PICC line ordered with pending blood cultures and febrile.  Will proceed per MD order.

## 2020-11-19 NOTE — Progress Notes (Addendum)
PHARMACY - PHYSICIAN COMMUNICATION CRITICAL VALUE ALERT - BLOOD CULTURE IDENTIFICATION (BCID)  Margaret Rangel is an 75 y.o. female who presented to [facility] on 11/20/2020 with a chief complaint of found unresponsive.  Assessment:  24 yof admitted for work-up of meningitis/encephalitis and on abx. BCID shows 1 of 4 bottles with CoNS which is likely a contaminant but will continue to follow cultures.  Name of physician (or Provider) Contacted: Dr Tacy Learn  Current antibiotics: vancomycin, ceftriaxone, ampicillin  Changes to prescribed antibiotics recommended:  Patient is on recommended antibiotics - No changes needed  Results for orders placed or performed during the hospital encounter of 11/26/2020  Blood Culture ID Panel (Reflexed) (Collected: 12/06/2020 12:34 PM)  Result Value Ref Range   Enterococcus faecalis NOT DETECTED NOT DETECTED   Enterococcus Faecium NOT DETECTED NOT DETECTED   Listeria monocytogenes NOT DETECTED NOT DETECTED   Staphylococcus species DETECTED (A) NOT DETECTED   Staphylococcus aureus (BCID) NOT DETECTED NOT DETECTED   Staphylococcus epidermidis NOT DETECTED NOT DETECTED   Staphylococcus lugdunensis NOT DETECTED NOT DETECTED   Streptococcus species NOT DETECTED NOT DETECTED   Streptococcus agalactiae NOT DETECTED NOT DETECTED   Streptococcus pneumoniae NOT DETECTED NOT DETECTED   Streptococcus pyogenes NOT DETECTED NOT DETECTED   A.calcoaceticus-baumannii NOT DETECTED NOT DETECTED   Bacteroides fragilis NOT DETECTED NOT DETECTED   Enterobacterales NOT DETECTED NOT DETECTED   Enterobacter cloacae complex NOT DETECTED NOT DETECTED   Escherichia coli NOT DETECTED NOT DETECTED   Klebsiella aerogenes NOT DETECTED NOT DETECTED   Klebsiella oxytoca NOT DETECTED NOT DETECTED   Klebsiella pneumoniae NOT DETECTED NOT DETECTED   Proteus species NOT DETECTED NOT DETECTED   Salmonella species NOT DETECTED NOT DETECTED   Serratia marcescens NOT DETECTED NOT DETECTED    Haemophilus influenzae NOT DETECTED NOT DETECTED   Neisseria meningitidis NOT DETECTED NOT DETECTED   Pseudomonas aeruginosa NOT DETECTED NOT DETECTED   Stenotrophomonas maltophilia NOT DETECTED NOT DETECTED   Candida albicans NOT DETECTED NOT DETECTED   Candida auris NOT DETECTED NOT DETECTED   Candida glabrata NOT DETECTED NOT DETECTED   Candida krusei NOT DETECTED NOT DETECTED   Candida parapsilosis NOT DETECTED NOT DETECTED   Candida tropicalis NOT DETECTED NOT DETECTED   Cryptococcus neoformans/gattii NOT DETECTED NOT DETECTED    Thank you for involving pharmacy in this patient's care.  Renold Genta, PharmD, BCPS Clinical Pharmacist Clinical phone for 11/19/2020 until 3p is [phone] 11/19/2020 7:25 AM  **Pharmacist phone directory can be found on [email or web address] listed under [place]**

## 2020-11-19 NOTE — Progress Notes (Signed)
NEUROLOGY CONSULTATION PROGRESS NOTE   Date of service: November 19, 2020 Patient Name: Margaret Rangel MRN:  [id number] DOB:  11/20/1945  Brief HPI   Diannia ZI PLETT is a 75 y.o. female with PMH significant for prior stroke who presented via EMS with unresponsiveness and seizures. Had back to back seizures with no return to baseline in between and concern for status epilepticus.  cEEG with left PLEDS and BIRDs. On Keppra, Propofol.   Interval Hx   Attempted weaning propofol, noted to have seizures clinically with R gaze deviation, Rightwards head twitching and RUE twitching. Was given Ativan '4mg'$  and resumed propofol.  Vitals   Vitals:   11/19/20 0929 11/19/20 1000 11/19/20 1111 11/19/20 1112  BP:  (!) 109/47 (!) 119/50   Pulse:  98 95   Resp:  18 18   Temp:  (!) 100.58 F (38.1 C) 99.86 F (37.7 C)   TempSrc:      SpO2: 99% 100% 100% 100%  Weight:      Height:         Body mass index is 25.46 kg/m.  Physical Exam   General: Laying in bed, noted to have subtle clinical seizure when weaned off propofol with Rightwards head twitching and RUE twitching with R gaze deviation. HENT: Normal oropharynx and mucosa. Normal external appearance of ears and nose. Neck: Supple, no pain or tenderness CV: No JVD. No peripheral edema. Pulmonary: poor respiratory status. Ext: No cyanosis, edema, or deformity Skin: No rash. Normal palpation of skin. Musculoskeletal: Normal digits and nails by inspection. No clubbing.   Neurologic Examination  Mental status/Cognition: No response to voice or to noxious stimuli.  No response to nares stimulation with a Q-tip.  Brainstem reflexes: Corneals: Intact bilaterally pupils: 24m bilaterally and reactive to light. Gag: absent. Cough: absent.    Motor:  Muscle bulk: poor, tone flaccid throughout after ativan given. Spontaneous movements noted in all extremities. Withdrawal to noxious stimuli in LUE and LLE. No response to noxious stimuli in  RUE and RLE.   Coordination/Complex Motor:  Unable to assess.  Labs   Basic Metabolic Panel:  Lab Results  Component Value Date   NA 143 11/19/2020   K 4.1 11/19/2020   CO2 21 (L) 11/19/2020   GLUCOSE 140 (H) 11/19/2020   BUN 21 11/19/2020   CREATININE 1.14 (H) 11/19/2020   CALCIUM 8.4 (L) 11/19/2020   GFRNONAA 50 (L) 11/19/2020   GFRAA 62 08/11/2019   HbA1c:  Lab Results  Component Value Date   HGBA1C 7.1 (H) 11/16/2020   LDL:  Lab Results  Component Value Date   LDLCALC 54 08/11/2019   Urine Drug Screen:     Component Value Date/Time   LABOPIA NONE DETECTED 09/02/2018 1211   COCAINSCRNUR NONE DETECTED 09/02/2018 1211   COCAINSCRNUR NEG 04/11/2010 2024   LABBENZ NONE DETECTED 09/02/2018 1211   LABBENZ NEG 04/11/2010 2024   AMPHETMU NONE DETECTED 09/02/2018 1211   THCU POSITIVE (A) 09/02/2018 1211   LABBARB NONE DETECTED 09/02/2018 1211    Alcohol Level     Component Value Date/Time   ETH <10 09/02/2018 0923   No results found for: PHENYTOIN, ZONISAMIDE, LAMOTRIGINE, LEVETIRACETA No results found for: PHENYTOIN, PHENOBARB, VALPROATE, CBMZ  Imaging and Diagnostic studies   CT Head without contrast: Personally reviewed and CTH was negative for a large hypodensity concerning for a large territory infarct or hyperdensity concerning for an ICH.  Results for MYANEXI, DOUGAL(MRN [id number] as of 11/19/2020 11:28  Ref. Range 12/04/2020 17:02  Appearance, CSF Latest Ref Range: CLEAR  HAZY (A)  Glucose, CSF Latest Ref Range: 40 - 70 mg/dL 75 (H)  RBC Count, CSF Latest Ref Range: 0 /cu mm 7 (H)  WBC, CSF Latest Ref Range: 0 - 5 /cu mm 206 (HH)  Segmented Neutrophils-CSF Latest Ref Range: 0 - 6 % 91 (H)  Lymphs, CSF Latest Ref Range: 40 - 80 % 0 (L)  Monocyte-Macrophage-Spinal Fluid Latest Ref Range: 15 - 45 % 9 (L)  Eosinophils, CSF Latest Ref Range: 0 - 1 % 0  Color, CSF Latest Ref Range: COLORLESS  COLORLESS  Supernatant Unknown NOT INDICATED  Total  Protein,  CSF Latest Ref Range: 15 - 45 mg/dL 94 (H)  Tube # Unknown 3     Impression   Margaret Rangel is a 75 y.o. female with PMH significant for prior stroke who presented via EMS with unresponsiveness and seizures. Had back to back seizures with no return to baseline in between and concern for status epilepticus. cEEG with left PELDS and BIRDs. Febrile in the ED and so LP was done with neutrophil predominant pleocytosis and elevated protein and glucose. LP was obtained after a dose of Antibiotics was given.  Recommendations  - Keppra increased to '1500mg'$  BID - Continue Propofol 5mg/Kg/min for now. Will attempt to wean off tomorrow if no seizures. - Continue Vancomycin, Ceftriaxone, Ampicilllin and Acyclovir. - HSV 1/2 PCR pending, can discontinue Acyclovir if negative. - Will get MRSA swab, Strep Pneumo Urinary Ag. - PCCM team to discuss CSF results with IOD and get their input on these and assistane with de-escalation of Antibiotics. ______________________________________________________________________  This patient is critically ill and at significant risk of neurological worsening, death and care requires constant monitoring of vital signs, hemodynamics,respiratory and cardiac monitoring, neurological assessment, discussion with family, other specialists and medical decision making of high complexity. I spent 35 minutes of neurocritical care time  in the care of  this patient. This was time spent independent of any time provided by nurse practitioner or PA.  SDonnetta SimpersTri[facility] Pager Number [phone]/09/2020  11:34 AM   Thank you for the opportunity to take part in the care of this patient. If you have any further questions, please contact the neurology consultation attending.  Signed,  SFreelandvillePager Number [phone]

## 2020-11-19 NOTE — Progress Notes (Signed)
Peripherally Inserted Central Catheter Placement  The IV Nurse has discussed with the patient and/or persons authorized to consent for the patient, the purpose of this procedure and the potential benefits and risks involved with this procedure.  The benefits include less needle sticks, lab draws from the catheter, and the patient may be discharged home with the catheter. Risks include, but not limited to, infection, bleeding, blood clot (thrombus formation), and puncture of an artery; nerve damage and irregular heartbeat and possibility to perform a PICC exchange if needed/ordered by physician.  Alternatives to this procedure were also discussed.  Bard Power PICC patient education guide, fact sheet on infection prevention and patient information card has been provided to patient /or left at bedside.  Telephone consent obtained by Ander Purpura RN, all questions answered.    PICC Placement Documentation  PICC Triple Lumen 11/19/20 PICC Right Brachial 38 cm 1 cm (Active)  Indication for Insertion or Continuance of Line Vasoactive infusions;Limited venous access - need for IV therapy >5 days (PICC only);Prolonged intravenous therapies 11/19/20 1749  Exposed Catheter (cm) 1 cm 11/19/20 1749  Site Assessment Clean;Dry;Intact 11/19/20 1749  Lumen #1 Status Flushed;Saline locked;Blood return noted 11/19/20 1749  Lumen #2 Status Flushed;Saline locked;Blood return noted 11/19/20 1749  Lumen #3 Status Flushed;Saline locked;Blood return noted 11/19/20 1749  Dressing Type Transparent 11/19/20 1749  Dressing Status Clean;Dry;Intact 11/19/20 1749  Antimicrobial disc in place? Yes 11/19/20 1749  Safety Lock Not Applicable [id number]  Line Care Connections checked and tightened 11/19/20 1749  Line Adjustment (NICU/IV Team Only) No 11/19/20 1749  Dressing Intervention New dressing 11/19/20 1749  Dressing Change Due 11/26/20 11/19/20 1749       Rolena Infante 11/19/2020, 5:50 PM

## 2020-11-19 NOTE — Progress Notes (Signed)
NAME:  Margaret Rangel, MRN:  [id number], DOB:  1945/11/08, LOS: 1 ADMISSION DATE:  11/24/2020, CONSULTATION DATE:  8/5 REFERRING MD:  picekring, CHIEF COMPLAINT:  seizure    History of Present Illness:  75 year old female with history as per below.  Per family was in usual state of health until 8/4 when her daughter noted she reported more fatigue than usual.  Otherwise no pertinent history observed.Found by her daughter this morning on 8/5 lying on the floor unresponsive.  She was last seen normal the night before.  When her daughter found her, she was nonverbal, unresponsive, had eye deviation upward.  She initiated CPR at the instructions of 911, and called EMS.  On EMS arrival they witnessed at least 5 generalized seizures.  These lasted 10 to 20 seconds apiece with forced gaze deviation to the right and twitching right upper extremity.  She was treated by EMS with 5 mg of midazolam in route, she was given another 2 mg in the emergency room for ongoing seizure activity, loaded with IV Keppra, intubated for airway protection, and neurology was consulted.  Continuous EEG was initiated, additional pertinent findings within the emergency room: New leukocytosis, low-grade fever, some postintubation hypotension which responded to volume resuscitation.  Critical care asked to admit.  Significant Hospital Events: Including procedures, antibiotic start and stop dates in addition to other pertinent events   8/5 intubated for airway protection after being given several doses of benzodiazapines and loaded w/ keppra. IV vanc,ceftriaxone and acyclovir started. LP planned. LTM initiated. CT head w/ encephalomalacia and multiple prior infacts.  8/6: LP was done yesterday, showing high white count, and neutrophil predominate.  EEG showed continuous seizure  Interim History / Subjective:  This morning patient was noted to have continuous convulsive seizures, propofol was restarted back CSF showed WBCs 208 Spiked  fever with T-max 101.3  Objective   Blood pressure (!) 109/47, pulse 98, temperature (!) 100.58 F (38.1 C), resp. rate 18, height '5\' 3"'$  (1.6 m), weight 65.2 kg, SpO2 100 %.    Vent Mode: PRVC FiO2 (%):  [40 %-100 %] 40 % Set Rate:  [18 bmp] 18 bmp Vt Set:  [420 mL] 420 mL PEEP:  [5 cmH20] 5 cmH20 Plateau Pressure:  [15 cmH20-22 cmH20] 18 cmH20   Intake/Output Summary (Last 24 hours) at 11/19/2020 1032 Last data filed at 11/19/2020 1000 Gross per 24 hour  Intake 4045.13 ml  Output 1135 ml  Net 2910.13 ml   Filed Weights   12/12/2020 1000 11/19/20 0500  Weight: 65 kg 65.2 kg    Examination: General: Critically ill looking elderly African-American female, lying on the bed, orally intubated HENT: Atraumatic, EEG leads are applied, pupils equal reactive sclera nonicteric orally intubated Lungs: Coarse scattered rhonchi throughout currently on full ventilator support Cardiovascular: Regular rate and rhythm systolic murmur audible Abdomen: Soft not tender Extremities: Warm dry Neuro: Sedated on propofol, eyes closed, not following commands  Resolved Hospital Problem list     Assessment & Plan:  Convulsive status epilepticus could 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 stroke versus acute meningitis/encephalitis Patient presented after found unresponsive with active seizures This morning patient was noted to have clinical seizures, propofol was restarted back on after giving 4 mg of IV Ativan Continue Keppra Continue LTM  Seizure precautions MRI brain with and without contrast is pending  Sepsis due to probable acute meningitis/encephalitis LP was done yesterday, showed white count 208, segmented neutrophil predominant with 9 monocyte She is on ampicillin, ceftriaxone, vancomycin and  acyclovir Gram stain of CSF is negative Follow-up cultures and HSV PCR  Acute toxic/infectious encephalopathy.  Likely multifactorial, partially status, now postictal, sedating medications and also could be due to  infection Patient is requiring deep sedation to control seizures  Acute hypoxic respiratory failure with ineffective airway protection.   Initial chest x-ray clear but very concerned about possible aspiration event especially in light of history of esophageal strictures Continue lung protective ventilation VAP bundle Respiratory culture pending   AKI, likely exacerbated by post intubation hypotension.  She is at risk for rhabdo given elevated CKs and prolonged seizure Continue IV hydration Strict intake output  Hypokalemia Continue aggressive electrolyte supplement and monitor  Diabetes type II Sliding scale insulin BG goal 140-180 Best Practice (right click and "Reselect all SmartList Selections" daily)   Diet/type: NPO w/ oral meds DVT prophylaxis: SCD GI prophylaxis: PPI Lines: N/A Foley:  Yes, and it is still needed Code Status:  full code Last date of multidisciplinary goals of care discussion: 8/6 patient's daughter was updated at bedside.  Continue full aggressive care  Labs   CBC: Recent Labs  Lab 11/24/2020 1034 12/04/2020 1039 12/07/2020 1132 11/19/20 0459 11/19/20 0505  WBC 17.4*  --   --   --  14.7*  NEUTROABS 13.6*  --   --   --   --   HGB 12.3 13.3  13.6 12.2 10.2* 10.7*  HCT 40.3 39.0  40.0 36.0 30.0* 32.9*  MCV 81.7  --   --   --  79.7*  PLT 340  --   --   --  [id number]    Basic Metabolic Panel: Recent Labs  Lab 11/21/2020 1034 12/13/2020 1039 11/27/2020 1132 12/05/2020 1829 11/19/20 0459 11/19/20 0505  NA 142 143  143 144 144 144 143  K 3.2* 3.0*  3.0* 2.6* 3.2* 3.7 4.1  CL 105 109  --  107  --  109  CO2 19*  --   --  22  --  21*  GLUCOSE 105* 109*  --  97  --  140*  BUN 24* 24*  --  25*  --  21  CREATININE 1.53* 1.30*  --  1.33*  --  1.14*  CALCIUM 9.3  --   --  8.6*  --  8.4*  MG  --   --   --   --   --  2.0  PHOS  --   --   --   --   --  2.3*   GFR: Estimated Creatinine Clearance: 38.1 mL/min (A) (by C-G formula based on SCr of 1.14 mg/dL  (H)). Recent Labs  Lab 11/25/2020 1034 12/14/2020 1148 12/14/2020 1234 11/19/2020 1250 11/19/20 0505  PROCALCITON  --   --   --  3.04  --   WBC 17.4*  --   --   --  14.7*  LATICACIDVEN  --  3.9* 1.3  --   --     Liver Function Tests: Recent Labs  Lab 11/15/2020 1034  AST 97*  ALT 31  ALKPHOS 45  BILITOT 1.1  PROT 7.1  ALBUMIN 3.9   No results for input(s): LIPASE, AMYLASE in the last 168 hours. No results for input(s): AMMONIA in the last 168 hours.  ABG    Component Value Date/Time   PHART 7.414 11/19/2020 0459   PCO2ART 32.1 11/19/2020 0459   PO2ART 79 (L) 11/19/2020 0459   HCO3 20.4 11/19/2020 0459   TCO2 21 (L) 11/19/2020 0459   ACIDBASEDEF  3.0 (H) 11/19/2020 0459   O2SAT 95.0 11/19/2020 0459     Coagulation Profile: No results for input(s): INR, PROTIME in the last 168 hours.  Cardiac Enzymes: Recent Labs  Lab 12/11/2020 1034  CKTOTAL 4,572*    HbA1C: HbA1c, POC (controlled diabetic range)  Date/Time Value Ref Range Status  08/17/2020 02:25 PM 6.4 0.0 - 7.0 % Final  12/30/2019 02:18 PM 6.0 0.0 - 7.0 % Final   Hgb A1c MFr Bld  Date/Time Value Ref Range Status  12/07/2020 10:34 AM 7.1 (H) 4.8 - 5.6 % Final    Comment:    (NOTE) Pre diabetes:          5.7%-6.4%  Diabetes:              >6.4%  Glycemic control for   <7.0% adults with diabetes     CBG: Recent Labs  Lab 11/14/2020 1044 12/08/2020 1618 11/26/2020 2054 11/19/20 0400 11/19/20 0756  GLUCAP 114* 137* 113* 115* 138*    Total critical care time: 47 minutes  Performed by: [place] care time was exclusive of separately billable procedures and treating other patients.   Critical care was necessary to treat or prevent imminent or life-threatening deterioration.   Critical care was time spent personally by me on the following activities: development of treatment plan with patient and/or surrogate as well as nursing, discussions with consultants, evaluation of patient's response  to treatment, examination of patient, obtaining history from patient or surrogate, ordering and performing treatments and interventions, ordering and review of laboratory studies, ordering and review of radiographic studies, pulse oximetry and re-evaluation of patient's condition.   Jacky Kindle MD [facility] See Amion for pager If no response to pager, please call [phone] until 7pm After 7pm, Please call E-link [phone]

## 2020-11-20 DIAGNOSIS — G9341 Metabolic encephalopathy: Secondary | ICD-10-CM

## 2020-11-20 DIAGNOSIS — J9601 Acute respiratory failure with hypoxia: Secondary | ICD-10-CM | POA: Diagnosis not present

## 2020-11-20 DIAGNOSIS — G40901 Epilepsy, unspecified, not intractable, with status epilepticus: Secondary | ICD-10-CM | POA: Diagnosis not present

## 2020-11-20 LAB — GLUCOSE, CAPILLARY
Glucose-Capillary: 130 mg/dL — ABNORMAL HIGH (ref 70–99)
Glucose-Capillary: 131 mg/dL — ABNORMAL HIGH (ref 70–99)
Glucose-Capillary: 121 mg/dL — ABNORMAL HIGH (ref 70–99)
Glucose-Capillary: 97 mg/dL (ref 70–99)
Glucose-Capillary: 130 mg/dL — ABNORMAL HIGH (ref 70–99)
Glucose-Capillary: 93 mg/dL (ref 70–99)

## 2020-11-20 LAB — BASIC METABOLIC PANEL
Anion gap: 9 (ref 5–15)
Anion gap: 9 (ref 5–15)
BUN: 13 mg/dL (ref 8–23)
BUN: 11 mg/dL (ref 8–23)
CO2: 23 mmol/L (ref 22–32)
CO2: 23 mmol/L (ref 22–32)
Calcium: 7.9 mg/dL — ABNORMAL LOW (ref 8.9–10.3)
Calcium: 7.9 mg/dL — ABNORMAL LOW (ref 8.9–10.3)
Chloride: 110 mmol/L (ref 98–111)
Chloride: 110 mmol/L (ref 98–111)
Creatinine, Ser: 1.01 mg/dL — ABNORMAL HIGH (ref 0.44–1.00)
Creatinine, Ser: 0.9 mg/dL (ref 0.44–1.00)
GFR, Estimated: 58 mL/min — ABNORMAL LOW (ref >60)
GFR, Estimated: >60 mL/min (ref >60)
Glucose, Bld: 156 mg/dL — ABNORMAL HIGH (ref 70–99)
Glucose, Bld: 113 mg/dL — ABNORMAL HIGH (ref 70–99)
Potassium: 3.4 mmol/L — ABNORMAL LOW (ref 3.5–5.1)
Potassium: 2.8 mmol/L — ABNORMAL LOW (ref 3.5–5.1)
Sodium: 142 mmol/L (ref 135–145)
Sodium: 142 mmol/L (ref 135–145)

## 2020-11-20 LAB — VALPROIC ACID LEVEL: Valproic Acid Lvl: 65 ug/mL (ref 50.0–100.0)

## 2020-11-20 LAB — MAGNESIUM: Magnesium: 2 mg/dL (ref 1.7–2.4)

## 2020-11-20 MED ORDER — ENOXAPARIN SODIUM 40 MG/0.4ML IJ SOSY
40.0000 mg | PREFILLED_SYRINGE | Freq: Every day | INTRAMUSCULAR | Status: DC
  Administered 2020-11-20 – 2020-11-29 (×10): 40 mg via SUBCUTANEOUS
  Filled 2020-11-20 (×10): qty 0.4

## 2020-11-20 MED ORDER — POTASSIUM CHLORIDE 10 MEQ/50ML IV SOLN
10.0000 meq | INTRAVENOUS | Status: AC
  Administered 2020-11-20 (×4): 10 meq via INTRAVENOUS
  Filled 2020-11-20 (×4): qty 50

## 2020-11-20 MED ORDER — POTASSIUM CHLORIDE 20 MEQ PO PACK
20.0000 meq | PACK | Freq: Once | ORAL | Status: AC
  Administered 2020-11-20: 20 meq
  Filled 2020-11-20: qty 1

## 2020-11-20 NOTE — Progress Notes (Signed)
No uop since foley removal. Bladder scan x3 and all are around 100 cc. Will re-scan in a few hours.

## 2020-11-20 NOTE — Progress Notes (Signed)
NEUROLOGY CONSULTATION PROGRESS NOTE   Date of service: November 20, 2020 Patient Name: Margaret Rangel MRN:  [id number] DOB:  [birth year]  Brief HPI   Margaret Rangel is a 75 y.o. female with PMH significant for prior stroke who presented via EMS with unresponsiveness and seizures. Had back to back seizures with no return to baseline in between and concern for status epilepticus.  cEEG with left PLEDS and BIRDs. On Keppra, Propofol.   Interval Hx   VPA added yesterday. Propofol uptitrated to 72mg/Kg/min overnight.  Vitals   Vitals:   11/20/20 1000 11/20/20 1100 11/20/20 1112 11/20/20 1200  BP: (!) 125/45 (!) 123/38  (!) 127/48  Pulse: (!) 57 (!) 55  (!) 54  Resp: '18 18  18  '$ Temp:    (!) 96.1 F (35.6 C)  TempSrc:    Axillary  SpO2: 100% 100% 100% 100%  Weight:      Height:         Body mass index is 25.7 kg/m.  Physical Exam   General: Laying in bed no clinical seizures today. HENT: Normal oropharynx and mucosa. Normal external appearance of ears and nose. Neck: Supple, no pain or tenderness CV: No JVD. No peripheral edema. Pulmonary: poor respiratory status. Ext: No cyanosis, edema, or deformity Skin: No rash. Normal palpation of skin. Musculoskeletal: Normal digits and nails by inspection. No clubbing.   Neurologic Examination  Mental status/Cognition: No response to voice or to noxious stimuli.  No response to nares stimulation with a Q-tip.  Brainstem reflexes: Corneals: Intact bilaterally pupils: 337mbilaterally and sluggish reactive to light. Gag: absent. Cough: absent.    Motor:  Muscle bulk: poor, tone flaccid throughout. No spontaneous movement, no response to pain in any extremities.    Coordination/Complex Motor:  Unable to assess.  Labs   Basic Metabolic Panel:  Lab Results  Component Value Date   NA 142 11/20/2020   K 3.4 (L) 11/20/2020   CO2 23 11/20/2020   GLUCOSE 156 (H) 11/20/2020   BUN 13 11/20/2020   CREATININE 1.01 (H)  11/20/2020   CALCIUM 7.9 (L) 11/20/2020   GFRNONAA 58 (L) 11/20/2020   GFRAA 62 08/11/2019   HbA1c:  Lab Results  Component Value Date   HGBA1C 7.1 (H) 12/10/2020   LDL:  Lab Results  Component Value Date   LDLCALC 54 08/11/2019   Urine Drug Screen:     Component Value Date/Time   LABOPIA NONE DETECTED 09/02/2018 1211   COCAINSCRNUR NONE DETECTED 09/02/2018 1211   COCAINSCRNUR NEG 04/11/2010 2024   LABBENZ NONE DETECTED 09/02/2018 1211   LABBENZ NEG 04/11/2010 2024   AMPHETMU NONE DETECTED 09/02/2018 1211   THCU POSITIVE (A) 09/02/2018 1211   LABBARB NONE DETECTED 09/02/2018 1211    Alcohol Level     Component Value Date/Time   ETH <10 09/02/2018 0923   No results found for: PHENYTOIN, ZONISAMIDE, LAMOTRIGINE, LEVETIRACETA Lab Results  Component Value Date   VALPROATE 65 11/20/2020    Imaging and Diagnostic studies   CT Head without contrast: Personally reviewed and CTH was negative for a large hypodensity concerning for a large territory infarct or hyperdensity concerning for an ICH.  Results for MAJULIONA, BRIGHTMANMRN [id number] of 11/19/2020 11:28  Ref. Range 11/16/2020 17:02  Appearance, CSF Latest Ref Range: CLEAR  HAZY (A)  Glucose, CSF Latest Ref Range: 40 - 70 mg/dL 75 (H)  RBC Count, CSF Latest Ref Range: 0 /cu mm 7 (H)  WBC, CSF Latest  Ref Range: 0 - 5 /cu mm 206 (HH)  Segmented Neutrophils-CSF Latest Ref Range: 0 - 6 % 91 (H)  Lymphs, CSF Latest Ref Range: 40 - 80 % 0 (L)  Monocyte-Macrophage-Spinal Fluid Latest Ref Range: 15 - 45 % 9 (L)  Eosinophils, CSF Latest Ref Range: 0 - 1 % 0  Color, CSF Latest Ref Range: COLORLESS  COLORLESS  Supernatant Unknown NOT INDICATED  Total  Protein, CSF Latest Ref Range: 15 - 45 mg/dL 94 (H)  Tube # Unknown 3     Impression   Margaret Rangel is a 75 y.o. female with PMH significant for prior stroke who presented via EMS with unresponsiveness and seizures. Had back to back seizures with no return to baseline  in between and concern for status epilepticus. cEEG with left PELDS and BIRDs. Febrile in the ED and so LP was done with neutrophil predominant pleocytosis and elevated protein and glucose. LP was obtained after a dose of Antibiotics was given.  Recommendations  - Keppra '1500mg'$  BID - VPA '250mg'$  Q6H. - Continue Propofol 41mg/Kg/min. If no seizure overnight, will attempt to wean tomorrow. - Continue Ceftriaxone, Ampicilllin and Acyclovir. - HSV 1/2 PCR pending, can discontinue Acyclovir if negative. - PCCM team to discuss CSF results with IOD and get their input on these and assistance with de-escalation of Antibiotics. ______________________________________________________________________  This patient is critically ill and at significant risk of neurological worsening, death and care requires constant monitoring of vital signs, hemodynamics,respiratory and cardiac monitoring, neurological assessment, discussion with family, other specialists and medical decision making of high complexity. I spent 35 minutes of neurocritical care time  in the care of  this patient. This was time spent independent of any time provided by nurse practitioner or PA.  SDonnetta SimpersTri[facility] Pager Number [phone]/10/2020  12:45 PM   Thank you for the opportunity to take part in the care of this patient. If you have any further questions, please contact the neurology consultation attending.  Signed,  SAshleyPager Number [phone]

## 2020-11-20 NOTE — Progress Notes (Addendum)
[facility] Progress Note Patient Name: ICELYN EICHE DOB: Oct 14, 1945 MRN: [id number]   Date of Service  11/20/2020  HPI/Events of Note  Notified of BMP with K of 2.8, creatinine 0.9.   eICU Interventions  40 meq IV kcl and 20 meq oral Kcl ordered. Repeat K ordered for 1 am. Also check a Mag now - add on to sample. RN to call with results      Intervention Category Major Interventions: Electrolyte abnormality - evaluation and management  Hazle Ogburn G Cejay Cambre 11/20/2020, 7:47 PM  10:40 pm - Retrospective note entry - Mag 2.0 so continue as above Call with 1 am K

## 2020-11-20 NOTE — Progress Notes (Signed)
EEG maintenance complete. No skin breakdown at [place] FP2 F4 P3. Continue to monitor

## 2020-11-20 NOTE — Procedures (Addendum)
Patient Name: Margaret Rangel  MRN: [id number]  Epilepsy Attending: Lora Havens  Referring Physician/Provider: Dr Donnetta Simpers Duration: 11/19/2020 1120 to 11/20/2020 1120   Patient history:  75 y.o. female with PMH significant for prior stroke who presented via EMS with unresponsiveness and seizures. Had back to back seizures with no return to baseline in between. EEG due to concern for status epilepticus.    Level of alertness:  comatose   AEDs during EEG study: LEV, VPA, propofol   Technical aspects: This EEG study was done with scalp electrodes positioned according to the 10-20 International system of electrode placement. Electrical activity was acquired at a sampling rate of '500Hz'$  and reviewed with a high frequency filter of '70Hz'$  and a low frequency filter of '1Hz'$ . EEG data were recorded continuously and digitally stored.   Description: EEG showed near continuous generalized and lateralized left hemisphere 3 to 6 Hz theta-delta slowing as well as brief periods of generalized eeg attenuation lasting 1-3 seconds. Abundant spikes were also in left hemisphere.   On 11/20/2020 after around 1700, per RN patient had periods of stiffness in arms and legs every 1-2 minutes without external stimulation. Concomitant EEG didn't show any EEG change to suggest seizure. Propofol was subsequently increased after which EEG showed burst attenuation with asynchronous bursts of generalized 3-'5hz'$  theta-delta slowing lasting 1-3 seconds and eeg attenuation lasting 8-10 seconds. Intermittent spikes were seen in left hemisphere and right frontal region.   ABNORMALITY - Burst attenuation, generalized - Spikes, left hemisphere - Spike, right frontal region   IMPRESSION: This study showed evidence of epileptogenicity arising from left hemisphere and right frontal region. Additionally, there is profound diffuse encephalopathy, non specific etiology but likely due to seizure and sedation.     [place] [name]

## 2020-11-20 NOTE — Progress Notes (Signed)
Still no uop. Patient bladder scanned and got 350 cc. I went ahead and did an I&O catheter. 500 cc urine out. Buck Mam RN and Ave Filter RN assisted.

## 2020-11-20 NOTE — Progress Notes (Signed)
NAME:  Margaret Rangel, MRN:  [id number], DOB:  04/06/1946, LOS: 2 ADMISSION DATE:  12/08/2020, CONSULTATION DATE:  8/5 REFERRING MD:  picekring, CHIEF COMPLAINT:  seizure    History of Present Illness:  75 year old female with history as per below.  Per family was in usual state of health until 8/4 when her daughter noted she reported more fatigue than usual.  Otherwise no pertinent history observed.Found by her daughter this morning on 8/5 lying on the floor unresponsive.  She was last seen normal the night before.  When her daughter found her, she was nonverbal, unresponsive, had eye deviation upward.  She initiated CPR at the instructions of 911, and called EMS.  On EMS arrival they witnessed at least 5 generalized seizures.  These lasted 10 to 20 seconds apiece with forced gaze deviation to the right and twitching right upper extremity.  She was treated by EMS with 5 mg of midazolam in route, she was given another 2 mg in the emergency room for ongoing seizure activity, loaded with IV Keppra, intubated for airway protection, and neurology was consulted.  Continuous EEG was initiated, additional pertinent findings within the emergency room: New leukocytosis, low-grade fever, some postintubation hypotension which responded to volume resuscitation.  Critical care asked to admit.  Significant Hospital Events: Including procedures, antibiotic start and stop dates in addition to other pertinent events   8/5 intubated for airway protection after being given several doses of benzodiazapines and loaded w/ keppra. IV vanc,ceftriaxone and acyclovir started. LP planned. LTM initiated. CT head w/ encephalomalacia and multiple prior infacts.  8/6: LP was done yesterday, showing high white count, and neutrophil predominate.  EEG showed continuous seizure 8/7: EEG showed continuous seizure, propofol was uptitrated last night  Interim History / Subjective:  Patient remained in convulsive status epilepticus,  propofol was uptitrated last night with improvement EEG confirmed status Her fever curve trended down  Objective   Blood pressure (!) 125/45, pulse (!) 57, temperature 97.7 F (36.5 C), temperature source Bladder, resp. rate 18, height '5\' 3"'$  (1.6 m), weight 65.8 kg, SpO2 100 %.    Vent Mode: PRVC FiO2 (%):  [40 %] 40 % Set Rate:  [18 bmp] 18 bmp Vt Set:  [420 mL] 420 mL PEEP:  [5 cmH20] 5 cmH20 Plateau Pressure:  [12 cmH20-16 cmH20] 16 cmH20   Intake/Output Summary (Last 24 hours) at 11/20/2020 1044 Last data filed at 11/20/2020 1000 Gross per 24 hour  Intake 4156.12 ml  Output 1840 ml  Net 2316.12 ml   Filed Weights   12/09/2020 1000 11/19/20 0500 11/20/20 0500  Weight: 65 kg 65.2 kg 65.8 kg    Examination: General: Critically ill looking elderly African-American female, lying on the bed, orally intubated HENT: Atraumatic, EEG leads are applied, pupils pinpoint, non-reactive, sclera nonicteric orally intubated Lungs: Coarse scattered rhonchi throughout currently on full ventilator support Cardiovascular: Regular rate and rhythm systolic murmur audible Abdomen: Soft not tender Extremities: Warm dry Neuro: Sedated on propofol, eyes closed, not following commands Skin: No rash  Resolved Hospital Problem list     Assessment & Plan:  Convulsive status epilepticus could 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 stroke versus acute meningitis/encephalitis Patient continued to have convulsive status until late last night She was given IV Ativan Propofol was uptitrated with improvement in pulmonary and EEG confirmed patient remained in status Continue Keppra Continue LTM  Seizure precautions MRI brain with and without contrast is pending Appreciate neurology follow-up and recommendations  Sepsis due to probable acute  meningitis/encephalitis and gram-positive pneumonia LP was done yesterday, showed white count 208, segmented neutrophil predominant with 9 monocyte She is on ampicillin, ceftriaxone  and acyclovir Gram stain of CSF is negative Follow-up cultures and HSV PCR MRSA screen came back negative Patient became afebrile Vancomycin was stopped Respiratory culture grew gram-positive cocci in pairs On IV antibiotics Follow-up sensitivity results  Acute toxic/infectious encephalopathy.  Likely multifactorial, partially status, now postictal, sedating medications and also could be due to infection Patient is requiring deep sedation to control seizures  Acute hypoxic respiratory failure due to pneumonia and seizures  Initial chest x-ray clear but very concerned about possible aspiration event especially in light of history of esophageal strictures Continue lung protective ventilation VAP bundle  AKI, likely exacerbated by post intubation hypotension.  She is at risk for rhabdo given elevated CKs and prolonged seizure Continue IV hydration Serum creatinine is improving Strict intake output  Hypokalemia Continue aggressive electrolyte supplement and monitor  Demand cardiac ischemia Patient serum troponins were elevated likely due to demand cardiac ischemia caused by sepsis and seizures Closely monitor  Diabetes type II Sliding scale insulin BG goal 140-180  Best Practice (right click and "Reselect all SmartList Selections" daily)   Diet/type: NPO w/ oral meds DVT prophylaxis: Enoxaparin GI prophylaxis: PPI Lines: PICC line, needed Foley:  Yes, and it is still needed Code Status:  full code Last date of multidisciplinary goals of care discussion: 8/7 patient's daughter was updated at bedside.  Continue full aggressive care  Labs   CBC: Recent Labs  Lab 12/08/2020 1034 12/03/2020 1039 12/14/2020 1132 11/19/20 0459 11/19/20 0505  WBC 17.4*  --   --   --  14.7*  NEUTROABS 13.6*  --   --   --   --   HGB 12.3 13.3  13.6 12.2 10.2* 10.7*  HCT 40.3 39.0  40.0 36.0 30.0* 32.9*  MCV 81.7  --   --   --  79.7*  PLT 340  --   --   --  [id number]    Basic Metabolic  Panel: Recent Labs  Lab 12/12/2020 1034 11/23/2020 1039 12/13/2020 1132 11/24/2020 1829 11/19/20 0459 11/19/20 0505 11/19/20 1736 11/20/20 0447  NA 142 143  143   < > 144 144 143 139 142  K 3.2* 3.0*  3.0*   < > 3.2* 3.7 4.1 3.4* 3.4*  CL 105 109  --  107  --  109 107 110  CO2 19*  --   --  22  --  21* 21* 23  GLUCOSE 105* 109*  --  97  --  140* 118* 156*  BUN 24* 24*  --  25*  --  '21 13 13  '$ CREATININE 1.53* 1.30*  --  1.33*  --  1.14* 0.99 1.01*  CALCIUM 9.3  --   --  8.6*  --  8.4* 7.7* 7.9*  MG  --   --   --   --   --  2.0  --   --   PHOS  --   --   --   --   --  2.3*  --   --    < > = values in this interval not displayed.   GFR: Estimated Creatinine Clearance: 43.2 mL/min (A) (by C-G formula based on SCr of 1.01 mg/dL (H)). Recent Labs  Lab 12/13/2020 1034 11/27/2020 1148 11/15/2020 1234 11/24/2020 1250 11/19/20 0505  PROCALCITON  --   --   --  3.04  --  WBC 17.4*  --   --   --  14.7*  LATICACIDVEN  --  3.9* 1.3  --   --     Liver Function Tests: Recent Labs  Lab 11/15/2020 1034  AST 97*  ALT 31  ALKPHOS 45  BILITOT 1.1  PROT 7.1  ALBUMIN 3.9   No results for input(s): LIPASE, AMYLASE in the last 168 hours. No results for input(s): AMMONIA in the last 168 hours.  ABG    Component Value Date/Time   PHART 7.414 11/19/2020 0459   PCO2ART 32.1 11/19/2020 0459   PO2ART 79 (L) 11/19/2020 0459   HCO3 20.4 11/19/2020 0459   TCO2 21 (L) 11/19/2020 0459   ACIDBASEDEF 3.0 (H) 11/19/2020 0459   O2SAT 95.0 11/19/2020 0459     Coagulation Profile: No results for input(s): INR, PROTIME in the last 168 hours.  Cardiac Enzymes: Recent Labs  Lab 12/11/2020 1034  CKTOTAL 4,572*    HbA1C: HbA1c, POC (controlled diabetic range)  Date/Time Value Ref Range Status  08/17/2020 02:25 PM 6.4 0.0 - 7.0 % Final  12/30/2019 02:18 PM 6.0 0.0 - 7.0 % Final   Hgb A1c MFr Bld  Date/Time Value Ref Range Status  11/21/2020 10:34 AM 7.1 (H) 4.8 - 5.6 % Final    Comment:     (NOTE) Pre diabetes:          5.7%-6.4%  Diabetes:              >6.4%  Glycemic control for   <7.0% adults with diabetes     CBG: Recent Labs  Lab 11/19/20 1541 11/19/20 1951 11/19/20 2320 11/20/20 0326 11/20/20 0742  GLUCAP 109* 108* 136* 130* 131*    Total critical care time: 45 minutes  Performed by: Arbuckle care time was exclusive of separately billable procedures and treating other patients.   Critical care was necessary to treat or prevent imminent or life-threatening deterioration.   Critical care was time spent personally by me on the following activities: development of treatment plan with patient and/or surrogate as well as nursing, discussions with consultants, evaluation of patient's response to treatment, examination of patient, obtaining history from patient or surrogate, ordering and performing treatments and interventions, ordering and review of laboratory studies, ordering and review of radiographic studies, pulse oximetry and re-evaluation of patient's condition.   Jacky Kindle MD [facility] See Amion for pager If no response to pager, please call [phone] until 7pm After 7pm, Please call E-link [phone]

## 2020-11-20 NOTE — Procedures (Addendum)
Patient Name: Margaret Rangel  MRN: [id number]  Epilepsy Attending: Lora Havens  Referring Physician/Provider: Dr Donnetta Simpers Duration: 11/20/2020 1120 to 11/21/2020 1120   Patient history:  75 y.o. female with PMH significant for prior stroke who presented via EMS with unresponsiveness and seizures. Had back to back seizures with no return to baseline in between. EEG due to concern for status epilepticus.    Level of alertness:  comatose   AEDs during EEG study: LEV, VPA, propofol   Technical aspects: This EEG study was done with scalp electrodes positioned according to the 10-20 International system of electrode placement. Electrical activity was acquired at a sampling rate of '500Hz'$  and reviewed with a high frequency filter of '70Hz'$  and a low frequency filter of '1Hz'$ . EEG data were recorded continuously and digitally stored.   Description: EEG initially showed burst attenuation with asynchronous bursts of sharply contoured generalized 3-'5hz'$  theta-delta slowing admixed with generalized 15-'18hz'$  generalized beta activity lasting 1-3 seconds and eeg attenuation lasting 2-4seconds. Intermittent spikes were seen in left hemisphere and right frontal region. Gradually, the bursts appeared more frequent and morphology appeared highly epileptiform.    ABNORMALITY - Burst attenuation with highly epileptiform discharges, generalized - Spikes, left hemisphere - Spike, right frontal region   IMPRESSION: This study showed evidence of epileptogenicity arising from left hemisphere and right frontal region. Additionally, there is profound diffuse encephalopathy, non specific etiology but likely due to seizure and sedation.     [name] [name]

## 2020-11-20 NOTE — Progress Notes (Signed)
Spoke to patient's daughter and gave her updates over the phone at this time.

## 2020-11-20 NOTE — Progress Notes (Signed)
No longer an indication for foley catheter. I removed it and replaced it with a purewick at this time.

## 2020-11-21 ENCOUNTER — Inpatient Hospital Stay (HOSPITAL_COMMUNITY): Payer: Medicare Other

## 2020-11-21 DIAGNOSIS — E876 Hypokalemia: Secondary | ICD-10-CM | POA: Diagnosis not present

## 2020-11-21 DIAGNOSIS — I639 Cerebral infarction, unspecified: Secondary | ICD-10-CM | POA: Diagnosis not present

## 2020-11-21 DIAGNOSIS — A419 Sepsis, unspecified organism: Secondary | ICD-10-CM

## 2020-11-21 DIAGNOSIS — G40901 Epilepsy, unspecified, not intractable, with status epilepticus: Secondary | ICD-10-CM | POA: Diagnosis not present

## 2020-11-21 DIAGNOSIS — N179 Acute kidney failure, unspecified: Secondary | ICD-10-CM | POA: Diagnosis not present

## 2020-11-21 DIAGNOSIS — R6521 Severe sepsis with septic shock: Secondary | ICD-10-CM

## 2020-11-21 DIAGNOSIS — G0490 Encephalitis and encephalomyelitis, unspecified: Secondary | ICD-10-CM | POA: Diagnosis not present

## 2020-11-21 LAB — CULTURE, RESPIRATORY W GRAM STAIN: Culture: NORMAL

## 2020-11-21 LAB — CULTURE, BLOOD (ROUTINE X 2): Special Requests: ADEQUATE

## 2020-11-21 LAB — LIPID PANEL
Cholesterol: 84 mg/dL (ref 0–200)
HDL: 34 mg/dL — ABNORMAL LOW (ref >40)
LDL Cholesterol: 4 mg/dL (ref 0–99)
Total CHOL/HDL Ratio: 2.5 RATIO
Triglycerides: 231 mg/dL — ABNORMAL HIGH (ref <150)
VLDL: 46 mg/dL — ABNORMAL HIGH (ref 0–40)

## 2020-11-21 LAB — GLUCOSE, CAPILLARY
Glucose-Capillary: 109 mg/dL — ABNORMAL HIGH (ref 70–99)
Glucose-Capillary: 92 mg/dL (ref 70–99)
Glucose-Capillary: 140 mg/dL — ABNORMAL HIGH (ref 70–99)
Glucose-Capillary: 147 mg/dL — ABNORMAL HIGH (ref 70–99)
Glucose-Capillary: 110 mg/dL — ABNORMAL HIGH (ref 70–99)

## 2020-11-21 LAB — BASIC METABOLIC PANEL
Anion gap: 8 (ref 5–15)
BUN: 8 mg/dL (ref 8–23)
CO2: 23 mmol/L (ref 22–32)
Calcium: 7.9 mg/dL — ABNORMAL LOW (ref 8.9–10.3)
Chloride: 111 mmol/L (ref 98–111)
Creatinine, Ser: 0.92 mg/dL (ref 0.44–1.00)
GFR, Estimated: >60 mL/min (ref >60)
Glucose, Bld: 111 mg/dL — ABNORMAL HIGH (ref 70–99)
Potassium: 3.5 mmol/L (ref 3.5–5.1)
Sodium: 142 mmol/L (ref 135–145)

## 2020-11-21 LAB — HSV 1/2 PCR, CSF
HSV-1 DNA: NEGATIVE
HSV-2 DNA: NEGATIVE

## 2020-11-21 LAB — POTASSIUM: Potassium: 3.8 mmol/L (ref 3.5–5.1)

## 2020-11-21 LAB — VZV PCR, CSF: VZV PCR, CSF: NEGATIVE

## 2020-11-21 MED ORDER — VITAL HIGH PROTEIN PO LIQD
1000.0000 mL | ORAL | Status: DC
  Administered 2020-11-21: 1000 mL

## 2020-11-21 MED ORDER — ASPIRIN 81 MG PO CHEW
81.0000 mg | CHEWABLE_TABLET | Freq: Every day | ORAL | Status: DC
  Administered 2020-11-21 – 2020-11-29 (×9): 81 mg
  Filled 2020-11-21 (×9): qty 1

## 2020-11-21 MED ORDER — ASPIRIN 81 MG PO CHEW: 81.0000 mg | CHEWABLE_TABLET | Freq: Every day | ORAL | Status: DC

## 2020-11-21 MED ORDER — ATORVASTATIN CALCIUM 80 MG PO TABS: 80.0000 mg | ORAL_TABLET | Freq: Every day | ORAL | Status: DC

## 2020-11-21 MED ORDER — PHENYTOIN SODIUM 50 MG/ML IJ SOLN
100.0000 mg | Freq: Three times a day (TID) | INTRAMUSCULAR | Status: DC
  Administered 2020-11-21 – 2020-11-29 (×25): 100 mg via INTRAVENOUS
  Filled 2020-11-21 (×26): qty 2

## 2020-11-21 MED ORDER — POTASSIUM CHLORIDE 10 MEQ/50ML IV SOLN
10.0000 meq | INTRAVENOUS | Status: AC
Start: 2020-11-21 — End: 2020-11-21
  Administered 2020-11-21 (×4): 10 meq via INTRAVENOUS
  Filled 2020-11-21 (×4): qty 50

## 2020-11-21 MED ORDER — GADOBUTROL 1 MMOL/ML IV SOLN
6.5000 mL | Freq: Once | INTRAVENOUS | Status: AC | PRN
  Administered 2020-11-21: 6.5 mL via INTRAVENOUS

## 2020-11-21 MED ORDER — SODIUM CHLORIDE 0.9 % IV SOLN
20.0000 mg/kg | Freq: Once | INTRAVENOUS | Status: AC
  Administered 2020-11-21: 1316 mg via INTRAVENOUS
  Filled 2020-11-21: qty 26.32

## 2020-11-21 MED ORDER — GADOBUTROL 1 MMOL/ML IV SOLN: 6.5000 mL | Freq: Once | INTRAVENOUS | Status: DC | PRN

## 2020-11-21 MED ORDER — ATORVASTATIN CALCIUM 80 MG PO TABS
80.0000 mg | ORAL_TABLET | Freq: Every day | ORAL | Status: DC
  Administered 2020-11-21 – 2020-11-29 (×9): 80 mg
  Filled 2020-11-21 (×9): qty 1

## 2020-11-21 MED ORDER — VALPROATE SODIUM 100 MG/ML IV SOLN
350.0000 mg | Freq: Four times a day (QID) | INTRAVENOUS | Status: DC
  Administered 2020-11-21 – 2020-11-29 (×33): 350 mg via INTRAVENOUS
  Filled 2020-11-21 (×37): qty 3.5

## 2020-11-21 NOTE — Consult Note (Signed)
[facility]    Date of Admission:  12/11/2020     Reason for Consult: sepsis, seizure, meningoencephalitis    Referring Provider: Jacky Kindle     Lines:  8/06-c rue picc 8/05-c foley 8/05-c ETT  Abx: 8/05-c acyclovir 8/05-c ceftriaxone 8/05-c ampicillin  8/05 vanc        Assessment: 75 y.o. 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 This suggest a viral process but some early bacterial process could be sources as well.   There are no other metastatic involvement outside of the cns   As of 8/08 her fever/sepsis had previously quickly resolved. She continues to have ongoing seizure as of 8/07. She is hypothermic today though in setting of beign on pressors  Agree with keeping anti-herpetic coverage and empiric bsAbx until cultures/hsv pcr finalizes  Will add routine HCM screen for syphilis and hiv  Plan: Add hiv/syphilis screen for tomorrow and also send for cryptococcal ag Continue acyclovir iv, ceftriaxone, and ampicillin for now Other supportive care measures per primary team     ------------------------------------------------ Active Problems:   Status epilepticus [facility])    HPI: Margaret Rangel is a 75 y.o. 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 Patient intubated  Patient was found by family lying on the floor unresponsive, nonverbal, and had upward eye deviation. EMS witnessed 5 episodes of gtc, given midazolam   On arrival to the ed, had ongoing seizure activities, given keppra, and intubated for airway protection. She had fever 100s on HD#1. Initial labs include wbc 17, neutrophilic predominant,  mild elevated cr 1.3, procalcitonin 3. Lft not checked  She was started on bsAbx A lumbar puncture was done on initial presentation --> 206 wbc, 91% seg, 75 glucose, 94 protein Bcx so far 1 of 2 set staph hominis, presumed contaminant HD#1 respiratory cx show gpc in pairs and gram variable rod (both rare in quantification)  Her fever had resolved quickly within 24 hours admission/abx. Wbc and azotemia improving. She remains intubated on minimal vent setting  The csf cx so far is ngtd Urine strep pna ag was negative Brain mri is showing left cerebral hemisphere acute/subacute stroke along with mild restricted diffusion changes left thalamus/hippocampus  As of 8/07, eeg showed continuous seizure. She has been started on kepra, phenytoin, valproate, and also is on propofol gtt   Pending id w/u: csf hsv, vzv pcr  Spoke with nurse, patient is getting warming blanket due to hypothermia today 8/08. She is on pressor  Spoke with her daughter --> 2 weeks ago patient complained of feeling like she is having a stroke. No rash, sick contact, headache, focal pain/numbness/tinling/weakness. No recent travel. No pets  Family History  Problem Relation Age of Onset   Prostate cancer Brother    Heart disease Mother    Anuerysm Father    Lung cancer Brother    Colon cancer Brother    Healthy Child    Rectal cancer Neg Hx    Stomach cancer Neg Hx    Esophageal cancer Neg Hx     Social History   Tobacco Use   Smoking status: Former    Packs/day: 0.25  Years: 39.00    Pack years: 9.75    Types: Cigarettes    Start date: 04/16/1976    Quit date: 02/13/2016    Years since quitting: 4.7   Smokeless tobacco: Never   Tobacco comments:    4-5 cigs per day for 39 years  Vaping Use   Vaping Use: Never used  Substance Use Topics   Alcohol use: Not Currently    Alcohol/week: 21.0 standard drinks    Types: 21 Cans of beer per week    Comment: quit 2 years ago   Drug use: Yes    Types:  Marijuana    Comment: using for depression// last used this morning 4am    Allergies  Allergen Reactions   Codeine Itching   Penicillins Itching    Has patient had a PCN reaction causing immediate rash, facial/tongue/throat swelling, SOB or lightheadedness with hypotension:No Has patient had a PCN reaction causing severe rash involving mucus membranes or skin necrosis:No Has patient had a PCN reaction that required hopititalization:No Has patient had a PCN reaction occurring within the last 10 years:No If all of the above answers are "NO", then may proceed with Cephalosporin use.     Review of Systems: ROS All Other ROS was negative, except mentioned above   Past Medical History:  Diagnosis Date   Abdominal pain    Anal fissure    Arthritis    Asthma    Carotid artery occlusion    Chronic back pain    Colon adenoma 2009, 2012   Colonoscopy   Diverticulosis of colon (without mention of hemorrhage) 2009   Colonoscopy   DM (diabetes mellitus) ([place])    off medicines for diabetes 12-18-13   GERD (gastroesophageal reflux disease)    Hemorrhoids    Hernia    HTN (hypertension)    Hyperlipidemia    low   Irritable bowel syndrome (IBS)    Low blood potassium    Obese    Stroke ([place])    x2   Ulcer        Scheduled Meds:  aspirin  81 mg Oral Daily   atorvastatin  80 mg Oral Daily   chlorhexidine gluconate (MEDLINE KIT)  15 mL Mouth Rinse BID   Chlorhexidine Gluconate Cloth  6 each Topical Daily   enoxaparin (LOVENOX) injection  40 mg Subcutaneous Daily   insulin aspart  0-9 Units Subcutaneous Q4H   mouth rinse  15 mL Mouth Rinse 10 times per day   pantoprazole (PROTONIX) IV  40 mg Intravenous QHS   phenytoin (DILANTIN) IV  100 mg Intravenous Q8H   sodium chloride flush  10-40 mL Intracatheter Q12H   Continuous Infusions:  acyclovir 113 mL/hr at 11/21/20 1500   ampicillin (OMNIPEN) IV Stopped (11/21/20 1012)   cefTRIAXone (ROCEPHIN)  IV Stopped (11/21/20 1430)    lactated ringers Stopped (11/21/20 1434)   levETIRAcetam Stopped (11/21/20 0921)   norepinephrine (LEVOPHED) Adult infusion 6 mcg/min (11/21/20 1544)   propofol (DIPRIVAN) infusion 70 mcg/kg/min (11/21/20 1542)   valproate sodium Stopped (11/21/20 1414)   PRN Meds:.acetaminophen (TYLENOL) oral liquid 160 mg/5 mL, docusate sodium, fentaNYL (SUBLIMAZE) injection, fentaNYL (SUBLIMAZE) injection, gadobutrol, polyethylene glycol, sodium chloride flush   OBJECTIVE: Blood pressure (!) 107/58, pulse 66, temperature 97.6 F (36.4 C), temperature source Axillary, resp. rate 17, height 5' 3" (1.6 m), weight 65.8 kg, SpO2 100 %.  Physical Exam  General/constitutional: intubated/sedated-comatose HEENT: Normocephalic, PER, Conj Clear but with edema Neck supple CV: rrr no mrg Lungs: clear  to auscultation, normal respiratory effort Abd: Soft, Nontender Ext: slight trace edema bilateral hands Skin: No Rash Neuro/psych: intubated/sedated-comatose MSK: no peripheral joint swelling/tenderness/warmth; back spines nontender   Central line presence: rue picc site no purulence/erythema   Lab Results Lab Results  Component Value Date   WBC 14.7 (H) 11/19/2020   HGB 10.7 (L) 11/19/2020   HCT 32.9 (L) 11/19/2020   MCV 79.7 (L) 11/19/2020   PLT 239 11/19/2020    Lab Results  Component Value Date   CREATININE 0.92 11/21/2020   BUN 8 11/21/2020   NA 142 11/21/2020   K 3.5 11/21/2020   CL 111 11/21/2020   CO2 23 11/21/2020    Lab Results  Component Value Date   ALT 31 11/19/2020   AST 97 (H) 12/12/2020   ALKPHOS 45 11/14/2020   BILITOT 1.1 12/04/2020      Microbiology: Recent Results (from the past 240 hour(s))  Resp Panel by RT-PCR (Flu A&B, Covid) Nasopharyngeal Swab     Status: None   Collection Time: 11/29/2020 10:39 AM   Specimen: Nasopharyngeal Swab; Nasopharyngeal(NP) swabs in vial transport medium  Result Value Ref Range Status   SARS Coronavirus 2 by RT PCR NEGATIVE NEGATIVE  Final    Comment: (NOTE) SARS-CoV-2 target nucleic acids are NOT DETECTED.  The SARS-CoV-2 RNA is generally detectable in upper respiratory specimens during the acute phase of infection. The lowest concentration of SARS-CoV-2 viral copies this assay can detect is 138 copies/mL. A negative result does not preclude SARS-Cov-2 infection and should not be used as the sole basis for treatment or other patient management decisions. A negative result may occur with  improper specimen collection/handling, submission of specimen other than nasopharyngeal swab, presence of viral mutation(s) within the areas targeted by this assay, and inadequate number of viral copies(<138 copies/mL). A negative result must be combined with clinical observations, patient history, and epidemiological information. The expected result is Negative.  Fact Sheet for Patients:  EntrepreneurPulse.com.au  Fact Sheet for Healthcare Providers:  IncredibleEmployment.be  This test is no t yet approved or cleared by the [place] FDA and  has been authorized for detection and/or diagnosis of SARS-CoV-2 by FDA under an Emergency Use Authorization (EUA). This EUA will remain  in effect (meaning this test can be used) for the duration of the COVID-19 declaration under Section 564(b)(1) of the Act, 21 U.S.C.section 360bbb-3(b)(1), unless the authorization is terminated  or revoked sooner.       Influenza A by PCR NEGATIVE NEGATIVE Final   Influenza B by PCR NEGATIVE NEGATIVE Final    Comment: (NOTE) The Xpert Xpress SARS-CoV-2/FLU/RSV plus assay is intended as an aid in the diagnosis of influenza from Nasopharyngeal swab specimens and should not be used as a sole basis for treatment. Nasal washings and aspirates are unacceptable for Xpert Xpress SARS-CoV-2/FLU/RSV testing.  Fact Sheet for Patients: EntrepreneurPulse.com.au  Fact Sheet for Healthcare  Providers: IncredibleEmployment.be  This test is not yet approved or cleared by the [place] FDA and has been authorized for detection and/or diagnosis of SARS-CoV-2 by FDA under an Emergency Use Authorization (EUA). This EUA will remain in effect (meaning this test can be used) for the duration of the COVID-19 declaration under Section 564(b)(1) of the Act, 21 U.S.C. section 360bbb-3(b)(1), unless the authorization is terminated or revoked.  Performed at [facility], [place] [address], [place], [place] [address]   Blood culture (routine x 2)     Status: Abnormal   Collection Time:  11/19/2020 12:34 PM   Specimen: BLOOD  Result Value Ref Range Status   Specimen Description BLOOD LEFT ANTECUBITAL  Final   Special Requests   Final    BOTTLES DRAWN AEROBIC AND ANAEROBIC Blood Culture adequate volume   Culture  Setup Time   Final    GRAM POSITIVE COCCI IN CLUSTERS AEROBIC BOTTLE ONLY CRITICAL RESULT CALLED TO, READ BACK BY AND VERIFIED WITH: V. BRYK,PHARMD 0701 11/19/2020 T. TYSOR    Culture (A)  Final    STAPHYLOCOCCUS HOMINIS THE SIGNIFICANCE OF ISOLATING THIS ORGANISM FROM A SINGLE SET OF BLOOD CULTURES WHEN MULTIPLE SETS ARE DRAWN IS UNCERTAIN. PLEASE NOTIFY THE MICROBIOLOGY DEPARTMENT WITHIN ONE WEEK IF SPECIATION AND SENSITIVITIES ARE REQUIRED. Performed at [facility], [place] [address] [place], [place] [address]    Report Status 11/21/2020 FINAL  Final  Blood Culture ID Panel (Reflexed)     Status: Abnormal   Collection Time: 11/20/2020 12:34 PM  Result Value Ref Range Status   Enterococcus faecalis NOT DETECTED NOT DETECTED Final   Enterococcus Faecium NOT DETECTED NOT DETECTED Final   Listeria monocytogenes NOT DETECTED NOT DETECTED Final   Staphylococcus species DETECTED (A) NOT DETECTED Final    Comment: CRITICAL RESULT CALLED TO, READ BACK BY AND VERIFIED WITH: V. BRYK,PHARMD 0701 11/19/2020 T. TYSOR    Staphylococcus aureus (BCID) NOT  DETECTED NOT DETECTED Final   Staphylococcus epidermidis NOT DETECTED NOT DETECTED Final   Staphylococcus lugdunensis NOT DETECTED NOT DETECTED Final   Streptococcus species NOT DETECTED NOT DETECTED Final   Streptococcus agalactiae NOT DETECTED NOT DETECTED Final   Streptococcus pneumoniae NOT DETECTED NOT DETECTED Final   Streptococcus pyogenes NOT DETECTED NOT DETECTED Final   A.calcoaceticus-baumannii NOT DETECTED NOT DETECTED Final   Bacteroides fragilis NOT DETECTED NOT DETECTED Final   Enterobacterales NOT DETECTED NOT DETECTED Final   Enterobacter cloacae complex NOT DETECTED NOT DETECTED Final   Escherichia coli NOT DETECTED NOT DETECTED Final   Klebsiella aerogenes NOT DETECTED NOT DETECTED Final   Klebsiella oxytoca NOT DETECTED NOT DETECTED Final   Klebsiella pneumoniae NOT DETECTED NOT DETECTED Final   Proteus species NOT DETECTED NOT DETECTED Final   Salmonella species NOT DETECTED NOT DETECTED Final   Serratia marcescens NOT DETECTED NOT DETECTED Final   Haemophilus influenzae NOT DETECTED NOT DETECTED Final   Neisseria meningitidis NOT DETECTED NOT DETECTED Final   Pseudomonas aeruginosa NOT DETECTED NOT DETECTED Final   Stenotrophomonas maltophilia NOT DETECTED NOT DETECTED Final   Candida albicans NOT DETECTED NOT DETECTED Final   Candida auris NOT DETECTED NOT DETECTED Final   Candida glabrata NOT DETECTED NOT DETECTED Final   Candida krusei NOT DETECTED NOT DETECTED Final   Candida parapsilosis NOT DETECTED NOT DETECTED Final   Candida tropicalis NOT DETECTED NOT DETECTED Final   Cryptococcus neoformans/gattii NOT DETECTED NOT DETECTED Final    Comment: Performed at [facility], [address] [place], [place] [address]  Urine Culture     Status: None   Collection Time: 12/02/2020  1:40 PM   Specimen: Urine, Catheterized  Result Value Ref Range Status   Specimen Description URINE, CATHETERIZED  Final   Special Requests NONE  Final   Culture   Final    NO  GROWTH Performed at [facility], [place] [address], [place], [place] [address]    Report Status 11/19/2020 FINAL  Final  CSF culture w Gram Stain     Status: None (Preliminary result)   Collection  Time: 12/11/2020  5:02 PM   Specimen: CSF  Result Value Ref Range Status   Specimen Description CSF  Final   Special Requests NONE  Final   Gram Stain   Final    WBC PRESENT,BOTH PMN AND MONONUCLEAR NO ORGANISMS SEEN CYTOSPIN SMEAR    Culture   Final    NO GROWTH 3 DAYS Performed at [facility], [place] [address] [place], [place] [address]    Report Status PENDING  Incomplete  Blood culture (routine x 2)     Status: None (Preliminary result)   Collection Time: 12/12/2020  6:23 PM   Specimen: BLOOD RIGHT HAND  Result Value Ref Range Status   Specimen Description BLOOD RIGHT HAND  Final   Special Requests   Final    AEROBIC BOTTLE ONLY Blood Culture results may not be optimal due to an inadequate volume of blood received in culture bottles   Culture   Final    NO GROWTH 2 DAYS Performed at [facility], [place] [address], [place], [place] [address]    Report Status PENDING  Incomplete  MRSA Next Gen by PCR, Nasal     Status: None   Collection Time: 11/19/20  9:24 AM   Specimen: Nasal Mucosa; Nasal Swab  Result Value Ref Range Status   MRSA by PCR Next Gen NOT DETECTED NOT DETECTED Final    Comment: (NOTE) The GeneXpert MRSA Assay (FDA approved for NASAL specimens only), is one component of a comprehensive MRSA colonization surveillance program. It is not intended to diagnose MRSA infection nor to guide or monitor treatment for MRSA infections. Test performance is not FDA approved in patients less than 17 years old. Performed at [facility], [place] [address], [place], [place] [address]   Culture, Respiratory w Gram Stain     Status: None   Collection Time: 11/19/20  9:27 AM   Specimen: Tracheal Aspirate; Respiratory  Result Value Ref Range Status   Specimen  Description TRACHEAL ASPIRATE  Final   Special Requests NONE  Final   Gram Stain   Final    ABUNDANT WBC PRESENT, PREDOMINANTLY PMN RARE GRAM POSITIVE COCCI IN PAIRS RARE GRAM VARIABLE ROD    Culture   Final    RARE Normal respiratory flora-no Staph aureus or Pseudomonas seen Performed at [facility], [place] [address] [place], [place] [address]    Report Status 11/21/2020 FINAL  Final     Serology:    Imaging: If present, new imagings (plain films, ct scans, and mri) have been personally visualized and interpreted; radiology reports have been reviewed. Decision making incorporated into the Impression / Recommendations.   8/05 cxr  No abnormality  8/8 mri brain 1. Multiple small foci of restricted diffusion scattered in the left cerebral hemisphere, in a watershed distribution, consistent with acute/subacute infarct. 2. Mildly restricted diffusion involving the left hippocampus and left thalamus is most likely related to seizure activity in the setting of refractory seizures. 3. Moderate chronic microvascular ischemic changes of the white matter. 4. Remote lacunar infarcts in the left frontal lobe, parietooccipital region, bilateral basal ganglia, thalami, pons and right cerebellar hemisphere.  Jabier Mutton, [place] [facility] [place] [phone] pager    11/21/2020, 3:57 PM

## 2020-11-21 NOTE — Progress Notes (Signed)
EEG leads removed for MRI. Will re hook EEG when pt returns. No skin breakdown was seen

## 2020-11-21 NOTE — Progress Notes (Signed)
Pharmacy Antibiotic Note  Margaret Rangel is a 75 y.o. female admitted on 11/17/2020 with meningitis.  Pharmacy has been consulted for vancomycin, ceftriaxone, and acyclovir dosing. Patient was found unresponsive on the floor and had at least 5 seizures.   LP done (after antibiotics) neutrophils present Vanc d/c'd  Plan: Continue Ceftriaxone 2 g IV q12h Continue Acyclovir 10 mg/kg (650 mg) IV q12h w/ LR 100 mL/hr Monitor renal function, clinical status, and abx plan.  Height: '5\' 3"'$  (160 cm) Weight: 65.8 kg (145 lb 1 oz) IBW/kg (Calculated) : 52.4  Temp (24hrs), Avg:96.8 F (36 C), Min:96.1 F (35.6 C), Max:97.7 F (36.5 C)  Recent Labs  Lab 11/28/2020 1034 11/16/2020 1039 11/26/2020 1148 12/08/2020 1234 11/22/2020 1829 11/19/20 0505 11/19/20 1736 11/20/20 0447 11/20/20 1615 11/21/20 0552  WBC 17.4*  --   --   --   --  14.7*  --   --   --   --   CREATININE 1.53*   < >  --   --    < > 1.14* 0.99 1.01* 0.90 0.92  LATICACIDVEN  --   --  3.9* 1.3  --   --   --   --   --   --    < > = values in this interval not displayed.     Estimated Creatinine Clearance: 47.5 mL/min (by C-G formula based on SCr of 0.92 mg/dL).    Allergies  Allergen Reactions   Codeine Itching   Penicillins Itching    Has patient had a PCN reaction causing immediate rash, facial/tongue/throat swelling, SOB or lightheadedness with hypotension:No Has patient had a PCN reaction causing severe rash involving mucus membranes or skin necrosis:No Has patient had a PCN reaction that required hopititalization:No Has patient had a PCN reaction occurring within the last 10 years:No If all of the above answers are "NO", then may proceed with Cephalosporin use.     Antimicrobials this admission: Vancomycin 8/5 >>  Ceftriaxone 8/5 >>  Acyclovir 8/5 >>  Dose adjustments this admission: Monitor renal function for any dose adjustments.  Microbiology results: 8/5 BCx: Staph hominus in 1/4 - contaminant 8/5 Urine - no  growth 8/5 CSF - ngtd 8/6 Resp Cx - reincubated  Thank you for allowing pharmacy to be a part of this patient's care.  Alanda Slim, PharmD, [facility] Clinical Pharmacist Please see AMION for all Pharmacists' Contact Phone Numbers 11/21/2020, 7:36 AM

## 2020-11-21 NOTE — Progress Notes (Signed)
NAME:  Margaret Rangel, MRN:  [id number], DOB:  Sep 02, 1945, LOS: 3 ADMISSION DATE:  12/14/2020, CONSULTATION DATE:  8/5 REFERRING MD:  picekring, CHIEF COMPLAINT:  seizure    History of Present Illness:  75 year old female with history as per below.  Per family was in usual state of health until 8/4 when her daughter noted she reported more fatigue than usual.  Otherwise no pertinent history observed.Found by her daughter this morning on 8/5 lying on the floor unresponsive.  She was last seen normal the night before.  When her daughter found her, she was nonverbal, unresponsive, had eye deviation upward.  She initiated CPR at the instructions of 911, and called EMS.  On EMS arrival they witnessed at least 5 generalized seizures.  These lasted 10 to 20 seconds apiece with forced gaze deviation to the right and twitching right upper extremity.  She was treated by EMS with 5 mg of midazolam in route, she was given another 2 mg in the emergency room for ongoing seizure activity, loaded with IV Keppra, intubated for airway protection, and neurology was consulted.  Continuous EEG was initiated, additional pertinent findings within the emergency room: New leukocytosis, low-grade fever, some postintubation hypotension which responded to volume resuscitation.  Critical care asked to admit.  Significant Hospital Events: Including procedures, antibiotic start and stop dates in addition to other pertinent events   8/5 intubated for airway protection after being given several doses of benzodiazapines and loaded w/ keppra. IV vanc,ceftriaxone and acyclovir started. LP planned. LTM initiated. CT head w/ encephalomalacia and multiple prior infacts.  8/6: LP was done yesterday, showing high white count, and neutrophil predominate.  EEG showed continuous seizure 8/7: EEG showed continuous seizure, propofol was uptitrated  Interim History / Subjective:  Patient remained afebrile Kept on high-dose propofol Awaiting EGD  this morning to see if propofol can be titrated down  Objective   Blood pressure (!) 107/58, pulse 60, temperature 97.6 F (36.4 C), temperature source Axillary, resp. rate 18, height '5\' 3"'$  (1.6 m), weight 65.8 kg, SpO2 100 %.    Vent Mode: PRVC FiO2 (%):  [40 %] 40 % Set Rate:  [18 bmp] 18 bmp Vt Set:  [420 mL] 420 mL PEEP:  [5 cmH20] 5 cmH20 Plateau Pressure:  [16 cmH20-18 cmH20] 16 cmH20   Intake/Output Summary (Last 24 hours) at 11/21/2020 1115 Last data filed at 11/21/2020 1100 Gross per 24 hour  Intake 4373.13 ml  Output 1825 ml  Net 2548.13 ml   Filed Weights   12/14/2020 1000 11/19/20 0500 11/20/20 0500  Weight: 65 kg 65.2 kg 65.8 kg    Examination: General: Critically ill looking elderly African-American female, lying on the bed, orally intubated HENT: Atraumatic, EEG leads are applied, pupils pinpoint, non-reactive, sclera nonicteric orally intubated Lungs: Breath sounds are clear to auscultation bilaterally, no wheezes or rhonchi Cardiovascular: Regular rate and rhythm systolic murmur audible Abdomen: Soft not tender Extremities: Warm dry Neuro: Sedated on propofol, eyes closed, not following commands Skin: No rash  Resolved Hospital Problem list   Acute kidney injury  Assessment & Plan:  Convulsive status epilepticus could 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 stroke versus acute meningitis/encephalitis EEG read pending this morning to see if patient continued to seize Still on high-dose propofol Continue Keppra, fosphenytoin and valproic acid Continue LTM  Seizure precautions MRI brain with and without contrast is pending Appreciate neurology follow-up and recommendations Continue broad-spectrum antibiotics  Sepsis due to probable acute meningitis/encephalitis and gram-positive pneumonia LP showed white  count 208, segmented neutrophil predominant with 9 monocyte Patient does have elevated protein but normal glucose She is on ampicillin, ceftriaxone and acyclovir Gram stain  of CSF is negative Follow-up cultures and HSV PCR MRSA screen came back negative Patient became afebrile Vancomycin was stopped ID consult is requested Respiratory culture grew gram-positive cocci in pairs On IV antibiotics Follow-up sensitivity results  Acute toxic/infectious encephalopathy.  Likely multifactorial, partially status, now postictal, sedating medications and also could be due to infection Patient is requiring deep sedation to control seizures  Acute hypoxic respiratory failure due to pneumonia and seizures  Initial chest x-ray clear but very concerned about possible aspiration event especially in light of history of esophageal strictures Respiratory culture grew gram-positive cocci in pairs Continue lung protective ventilation VAP bundle SAT/SBT as tolerated  AKI,  resolved  Hypokalemia Continue aggressive electrolyte supplement and monitor  Demand cardiac ischemia Patient serum troponins were elevated likely due to demand cardiac ischemia caused by sepsis and seizures Closely monitor  Diabetes type II Sliding scale insulin BG goal 140-180  Best Practice (right click and "Reselect all SmartList Selections" daily)   Diet/type: Tube feeds DVT prophylaxis: Enoxaparin GI prophylaxis: PPI Lines: PICC line, needed Foley:  Yes, and it is still needed Code Status:  full code Last date of multidisciplinary goals of care discussion: 8/7 patient's daughter was updated at bedside.  Continue full aggressive care  Labs   CBC: Recent Labs  Lab 12/07/2020 1034 12/01/2020 1039 11/29/2020 1132 11/19/20 0459 11/19/20 0505  WBC 17.4*  --   --   --  14.7*  NEUTROABS 13.6*  --   --   --   --   HGB 12.3 13.3  13.6 12.2 10.2* 10.7*  HCT 40.3 39.0  40.0 36.0 30.0* 32.9*  MCV 81.7  --   --   --  79.7*  PLT 340  --   --   --  [id number]    Basic Metabolic Panel: Recent Labs  Lab 11/19/20 0505 11/19/20 1736 11/20/20 0447 11/20/20 1615 11/20/20 2016 11/21/20 0106  11/21/20 0552  NA 143 139 142 142  --   --  142  K 4.1 3.4* 3.4* 2.8*  --  3.8 3.5  CL 109 107 110 110  --   --  111  CO2 21* 21* 23 23  --   --  23  GLUCOSE 140* 118* 156* 113*  --   --  111*  BUN '21 13 13 11  '$ --   --  8  CREATININE 1.14* 0.99 1.01* 0.90  --   --  0.92  CALCIUM 8.4* 7.7* 7.9* 7.9*  --   --  7.9*  MG 2.0  --   --   --  2.0  --   --   PHOS 2.3*  --   --   --   --   --   --    GFR: Estimated Creatinine Clearance: 47.5 mL/min (by C-G formula based on SCr of 0.92 mg/dL). Recent Labs  Lab 11/21/2020 1034 12/12/2020 1148 12/06/2020 1234 11/28/2020 1250 11/19/20 0505  PROCALCITON  --   --   --  3.04  --   WBC 17.4*  --   --   --  14.7*  LATICACIDVEN  --  3.9* 1.3  --   --     Liver Function Tests: Recent Labs  Lab 12/09/2020 1034  AST 97*  ALT 31  ALKPHOS 45  BILITOT 1.1  PROT 7.1  ALBUMIN 3.9  No results for input(s): LIPASE, AMYLASE in the last 168 hours. No results for input(s): AMMONIA in the last 168 hours.  ABG    Component Value Date/Time   PHART 7.414 11/19/2020 0459   PCO2ART 32.1 11/19/2020 0459   PO2ART 79 (L) 11/19/2020 0459   HCO3 20.4 11/19/2020 0459   TCO2 21 (L) 11/19/2020 0459   ACIDBASEDEF 3.0 (H) 11/19/2020 0459   O2SAT 95.0 11/19/2020 0459     Coagulation Profile: No results for input(s): INR, PROTIME in the last 168 hours.  Cardiac Enzymes: Recent Labs  Lab 12/02/2020 1034  CKTOTAL 4,572*    HbA1C: HbA1c, POC (controlled diabetic range)  Date/Time Value Ref Range Status  08/17/2020 02:25 PM 6.4 0.0 - 7.0 % Final  12/30/2019 02:18 PM 6.0 0.0 - 7.0 % Final   Hgb A1c MFr Bld  Date/Time Value Ref Range Status  11/24/2020 10:34 AM 7.1 (H) 4.8 - 5.6 % Final    Comment:    (NOTE) Pre diabetes:          5.7%-6.4%  Diabetes:              >6.4%  Glycemic control for   <7.0% adults with diabetes     CBG: Recent Labs  Lab 11/20/20 1548 11/20/20 1936 11/20/20 2318 11/21/20 0339 11/21/20 0742  GLUCAP 97 130* 93 109* 92     Total critical care time: 42 minutes  Performed by: [place] care time was exclusive of separately billable procedures and treating other patients.   Critical care was necessary to treat or prevent imminent or life-threatening deterioration.   Critical care was time spent personally by me on the following activities: development of treatment plan with patient and/or surrogate as well as nursing, discussions with consultants, evaluation of patient's response to treatment, examination of patient, obtaining history from patient or surrogate, ordering and performing treatments and interventions, ordering and review of laboratory studies, ordering and review of radiographic studies, pulse oximetry and re-evaluation of patient's condition.   Jacky Kindle MD [facility] See Amion for pager If no response to pager, please call [phone] until 7pm After 7pm, Please call E-link [phone]

## 2020-11-21 NOTE — Progress Notes (Signed)
Subjective: EEG continues to show burst attenuation without epileptiform discharges.  Therefore will load with fosphenytoin.  ROS: Unable to obtain due to poor mental status  Examination  Vital signs in last 24 hours: Temp:  [96.1 F (35.6 C)-97.7 F (36.5 C)] 97 F (36.1 C) (08/08 0345) Pulse Rate:  [53-66] 59 (08/08 0400) Resp:  [8-18] 18 (08/08 0400) BP: (92-144)/(38-70) 134/45 (08/08 0400) SpO2:  [94 %-100 %] 100 % (08/08 0400) FiO2 (%):  [40 %] 40 % (08/08 0400) Weight:  [65.8 kg] 65.8 kg (08/07 0500)  General: lying in bed, NAD CVS: pulse-normal rate and rhythm RS: intubated, symmetric expansion Extremities: normal , warm Neuro: On propofol '@70mcg'$ /hr, comatose, does not open eyes to noxious stimuli, does not follow commands, PERRLA, corneal reflex absent, gag reflex absent, flaccid, does not withdraw to noxious stimuli in all extremities  Basic Metabolic Panel: Recent Labs  Lab 12/02/2020 1829 11/19/20 0459 11/19/20 0505 11/19/20 1736 11/20/20 0447 11/20/20 1615 11/20/20 2016 11/21/20 0106  NA 144 144 143 139 142 142  --   --   K 3.2* 3.7 4.1 3.4* 3.4* 2.8*  --  3.8  CL 107  --  109 107 110 110  --   --   CO2 22  --  21* 21* 23 23  --   --   GLUCOSE 97  --  140* 118* 156* 113*  --   --   BUN 25*  --  '21 13 13 11  '$ --   --   CREATININE 1.33*  --  1.14* 0.99 1.01* 0.90  --   --   CALCIUM 8.6*  --  8.4* 7.7* 7.9* 7.9*  --   --   MG  --   --  2.0  --   --   --  2.0  --   PHOS  --   --  2.3*  --   --   --   --   --     CBC: Recent Labs  Lab 12/04/2020 1034 11/14/2020 1039 11/22/2020 1132 11/19/20 0459 11/19/20 0505  WBC 17.4*  --   --   --  14.7*  NEUTROABS 13.6*  --   --   --   --   HGB 12.3 13.3  13.6 12.2 10.2* 10.7*  HCT 40.3 39.0  40.0 36.0 30.0* 32.9*  MCV 81.7  --   --   --  79.7*  PLT 340  --   --   --  239     Coagulation Studies: No results for input(s): LABPROT, INR in the last 72 hours.  Imaging CTH wo contrast 11/27/2020: No acute intracranial  pathology.   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    ASSESSMENT AND PLAN: 75 y.o. female with PMH significant for prior stroke who presented via EMS with unresponsiveness and seizures. Had back to back seizures with no return to baseline in between and concern for status epilepticus. cEEG with left PELDS and BIRDs. Febrile in the ED and so LP was done with neutrophil predominant pleocytosis and elevated protein and glucose. LP was obtained after a dose of Antibiotics was given.  Status epilepticus ( resolved) Acute encephalopathy, infectious vs seizure - LTM EEG showed evidence of epileptogenicity arising from left hemisphere and right frontal region. Additionally, there is profound diffuse encephalopathy, non specific etiology but likely due to seizure and sedation. -Status epilepticus  likely due to underlying infarcts  Recommendations -Continue propofol at 31mg/hr -We will load with fosphenytoin and start Dilantin 100 mg every 8 hours -Increase Depakote to 350 mg every 6 hours, continue Keppra 1500 mg twice daily -We will obtain MRI brain with and without contrast to look for any acute abnormality -Continue LTM EEG while on propofol -Continue seizure precautions -as needed IV Ativan 2 mg for clinical seizure-like activity - Management of history of comorbidities per primary team - Discussed plan with daughter at bedside  ADDENDUM -MRI brain with and without contrast showed  1.Multiple small foci of restricted diffusion scattered in the left cerebral hemisphere, in a watershed distribution, consistent with acute/subacute infarct. 2. Mildly restricted diffusion involving the left hippocampus and left thalamus is most likely related to seizure activity in the setting of refractory seizures. 3. Moderate chronic microvascular ischemic changes of the  white matter. 4. Remote lacunar infarcts in the left frontal lobe, parietooccipital region, bilateral basal ganglia, thalami, pons and right cerebellar hemisphere.     - Will order CTA head and neck, TTE, lipid panel as part of stroke workup -Will also start aspirin 81 mg and atorvastatin 80 mg for secondary stroke prevention  CRITICAL CARE Performed by: PLora Havens  Total critical care time: 35 minutes  Critical care time was exclusive of separately billable procedures and treating other patients.  Critical care was necessary to treat or prevent imminent or life-threatening deterioration.  Critical care was time spent personally by me on the following activities: development of treatment plan with patient and/or surrogate as well as nursing, discussions with consultants, evaluation of patient's response to treatment, examination of patient, obtaining history from patient or surrogate, ordering and performing treatments and interventions, ordering and review of laboratory studies, ordering and review of radiographic studies, pulse oximetry and re-evaluation of patient's condition.   PZeb ComfortEpilepsy [facility] For questions after 5pm please refer to AMION to reach the Neurologist on call

## 2020-11-21 NOTE — Progress Notes (Signed)
Pt transported to MRI via vent with no complications noted. Pt transported back to 4N23. Unit RT aware of pt's return.

## 2020-11-22 ENCOUNTER — Inpatient Hospital Stay (HOSPITAL_COMMUNITY): Payer: Medicare Other

## 2020-11-22 ENCOUNTER — Ambulatory Visit: Payer: Medicare Other | Admitting: Vascular Surgery

## 2020-11-22 DIAGNOSIS — A419 Sepsis, unspecified organism: Secondary | ICD-10-CM | POA: Diagnosis not present

## 2020-11-22 DIAGNOSIS — I6389 Other cerebral infarction: Secondary | ICD-10-CM

## 2020-11-22 DIAGNOSIS — R4182 Altered mental status, unspecified: Secondary | ICD-10-CM

## 2020-11-22 DIAGNOSIS — G0490 Encephalitis and encephalomyelitis, unspecified: Secondary | ICD-10-CM | POA: Diagnosis not present

## 2020-11-22 DIAGNOSIS — I639 Cerebral infarction, unspecified: Secondary | ICD-10-CM | POA: Diagnosis not present

## 2020-11-22 DIAGNOSIS — G40901 Epilepsy, unspecified, not intractable, with status epilepticus: Secondary | ICD-10-CM | POA: Diagnosis not present

## 2020-11-22 LAB — ALBUMIN: Albumin: 1.9 g/dL — ABNORMAL LOW (ref 3.5–5.0)

## 2020-11-22 LAB — CRYPTOCOCCAL ANTIGEN: Crypto Ag: NEGATIVE

## 2020-11-22 LAB — GLUCOSE, CAPILLARY
Glucose-Capillary: 114 mg/dL — ABNORMAL HIGH (ref 70–99)
Glucose-Capillary: 147 mg/dL — ABNORMAL HIGH (ref 70–99)
Glucose-Capillary: 117 mg/dL — ABNORMAL HIGH (ref 70–99)
Glucose-Capillary: 116 mg/dL — ABNORMAL HIGH (ref 70–99)
Glucose-Capillary: 135 mg/dL — ABNORMAL HIGH (ref 70–99)

## 2020-11-22 LAB — ECHOCARDIOGRAM COMPLETE BUBBLE STUDY
AR max vel: 1.38 cm2
AV Area VTI: 1.19 cm2
AV Area mean vel: 1.35 cm2
AV Mean grad: 10 mmHg
AV Peak grad: 18.8 mmHg
Ao pk vel: 2.17 m/s
Area-P 1/2: 2.84 cm2
P 1/2 time: 688 msec
Single Plane A4C EF: 81.4 %

## 2020-11-22 LAB — CSF CULTURE W GRAM STAIN: Culture: NO GROWTH

## 2020-11-22 LAB — PATHOLOGIST SMEAR REVIEW

## 2020-11-22 LAB — HIV ANTIBODY (ROUTINE TESTING W REFLEX): HIV Screen 4th Generation wRfx: NONREACTIVE

## 2020-11-22 LAB — VALPROIC ACID LEVEL: Valproic Acid Lvl: 55 ug/mL (ref 50.0–100.0)

## 2020-11-22 LAB — PHENYTOIN LEVEL, TOTAL: Phenytoin Lvl: 14 ug/mL (ref 10.0–20.0)

## 2020-11-22 LAB — RPR: RPR Ser Ql: NONREACTIVE

## 2020-11-22 LAB — TRIGLYCERIDES: Triglycerides: 738 mg/dL — ABNORMAL HIGH (ref <150)

## 2020-11-22 MED ORDER — PHENOBARBITAL SODIUM 65 MG/ML IJ SOLN
65.0000 mg | Freq: Two times a day (BID) | INTRAMUSCULAR | Status: DC
  Administered 2020-11-22 (×2): 65 mg via INTRAVENOUS
  Filled 2020-11-22 (×2): qty 1

## 2020-11-22 MED ORDER — MIDAZOLAM-SODIUM CHLORIDE 100-0.9 MG/100ML-% IV SOLN
0.0000 mg/h | INTRAVENOUS | Status: DC
  Administered 2020-11-22: 2 mg/h via INTRAVENOUS
  Administered 2020-11-23: 10 mg/h via INTRAVENOUS
  Administered 2020-11-24: 15 mg/h via INTRAVENOUS
  Administered 2020-11-24: 10 mg/h via INTRAVENOUS
  Administered 2020-11-24 – 2020-11-26 (×6): 15 mg/h via INTRAVENOUS
  Filled 2020-11-22 (×11): qty 100

## 2020-11-22 MED ORDER — IOHEXOL 350 MG/ML SOLN
100.0000 mL | Freq: Once | INTRAVENOUS | Status: AC | PRN
  Administered 2020-11-22: 100 mL via INTRAVENOUS

## 2020-11-22 MED ORDER — PANTOPRAZOLE SODIUM 40 MG PO PACK
40.0000 mg | PACK | Freq: Every day | ORAL | Status: DC
  Administered 2020-11-22 – 2020-11-29 (×8): 40 mg
  Filled 2020-11-22 (×8): qty 20

## 2020-11-22 MED ORDER — SODIUM CHLORIDE 0.9 % IV SOLN
INTRAVENOUS | Status: DC | PRN
  Administered 2020-11-22 – 2020-11-26 (×2): 500 mL via INTRAVENOUS

## 2020-11-22 MED ORDER — VITAL AF 1.2 CAL PO LIQD
1000.0000 mL | ORAL | Status: DC
  Administered 2020-11-22 – 2020-11-29 (×7): 1000 mL
  Filled 2020-11-22: qty 1000

## 2020-11-22 MED ORDER — PROSOURCE TF PO LIQD
45.0000 mL | Freq: Every day | ORAL | Status: DC
  Administered 2020-11-22 – 2020-11-29 (×8): 45 mL
  Filled 2020-11-22 (×8): qty 45

## 2020-11-22 MED ORDER — IOHEXOL 350 MG/ML SOLN: 100.0000 mL | Freq: Once | INTRAVENOUS | Status: DC | PRN

## 2020-11-22 MED ORDER — MIDAZOLAM BOLUS VIA INFUSION
5.0000 mg | Freq: Once | INTRAVENOUS | Status: AC
  Administered 2020-11-22: 5 mg via INTRAVENOUS
  Filled 2020-11-22: qty 5

## 2020-11-22 NOTE — Progress Notes (Signed)
Patient transported to CT scan on full support and back to 4N23. No complications.

## 2020-11-22 NOTE — Progress Notes (Signed)
*  PRELIMINARY RESULTS* Echocardiogram 2D Echocardiogram has been performed with agitated saline study.  Luisa Hart RDCS 11/22/2020, 9:14 AM

## 2020-11-22 NOTE — Progress Notes (Signed)
[facility]  Date of Admission:  11/15/2020     Reason for Consult: sepsis, seizure, meningoencephalitis                            Referring Provider: Jacky Kindle         Lines:  8/06-c rue picc 8/05-c foley 8/05-c ETT   Abx: 8/05-c ceftriaxone 8/05-c ampicillin  8/05-8/09 acyclovir 8/05 vanc                                                             Assessment: 75 y.o. 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 This suggest a viral process but some early bacterial process could be sources as well.   Csf hsv/vzv pcr, serum CrAg negative Csf cx so far negative Hiv/rpr screen negative   There are no other metastatic involvement outside of the cns  Cta head/neck doesn't suggest vasculitic process  ?true sepsis or stroke/seizure related with fever Given uncertainty, will also send out csf pcr which is more sensitive than culture to r/o occult infection  As of 8/9 ongoing seiure but no longer hyperthermic. On multiple antiepileptic meds. Off and on low dose levophed in setting sedatives and phenytoin iv   Plan: Stop acyclovir Continue ceftriaxone/ampicillin for now Send out csf pcr Supportive care/seizure tx per primary team/neurology  I spent more than 35 minute reviewing data/chart, and coordinating care and >50% direct face to face time providing counseling/discussing diagnostics/treatment plan with patient   Active Problems:   Status epilepticus ([place])   Severe sepsis with septic shock ([place])   Meningoencephalitis   Cerebrovascular accident (CVA) ([place])   Allergies  Allergen Reactions   Codeine Itching   Penicillins Itching    Has patient had a PCN reaction causing immediate rash, facial/tongue/throat swelling,  SOB or lightheadedness with hypotension:No Has patient had a PCN reaction causing severe rash involving mucus membranes or skin necrosis:No Has patient had a PCN reaction that required hopititalization:No Has patient had a PCN reaction occurring within the last 10 years:No If all of the above answers are "NO", then may proceed with Cephalosporin use.     Scheduled Meds:  aspirin  81 mg Per Tube Daily   atorvastatin  80 mg Per Tube Daily   chlorhexidine gluconate (MEDLINE KIT)  15 mL Mouth Rinse BID   Chlorhexidine Gluconate Cloth  6 each Topical Daily   enoxaparin (LOVENOX) injection  40 mg Subcutaneous Daily   feeding supplement (PROSource TF)  45 mL Per Tube Daily   insulin aspart  0-9 Units Subcutaneous Q4H   mouth rinse  15 mL Mouth Rinse 10 times per day   pantoprazole sodium  40 mg Per Tube Q1200   PHENObarbital  65 mg Intravenous BID   phenytoin (DILANTIN) IV  100 mg Intravenous Q8H   sodium chloride flush  10-40 mL Intracatheter Q12H   Continuous Infusions:  sodium chloride 10 mL/hr at 11/22/20 1200   ampicillin (OMNIPEN) IV Stopped (11/22/20 0959)   cefTRIAXone (ROCEPHIN)  IV 2  g (11/22/20 1345)   feeding supplement (VITAL AF 1.2 CAL) 1,000 mL (11/22/20 1444)   levETIRAcetam Stopped (11/22/20 0923)   midazolam 2 mg/hr (11/22/20 1200)   norepinephrine (LEVOPHED) Adult infusion 2 mcg/min (11/22/20 1200)   propofol (DIPRIVAN) infusion 40 mcg/kg/min (11/22/20 1413)   valproate sodium 53.5 mL/hr at 11/22/20 1200   PRN Meds:.sodium chloride, acetaminophen (TYLENOL) oral liquid 160 mg/5 mL, docusate sodium, fentaNYL (SUBLIMAZE) injection, fentaNYL (SUBLIMAZE) injection, gadobutrol, polyethylene glycol, sodium chloride flush   SUBJECTIVE: Patient remains sedated/intubated No longer hypothermic Ongoing seizure activities  Stable low o2 requirement (40% vent) Intermittent low dose levophed needed in setting sedation/phenytoin iv  No rash No n/v/diarrhea  Cta head/neck no  suggestion vasculitis  Review of Systems: ROS All other ROS was negative, except mentioned above     OBJECTIVE: Vitals:   11/22/20 1330 11/22/20 1345 11/22/20 1400 11/22/20 1415  BP: (!) 128/49 (!) 123/48 (!) 113/45 (!) 123/46  Pulse: (!) 58 (!) 58 (!) 56 (!) 58  Resp: '18 18 18 18  ' Temp:      TempSrc:      SpO2: 100% 100% 100% 100%  Weight:      Height:       Body mass index is 25.81 kg/m.  Physical Exam General/constitutional: intubated/sedated HEENT: Normocephalic, PER, Conj Clear, EOMI, Oropharynx with ett intact Neck supple CV: rrr no mrg Lungs: clear to auscultation, normal respiratory effort Abd: Soft, Nontender Ext: no edema Skin: No Rash Neuro: deferred MSK: no peripheral joint swelling/tenderness/warmth   Lab Results Lab Results  Component Value Date   WBC 14.7 (H) 11/19/2020   HGB 10.7 (L) 11/19/2020   HCT 32.9 (L) 11/19/2020   MCV 79.7 (L) 11/19/2020   PLT 239 11/19/2020    Lab Results  Component Value Date   CREATININE 0.92 11/21/2020   BUN 8 11/21/2020   NA 142 11/21/2020   K 3.5 11/21/2020   CL 111 11/21/2020   CO2 23 11/21/2020    Lab Results  Component Value Date   ALT 31 12/01/2020   AST 97 (H) 11/29/2020   ALKPHOS 45 12/01/2020   BILITOT 1.1 12/03/2020      Microbiology: Recent Results (from the past 240 hour(s))  Resp Panel by RT-PCR (Flu A&B, Covid) Nasopharyngeal Swab     Status: None   Collection Time: 12/02/2020 10:39 AM   Specimen: Nasopharyngeal Swab; Nasopharyngeal(NP) swabs in vial transport medium  Result Value Ref Range Status   SARS Coronavirus 2 by RT PCR NEGATIVE NEGATIVE Final    Comment: (NOTE) SARS-CoV-2 target nucleic acids are NOT DETECTED.  The SARS-CoV-2 RNA is generally detectable in upper respiratory specimens during the acute phase of infection. The lowest concentration of SARS-CoV-2 viral copies this assay can detect is 138 copies/mL. A negative result does not preclude SARS-Cov-2 infection and  should not be used as the sole basis for treatment or other patient management decisions. A negative result may occur with  improper specimen collection/handling, submission of specimen other than nasopharyngeal swab, presence of viral mutation(s) within the areas targeted by this assay, and inadequate number of viral copies(<138 copies/mL). A negative result must be combined with clinical observations, patient history, and epidemiological information. The expected result is Negative.  Fact Sheet for Patients:  EntrepreneurPulse.com.au  Fact Sheet for Healthcare Providers:  IncredibleEmployment.be  This test is no t yet approved or cleared by the [place] FDA and  has been authorized for detection and/or diagnosis of SARS-CoV-2 by FDA under an Emergency Use  Authorization (EUA). This EUA will remain  in effect (meaning this test can be used) for the duration of the COVID-19 declaration under Section 564(b)(1) of the Act, 21 U.S.C.section 360bbb-3(b)(1), unless the authorization is terminated  or revoked sooner.       Influenza A by PCR NEGATIVE NEGATIVE Final   Influenza B by PCR NEGATIVE NEGATIVE Final    Comment: (NOTE) The Xpert Xpress SARS-CoV-2/FLU/RSV plus assay is intended as an aid in the diagnosis of influenza from Nasopharyngeal swab specimens and should not be used as a sole basis for treatment. Nasal washings and aspirates are unacceptable for Xpert Xpress SARS-CoV-2/FLU/RSV testing.  Fact Sheet for Patients: EntrepreneurPulse.com.au  Fact Sheet for Healthcare Providers: IncredibleEmployment.be  This test is not yet approved or cleared by the [place] FDA and has been authorized for detection and/or diagnosis of SARS-CoV-2 by FDA under an Emergency Use Authorization (EUA). This EUA will remain in effect (meaning this test can be used) for the duration of the COVID-19 declaration  under Section 564(b)(1) of the Act, 21 U.S.C. section 360bbb-3(b)(1), unless the authorization is terminated or revoked.  Performed at [facility], [place] [address], [place], [place] [address]   Blood culture (routine x 2)     Status: Abnormal   Collection Time: 12/02/2020 12:34 PM   Specimen: BLOOD  Result Value Ref Range Status   Specimen Description BLOOD LEFT ANTECUBITAL  Final   Special Requests   Final    BOTTLES DRAWN AEROBIC AND ANAEROBIC Blood Culture adequate volume   Culture  Setup Time   Final    GRAM POSITIVE COCCI IN CLUSTERS AEROBIC BOTTLE ONLY CRITICAL RESULT CALLED TO, READ BACK BY AND VERIFIED WITH: V. BRYK,PHARMD 0701 11/19/2020 T. TYSOR    Culture (A)  Final    STAPHYLOCOCCUS HOMINIS THE SIGNIFICANCE OF ISOLATING THIS ORGANISM FROM A SINGLE SET OF BLOOD CULTURES WHEN MULTIPLE SETS ARE DRAWN IS UNCERTAIN. PLEASE NOTIFY THE MICROBIOLOGY DEPARTMENT WITHIN ONE WEEK IF SPECIATION AND SENSITIVITIES ARE REQUIRED. Performed at [facility], [place] [address], [place], [place] [address]    Report Status 11/21/2020 FINAL  Final  Blood Culture ID Panel (Reflexed)     Status: Abnormal   Collection Time: 11/14/2020 12:34 PM  Result Value Ref Range Status   Enterococcus faecalis NOT DETECTED NOT DETECTED Final   Enterococcus Faecium NOT DETECTED NOT DETECTED Final   Listeria monocytogenes NOT DETECTED NOT DETECTED Final   Staphylococcus species DETECTED (A) NOT DETECTED Final    Comment: CRITICAL RESULT CALLED TO, READ BACK BY AND VERIFIED WITH: V. BRYK,PHARMD 0701 11/19/2020 T. TYSOR    Staphylococcus aureus (BCID) NOT DETECTED NOT DETECTED Final   Staphylococcus epidermidis NOT DETECTED NOT DETECTED Final   Staphylococcus lugdunensis NOT DETECTED NOT DETECTED Final   Streptococcus species NOT DETECTED NOT DETECTED Final   Streptococcus agalactiae NOT DETECTED NOT DETECTED Final   Streptococcus pneumoniae NOT DETECTED NOT DETECTED Final   Streptococcus pyogenes NOT  DETECTED NOT DETECTED Final   A.calcoaceticus-baumannii NOT DETECTED NOT DETECTED Final   Bacteroides fragilis NOT DETECTED NOT DETECTED Final   Enterobacterales NOT DETECTED NOT DETECTED Final   Enterobacter cloacae complex NOT DETECTED NOT DETECTED Final   Escherichia coli NOT DETECTED NOT DETECTED Final   Klebsiella aerogenes NOT DETECTED NOT DETECTED Final   Klebsiella oxytoca NOT DETECTED NOT DETECTED Final   Klebsiella pneumoniae NOT DETECTED NOT DETECTED Final   Proteus species NOT DETECTED NOT DETECTED Final   Salmonella species NOT DETECTED  NOT DETECTED Final   Serratia marcescens NOT DETECTED NOT DETECTED Final   Haemophilus influenzae NOT DETECTED NOT DETECTED Final   Neisseria meningitidis NOT DETECTED NOT DETECTED Final   Pseudomonas aeruginosa NOT DETECTED NOT DETECTED Final   Stenotrophomonas maltophilia NOT DETECTED NOT DETECTED Final   Candida albicans NOT DETECTED NOT DETECTED Final   Candida auris NOT DETECTED NOT DETECTED Final   Candida glabrata NOT DETECTED NOT DETECTED Final   Candida krusei NOT DETECTED NOT DETECTED Final   Candida parapsilosis NOT DETECTED NOT DETECTED Final   Candida tropicalis NOT DETECTED NOT DETECTED Final   Cryptococcus neoformans/gattii NOT DETECTED NOT DETECTED Final    Comment: Performed at [facility], [place] [address] [place], [place] [address]  Urine Culture     Status: None   Collection Time: 11/15/2020  1:40 PM   Specimen: Urine, Catheterized  Result Value Ref Range Status   Specimen Description URINE, CATHETERIZED  Final   Special Requests NONE  Final   Culture   Final    NO GROWTH Performed at [facility], [place] [address], [place], [place] [address]    Report Status 11/19/2020 FINAL  Final  VZV PCR, CSF     Status: None   Collection Time: 11/25/2020  5:02 PM   Specimen: CSF; Cerebrospinal Fluid  Result Value Ref Range Status   VZV PCR, CSF Negative Negative Final    Comment: (NOTE) No Varicella Zoster Virus DNA  detected. Performed At: [facility] [place], [place] [id number] Rush Farmer MD UV:[id number]   CSF culture w Gram Stain     Status: None   Collection Time: 11/29/2020  5:02 PM   Specimen: CSF  Result Value Ref Range Status   Specimen Description CSF  Final   Special Requests NONE  Final   Gram Stain   Final    WBC PRESENT,BOTH PMN AND MONONUCLEAR NO ORGANISMS SEEN CYTOSPIN SMEAR    Culture   Final    NO GROWTH Performed at [facility], [address], [place], [place] [address]    Report Status 11/22/2020 FINAL  Final  Blood culture (routine x 2)     Status: None (Preliminary result)   Collection Time: 12/09/2020  6:23 PM   Specimen: BLOOD RIGHT HAND  Result Value Ref Range Status   Specimen Description BLOOD RIGHT HAND  Final   Special Requests   Final    AEROBIC BOTTLE ONLY Blood Culture results may not be optimal due to an inadequate volume of blood received in culture bottles   Culture   Final    NO GROWTH 4 DAYS Performed at [facility], [place] [address], [place], [place] [address]    Report Status PENDING  Incomplete  MRSA Next Gen by PCR, Nasal     Status: None   Collection Time: 11/19/20  9:24 AM   Specimen: Nasal Mucosa; Nasal Swab  Result Value Ref Range Status   MRSA by PCR Next Gen NOT DETECTED NOT DETECTED Final    Comment: (NOTE) The GeneXpert MRSA Assay (FDA approved for NASAL specimens only), is one component of a comprehensive MRSA colonization surveillance program. It is not intended to diagnose MRSA infection nor to guide or monitor treatment for MRSA infections. Test performance is not FDA approved in patients less than 37 years old. Performed at [facility], [place] [address] [place], [place] [address]   Culture, Respiratory w Gram Stain  Status: None   Collection Time: 11/19/20  9:27 AM   Specimen: Tracheal Aspirate; Respiratory  Result Value Ref Range Status   Specimen Description TRACHEAL ASPIRATE   Final   Special Requests NONE  Final   Gram Stain   Final    ABUNDANT WBC PRESENT, PREDOMINANTLY PMN RARE GRAM POSITIVE COCCI IN PAIRS RARE GRAM VARIABLE ROD    Culture   Final    RARE Normal respiratory flora-no Staph aureus or Pseudomonas seen Performed at [facility], [place] [address] [place], [place] [address]    Report Status 11/21/2020 FINAL  Final     Serology:   Imaging: If present, new imagings (plain films, ct scans, and mri) have been personally visualized and interpreted; radiology reports have been reviewed. Decision making incorporated into the Impression / Recommendations.  8/05 cxr No abnormality   8/8 mri brain 1. Multiple small foci of restricted diffusion scattered in the left cerebral hemisphere, in a watershed distribution, consistent with acute/subacute infarct. 2. Mildly restricted diffusion involving the left hippocampus and left thalamus is most likely related to seizure activity in the setting of refractory seizures. 3. Moderate chronic microvascular ischemic changes of the white matter. 4. Remote lacunar infarcts in the left frontal lobe, parietooccipital region, bilateral basal ganglia, thalami, pons and right cerebellar hemisphere.    8/9 cta head/neck 1. Negative CTA for large vessel occlusion. 2. Atheromatous change about the carotid bifurcations/proximal ICAs with associated stenoses of up to 40% bilaterally. Additional 50% stenosis about the left CCA. 3. Atheromatous change at the origins of both vertebral arteries with associated moderate to severe ostial stenoses, right worse than left. 4. Moderate to advanced atheromatous change throughout the intracranial circulation. Associated moderate multifocal stenoses involving the carotid siphons. No other proximal high-grade or correctable stenosis. 5. Irregularity and beading involving the distal cervical ICAs bilaterally, suggesting mild changes of FMD.    Jabier Mutton,  [place] [facility] [place] [phone] pager    11/22/2020, 3:20 PM

## 2020-11-22 NOTE — Progress Notes (Addendum)
NAME:  Margaret Rangel, MRN:  [id number], DOB:  1946-03-22, LOS: 4 ADMISSION DATE:  12/13/2020, CONSULTATION DATE:  8/5 REFERRING MD:  picekring, CHIEF COMPLAINT:  seizure    History of Present Illness:  75 year old female with history as per below.  Per family was in usual state of health until 8/4 when her daughter noted she reported more fatigue than usual.  Otherwise no pertinent history observed.Found by her daughter this morning on 8/5 lying on the floor unresponsive.  She was last seen normal the night before.  When her daughter found her, she was nonverbal, unresponsive, had eye deviation upward.  She initiated CPR at the instructions of 911, and called EMS.  On EMS arrival they witnessed at least 5 generalized seizures.  These lasted 10 to 20 seconds apiece with forced gaze deviation to the right and twitching right upper extremity.  She was treated by EMS with 5 mg of midazolam in route, she was given another 2 mg in the emergency room for ongoing seizure activity, loaded with IV Keppra, intubated for airway protection, and neurology was consulted.  Continuous EEG was initiated, additional pertinent findings within the emergency room: New leukocytosis, low-grade fever, some postintubation hypotension which responded to volume resuscitation.  Critical care asked to admit.  Significant Hospital Events: Including procedures, antibiotic start and stop dates in addition to other pertinent events   8/5 intubated for airway protection after being given several doses of benzodiazapines and loaded w/ keppra. IV vanc,ceftriaxone and acyclovir started. LP planned. LTM initiated. CT head w/ encephalomalacia and multiple prior infacts.  8/6: LP was done yesterday, showing high white count, and neutrophil predominate.  EEG showed continuous seizure 8/7: EEG showed continuous seizure, propofol was uptitrated 8/8: MRI multiple small foci thru left cerebral hemisphere in watershed distribution. Restricted  diffusion left thalamus and hippocampus c/w refractory seizure, chronic microvascular disease. Remote lacunar infacts in left fronta, parietaloccipital, bilateral BG, thalami, pons and R cerebellar hemisphere. Started on ASA and lipitor. CTA of head and neck as well as TTE ordered. LTM EEG showed evidence of epileptogenicity arising from left hemisphere and right frontal region. Additionally, there is profound diffuse encephalopathy, non specific etiology but likely due to seizure and sedation. AEDs adjusted. (Phosphenytoin loaded and dilantin started. Depakote dose increased to 350 q6). ID consulted for meningioencephalitis. They felt CSF c/w viral vs early bacterial process. Recommended continuing amp/CTX and acyclovir. Added HCM screen for syphilis and HIV, also cryptococcal Ag.  8/9 CT angio: negative for large vessel occlusion. Atheromatous change about the carotid bifurcations/proximal ICAs., both vertebral arteries, and t/o intracranial circulation.  Crypto ag neg, CSF culture: neg, Varicella zoster: neg. HSV neg. Culture analysis to date unrevealing. TGs climbing to 738 (started at 189 three days prior. Added versed. Prop capped at 40. Phenobarb added  Interim History / Subjective:  Still having seizures.   Objective   Blood pressure (Abnormal) 123/44, pulse (Abnormal) 59, temperature (Abnormal) 97.5 F (36.4 C), temperature source Axillary, resp. rate 18, height '5\' 3"'$  (1.6 m), weight 66.1 kg, SpO2 100 %.    Vent Mode: PRVC FiO2 (%):  [40 %] 40 % Set Rate:  [18 bmp] 18 bmp Vt Set:  [420 mL] 420 mL PEEP:  [5 cmH20] 5 cmH20 Plateau Pressure:  [14 cmH20-17 cmH20] 15 cmH20   Intake/Output Summary (Last 24 hours) at 11/22/2020 0945 Last data filed at 11/22/2020 0900 Gross per 24 hour  Intake 4386.65 ml  Output 1730 ml  Net 2656.65 ml  Filed Weights   11/19/20 0500 11/20/20 0500 11/22/20 0500  Weight: 65.2 kg 65.8 kg 66.1 kg    Examination: General still sedated. Still on LTM no  wheezing HENT: Orally intubated. PERRL. Sclera not icteric Pulm. Equal. Coarse bilateral rhonchi. Min vent support.  Card rrr + systolic heart murmur  Abd soft not tender. + bowel sounds tol FTs Ext diffuse anasarca. Pulses strong Neuro sedated and on LTM  GU clear yellow w/ [facility]  Resolved Hospital Problem list   Acute kidney injury Sepsis  Assessment & Plan:  Convulsive status epilepticus could 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 stroke versus acute meningitis/encephalitis -MRI w/ diffuse microvascular disease. CTa neg for large vessel infarct.  Plan Cont high dose propofol: TGs climbing. Adding versed. Capping prop at 40 Cont LTM w/ AED titration as directed by neuro/epileptology: currently on keppra, dilantin and valproic acid in addition to prop.  Phenobarb added Antimicrobials per below Seizure precautions    Meningoencephalitis  Now being followed by ID. Working dx viral vs early bacterial process. All cultures neg/ HSV neg. Crypto ag neg, CSF culture: neg, Varicella zoster: neg Plan ID following. Will stop acyclovir  Day 5 amp and ctx  Acute toxic/infectious encephalopathy:  multifactorial, status epilepticus, sedating meds, CNS infection. Also multiple areas of small infarct on MRI Plan Supportive care Cont deep sedation until seizures resolved.   Acute hypoxic respiratory failure due to aspiration pneumonia and seizures -sputum nml flora -now s/p 5d ctx Plan  Full vent support  VAP bundle Am cxr No wean for now   Fluid and electrolyte imbalance: Hypokalemia Plan F/u and replace as indicated   Demand cardiac ischemia Patient serum troponins were elevated likely due to demand cardiac ischemia caused by sepsis and seizures Plan Echo ordered Cont tele   Diabetes type II Plan Ssi  Goal 140-180  Best Practice (right click and "Reselect all SmartList Selections" daily)   Diet/type: Tube feeds DVT prophylaxis: Enoxaparin GI prophylaxis: PPI Lines: PICC line,  needed Foley:  Yes, and it is still needed Code Status:  full code Last date of multidisciplinary goals of care discussion: 8/7 patient's daughter was updated at bedside.  Continue full aggressive care  My cct 34 min  Erick Colace ACNP-BC [place] Pager # [phone] OR # [phone] if no answer

## 2020-11-22 NOTE — Progress Notes (Signed)
LTM maintenance completed; no skin breakdown was seen.

## 2020-11-22 NOTE — Procedures (Signed)
Patient Name: Margaret Rangel  MRN: [id number]  Epilepsy Attending: Lora Havens  Referring Physician/Provider: Dr Donnetta Simpers Duration: 11/21/2020 1120 to 11/22/2020 1120   Patient history:  75 y.o. female with PMH significant for prior stroke who presented via EMS with unresponsiveness and seizures. Had back to back seizures with no return to baseline in between. EEG due to concern for status epilepticus.    Level of alertness:  comatose   AEDs during EEG study: LEV, VPA, PHT, propofol   Technical aspects: This EEG study was done with scalp electrodes positioned according to the 10-20 International system of electrode placement. Electrical activity was acquired at a sampling rate of '500Hz'$  and reviewed with a high frequency filter of '70Hz'$  and a low frequency filter of '1Hz'$ . EEG data were recorded continuously and digitally stored.   Description: EEG showed burst suppression pattern with bursts of generalized highly epileptiform discharges lasting 1 to 2 seconds as well as generalized EEG suppression lasting 5-7 seconds.   ABNORMALITY -Burst suppression with highly epileptiform discharges, generalized   IMPRESSION: This study showed evidence of epileptogenicity with generalized onset. Additionally, there is profound diffuse encephalopathy, non specific etiology but likely due to seizure and sedation.     Abbigael Detlefsen [name]

## 2020-11-22 NOTE — Progress Notes (Signed)
Subjective: Continues to have burst suppression with highly epileptiform discharges.  ROS: Unable to obtain due to poor mental status  Examination  Vital signs in last 24 hours: Temp:  [95.7 F (35.4 C)-99.2 F (37.3 C)] 98.2 F (36.8 C) (08/09 1200) Pulse Rate:  [56-74] 58 (08/09 1200) Resp:  [16-21] 18 (08/09 1200) BP: (79-142)/(38-85) 127/52 (08/09 1200) SpO2:  [98 %-100 %] 100 % (08/09 1200) FiO2 (%):  [40 %] 40 % (08/09 1122) Weight:  [66.1 kg] 66.1 kg (08/09 0500)  General: lying in bed, NAD CVS: pulse-normal rate and rhythm RS: intubated, symmetric expansion Extremities: normal , warm Neuro: On propofol '@40mcg'$ /hr and versed '@2mg'$ /hr, comatose, does not open eyes to noxious stimuli, does not follow commands, PERRLA, corneal reflex absent, gag reflex absent, flaccid, does not withdraw to noxious stimuli in all extremities    Basic Metabolic Panel: Recent Labs  Lab 11/19/20 0505 11/19/20 1736 11/20/20 0447 11/20/20 1615 11/20/20 2016 11/21/20 0106 11/21/20 0552  NA 143 139 142 142  --   --  142  K 4.1 3.4* 3.4* 2.8*  --  3.8 3.5  CL 109 107 110 110  --   --  111  CO2 21* 21* 23 23  --   --  23  GLUCOSE 140* 118* 156* 113*  --   --  111*  BUN '21 13 13 11  '$ --   --  8  CREATININE 1.14* 0.99 1.01* 0.90  --   --  0.92  CALCIUM 8.4* 7.7* 7.9* 7.9*  --   --  7.9*  MG 2.0  --   --   --  2.0  --   --   PHOS 2.3*  --   --   --   --   --   --     CBC: Recent Labs  Lab 12/13/2020 1034 12/09/2020 1039 11/26/2020 1132 11/19/20 0459 11/19/20 0505  WBC 17.4*  --   --   --  14.7*  NEUTROABS 13.6*  --   --   --   --   HGB 12.3 13.3  13.6 12.2 10.2* 10.7*  HCT 40.3 39.0  40.0 36.0 30.0* 32.9*  MCV 81.7  --   --   --  79.7*  PLT 340  --   --   --  239     Coagulation Studies: No results for input(s): LABPROT, INR in the last 72 hours.  Imaging CTA head and neck 11/21/2020:  1. Negative CTA for large vessel occlusion. 2. Atheromatous change about the carotid  bifurcations/proximal ICAs with associated stenoses of up to 40% bilaterally. Additional 50% stenosis about the left CCA. 3. Atheromatous change at the origins of both vertebral arteries with associated moderate to severe ostial stenoses, right worse than left. 4. Moderate to advanced atheromatous change throughout the intracranial circulation. Associated moderate multifocal stenoses involving the carotid siphons. No other proximal high-grade or correctable stenosis. 5. Irregularity and beading involving the distal cervical ICAs bilaterally, suggesting mild changes of FMD.  TTE 11/22/2020: No PFO, no thrombus   ASSESSMENT AND PLAN: 75 y.o. female with PMH significant for prior stroke who presented via EMS with unresponsiveness and seizures. Had back to back seizures with no return to baseline in between and concern for status epilepticus. cEEG with left PELDS and BIRDs. Febrile in the ED and so LP was done with neutrophil predominant pleocytosis and elevated protein and glucose. LP was obtained after a dose of Antibiotics was given.   Status epilepticus (  resolved) Acute encephalopathy, infectious vs seizure Hypertriglyceridemia - LTM EEG continues to show burst suppression with highly epileptiform discharges -Status epilepticus likely due to underlying infarcts   Recommendations -Reduce propofol to 40 MCG per hour due to hypertriglyceridemia.  Start Versed at 2 mL/h.  Versed can be increased if patient has frequent seizures -We will also start on phenobarbital 65 mg twice daily -Continue Depakote 350 mg every 6 hours, Keppra 1500 mg twice daily, Dilantin 100 mg every 8 hours -If any further seizures, can load with Vimpat -Continue LTM EEG while on sedation -HSV PCR negative, can DC acyclovir -Continue seizure precautions -as needed IV Ativan 2 mg for clinical seizure-like activity - Management of history of comorbidities per primary team - Discussed plan with critical care  team  CRITICAL CARE Performed by: Lora Havens   Total critical care time: 35 minutes  Critical care time was exclusive of separately billable procedures and treating other patients.  Critical care was necessary to treat or prevent imminent or life-threatening deterioration.  Critical care was time spent personally by me on the following activities: development of treatment plan with patient and/or surrogate as well as nursing, discussions with consultants, evaluation of patient's response to treatment, examination of patient, obtaining history from patient or surrogate, ordering and performing treatments and interventions, ordering and review of laboratory studies, ordering and review of radiographic studies, pulse oximetry and re-evaluation of patient's condition. Zeb Comfort Epilepsy [facility] For questions after 5pm please refer to AMION to reach the Neurologist on call

## 2020-11-22 NOTE — Progress Notes (Signed)
Initial Nutrition Assessment  DOCUMENTATION CODES:   Not applicable  INTERVENTION:   Initiate tube feeding via OG tube: Vital AF 1.2 at 45 ml/h (1080 ml per day) Prosource TF 45 ml daily  Provides 1336 kcal, 92 gm protein, 875 ml free water daily TF regimen and propofol at current rate providing 2048 total kcal/day    NUTRITION DIAGNOSIS:   Inadequate oral intake related to inability to eat as evidenced by NPO status.  GOAL:   Patient will meet greater than or equal to 90% of their needs  MONITOR:   TF tolerance  REASON FOR ASSESSMENT:   Consult Enteral/tube feeding initiation and management  ASSESSMENT:   Pt with PMH of HTN, IBS, GERD, hernia, HLD, stroke x 2, and DM admitted from home unresponsive with seizures.    Pt discussed during ICU rounds and with RN. Per MD in rounds ok to advance TF.  Pt treated for seizures, and sepsis du to probable acute meningitis/encephalitis and PNA.   8/5 intubated after given several doses of benzodiazapines and keppra 8/6 s/p LP, EEG shows continuous seizures 8/7 started on propofol for continuous seizures 8/8 trickle TF started 8/9 TGs elevated, propofol capped at 40, added versed.   Patient is currently intubated on ventilator support MV: 7.4 L/min Temp (24hrs), Avg:97.7 F (36.5 C), Min:95.7 F (35.4 C), Max:99.2 F (37.3 C)  MAP: 53-72  Propofol: 27 ml/hr provides: 712 kcal  Medications reviewed and include: SSI, protonix, phenobarbital LR @ 100 ml/hr Keppra Versed  Labs reviewed: TG: 738 CBG's: 114-147 16 F OG tube; gastric per xray   Current TF:  Vital High Protein @ 40 ml/h Provides: 960 kcal and 84 grams protein   NUTRITION - FOCUSED PHYSICAL EXAM:  Flowsheet Row Most Recent Value  Orbital Region No depletion  Upper Arm Region No depletion  Thoracic and Lumbar Region No depletion  Buccal Region Unable to assess  Temple Region Unable to assess  [continuous EEG]  Clavicle Bone Region No depletion   Clavicle and Acromion Bone Region No depletion  Scapular Bone Region Unable to assess  Dorsal Hand No depletion  Patellar Region No depletion  Anterior Thigh Region No depletion  Posterior Calf Region No depletion  Edema (RD Assessment) Mild  Hair Reviewed  Eyes Unable to assess  Mouth Unable to assess  Skin Reviewed  Nails Reviewed       Diet Order:   Diet Order     None       EDUCATION NEEDS:   Not appropriate for education at this time  Skin:  Skin Assessment: Reviewed RN Assessment  Last BM:  unknown  Height:   Ht Readings from Last 1 Encounters:  11/20/20 '5\' 3"'$  (1.6 m)    Weight:   Wt Readings from Last 1 Encounters:  11/22/20 66.1 kg    BMI:  Body mass index is 25.81 kg/m.  Estimated Nutritional Needs:   Kcal:  1700-2000  Protein:  85-100 grams  Fluid:  >1.7 L/day  Lockie Pares., RD, LDN, CNSC See AMiON for contact information

## 2020-11-23 ENCOUNTER — Inpatient Hospital Stay (HOSPITAL_COMMUNITY): Payer: Medicare Other

## 2020-11-23 DIAGNOSIS — R4182 Altered mental status, unspecified: Secondary | ICD-10-CM | POA: Diagnosis not present

## 2020-11-23 LAB — MISC LABCORP TEST (SEND OUT): Labcorp test code: 2013305

## 2020-11-23 LAB — TRIGLYCERIDES: Triglycerides: 91 mg/dL (ref <150)

## 2020-11-23 LAB — BASIC METABOLIC PANEL
Anion gap: 6 (ref 5–15)
BUN: 13 mg/dL (ref 8–23)
CO2: 23 mmol/L (ref 22–32)
Calcium: 7.5 mg/dL — ABNORMAL LOW (ref 8.9–10.3)
Chloride: 114 mmol/L — ABNORMAL HIGH (ref 98–111)
Creatinine, Ser: 1.02 mg/dL — ABNORMAL HIGH (ref 0.44–1.00)
GFR, Estimated: 57 mL/min — ABNORMAL LOW (ref >60)
Glucose, Bld: 127 mg/dL — ABNORMAL HIGH (ref 70–99)
Potassium: 3.2 mmol/L — ABNORMAL LOW (ref 3.5–5.1)
Sodium: 143 mmol/L (ref 135–145)

## 2020-11-23 LAB — CBC WITH DIFFERENTIAL/PLATELET
Abs Immature Granulocytes: 0.06 10*3/uL (ref 0.00–0.07)
Basophils Absolute: 0 10*3/uL (ref 0.0–0.1)
Basophils Relative: 0 %
Eosinophils Absolute: 0.1 10*3/uL (ref 0.0–0.5)
Eosinophils Relative: 2 %
HCT: 27.8 % — ABNORMAL LOW (ref 36.0–46.0)
Hemoglobin: 8.7 g/dL — ABNORMAL LOW (ref 12.0–15.0)
Immature Granulocytes: 1 %
Lymphocytes Relative: 33 %
Lymphs Abs: 1.5 10*3/uL (ref 0.7–4.0)
MCH: 25.4 pg — ABNORMAL LOW (ref 26.0–34.0)
MCHC: 31.3 g/dL (ref 30.0–36.0)
MCV: 81 fL (ref 80.0–100.0)
Monocytes Absolute: 0.5 10*3/uL (ref 0.1–1.0)
Monocytes Relative: 11 %
Neutro Abs: 2.4 10*3/uL (ref 1.7–7.7)
Neutrophils Relative %: 53 %
Platelets: 202 10*3/uL (ref 150–400)
RBC: 3.43 MIL/uL — ABNORMAL LOW (ref 3.87–5.11)
RDW: 17.5 % — ABNORMAL HIGH (ref 11.5–15.5)
WBC: 4.5 10*3/uL (ref 4.0–10.5)
nRBC: 1.6 % — ABNORMAL HIGH (ref 0.0–0.2)

## 2020-11-23 LAB — GLUCOSE, CAPILLARY
Glucose-Capillary: 126 mg/dL — ABNORMAL HIGH (ref 70–99)
Glucose-Capillary: 128 mg/dL — ABNORMAL HIGH (ref 70–99)
Glucose-Capillary: 112 mg/dL — ABNORMAL HIGH (ref 70–99)
Glucose-Capillary: 149 mg/dL — ABNORMAL HIGH (ref 70–99)
Glucose-Capillary: 174 mg/dL — ABNORMAL HIGH (ref 70–99)

## 2020-11-23 LAB — CULTURE, BLOOD (ROUTINE X 2): Culture: NO GROWTH

## 2020-11-23 MED ORDER — POTASSIUM CHLORIDE 10 MEQ/50ML IV SOLN
10.0000 meq | INTRAVENOUS | Status: AC
Start: 2020-11-23 — End: 2020-11-23
  Administered 2020-11-23 (×4): 10 meq via INTRAVENOUS
  Filled 2020-11-23 (×4): qty 50

## 2020-11-23 MED ORDER — MIDAZOLAM BOLUS VIA INFUSION
4.0000 mg | Freq: Once | INTRAVENOUS | Status: AC
  Administered 2020-11-23: 4 mg via INTRAVENOUS
  Filled 2020-11-23: qty 4

## 2020-11-23 MED ORDER — PERAMPANEL 2 MG PO TABS
2.0000 mg | ORAL_TABLET | Freq: Once | ORAL | Status: AC
  Filled 2020-11-23: qty 1

## 2020-11-23 MED ORDER — PERAMPANEL 2 MG PO TABS
16.0000 mg | ORAL_TABLET | ORAL | Status: AC
  Administered 2020-11-23: 16 mg
  Filled 2020-11-23: qty 8

## 2020-11-23 MED ORDER — POTASSIUM CHLORIDE 20 MEQ PO PACK
60.0000 meq | PACK | ORAL | Status: AC
  Administered 2020-11-23 (×3): 60 meq
  Filled 2020-11-23 (×3): qty 3

## 2020-11-23 MED ORDER — PHENOBARBITAL SODIUM 65 MG/ML IJ SOLN
130.0000 mg | Freq: Two times a day (BID) | INTRAMUSCULAR | Status: DC
  Administered 2020-11-23 – 2020-11-27 (×10): 130 mg via INTRAVENOUS
  Filled 2020-11-23 (×10): qty 2

## 2020-11-23 MED ORDER — POTASSIUM CHLORIDE 20 MEQ PO PACK
20.0000 meq | PACK | ORAL | Status: DC
  Filled 2020-11-23: qty 1

## 2020-11-23 NOTE — Progress Notes (Signed)
LTM maint complete - no skin breakdown under:  Fp1 F3 F7  Maintenance C3 T8 [facility] monitored, Event button test confirmed by [facility].

## 2020-11-23 NOTE — Procedures (Addendum)
Patient Name: Margaret Rangel  MRN: [id number]  Epilepsy Attending: Lora Havens  Referring Physician/Provider: Dr Donnetta Simpers Duration: 11/22/2020 1120 to 11/23/2020 1120   Patient history:  75 y.o. female with PMH significant for prior stroke who presented via EMS with unresponsiveness and seizures. Had back to back seizures with no return to baseline in between. EEG due to concern for status epilepticus.    Level of alertness:  comatose   AEDs during EEG study: LEV, VPA, PHT, propofol, versed   Technical aspects: This EEG study was done with scalp electrodes positioned according to the 10-20 International system of electrode placement. Electrical activity was acquired at a sampling rate of '500Hz'$  and reviewed with a high frequency filter of '70Hz'$  and a low frequency filter of '1Hz'$ . EEG data were recorded continuously and digitally stored.   Description: EEG showed burst suppression pattern with bursts of generalized highly epileptiform discharges lasting 2-3 seconds.  There was also generalized EEG suppression lasting 5-7 seconds which gradually became shorter lasting 1 to 2 seconds. Versed was increased on 11/23/2020 at around 830 after which the periods of suppression again became longer lasting 5 to 7 seconds.    ABNORMALITY -Burst suppression with highly epileptiform discharges, generalized   IMPRESSION: This study showed evidence of epileptogenicity with generalized onset and high potential for seizure recurrence. Additionally, there is profound diffuse encephalopathy, non specific etiology but likely due to seizure and sedation.   Moon Budde [name]

## 2020-11-23 NOTE — Progress Notes (Addendum)
Subjective: Continues to have highly epileptiform discharges overnight.  2 daughters at bedside  ROS: Unable to obtain due to poor mental status  Examination  Vital signs in last 24 hours: Temp:  [97.5 F (36.4 C)-99.6 F (37.6 C)] 99.6 F (37.6 C) (08/10 0800) Pulse Rate:  [53-79] 72 (08/10 1000) Resp:  [10-18] 18 (08/10 1000) BP: (97-158)/(38-64) 149/49 (08/10 1000) SpO2:  [98 %-100 %] 100 % (08/10 1000) FiO2 (%):  [40 %] 40 % (08/10 0752)    General: lying in bed, NAD CVS: pulse-normal rate and rhythm RS: intubated, symmetric expansion Extremities: Warm, dependent edema Neuro: On propofol '@40mcg'$ /hr and versed '@5mg'$ /hr, comatose, does not open eyes to noxious stimuli, does not follow commands, PERRLA, corneal reflex absent, gag reflex absent, flaccid, does not withdraw to noxious stimuli in all extremities  Basic Metabolic Panel: Recent Labs  Lab 11/19/20 0505 11/19/20 1736 11/20/20 0447 11/20/20 1615 11/20/20 2016 11/21/20 0106 11/21/20 0552 11/23/20 0518  NA 143 139 142 142  --   --  142 143  K 4.1 3.4* 3.4* 2.8*  --  3.8 3.5 3.2*  CL 109 107 110 110  --   --  111 114*  CO2 21* 21* 23 23  --   --  23 23  GLUCOSE 140* 118* 156* 113*  --   --  111* 127*  BUN '21 13 13 11  '$ --   --  8 13  CREATININE 1.14* 0.99 1.01* 0.90  --   --  0.92 1.02*  CALCIUM 8.4* 7.7* 7.9* 7.9*  --   --  7.9* 7.5*  MG 2.0  --   --   --  2.0  --   --   --   PHOS 2.3*  --   --   --   --   --   --   --     CBC: Recent Labs  Lab 11/20/2020 1034 11/17/2020 1039 12/10/2020 1132 11/19/20 0459 11/19/20 0505 11/23/20 0518  WBC 17.4*  --   --   --  14.7* 4.5  NEUTROABS 13.6*  --   --   --   --  2.4  HGB 12.3 13.3  13.6 12.2 10.2* 10.7* 8.7*  HCT 40.3 39.0  40.0 36.0 30.0* 32.9* 27.8*  MCV 81.7  --   --   --  79.7* 81.0  PLT 340  --   --   --  239 202     Coagulation Studies: No results for input(s): LABPROT, INR in the last 72 hours.  Imaging No new brain imaging overnight  ASSESSMENT  AND PLAN: 75 y.o. female with PMH significant for prior stroke who presented via EMS with unresponsiveness and seizures. Had back to back seizures with no return to baseline in between and concern for status epilepticus. cEEG with left PELDS and BIRDs. Febrile in the ED and so LP was done with neutrophil predominant pleocytosis and elevated protein and glucose. LP was obtained after a dose of Antibiotics was given.   Status epilepticus ( resolved) Acute encephalopathy, infectious vs seizure Hypertriglyceridemia ( resolved) - LTM EEG continues to show burst suppression with highly epileptiform discharges -Status epilepticus likely due to underlying infarcts   Recommendations -Continue propofol to 40 MCG per hour, increase Versed to 10 mL/h.  Versed can be increased if patient has frequent seizures -Will load with perampanel 16 mg once -Will increase phenobarbital to 130 mg twice daily -Continue Depakote 350 mg every 6 hours, Keppra 1500 mg twice daily,  Dilantin 100 mg every 8 hours -Continue LTM EEG while on sedation -Discussed with ID team as well as critical care team.  If seizures persist, will consider CT chest abdomen pelvis s part of neoplastic/paraneoplastic work-up and start IV steroids -Continue seizure precautions -as needed IV Ativan 2 mg for clinical seizure-like activity - Management of history of comorbidities per primary team - Discussed plan with critical care team, updated both daughters at bedside   CRITICAL CARE Performed by: Lora Havens   Total critical care time: 35 minutes   Critical care time was exclusive of separately billable procedures and treating other patients.   Critical care was necessary to treat or prevent imminent or life-threatening deterioration.   Critical care was time spent personally by me on the following activities: development of treatment plan with patient and/or surrogate as well as nursing, discussions with consultants, evaluation of  patient's response to treatment, examination of patient, obtaining history from patient or surrogate, ordering and performing treatments and interventions, ordering and review of laboratory studies, ordering and review of radiographic studies, pulse oximetry and re-evaluation of patient's condition.   Zeb Comfort Epilepsy [facility] For questions after 5pm please refer to AMION to reach the Neurologist on call

## 2020-11-23 NOTE — Plan of Care (Signed)
  Problem: Nutrition: Goal: Adequate nutrition will be maintained Outcome: Progressing   Problem: Safety: Goal: Ability to remain free from injury will improve Outcome: Progressing   Problem: Activity: Goal: Risk for activity intolerance will decrease Outcome: Not Progressing

## 2020-11-23 NOTE — Progress Notes (Signed)
[facility] ADULT ICU REPLACEMENT PROTOCOL   The patient does apply for the [facility] Adult ICU Electrolyte Replacment Protocol based on the criteria listed below:   1.Exclusion criteria: TCTS patients, ECMO patients and Hypothermia Protocol, and   Dialysis patients 2. Is GFR >/= 30 ml/min? Yes.    Patient's GFR today is 57 3. Is SCr </= 2? Yes.   Patient's SCr is 1.02 mg/dL 4. Did SCr increase >/= 0.5 in 24 hours? No. 5.Pt's weight >40kg  Yes.   6. Abnormal electrolyte(s): K 3.2  7. Electrolytes replaced per protocol   Christeen Douglas 11/23/2020 6:46 AM

## 2020-11-23 NOTE — Progress Notes (Signed)
Critical value called from radiology. Recommend retracting ETT 2-3cm. Dr. Tacy Learn notified. New order to retract 2cm. RT at bedside and ETT retracted 2cm.

## 2020-11-23 NOTE — Progress Notes (Signed)
Margaret Rangel for Infectious Disease  Date of Admission:  11/27/2020     Reason for Consult: sepsis, seizure, meningoencephalitis                            Referring Provider: Jacky Kindle         Lines:  8/06-c rue picc 8/05-c foley 8/05-c ETT   Abx: 8/05-c ceftriaxone 8/05-c ampicillin  8/05-8/09 acyclovir 8/05 vanc                                                             Assessment: 75 y.o. 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 This suggest a viral process but some early bacterial process could be sources as well.   Csf hsv/vzv pcr, serum CrAg negative Csf cx so far negative Hiv/rpr screen negative   There are no other metastatic involvement outside of the cns  Cta head/neck doesn't suggest vasculitic process  ?true sepsis or stroke/seizure related with fever Given uncertainty, will also send out csf pcr which is more sensitive than culture to r/o occult infection   8/10 assessment Still ongoing seizure despite escalating multiple drug regimen of antiepileptic No longer febrile since admission though Hsv pcr negative Csf cx negative  I discussed with neurology team today regarding ddx of her current presentation. Autoimmune/paraneoplastic in consideration given status epilepticus; this is in setting of negative ID w/u thus far  Pending csf pcr. If this is negative, inclined to stop amp/ceftriaxone  One thing ID to keep in the ddx is tb meningitis. Brain mri, improved fever without specific tb treatment are reasurring. If quantiferon gold is positive or new fever, would repeat LP and send for 7-10 mL of AFB culture  Nursing staff asking about the tongue appearing swollen and hx mild ampicillin allergy. I do not see it as a causal of the  tongue changes. Would keep for now pending csf pcr  Plan: Continue amp/ceftriaxone meningitis dose F/u csf pcr (expecting in 1-2 days) Send quantiferon gold Supportive care/seizure tx per primary team/neurology Discussed with primary team/neurology   I spent more than 35 minute reviewing data/chart, and coordinating care and >50% direct face to face time providing counseling/discussing diagnostics/treatment plan with patient   Active Problems:   Status epilepticus ([place])   Severe sepsis with septic shock (H[facility])   Meningoencephalitis   Cerebrovascular accident (CVA) ([place])   Altered mental status   Allergies  Allergen Reactions   Codeine Itching   Penicillins Itching    Has patient had a PCN reaction causing immediate rash, facial/tongue/throat swelling, SOB or lightheadedness with hypotension:No Has patient had a PCN reaction causing severe rash involving mucus membranes or skin necrosis:No Has patient had a PCN reaction that required hopititalization:No Has patient had a PCN reaction occurring within the last 10 years:No If all of the above answers are "NO", then may proceed with Cephalosporin use.     Scheduled Meds:  aspirin  81 mg Per Tube Daily   atorvastatin  80 mg Per Tube Daily   chlorhexidine  gluconate (MEDLINE KIT)  15 mL Mouth Rinse BID   Chlorhexidine Gluconate Cloth  6 each Topical Daily   enoxaparin (LOVENOX) injection  40 mg Subcutaneous Daily   feeding supplement (PROSource TF)  45 mL Per Tube Daily   insulin aspart  0-9 Units Subcutaneous Q4H   mouth rinse  15 mL Mouth Rinse 10 times per day   pantoprazole sodium  40 mg Per Tube Q1200   perampanel  16 mg Per Tube STAT   PHENObarbital  130 mg Intravenous BID   phenytoin (DILANTIN) IV  100 mg Intravenous Q8H   potassium chloride  60 mEq Per Tube Q4H   sodium chloride flush  10-40 mL Intracatheter Q12H   Continuous Infusions:  sodium chloride 10 mL/hr at 11/23/20 0900   ampicillin (OMNIPEN) IV 2 g  (11/23/20 1131)   cefTRIAXone (ROCEPHIN)  IV 2 g (11/23/20 0118)   feeding supplement (VITAL AF 1.2 CAL) 45 mL/hr at 11/22/20 1900   levETIRAcetam 1,500 mg (11/23/20 1053)   midazolam 5 mg/hr (11/23/20 0900)   norepinephrine (LEVOPHED) Adult infusion 1 mcg/min (11/23/20 0900)   potassium chloride 10 mEq (11/23/20 1130)   propofol (DIPRIVAN) infusion 40 mcg/kg/min (11/23/20 1046)   valproate sodium Stopped (11/23/20 0621)   PRN Meds:.sodium chloride, acetaminophen (TYLENOL) oral liquid 160 mg/5 mL, docusate sodium, fentaNYL (SUBLIMAZE) injection, fentaNYL (SUBLIMAZE) injection, gadobutrol, polyethylene glycol, sodium chloride flush   SUBJECTIVE: No rash/v/diarrhea Remains intubated/sedated -- ongoing seizure activities No fever chill Per nursign staff tongue appear enlarged  No mucosal lesion No hemodynamic disturbance - intermittent whiff levophed in setting sedatives/iv phenytoin  Spoke with neurology about the case   Review of Systems: ROS All other ROS was negative, except mentioned above     OBJECTIVE: Vitals:   11/23/20 1030 11/23/20 1045 11/23/20 1100 11/23/20 1120  BP: (!) 132/49 (!) 134/42 (!) 132/44   Pulse: 70 70 68 67  Resp: _0 Temp:      TempSrc:      SpO2: 100% 99% 100% 100%  Weight:      Height:       Body mass index is 25.81 kg/m.  Physical Exam General/constitutional: intubated/sedated-comatose HEENT: Normocephalic, PER, Conj Clear, EOMI, Oropharynx with ett intact; tongue visualized no obvious rash on tongue or oral mucosa Neck supple CV: rrr no mrg Lungs: clear to auscultation, normal respiratory effort on vent 40% fio2 Abd: Soft, Nontender Ext: trace edema on extremities Skin: No Rash Neuro: deferred MSK: no peripheral joint swelling/tenderness/warmth; back spines nontender  Lab Results Lab Results  Component Value Date   WBC 4.5 11/23/2020   HGB 8.7 (L) 11/23/2020   HCT 27.8 (L) 11/23/2020   MCV 81.0 11/23/2020   PLT 202  11/23/2020    Lab Results  Component Value Date   CREATININE 1.02 (H) 11/23/2020   BUN 13 11/23/2020   NA 143 11/23/2020   K 3.2 (L) 11/23/2020   CL 114 (H) 11/23/2020   CO2 23 11/23/2020    Lab Results  Component Value Date   ALT 31 12/14/2020   AST 97 (H) 11/19/2020   ALKPHOS 45 11/25/2020   BILITOT 1.1 12/08/2020      Microbiology: Recent Results (from the past 240 hour(s))  Resp Panel by RT-PCR (Flu A&B, Covid) Nasopharyngeal Swab     Status: None   Collection Time: 12/02/2020 10:39 AM   Specimen: Nasopharyngeal Swab; Nasopharyngeal(NP) swabs in vial transport medium  Result Value Ref Range Status   SARS Coronavirus 2  by RT PCR NEGATIVE NEGATIVE Final    Comment: (NOTE) SARS-CoV-2 target nucleic acids are NOT DETECTED.  The SARS-CoV-2 RNA is generally detectable in upper respiratory specimens during the acute phase of infection. The lowest concentration of SARS-CoV-2 viral copies this assay can detect is 138 copies/mL. A negative result does not preclude SARS-Cov-2 infection and should not be used as the sole basis for treatment or other patient management decisions. A negative result may occur with  improper specimen collection/handling, submission of specimen other than nasopharyngeal swab, presence of viral mutation(s) within the areas targeted by this assay, and inadequate number of viral copies(<138 copies/mL). A negative result must be combined with clinical observations, patient history, and epidemiological information. The expected result is Negative.  Fact Sheet for Patients:  EntrepreneurPulse.com.au  Fact Sheet for Healthcare Providers:  IncredibleEmployment.be  This test is no t yet approved or cleared by the [place] FDA and  has been authorized for detection and/or diagnosis of SARS-CoV-2 by FDA under an Emergency Use Authorization (EUA). This EUA will remain  in effect (meaning this test can be used) for the  duration of the COVID-19 declaration under Section 564(b)(1) of the Act, 21 U.S.C.section 360bbb-3(b)(1), unless the authorization is terminated  or revoked sooner.       Influenza A by PCR NEGATIVE NEGATIVE Final   Influenza B by PCR NEGATIVE NEGATIVE Final    Comment: (NOTE) The Xpert Xpress SARS-CoV-2/FLU/RSV plus assay is intended as an aid in the diagnosis of influenza from Nasopharyngeal swab specimens and should not be used as a sole basis for treatment. Nasal washings and aspirates are unacceptable for Xpert Xpress SARS-CoV-2/FLU/RSV testing.  Fact Sheet for Patients: EntrepreneurPulse.com.au  Fact Sheet for Healthcare Providers: IncredibleEmployment.be  This test is not yet approved or cleared by the [place] FDA and has been authorized for detection and/or diagnosis of SARS-CoV-2 by FDA under an Emergency Use Authorization (EUA). This EUA will remain in effect (meaning this test can be used) for the duration of the COVID-19 declaration under Section 564(b)(1) of the Act, 21 U.S.C. section 360bbb-3(b)(1), unless the authorization is terminated or revoked.  Performed at [facility], [place] [address] [place], [place] [address]   Blood culture (routine x 2)     Status: Abnormal   Collection Time: 12/12/2020 12:34 PM   Specimen: BLOOD  Result Value Ref Range Status   Specimen Description BLOOD LEFT ANTECUBITAL  Final   Special Requests   Final    BOTTLES DRAWN AEROBIC AND ANAEROBIC Blood Culture adequate volume   Culture  Setup Time   Final    GRAM POSITIVE COCCI IN CLUSTERS AEROBIC BOTTLE ONLY CRITICAL RESULT CALLED TO, READ BACK BY AND VERIFIED WITH: V. BRYK,PHARMD 0701 11/19/2020 T. TYSOR    Culture (A)  Final    STAPHYLOCOCCUS HOMINIS THE SIGNIFICANCE OF ISOLATING THIS ORGANISM FROM A SINGLE SET OF BLOOD CULTURES WHEN MULTIPLE SETS ARE DRAWN IS UNCERTAIN. PLEASE NOTIFY THE MICROBIOLOGY DEPARTMENT WITHIN ONE WEEK IF  SPECIATION AND SENSITIVITIES ARE REQUIRED. Performed at [facility], [place] [address], [place], [place] [address]    Report Status 11/21/2020 FINAL  Final  Blood Culture ID Panel (Reflexed)     Status: Abnormal   Collection Time: 12/03/2020 12:34 PM  Result Value Ref Range Status   Enterococcus faecalis NOT DETECTED NOT DETECTED Final   Enterococcus Faecium NOT DETECTED NOT DETECTED Final   Listeria monocytogenes NOT DETECTED NOT DETECTED Final   Staphylococcus species DETECTED (A)  NOT DETECTED Final    Comment: CRITICAL RESULT CALLED TO, READ BACK BY AND VERIFIED WITH: V. BRYK,PHARMD 0701 11/19/2020 T. TYSOR    Staphylococcus aureus (BCID) NOT DETECTED NOT DETECTED Final   Staphylococcus epidermidis NOT DETECTED NOT DETECTED Final   Staphylococcus lugdunensis NOT DETECTED NOT DETECTED Final   Streptococcus species NOT DETECTED NOT DETECTED Final   Streptococcus agalactiae NOT DETECTED NOT DETECTED Final   Streptococcus pneumoniae NOT DETECTED NOT DETECTED Final   Streptococcus pyogenes NOT DETECTED NOT DETECTED Final   A.calcoaceticus-baumannii NOT DETECTED NOT DETECTED Final   Bacteroides fragilis NOT DETECTED NOT DETECTED Final   Enterobacterales NOT DETECTED NOT DETECTED Final   Enterobacter cloacae complex NOT DETECTED NOT DETECTED Final   Escherichia coli NOT DETECTED NOT DETECTED Final   Klebsiella aerogenes NOT DETECTED NOT DETECTED Final   Klebsiella oxytoca NOT DETECTED NOT DETECTED Final   Klebsiella pneumoniae NOT DETECTED NOT DETECTED Final   Proteus species NOT DETECTED NOT DETECTED Final   Salmonella species NOT DETECTED NOT DETECTED Final   Serratia marcescens NOT DETECTED NOT DETECTED Final   Haemophilus influenzae NOT DETECTED NOT DETECTED Final   Neisseria meningitidis NOT DETECTED NOT DETECTED Final   Pseudomonas aeruginosa NOT DETECTED NOT DETECTED Final   Stenotrophomonas maltophilia NOT DETECTED NOT DETECTED Final   Candida albicans NOT DETECTED NOT  DETECTED Final   Candida auris NOT DETECTED NOT DETECTED Final   Candida glabrata NOT DETECTED NOT DETECTED Final   Candida krusei NOT DETECTED NOT DETECTED Final   Candida parapsilosis NOT DETECTED NOT DETECTED Final   Candida tropicalis NOT DETECTED NOT DETECTED Final   Cryptococcus neoformans/gattii NOT DETECTED NOT DETECTED Final    Comment: Performed at [facility], [address], [place], [place] [address]  Urine Culture     Status: None   Collection Time: 12/12/2020  1:40 PM   Specimen: Urine, Catheterized  Result Value Ref Range Status   Specimen Description URINE, CATHETERIZED  Final   Special Requests NONE  Final   Culture   Final    NO GROWTH Performed at [facility], [place] [address] [place], [place] [address]    Report Status 11/19/2020 FINAL  Final  VZV PCR, CSF     Status: None   Collection Time: 11/23/2020  5:02 PM   Specimen: CSF; Cerebrospinal Fluid  Result Value Ref Range Status   VZV PCR, CSF Negative Negative Final    Comment: (NOTE) No Varicella Zoster Virus DNA detected. Performed At: [facility] [place], [place] [id number] Rush Farmer MD AT:[id number]   CSF culture w Gram Stain     Status: None   Collection Time: 12/03/2020  5:02 PM   Specimen: CSF  Result Value Ref Range Status   Specimen Description CSF  Final   Special Requests NONE  Final   Gram Stain   Final    WBC PRESENT,BOTH PMN AND MONONUCLEAR NO ORGANISMS SEEN CYTOSPIN SMEAR    Culture   Final    NO GROWTH Performed at [facility], [address] [place],  [address]    Report Status 11/22/2020 FINAL  Final  Blood culture (routine x 2)     Status: None   Collection Time: 11/26/2020  6:23 PM   Specimen: BLOOD RIGHT HAND  Result Value Ref Range Status   Specimen Description BLOOD RIGHT HAND  Final   Special Requests   Final    AEROBIC BOTTLE ONLY Blood Culture results may  not be optimal due to an inadequate volume of blood received in  culture bottles   Culture   Final    NO GROWTH 5 DAYS Performed at [facility], [place] [address], [place], [place] [address]    Report Status 11/23/2020 FINAL  Final  MRSA Next Gen by PCR, Nasal     Status: None   Collection Time: 11/19/20  9:24 AM   Specimen: Nasal Mucosa; Nasal Swab  Result Value Ref Range Status   MRSA by PCR Next Gen NOT DETECTED NOT DETECTED Final    Comment: (NOTE) The GeneXpert MRSA Assay (FDA approved for NASAL specimens only), is one component of a comprehensive MRSA colonization surveillance program. It is not intended to diagnose MRSA infection nor to guide or monitor treatment for MRSA infections. Test performance is not FDA approved in patients less than 15 years old. Performed at [facility], [place] [address] [place], [place] [address]   Culture, Respiratory w Gram Stain     Status: None   Collection Time: 11/19/20  9:27 AM   Specimen: Tracheal Aspirate; Respiratory  Result Value Ref Range Status   Specimen Description TRACHEAL ASPIRATE  Final   Special Requests NONE  Final   Gram Stain   Final    ABUNDANT WBC PRESENT, PREDOMINANTLY PMN RARE GRAM POSITIVE COCCI IN PAIRS RARE GRAM VARIABLE ROD    Culture   Final    RARE Normal respiratory flora-no Staph aureus or Pseudomonas seen Performed at [facility], [place] [address] [place], [place] [address]    Report Status 11/21/2020 FINAL  Final     Serology:   Imaging: If present, new imagings (plain films, ct scans, and mri) have been personally visualized and interpreted; radiology reports have been reviewed. Decision making incorporated into the Impression / Recommendations.  8/05 cxr No abnormality   8/8 mri brain 1. Multiple small foci of restricted diffusion scattered in the left cerebral hemisphere, in a watershed distribution, consistent with acute/subacute infarct. 2. Mildly restricted diffusion involving the left hippocampus and left thalamus is most likely  related to seizure activity in the setting of refractory seizures. 3. Moderate chronic microvascular ischemic changes of the white matter. 4. Remote lacunar infarcts in the left frontal lobe, parietooccipital region, bilateral basal ganglia, thalami, pons and right cerebellar hemisphere.    8/9 cta head/neck 1. Negative CTA for large vessel occlusion. 2. Atheromatous change about the carotid bifurcations/proximal ICAs with associated stenoses of up to 40% bilaterally. Additional 50% stenosis about the left CCA. 3. Atheromatous change at the origins of both vertebral arteries with associated moderate to severe ostial stenoses, right worse than left. 4. Moderate to advanced atheromatous change throughout the intracranial circulation. Associated moderate multifocal stenoses involving the carotid siphons. No other proximal high-grade or correctable stenosis. 5. Irregularity and beading involving the distal cervical ICAs bilaterally, suggesting mild changes of FMD.    Jabier Mutton, [place] for Infectious [place] [phone] pager    11/23/2020, 11:40 AM

## 2020-11-23 NOTE — Progress Notes (Signed)
NAME:  Margaret Rangel, MRN:  [id number], DOB:  1946-03-28, LOS: 5 ADMISSION DATE:  12/09/2020, CONSULTATION DATE:  8/5 REFERRING MD:  picekring, CHIEF COMPLAINT:  seizure    History of Present Illness:  75 year old female with history as per below.  Per family was in usual state of health until 8/4 when her daughter noted she reported more fatigue than usual.  Otherwise no pertinent history observed.Found by her daughter this morning on 8/5 lying on the floor unresponsive.  She was last seen normal the night before.  When her daughter found her, she was nonverbal, unresponsive, had eye deviation upward.  She initiated CPR at the instructions of 911, and called EMS.  On EMS arrival they witnessed at least 5 generalized seizures.  These lasted 10 to 20 seconds apiece with forced gaze deviation to the right and twitching right upper extremity.  She was treated by EMS with 5 mg of midazolam in route, she was given another 2 mg in the emergency room for ongoing seizure activity, loaded with IV Keppra, intubated for airway protection, and neurology was consulted.  Continuous EEG was initiated, additional pertinent findings within the emergency room: New leukocytosis, low-grade fever, some postintubation hypotension which responded to volume resuscitation.  Critical care asked to admit.  Significant Hospital Events: Including procedures, antibiotic start and stop dates in addition to other pertinent events   8/5 intubated for airway protection after being given several doses of benzodiazapines and loaded w/ keppra. IV vanc,ceftriaxone and acyclovir started. LP planned. LTM initiated. CT head w/ encephalomalacia and multiple prior infacts.  8/6: LP was done yesterday, showing high white count, and neutrophil predominate.  EEG showed continuous seizure 8/7: EEG showed continuous seizure, propofol was uptitrated 8/8: MRI multiple small foci thru left cerebral hemisphere in watershed distribution. Restricted  diffusion left thalamus and hippocampus c/w refractory seizure, chronic microvascular disease. Remote lacunar infacts in left fronta, parietaloccipital, bilateral BG, thalami, pons and R cerebellar hemisphere. Started on ASA and lipitor. CTA of head and neck as well as TTE ordered. LTM EEG showed evidence of epileptogenicity arising from left hemisphere and right frontal region. Additionally, there is profound diffuse encephalopathy, non specific etiology but likely due to seizure and sedation. AEDs adjusted. (Phosphenytoin loaded and dilantin started. Depakote dose increased to 350 q6). ID consulted for meningioencephalitis. They felt CSF c/w viral vs early bacterial process. Recommended continuing amp/CTX and acyclovir. Added HCM screen for syphilis and HIV, also cryptococcal Ag.  8/9 CT angio: negative for large vessel occlusion. Atheromatous change about the carotid bifurcations/proximal ICAs., both vertebral arteries, and t/o intracranial circulation.  Crypto ag neg, CSF culture: neg, Varicella zoster: neg. HSV neg. Culture analysis to date unrevealing. TGs climbing to 738 (started at 189 three days prior. Added versed. Prop capped at 40. Phenobarb added 8/10 Continues to have epileptiform burst. Perampranel added.   Interim History / Subjective:  No clinical seizures.  Objective   Blood pressure (!) 132/44, pulse 68, temperature 99.6 F (37.6 C), temperature source Oral, resp. rate 18, height '5\' 3"'$  (1.6 m), weight 66.1 kg, SpO2 100 %.    Vent Mode: PRVC FiO2 (%):  [40 %] 40 % Set Rate:  [18 bmp] 18 bmp Vt Set:  [420 mL-426 mL] 420 mL PEEP:  [5 cmH20] 5 cmH20 Plateau Pressure:  [15 cmH20-17 cmH20] 15 cmH20   Intake/Output Summary (Last 24 hours) at 11/23/2020 1105 Last data filed at 11/23/2020 0900 Gross per 24 hour  Intake 2204.78 ml  Output 1285  ml  Net 919.78 ml    Filed Weights   11/19/20 0500 11/20/20 0500 11/22/20 0500  Weight: 65.2 kg 65.8 kg 66.1 kg    Examination: General  still sedated. Still on LTM no wheezing HENT: Orally intubated. PERRL. Sclera not icteric Pulm. Equal. Clear to auscultation bilaterally.  Min vent support.  Card rrr + systolic heart murmur  Abd soft not tender. + bowel sounds tol FTs Ext diffuse anasarca. Pulses strong Neuro sedated and on LTM  GU clear yellow w/ FC  cECG personally reviewed and shows increased frequency of epileptiform bursts.   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  - continue current ventilatory support.  - Continue current propofol and versed, wean propofol as tolerated once better seizure control achieved  - correct hypokalemia and start gentle diuresis.  - prognosis guarded if sz control not achieved.    Best Practice (right click and "Reselect all SmartList Selections" daily)   Diet/type: Tube feeds DVT prophylaxis: Enoxaparin GI prophylaxis: PPI Lines: PICC line, needed Foley:  Yes, and it is still needed Code Status:  full code Last date of multidisciplinary goals of care discussion: 8/7 patient's daughter was updated at bedside.  Continue full aggressive care  CRITICAL CARE Performed by: Kipp Brood   Total critical care time: 40 minutes  Critical care time was exclusive of separately billable procedures and treating other patients.  Critical care was necessary to treat or prevent imminent or life-threatening deterioration.  Critical care was time spent personally by me on the following activities: development of treatment plan with patient and/or surrogate  as well as nursing, discussions with consultants, evaluation of patient's response to treatment, examination of patient, obtaining history from patient or surrogate, ordering and performing treatments and interventions, ordering and review of laboratory studies, ordering and review of radiographic studies, pulse oximetry, re-evaluation of patient's condition and participation in multidisciplinary rounds.  Kipp Brood, MD [facility] ICU Physician [employer]  Pager: [phone] Mobile: [phone] After hours: [phone].

## 2020-11-24 ENCOUNTER — Inpatient Hospital Stay (HOSPITAL_COMMUNITY): Payer: Medicare Other

## 2020-11-24 DIAGNOSIS — G40919 Epilepsy, unspecified, intractable, without status epilepticus: Secondary | ICD-10-CM

## 2020-11-24 DIAGNOSIS — R4182 Altered mental status, unspecified: Secondary | ICD-10-CM | POA: Diagnosis not present

## 2020-11-24 DIAGNOSIS — R569 Unspecified convulsions: Secondary | ICD-10-CM

## 2020-11-24 LAB — GLUCOSE, CAPILLARY
Glucose-Capillary: 102 mg/dL — ABNORMAL HIGH (ref 70–99)
Glucose-Capillary: 102 mg/dL — ABNORMAL HIGH (ref 70–99)
Glucose-Capillary: 118 mg/dL — ABNORMAL HIGH (ref 70–99)
Glucose-Capillary: 132 mg/dL — ABNORMAL HIGH (ref 70–99)
Glucose-Capillary: 148 mg/dL — ABNORMAL HIGH (ref 70–99)
Glucose-Capillary: 75 mg/dL (ref 70–99)
Glucose-Capillary: 95 mg/dL (ref 70–99)

## 2020-11-24 LAB — BASIC METABOLIC PANEL WITH GFR
Anion gap: 9 (ref 5–15)
BUN: 12 mg/dL (ref 8–23)
CO2: 21 mmol/L — ABNORMAL LOW (ref 22–32)
Calcium: 7.7 mg/dL — ABNORMAL LOW (ref 8.9–10.3)
Chloride: 118 mmol/L — ABNORMAL HIGH (ref 98–111)
Creatinine, Ser: 0.93 mg/dL (ref 0.44–1.00)
GFR, Estimated: >60 mL/min
Glucose, Bld: 113 mg/dL — ABNORMAL HIGH (ref 70–99)
Potassium: 4.4 mmol/L (ref 3.5–5.1)
Sodium: 148 mmol/L — ABNORMAL HIGH (ref 135–145)

## 2020-11-24 LAB — CBC WITH DIFFERENTIAL/PLATELET
Abs Immature Granulocytes: 0.1 10*3/uL — ABNORMAL HIGH (ref 0.00–0.07)
Basophils Absolute: 0 10*3/uL (ref 0.0–0.1)
Basophils Relative: 0 %
Eosinophils Absolute: 0.2 10*3/uL (ref 0.0–0.5)
Eosinophils Relative: 2 %
HCT: 26 % — ABNORMAL LOW (ref 36.0–46.0)
Hemoglobin: 8.1 g/dL — ABNORMAL LOW (ref 12.0–15.0)
Immature Granulocytes: 1 %
Lymphocytes Relative: 26 %
Lymphs Abs: 2 10*3/uL (ref 0.7–4.0)
MCH: 25.3 pg — ABNORMAL LOW (ref 26.0–34.0)
MCHC: 31.2 g/dL (ref 30.0–36.0)
MCV: 81.3 fL (ref 80.0–100.0)
Monocytes Absolute: 0.8 10*3/uL (ref 0.1–1.0)
Monocytes Relative: 10 %
Neutro Abs: 4.6 10*3/uL (ref 1.7–7.7)
Neutrophils Relative %: 61 %
Platelets: 193 10*3/uL (ref 150–400)
RBC: 3.2 MIL/uL — ABNORMAL LOW (ref 3.87–5.11)
RDW: 17.7 % — ABNORMAL HIGH (ref 11.5–15.5)
WBC: 7.6 10*3/uL (ref 4.0–10.5)
nRBC: 0.8 % — ABNORMAL HIGH (ref 0.0–0.2)

## 2020-11-24 LAB — PHENYTOIN LEVEL, TOTAL: Phenytoin Lvl: 13.8 ug/mL (ref 10.0–20.0)

## 2020-11-24 LAB — ALBUMIN: Albumin: 1.7 g/dL — ABNORMAL LOW (ref 3.5–5.0)

## 2020-11-24 LAB — PHENOBARBITAL LEVEL: Phenobarbital: 8.4 ug/mL — ABNORMAL LOW (ref 15.0–30.0)

## 2020-11-24 LAB — SEDIMENTATION RATE: Sed Rate: 50 mm/hr — ABNORMAL HIGH (ref 0–22)

## 2020-11-24 LAB — VALPROIC ACID LEVEL: Valproic Acid Lvl: 49 ug/mL — ABNORMAL LOW (ref 50.0–100.0)

## 2020-11-24 LAB — TSH: TSH: 0.221 u[IU]/mL — ABNORMAL LOW (ref 0.350–4.500)

## 2020-11-24 MED ORDER — SODIUM CHLORIDE 0.9 % IV SOLN
1000.0000 mg | INTRAVENOUS | Status: AC
Start: 2020-11-24 — End: 2020-11-29
  Administered 2020-11-24 – 2020-11-28 (×5): 1000 mg via INTRAVENOUS
  Filled 2020-11-24 (×5): qty 8

## 2020-11-24 MED ORDER — LIDOCAINE HCL (PF) 1 % IJ SOLN
INTRAMUSCULAR | Status: AC
  Filled 2020-11-24: qty 5

## 2020-11-24 MED ORDER — PERAMPANEL 2 MG PO TABS
8.0000 mg | ORAL_TABLET | Freq: Every day | ORAL | Status: DC
  Administered 2020-11-24 – 2020-11-29 (×6): 8 mg
  Filled 2020-11-24 (×6): qty 4

## 2020-11-24 MED ORDER — IOHEXOL 9 MG/ML PO SOLN
ORAL | Status: AC
  Administered 2020-11-24: 500 mL
  Filled 2020-11-24: qty 1000

## 2020-11-24 MED ORDER — TOPIRAMATE 100 MG PO TABS
200.0000 mg | ORAL_TABLET | Freq: Once | ORAL | Status: AC
  Administered 2020-11-24: 200 mg
  Filled 2020-11-24: qty 2

## 2020-11-24 MED ORDER — MIDAZOLAM BOLUS VIA INFUSION
5.0000 mg | Freq: Once | INTRAVENOUS | Status: AC
  Administered 2020-11-24: 5 mg via INTRAVENOUS

## 2020-11-24 MED ORDER — SODIUM CHLORIDE 0.9 % IV SOLN
2000.0000 mg | Freq: Two times a day (BID) | INTRAVENOUS | Status: DC
  Administered 2020-11-24 – 2020-11-29 (×11): 2000 mg via INTRAVENOUS
  Filled 2020-11-24 (×12): qty 20

## 2020-11-24 MED ORDER — IOHEXOL 350 MG/ML SOLN
100.0000 mL | Freq: Once | INTRAVENOUS | Status: AC | PRN
  Administered 2020-11-24: 100 mL via INTRAVENOUS

## 2020-11-24 NOTE — Progress Notes (Signed)
Pt transported to CT and back to 4N23 without any complications.

## 2020-11-24 NOTE — Progress Notes (Signed)
Subjective: Continues to have highly epileptiform discharges.  ROS: Unable to obtain due to poor mental status  Examination  Vital signs in last 24 hours: Temp:  [97.5 F (36.4 C)-98.7 F (37.1 C)] 98.4 F (36.9 C) (08/11 1200) Pulse Rate:  [58-71] 68 (08/11 1204) Resp:  [0-20] 18 (08/11 1204) BP: (107-145)/(38-78) 134/43 (08/11 1204) SpO2:  [95 %-100 %] 100 % (08/11 1204) FiO2 (%):  [40 %] 40 % (08/11 1204) Weight:  [71.1 kg] 71.1 kg (08/11 0500)    General: lying in bed, NAD CVS: pulse-normal rate and rhythm RS: intubated, symmetric expansion Extremities: Warm, dependent edema Neuro: On propofol '@40mcg'$ /hr and versed '@15mg'$ /hr, comatose, does not open eyes to noxious stimuli, does not follow commands, PERRLA, corneal reflex absent, gag reflex absent, flaccid, does not withdraw to noxious stimuli in all extremities  Basic Metabolic Panel: Recent Labs  Lab 11/19/20 0505 11/19/20 1736 11/20/20 0447 11/20/20 1615 11/20/20 2016 11/21/20 0106 11/21/20 0552 11/23/20 0518 11/24/20 0548  NA 143   < > 142 142  --   --  142 143 148*  K 4.1   < > 3.4* 2.8*  --  3.8 3.5 3.2* 4.4  CL 109   < > 110 110  --   --  111 114* 118*  CO2 21*   < > 23 23  --   --  23 23 21*  GLUCOSE 140*   < > 156* 113*  --   --  111* 127* 113*  BUN 21   < > 13 11  --   --  '8 13 12  '$ CREATININE 1.14*   < > 1.01* 0.90  --   --  0.92 1.02* 0.93  CALCIUM 8.4*   < > 7.9* 7.9*  --   --  7.9* 7.5* 7.7*  MG 2.0  --   --   --  2.0  --   --   --   --   PHOS 2.3*  --   --   --   --   --   --   --   --    < > = values in this interval not displayed.    CBC: Recent Labs  Lab 11/16/2020 1034 11/27/2020 1039 12/06/2020 1132 11/19/20 0459 11/19/20 0505 11/23/20 0518 11/24/20 0548  WBC 17.4*  --   --   --  14.7* 4.5 7.6  NEUTROABS 13.6*  --   --   --   --  2.4 4.6  HGB 12.3   < > 12.2 10.2* 10.7* 8.7* 8.1*  HCT 40.3   < > 36.0 30.0* 32.9* 27.8* 26.0*  MCV 81.7  --   --   --  79.7* 81.0 81.3  PLT 340  --   --   --   239 202 193   < > = values in this interval not displayed.     Coagulation Studies: No results for input(s): LABPROT, INR in the last 72 hours.  Imaging No new brain imaging overnight  ASSESSMENT AND PLAN:  75 y.o. female with PMH significant for prior stroke who presented via EMS with unresponsiveness and seizures. Had back to back seizures with no return to baseline in between and concern for status epilepticus. cEEG with left PELDS and BIRDs. Febrile in the ED and so LP was done with neutrophil predominant pleocytosis and elevated protein and glucose. LP was obtained after a dose of Antibiotics was given.   Status epilepticus ( resolved) Acute encephalopathy, infectious vs  seizure Hypertriglyceridemia ( resolved) - LTM EEG continues to show burst suppression with highly epileptiform discharges -Status epilepticus likely due to underlying infarcts   Recommendations -Continue propofol to 40 MCG per hour, increased Versed to 73m/h.  Versed can be increased if patient has frequent seizures - Start topiramate '200mg'$  once -Continue Depakote 350 mg every 6 hours, Keppra 1500 mg twice daily, Dilantin 100 mg every 8 hours, phenobarb '130mg'$  BID, perampanel '8mg'$   -Continue LTM EEG while on sedation -We will also order CT chest Abdo pelvis with contrast to look for neoplastic disease -We will [facility] encephalopathy/autoimmune/paraneoplastic panel [facility]-Also getting repeat lumbar puncture to send for TB culture per ID -After that we will start patient on IV Solu-Medrol 1000 mg for 5 days -Continue seizure precautions -as needed IV Ativan 2 mg for clinical seizure-like activity - Management of history of comorbidities per primary team - Discussed plan with critical care team, updated both daughters at bedside   CRITICAL CARE Performed by: PLora Havens  Total critical care time: 50 minutes   Critical care time was exclusive of separately billable procedures and  treating other patients.   Critical care was necessary to treat or prevent imminent or life-threatening deterioration.   Critical care was time spent personally by me on the following activities: development of treatment plan with patient and/or surrogate as well as nursing, discussions with consultants, evaluation of patient's response to treatment, examination of patient, obtaining history from patient or surrogate, ordering and performing treatments and interventions, ordering and review of laboratory studies, ordering and review of radiographic studies, pulse oximetry and re-evaluation of patient's condition.    PZeb ComfortEpilepsy [facility] For questions after 5pm please refer to AMION to reach the Neurologist on call

## 2020-11-24 NOTE — Procedures (Addendum)
Patient Name: Margaret Rangel  MRN: [id number]  Epilepsy Attending: Lora Havens  Referring Physician/Provider: Dr Donnetta Simpers Duration: 11/23/2020 1120 to 11/24/2020 1120   Patient history:  75 y.o. female with PMH significant for prior stroke who presented via EMS with unresponsiveness and seizures. Had back to back seizures with no return to baseline in between. EEG due to concern for status epilepticus.    Level of alertness:  comatose   AEDs during EEG study: LEV, VPA, PHT, Perampanel, propofol, versed   Technical aspects: This EEG study was done with scalp electrodes positioned according to the 10-20 International system of electrode placement. Electrical activity was acquired at a sampling rate of '500Hz'$  and reviewed with a high frequency filter of '70Hz'$  and a low frequency filter of '1Hz'$ . EEG data were recorded continuously and digitally stored.   Description: EEG showed burst suppression pattern with bursts of generalized highly epileptiform discharges lasting 1-2 seconds.  There was also generalized EEG suppression lasting 30-40 seconds.    ABNORMALITY -Burst suppression with highly epileptiform discharges, generalized   IMPRESSION: This study showed evidence of epileptogenicity with generalized onset and high potential for seizure recurrence. Additionally, there is profound diffuse encephalopathy, non specific etiology but likely due to seizure and sedation.   Ho Parisi [name]

## 2020-11-24 NOTE — Procedures (Signed)
Lumbar Puncture Procedure Note  Margaret Rangel  [id number]  [birth year]  Date:11/24/20  Time:2:37 PM   Provider Performing:Mattheus Rauls   Procedure: Lumbar Puncture [id number])  Indication(s) Rule out meningitis  Consent Risks of the procedure as well as the alternatives and risks of each were explained to the patient and/or caregiver.  Consent for the procedure was obtained and is signed in the bedside chart  Anesthesia None   Time Out Verified patient identification, verified procedure, site/side was marked, verified correct patient position, special equipment/implants available, medications/allergies/relevant history reviewed, required imaging and test results available.   Sterile Technique Maximal sterile technique including sterile barrier drape, hand hygiene, sterile gown, sterile gloves, mask, hair covering.    Procedure Description Using palpation, approximate location of L3-L4 space identified.   Lidocaine used to anesthetize skin and subcutaneous tissue overlying this area.  A 20g spinal needle was then used to access the subarachnoid space. Opening pressure:Not obtained. Closing pressure:Not obtained. AFB and culture obtained.  Complications/Tolerance None; patient tolerated the procedure well.   EBL Minimal   Specimen(s) CSF  Margaret Hansen, MD Internal Medicine Resident PGY-3 [facility] Pager: [phone] 11/24/2020 2:38 PM

## 2020-11-24 NOTE — Progress Notes (Signed)
LTM maint complete - no skin breakdown under: Fp2 F4 F8  MaintenanceT8 O2 Cz  [facility] monitored, Event button test confirmed by [facility].

## 2020-11-24 NOTE — Progress Notes (Addendum)
NAME:  Margaret Rangel, MRN:  [id number], DOB:  Dec 05, 1945, LOS: 6 ADMISSION DATE:  11/22/2020, CONSULTATION DATE:  8/5 REFERRING MD:  picekring, CHIEF COMPLAINT:  seizure    History of Present Illness:  75 year old female with history as per below.  Per family was in usual state of health until 8/4 when her daughter noted she reported more fatigue than usual.  Otherwise no pertinent history observed.Found by her daughter this morning on 8/5 lying on the floor unresponsive.  She was last seen normal the night before.  When her daughter found her, she was nonverbal, unresponsive, had eye deviation upward.  She initiated CPR at the instructions of 911, and called EMS.  On EMS arrival they witnessed at least 5 generalized seizures.  These lasted 10 to 20 seconds apiece with forced gaze deviation to the right and twitching right upper extremity.  She was treated by EMS with 5 mg of midazolam in route, she was given another 2 mg in the emergency room for ongoing seizure activity, loaded with IV Keppra, intubated for airway protection, and neurology was consulted.  Continuous EEG was initiated, additional pertinent findings within the emergency room: New leukocytosis, low-grade fever, some postintubation hypotension which responded to volume resuscitation.  Critical care asked to admit.  Significant Hospital Events: Including procedures, antibiotic start and stop dates in addition to other pertinent events   8/5 intubated for airway protection after being given several doses of benzodiazapines and loaded w/ keppra. IV vanc,ceftriaxone and acyclovir started. LP planned. LTM initiated. CT head w/ encephalomalacia and multiple prior infacts.  8/6: LP was done yesterday, showing high white count, and neutrophil predominate.  EEG showed continuous seizure 8/7: EEG showed continuous seizure, propofol was uptitrated 8/8: MRI multiple small foci thru left cerebral hemisphere in watershed distribution. Restricted  diffusion left thalamus and hippocampus c/w refractory seizure, chronic microvascular disease. Remote lacunar infacts in left fronta, parietaloccipital, bilateral BG, thalami, pons and R cerebellar hemisphere. Started on ASA and lipitor. CTA of head and neck as well as TTE ordered. LTM EEG showed evidence of epileptogenicity arising from left hemisphere and right frontal region. Additionally, there is profound diffuse encephalopathy, non specific etiology but likely due to seizure and sedation. AEDs adjusted. (Phosphenytoin loaded and dilantin started. Depakote dose increased to 350 q6). ID consulted for meningioencephalitis. They felt CSF c/w viral vs early bacterial process. Recommended continuing amp/CTX and acyclovir. Added HCM screen for syphilis and HIV, also cryptococcal Ag.  8/9 CT angio: negative for large vessel occlusion. Atheromatous change about the carotid bifurcations/proximal ICAs., both vertebral arteries, and t/o intracranial circulation.  Crypto ag neg, CSF culture: neg, Varicella zoster: neg. HSV neg. Culture analysis to date unrevealing. TGs climbing to 738 (started at 189 three days prior. Added versed. Prop capped at 40. Phenobarb added 8/10 Continues to have epileptiform burst. Perampranel added.  8/11 continues to have bursts on LTM, repeat LP  Interim History / Subjective:  LTM showing around 1-3 epileptiform bursts every cycle which decreased after giving of 5 mg Versed bolus and increasing Versed drip to 15 mg/h  Objective   Blood pressure (!) 128/44, pulse 66, temperature 98.6 F (37 C), temperature source Oral, resp. rate 18, height '5\' 3"'$  (1.6 m), weight 71.1 kg, SpO2 100 %.    Vent Mode: PRVC FiO2 (%):  [40 %] 40 % Set Rate:  [18 bmp] 18 bmp Vt Set:  [420 mL] 420 mL PEEP:  [5 cmH20] 5 cmH20 Plateau Pressure:  [15 [id number] cmH20]  15 cmH20   Intake/Output Summary (Last 24 hours) at 11/24/2020 0802 Last data filed at 11/24/2020 0700 Gross per 24 hour  Intake 2753.05 ml   Output 1775 ml  Net 978.05 ml    Filed Weights   11/20/20 0500 11/22/20 0500 11/24/20 0500  Weight: 65.8 kg 66.1 kg 71.1 kg    Examination: General critically ill-appearing African-American female HENT: Orally intubated.  Sclera not icteric Pulm.  Intubated on PRVC.  Bilateral rhonchi Card: rrr + systolic heart murmur. No lower extremity edema. +1 edema in gravity dependent regions Abd: soft not tender Neuro: sedated and on LTM   Resolved Hospital Problem list   Acute kidney injury Sepsis   Assessment & Plan:    Status epilepticus Ddx favors prior stroke but includes acute meningitis/encephalitis. No clinical seizures overnight. No growth to date on CSF cultures. Remains afebrile Management per epileptology -antiepileptics: keppra and valproate -continue burst suppression with propofol and versed.  Versed drip increased to 15 mg an hour -continue empiric meningitis treatment with ampicillin and rocephin -continue to follow CSF culture and quantiferon -Will repeat an LP this afternoon for collection of AFB -As needed Levophed to maintain MAP greater than 65  Acute hypoxic respiratory failure requiring mechanical ventilation secondary sedation required for above Plan -continue full vent support -VAP bundle  Acute microcytic anemia. Hemoglobin is down around 6 grams from admission and continues to slowly decline. -continue to monitor. Transfusion threshold <7  Hypernatremia--148. Continue to monitor.  Type 2 DM. CBGs at goal.  -continue SSI  High risk malnutrition -continue TF  Best Practice (right click and "Reselect all SmartList Selections" daily)   Diet/type: Tube feeds DVT prophylaxis: Enoxaparin GI prophylaxis: PPI Lines: PICC line, needed Foley:  Yes, and it is still needed Code Status:  full code Last date of multidisciplinary goals of care discussion: 8/7 patient's daughter was updated at bedside.  Continue full aggressive care  Mitzi Hansen,  MD Internal Medicine Resident PGY-3 [facility] Pager: [phone] 11/24/2020 8:29 AM

## 2020-11-24 NOTE — Progress Notes (Signed)
[facility]  Date of Admission:  12/12/2020     Reason for Consult: sepsis, seizure, meningoencephalitis                            Referring Provider: Jacky Kindle         Lines:  8/06-c rue picc 8/05-c foley 8/05-c ETT 8/10-c rectal tube   Abx: 8/05-c ceftriaxone 8/05-c ampicillin  8/05-8/09 acyclovir 8/05 vanc                                                             Assessment: 75 y.o. 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 This suggest a viral process but some early bacterial process could be sources as well.   Csf hsv/vzv pcr, serum CrAg negative Csf cx so far negative Hiv/rpr screen negative   There are no other metastatic involvement outside of the cns  Cta head/neck doesn't suggest vasculitic process  ?true sepsis or stroke/seizure related with fever Given uncertainty, will also send out csf pcr which is more sensitive than culture to r/o occult infection  --- 8/11 assessment Ongoing seizure. Team planning on steroid for potential autoimmune/paraneoplastic process  I have discussed with primary team/neurology to get repeat CSF for TB culture as well. Have sent quantiferon gold but this would not be helpful if negative.  As her fever had defervesced and leukocytosis improved without specific tb treatment, will continue to defer it for now. Will see how she respond to steroid   Pending csf pcr  Plan: Continue cns coverage with abx ceftriaxone/ampicillin -- will stop if csf pcr negative Appreciate PCCM/neurology help with lumbar puncture repeat and send for TB culture F/u quantiferon gold 4.   Discussed with primary team/neurology   I spent more than 35 minute reviewing data/chart, and coordinating care  and >50% direct face to face time providing counseling/discussing diagnostics/treatment plan with patient    Active Problems:   Status epilepticus ([place])   Severe sepsis with septic shock ([place])   Meningoencephalitis   Cerebrovascular accident (CVA) ([place])   Altered mental status   Allergies  Allergen Reactions   Codeine Itching   Penicillins Itching    Has patient had a PCN reaction causing immediate rash, facial/tongue/throat swelling, SOB or lightheadedness with hypotension:No Has patient had a PCN reaction causing severe rash involving mucus membranes or skin necrosis:No Has patient had a PCN reaction that required hopititalization:No Has patient had a PCN reaction occurring within the last 10 years:No If all of the above answers are "NO", then may proceed with Cephalosporin use.     Scheduled Meds:  aspirin  81 mg Per Tube Daily   atorvastatin  80 mg Per Tube Daily   chlorhexidine gluconate (MEDLINE KIT)  15 mL Mouth Rinse BID   Chlorhexidine Gluconate Cloth  6 each Topical Daily   enoxaparin (LOVENOX) injection  40 mg Subcutaneous Daily   feeding supplement (PROSource TF)  45 mL Per Tube Daily   insulin aspart  0-9 Units Subcutaneous Q4H  mouth rinse  15 mL Mouth Rinse 10 times per day   pantoprazole sodium  40 mg Per Tube Q1200   perampanel  8 mg Per Tube QHS   PHENObarbital  130 mg Intravenous BID   phenytoin (DILANTIN) IV  100 mg Intravenous Q8H   sodium chloride flush  10-40 mL Intracatheter Q12H   Continuous Infusions:  sodium chloride 10 mL/hr at 11/24/20 0700   ampicillin (OMNIPEN) IV 2 g (11/24/20 0950)   cefTRIAXone (ROCEPHIN)  IV 2 g (11/24/20 1358)   feeding supplement (VITAL AF 1.2 CAL) Stopped (11/24/20 1021)   levETIRAcetam 2,000 mg (11/24/20 1052)   midazolam 15 mg/hr (11/24/20 1038)   norepinephrine (LEVOPHED) Adult infusion 3 mcg/min (11/24/20 0700)   propofol (DIPRIVAN) infusion 40 mcg/kg/min (11/24/20 0949)   valproate sodium 350 mg (11/24/20  1157)   PRN Meds:.sodium chloride, acetaminophen (TYLENOL) oral liquid 160 mg/5 mL, docusate sodium, fentaNYL (SUBLIMAZE) injection, fentaNYL (SUBLIMAZE) injection, gadobutrol, polyethylene glycol, sodium chloride flush   SUBJECTIVE: Rectal tube for loose stool No f/c Leukocytosis improving Remains comatose and with ongoing seizure    PCCM team to do lumbar puncture for fresh csf for tb culture today  Review of Systems: ROS All other ROS was negative, except mentioned above     OBJECTIVE: Vitals:   11/24/20 1230 11/24/20 1245 11/24/20 1300 11/24/20 1315  BP: (!) 129/42 133/66 (!) 127/46 (!) 125/45  Pulse: 66 67 68 65  Resp: 18 18 (!) 6 18  Temp:      TempSrc:      SpO2: 100% 99% 99% 100%  Weight:      Height:       Body mass index is 27.77 kg/m.  Physical Exam General/constitutional:  intubated; ill appearing HEENT: Normocephalic, PER, Conj Clear, EOMI, Oropharynx clear Neck supple CV: rrr no mrg Lungs: clear to auscultation, normal respiratory effort Abd: Soft, Nontender Ext: no edema Skin: No Rash Neuro: nonfocal MSK: no peripheral joint swelling/tenderness/warmth; back spines nontender   Central line presence: yes; site rue without purulence/discharge  Rectal tube brown loose stool    Lab Results Lab Results  Component Value Date   WBC 7.6 11/24/2020   HGB 8.1 (L) 11/24/2020   HCT 26.0 (L) 11/24/2020   MCV 81.3 11/24/2020   PLT 193 11/24/2020    Lab Results  Component Value Date   CREATININE 0.93 11/24/2020   BUN 12 11/24/2020   NA 148 (H) 11/24/2020   K 4.4 11/24/2020   CL 118 (H) 11/24/2020   CO2 21 (L) 11/24/2020    Lab Results  Component Value Date   ALT 31 11/26/2020   AST 97 (H) 11/20/2020   ALKPHOS 45 11/25/2020   BILITOT 1.1 11/28/2020      Microbiology: Recent Results (from the past 240 hour(s))  Resp Panel by RT-PCR (Flu A&B, Covid) Nasopharyngeal Swab     Status: None   Collection Time: 11/22/2020 10:39 AM   Specimen:  Nasopharyngeal Swab; Nasopharyngeal(NP) swabs in vial transport medium  Result Value Ref Range Status   SARS Coronavirus 2 by RT PCR NEGATIVE NEGATIVE Final    Comment: (NOTE) SARS-CoV-2 target nucleic acids are NOT DETECTED.  The SARS-CoV-2 RNA is generally detectable in upper respiratory specimens during the acute phase of infection. The lowest concentration of SARS-CoV-2 viral copies this assay can detect is 138 copies/mL. A negative result does not preclude SARS-Cov-2 infection and should not be used as the sole basis for treatment or other patient management decisions. A negative result  may occur with  improper specimen collection/handling, submission of specimen other than nasopharyngeal swab, presence of viral mutation(s) within the areas targeted by this assay, and inadequate number of viral copies(<138 copies/mL). A negative result must be combined with clinical observations, patient history, and epidemiological information. The expected result is Negative.  Fact Sheet for Patients:  EntrepreneurPulse.com.au  Fact Sheet for Healthcare Providers:  IncredibleEmployment.be  This test is no t yet approved or cleared by the [place] FDA and  has been authorized for detection and/or diagnosis of SARS-CoV-2 by FDA under an Emergency Use Authorization (EUA). This EUA will remain  in effect (meaning this test can be used) for the duration of the COVID-19 declaration under Section 564(b)(1) of the Act, 21 U.S.C.section 360bbb-3(b)(1), unless the authorization is terminated  or revoked sooner.       Influenza A by PCR NEGATIVE NEGATIVE Final   Influenza B by PCR NEGATIVE NEGATIVE Final    Comment: (NOTE) The Xpert Xpress SARS-CoV-2/FLU/RSV plus assay is intended as an aid in the diagnosis of influenza from Nasopharyngeal swab specimens and should not be used as a sole basis for treatment. Nasal washings and aspirates are unacceptable for  Xpert Xpress SARS-CoV-2/FLU/RSV testing.  Fact Sheet for Patients: EntrepreneurPulse.com.au  Fact Sheet for Healthcare Providers: IncredibleEmployment.be  This test is not yet approved or cleared by the [place] FDA and has been authorized for detection and/or diagnosis of SARS-CoV-2 by FDA under an Emergency Use Authorization (EUA). This EUA will remain in effect (meaning this test can be used) for the duration of the COVID-19 declaration under Section 564(b)(1) of the Act, 21 U.S.C. section 360bbb-3(b)(1), unless the authorization is terminated or revoked.  Performed at [facility], [place] [address], [place], [place] [address]   Blood culture (routine x 2)     Status: Abnormal   Collection Time: 11/22/2020 12:34 PM   Specimen: BLOOD  Result Value Ref Range Status   Specimen Description BLOOD LEFT ANTECUBITAL  Final   Special Requests   Final    BOTTLES DRAWN AEROBIC AND ANAEROBIC Blood Culture adequate volume   Culture  Setup Time   Final    GRAM POSITIVE COCCI IN CLUSTERS AEROBIC BOTTLE ONLY CRITICAL RESULT CALLED TO, READ BACK BY AND VERIFIED WITH: V. BRYK,PHARMD 0701 11/19/2020 T. TYSOR    Culture (A)  Final    STAPHYLOCOCCUS HOMINIS THE SIGNIFICANCE OF ISOLATING THIS ORGANISM FROM A SINGLE SET OF BLOOD CULTURES WHEN MULTIPLE SETS ARE DRAWN IS UNCERTAIN. PLEASE NOTIFY THE MICROBIOLOGY DEPARTMENT WITHIN ONE WEEK IF SPECIATION AND SENSITIVITIES ARE REQUIRED. Performed at [facility], [place] [address], [place], [place] [address]    Report Status 11/21/2020 FINAL  Final  Blood Culture ID Panel (Reflexed)     Status: Abnormal   Collection Time: 12/03/2020 12:34 PM  Result Value Ref Range Status   Enterococcus faecalis NOT DETECTED NOT DETECTED Final   Enterococcus Faecium NOT DETECTED NOT DETECTED Final   Listeria monocytogenes NOT DETECTED NOT DETECTED Final   Staphylococcus species DETECTED (A) NOT DETECTED Final    Comment:  CRITICAL RESULT CALLED TO, READ BACK BY AND VERIFIED WITH: V. BRYK,PHARMD 0701 11/19/2020 T. TYSOR    Staphylococcus aureus (BCID) NOT DETECTED NOT DETECTED Final   Staphylococcus epidermidis NOT DETECTED NOT DETECTED Final   Staphylococcus lugdunensis NOT DETECTED NOT DETECTED Final   Streptococcus species NOT DETECTED NOT DETECTED Final   Streptococcus agalactiae NOT DETECTED NOT DETECTED Final   Streptococcus pneumoniae NOT DETECTED  NOT DETECTED Final   Streptococcus pyogenes NOT DETECTED NOT DETECTED Final   A.calcoaceticus-baumannii NOT DETECTED NOT DETECTED Final   Bacteroides fragilis NOT DETECTED NOT DETECTED Final   Enterobacterales NOT DETECTED NOT DETECTED Final   Enterobacter cloacae complex NOT DETECTED NOT DETECTED Final   Escherichia coli NOT DETECTED NOT DETECTED Final   Klebsiella aerogenes NOT DETECTED NOT DETECTED Final   Klebsiella oxytoca NOT DETECTED NOT DETECTED Final   Klebsiella pneumoniae NOT DETECTED NOT DETECTED Final   Proteus species NOT DETECTED NOT DETECTED Final   Salmonella species NOT DETECTED NOT DETECTED Final   Serratia marcescens NOT DETECTED NOT DETECTED Final   Haemophilus influenzae NOT DETECTED NOT DETECTED Final   Neisseria meningitidis NOT DETECTED NOT DETECTED Final   Pseudomonas aeruginosa NOT DETECTED NOT DETECTED Final   Stenotrophomonas maltophilia NOT DETECTED NOT DETECTED Final   Candida albicans NOT DETECTED NOT DETECTED Final   Candida auris NOT DETECTED NOT DETECTED Final   Candida glabrata NOT DETECTED NOT DETECTED Final   Candida krusei NOT DETECTED NOT DETECTED Final   Candida parapsilosis NOT DETECTED NOT DETECTED Final   Candida tropicalis NOT DETECTED NOT DETECTED Final   Cryptococcus neoformans/gattii NOT DETECTED NOT DETECTED Final    Comment: Performed at [facility], [address] [place], [place] [address]  Urine Culture     Status: None   Collection Time: 11/27/2020  1:40 PM   Specimen: Urine, Catheterized   Result Value Ref Range Status   Specimen Description URINE, CATHETERIZED  Final   Special Requests NONE  Final   Culture   Final    NO GROWTH Performed at [facility], [place] [address] [place], [place] [address]    Report Status 11/19/2020 FINAL  Final  VZV PCR, CSF     Status: None   Collection Time: 11/16/2020  5:02 PM   Specimen: CSF; Cerebrospinal Fluid  Result Value Ref Range Status   VZV PCR, CSF Negative Negative Final    Comment: (NOTE) No Varicella Zoster Virus DNA detected. Performed At: [facility] [place], [place] [id number] Rush Farmer MD YB:[id number]   CSF culture w Gram Stain     Status: None   Collection Time: 12/13/2020  5:02 PM   Specimen: CSF  Result Value Ref Range Status   Specimen Description CSF  Final   Special Requests NONE  Final   Gram Stain   Final    WBC PRESENT,BOTH PMN AND MONONUCLEAR NO ORGANISMS SEEN CYTOSPIN SMEAR    Culture   Final    NO GROWTH Performed at [facility], [address] [place], [place] [address]    Report Status 11/22/2020 FINAL  Final  Blood culture (routine x 2)     Status: None   Collection Time: 12/02/2020  6:23 PM   Specimen: BLOOD RIGHT HAND  Result Value Ref Range Status   Specimen Description BLOOD RIGHT HAND  Final   Special Requests   Final    AEROBIC BOTTLE ONLY Blood Culture results may not be optimal due to an inadequate volume of blood received in culture bottles   Culture   Final    NO GROWTH 5 DAYS Performed at [facility], [place] [address], [place], [place] [address]    Report Status 11/23/2020 FINAL  Final  MRSA Next Gen by PCR, Nasal     Status: None   Collection Time: 11/19/20  9:24 AM   Specimen: Nasal Mucosa; Nasal  Swab  Result Value Ref Range Status   MRSA by PCR Next Gen NOT DETECTED NOT DETECTED Final    Comment: (NOTE) The GeneXpert MRSA Assay (FDA approved for NASAL specimens only), is one component of a comprehensive MRSA colonization  surveillance program. It is not intended to diagnose MRSA infection nor to guide or monitor treatment for MRSA infections. Test performance is not FDA approved in patients less than 50 years old. Performed at [facility], [place] [address] [place], [place] [address]   Culture, Respiratory w Gram Stain     Status: None   Collection Time: 11/19/20  9:27 AM   Specimen: Tracheal Aspirate; Respiratory  Result Value Ref Range Status   Specimen Description TRACHEAL ASPIRATE  Final   Special Requests NONE  Final   Gram Stain   Final    ABUNDANT WBC PRESENT, PREDOMINANTLY PMN RARE GRAM POSITIVE COCCI IN PAIRS RARE GRAM VARIABLE ROD    Culture   Final    RARE Normal respiratory flora-no Staph aureus or Pseudomonas seen Performed at [facility], [place] [address], [place],  [address]    Report Status 11/21/2020 FINAL  Final     Serology:   Imaging: If present, new imagings (plain films, ct scans, and mri) have been personally visualized and interpreted; radiology reports have been reviewed. Decision making incorporated into the Impression / Recommendations.  8/05 cxr No abnormality   8/8 mri brain 1. Multiple small foci of restricted diffusion scattered in the left cerebral hemisphere, in a watershed distribution, consistent with acute/subacute infarct. 2. Mildly restricted diffusion involving the left hippocampus and left thalamus is most likely related to seizure activity in the setting of refractory seizures. 3. Moderate chronic microvascular ischemic changes of the white matter. 4. Remote lacunar infarcts in the left frontal lobe, parietooccipital region, bilateral basal ganglia, thalami, pons and right cerebellar hemisphere.    8/9 cta head/neck 1. Negative CTA for large vessel occlusion. 2. Atheromatous change about the carotid bifurcations/proximal ICAs with associated stenoses of up to 40% bilaterally. Additional 50% stenosis about the left CCA. 3.  Atheromatous change at the origins of both vertebral arteries with associated moderate to severe ostial stenoses, right worse than left. 4. Moderate to advanced atheromatous change throughout the intracranial circulation. Associated moderate multifocal stenoses involving the carotid siphons. No other proximal high-grade or correctable stenosis. 5. Irregularity and beading involving the distal cervical ICAs bilaterally, suggesting mild changes of FMD.    Jabier Mutton, [facility] [place] [phone] pager    11/24/2020, 2:43 PM

## 2020-11-24 NOTE — Progress Notes (Signed)
Pharmacy Antibiotic Note  Margaret Rangel is a 75 y.o. female admitted on 11/26/2020 with meningitis.  Pharmacy managing ceftriaxone dosing. Pt is also on ampicillin.   WBC normalized. SCr wnl.   Plan: Continue Ceftriaxone 2 g IV q12h Continue ampicillin 2 gm IV Q 6 hours  Monitor renal function, clinical status, and abx plan. F/u quantiferon gold   Height: '5\' 3"'$  (160 cm) Weight: 71.1 kg (156 lb 12 oz) IBW/kg (Calculated) : 52.4  Temp (24hrs), Avg:98.4 F (36.9 C), Min:97.5 F (36.4 C), Max:99.2 F (37.3 C)  Recent Labs  Lab 11/27/2020 1034 11/27/2020 1039 11/28/2020 1148 11/26/2020 1234 11/23/2020 1829 11/19/20 0505 11/19/20 1736 11/20/20 0447 11/20/20 1615 11/21/20 0552 11/23/20 0518 11/24/20 0548  WBC 17.4*  --   --   --   --  14.7*  --   --   --   --  4.5 7.6  CREATININE 1.53*   < >  --   --    < > 1.14*   < > 1.01* 0.90 0.92 1.02* 0.93  LATICACIDVEN  --   --  3.9* 1.3  --   --   --   --   --   --   --   --    < > = values in this interval not displayed.     Estimated Creatinine Clearance: 48.7 mL/min (by C-G formula based on SCr of 0.93 mg/dL).    Allergies  Allergen Reactions   Codeine Itching   Penicillins Itching    Has patient had a PCN reaction causing immediate rash, facial/tongue/throat swelling, SOB or lightheadedness with hypotension:No Has patient had a PCN reaction causing severe rash involving mucus membranes or skin necrosis:No Has patient had a PCN reaction that required hopititalization:No Has patient had a PCN reaction occurring within the last 10 years:No If all of the above answers are "NO", then may proceed with Cephalosporin use.     Antimicrobials this admission: Vanc 8/5 >> 8/7 CTX 8/5 >> Acyclovir 8/5 >> 8/9 Ampicillin 8/5 >>  Dose adjustments this admission: Monitor renal function for any dose adjustments.  Microbiology results: 8/5 Bcx - 1/4 Staph epi  8/5 UA - neg for UTI 8/5 CSF - NegF  8/6 TA - normal  8/6 MRSA PCR neg, Strep  pneumo Ag neg HSV PCR neg   Thank you for allowing pharmacy to be a part of this patient's care.  Albertina Parr, PharmD., BCPS, BCCCP Clinical Pharmacist Please refer to [facility] for unit-specific pharmacist

## 2020-11-25 DIAGNOSIS — J9600 Acute respiratory failure, unspecified whether with hypoxia or hypercapnia: Secondary | ICD-10-CM

## 2020-11-25 DIAGNOSIS — G40901 Epilepsy, unspecified, not intractable, with status epilepticus: Secondary | ICD-10-CM | POA: Diagnosis not present

## 2020-11-25 LAB — BASIC METABOLIC PANEL
Anion gap: 6 (ref 5–15)
BUN: 14 mg/dL (ref 8–23)
CO2: 19 mmol/L — ABNORMAL LOW (ref 22–32)
Calcium: 7.7 mg/dL — ABNORMAL LOW (ref 8.9–10.3)
Chloride: 117 mmol/L — ABNORMAL HIGH (ref 98–111)
Creatinine, Ser: 0.8 mg/dL (ref 0.44–1.00)
GFR, Estimated: >60 mL/min (ref >60)
Glucose, Bld: 173 mg/dL — ABNORMAL HIGH (ref 70–99)
Potassium: 4.1 mmol/L (ref 3.5–5.1)
Sodium: 142 mmol/L (ref 135–145)

## 2020-11-25 LAB — GLUCOSE, CAPILLARY
Glucose-Capillary: 161 mg/dL — ABNORMAL HIGH (ref 70–99)
Glucose-Capillary: 194 mg/dL — ABNORMAL HIGH (ref 70–99)
Glucose-Capillary: 249 mg/dL — ABNORMAL HIGH (ref 70–99)
Glucose-Capillary: 248 mg/dL — ABNORMAL HIGH (ref 70–99)
Glucose-Capillary: 107 mg/dL — ABNORMAL HIGH (ref 70–99)
Glucose-Capillary: 280 mg/dL — ABNORMAL HIGH (ref 70–99)

## 2020-11-25 LAB — CBC WITH DIFFERENTIAL/PLATELET
Abs Immature Granulocytes: 0.1 10*3/uL — ABNORMAL HIGH (ref 0.00–0.07)
Basophils Absolute: 0 10*3/uL (ref 0.0–0.1)
Basophils Relative: 0 %
Eosinophils Absolute: 0.1 10*3/uL (ref 0.0–0.5)
Eosinophils Relative: 1 %
HCT: 26.7 % — ABNORMAL LOW (ref 36.0–46.0)
Hemoglobin: 8.6 g/dL — ABNORMAL LOW (ref 12.0–15.0)
Immature Granulocytes: 2 %
Lymphocytes Relative: 13 %
Lymphs Abs: 0.7 10*3/uL (ref 0.7–4.0)
MCH: 25.8 pg — ABNORMAL LOW (ref 26.0–34.0)
MCHC: 32.2 g/dL (ref 30.0–36.0)
MCV: 80.2 fL (ref 80.0–100.0)
Monocytes Absolute: 0.3 10*3/uL (ref 0.1–1.0)
Monocytes Relative: 6 %
Neutro Abs: 4.7 10*3/uL (ref 1.7–7.7)
Neutrophils Relative %: 78 %
Platelets: 186 10*3/uL (ref 150–400)
RBC: 3.33 MIL/uL — ABNORMAL LOW (ref 3.87–5.11)
RDW: 18.1 % — ABNORMAL HIGH (ref 11.5–15.5)
WBC: 5.9 10*3/uL (ref 4.0–10.5)
nRBC: 0 % (ref 0.0–0.2)

## 2020-11-25 LAB — ANTI-DNA ANTIBODY, DOUBLE-STRANDED: ds DNA Ab: <1 IU/mL (ref 0–9)

## 2020-11-25 LAB — ANA W/REFLEX IF POSITIVE: Anti Nuclear Antibody (ANA): NEGATIVE

## 2020-11-25 LAB — HIGH SENSITIVITY CRP: CRP, High Sensitivity: 42.86 mg/L — ABNORMAL HIGH (ref 0.00–3.00)

## 2020-11-25 LAB — THYROID PEROXIDASE ANTIBODY: Thyroperoxidase Ab SerPl-aCnc: 8 IU/mL (ref 0–34)

## 2020-11-25 LAB — RHEUMATOID FACTOR: Rheumatoid fact SerPl-aCnc: <10 IU/mL (ref <14.0)

## 2020-11-25 LAB — T4, FREE: Free T4: 0.48 ng/dL — ABNORMAL LOW (ref 0.61–1.12)

## 2020-11-25 MED ORDER — SODIUM CHLORIDE 0.9 % IV SOLN
1.0000 mg/kg/h | INTRAVENOUS | Status: DC
  Administered 2020-11-25 – 2020-11-26 (×4): 1 mg/kg/h via INTRAVENOUS
  Filled 2020-11-25 (×6): qty 5

## 2020-11-25 MED ORDER — TOPIRAMATE 100 MG PO TABS
100.0000 mg | ORAL_TABLET | Freq: Every day | ORAL | Status: DC
  Administered 2020-11-25 – 2020-11-28 (×4): 100 mg
  Filled 2020-11-25 (×4): qty 1

## 2020-11-25 MED ORDER — KETAMINE BOLUS VIA INFUSION
1.5000 mg/kg | Freq: Once | INTRAVENOUS | Status: AC
  Administered 2020-11-25: 117 mg via INTRAVENOUS
  Filled 2020-11-25: qty 120

## 2020-11-25 MED ORDER — PROPOFOL 1000 MG/100ML IV EMUL
30.0000 ug/kg/min | INTRAVENOUS | Status: DC
  Administered 2020-11-26 (×2): 30 ug/kg/min via INTRAVENOUS
  Filled 2020-11-25 (×2): qty 100

## 2020-11-25 NOTE — Progress Notes (Signed)
[facility]  Date of Admission:  11/21/2020     Reason for Consult: sepsis, seizure, meningoencephalitis                            Referring Provider: Jacky Kindle         Lines:  8/06-c rue picc 8/05-c foley 8/05-c ETT 8/10-c rectal tube   Abx: 8/05-c ceftriaxone 8/05-c ampicillin  8/05-8/09 acyclovir 8/05 vanc                                                           Other: 8/11-c solumedrol burst   Assessment: 75 y.o. 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 This suggest a viral process but some early bacterial process could be sources as well.   Csf hsv/vzv pcr, serum CrAg negative Csf cx so far negative Hiv/rpr screen negative   There are no other metastatic involvement outside of the cns  Cta head/neck doesn't suggest vasculitic process  ?true sepsis or stroke/seizure related with fever Given uncertainty, will also send out csf pcr which is more sensitive than culture to r/o occult infection  --- 8/12 assessment Ongoing seizure. Steroid started 8/11 for autoimmune process. Csf panel autoimmune/paraneoplastic sent  Repeat LP 8/11 with csf sent for tb culture 8/11 abd/pelv/chest ct without mass to suggest distal malignancy  Pending csf pcr   Discussed with neurology/primary team, could also consider arborvirus encephalitis. No csf available now to test, but nothing to offer in terms of specific management   Plan: F/u csf pcr (should be available today or tomorrow) Continue ceftriaxone/ampicillin for now F/u autoimmune/paraneoplastic panel and TB csf culture and quantiferon gold Seizure management per neurology Discussed with primary team/neurology  I spent more than 35 minute reviewing data/chart, and  coordinating care and >50% direct face to face time providing counseling/discussing diagnostics/treatment plan with patient    Active Problems:   Status epilepticus ([place])   Severe sepsis with septic shock ([place])   Meningoencephalitis   Cerebrovascular accident (CVA) ([place])   Altered mental status   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     Scheduled Meds:  aspirin  81 mg Per Tube Daily   atorvastatin  80 mg Per Tube Daily   chlorhexidine gluconate (MEDLINE KIT)  15 mL Mouth Rinse BID   Chlorhexidine Gluconate Cloth  6 each Topical Daily   enoxaparin (LOVENOX) injection  40 mg Subcutaneous Daily   feeding supplement (PROSource TF)  45 mL Per Tube Daily   insulin aspart  0-9 Units Subcutaneous Q4H   mouth rinse  15  mL Mouth Rinse 10 times per day   pantoprazole sodium  40 mg Per Tube Q1200   perampanel  8 mg Per Tube QHS   PHENObarbital  130 mg Intravenous BID   phenytoin (DILANTIN) IV  100 mg Intravenous Q8H   sodium chloride flush  10-40 mL Intracatheter Q12H   topiramate  100 mg Per Tube Daily   Continuous Infusions:  sodium chloride Stopped (11/25/20 1151)   ampicillin (OMNIPEN) IV Stopped (11/25/20 1000)   cefTRIAXone (ROCEPHIN)  IV 2 g (11/25/20 0230)   feeding supplement (VITAL AF 1.2 CAL) 1,000 mL (11/25/20 1143)   ketamine (KETALAR) Adult IV Infusion 1 mg/kg/hr (11/25/20 1200)   levETIRAcetam Stopped (11/25/20 0924)   methylPREDNISolone (SOLU-MEDROL) injection Stopped  (11/25/20 0045)   midazolam 15 mg/hr (11/25/20 1200)   norepinephrine (LEVOPHED) Adult infusion 3 mcg/min (11/25/20 1200)   propofol (DIPRIVAN) infusion 40 mcg/kg/min (11/25/20 1200)   valproate sodium 53.5 mL/hr at 11/25/20 1200   PRN Meds:.sodium chloride, acetaminophen (TYLENOL) oral liquid 160 mg/5 mL, docusate sodium, fentaNYL (SUBLIMAZE) injection, fentaNYL (SUBLIMAZE) injection, gadobutrol, polyethylene glycol, sodium chloride flush   SUBJECTIVE: Ongoing seizure Steroid started 8/11 Afebrile; no leukocytosis  New csf obtained for tb cx 8/11 No rash  Review of Systems: ROS All other ROS was negative, except mentioned above     OBJECTIVE: Vitals:   11/25/20 1115 11/25/20 1130 11/25/20 1145 11/25/20 1200  BP: (!) 124/43 (!) 122/42 (!) 131/46 (!) 127/43  Pulse: 61 61 61 61  Resp: _0 Temp:    (!) 97.4 F (36.3 C)  TempSrc:    Axillary  SpO2: 99% 98% 99% 98%  Weight:      Height:       Body mass index is 30.46 kg/m.  Physical Exam General/constitutional: chronically ill appearing; sedated/comatose HEENT: Normocephalic, PER, Conj Clear, Oropharynx ett intact Neck supple CV: rrr no mrg Lungs: clear to auscultation, normal respiratory effort Abd: Soft, Nontender Ext: no edema Skin: No Rash Neuro: deferred MSK: no peripheral joint swelling/tenderness/warmth; back spines nontender    Central line presence: yes; site rue without purulence/discharge  Rectal tube brown loose stool    Lab Results Lab Results  Component Value Date   WBC 5.9 11/25/2020   HGB 8.6 (L) 11/25/2020   HCT 26.7 (L) 11/25/2020   MCV 80.2 11/25/2020   PLT 186 11/25/2020    Lab Results  Component Value Date   CREATININE 0.80 11/25/2020   BUN 14 11/25/2020   NA 142 11/25/2020   K 4.1 11/25/2020   CL 117 (H) 11/25/2020   CO2 19 (L) 11/25/2020    Lab Results  Component Value Date   ALT 31 11/15/2020   AST 97 (H) 11/26/2020   ALKPHOS 45 11/24/2020   BILITOT 1.1  12/04/2020      Microbiology: Recent Results (from the past 240 hour(s))  Resp Panel by RT-PCR (Flu A&B, Covid) Nasopharyngeal Swab     Status: None   Collection Time: 11/24/2020 10:39 AM   Specimen: Nasopharyngeal Swab; Nasopharyngeal(NP) swabs in vial transport medium  Result Value Ref Range Status   SARS Coronavirus 2 by RT PCR NEGATIVE NEGATIVE Final    Comment: (NOTE) SARS-CoV-2 target nucleic acids are NOT DETECTED.  The SARS-CoV-2 RNA is generally detectable in upper respiratory specimens during the acute phase of infection. The lowest concentration of SARS-CoV-2 viral copies this assay can detect is 138 copies/mL. A negative result does not preclude SARS-Cov-2 infection and should not be used as  the sole basis for treatment or other patient management decisions. A negative result may occur with  improper specimen collection/handling, submission of specimen other than nasopharyngeal swab, presence of viral mutation(s) within the areas targeted by this assay, and inadequate number of viral copies(<138 copies/mL). A negative result must be combined with clinical observations, patient history, and epidemiological information. The expected result is Negative.  Fact Sheet for Patients:  EntrepreneurPulse.com.au  Fact Sheet for Healthcare Providers:  IncredibleEmployment.be  This test is no t yet approved or cleared by the [place] FDA and  has been authorized for detection and/or diagnosis of SARS-CoV-2 by FDA under an Emergency Use Authorization (EUA). This EUA will remain  in effect (meaning this test can be used) for the duration of the COVID-19 declaration under Section 564(b)(1) of the Act, 21 U.S.C.section 360bbb-3(b)(1), unless the authorization is terminated  or revoked sooner.       Influenza A by PCR NEGATIVE NEGATIVE Final   Influenza B by PCR NEGATIVE NEGATIVE Final    Comment: (NOTE) The Xpert Xpress SARS-CoV-2/FLU/RSV  plus assay is intended as an aid in the diagnosis of influenza from Nasopharyngeal swab specimens and should not be used as a sole basis for treatment. Nasal washings and aspirates are unacceptable for Xpert Xpress SARS-CoV-2/FLU/RSV testing.  Fact Sheet for Patients: EntrepreneurPulse.com.au  Fact Sheet for Healthcare Providers: IncredibleEmployment.be  This test is not yet approved or cleared by the [place] FDA and has been authorized for detection and/or diagnosis of SARS-CoV-2 by FDA under an Emergency Use Authorization (EUA). This EUA will remain in effect (meaning this test can be used) for the duration of the COVID-19 declaration under Section 564(b)(1) of the Act, 21 U.S.C. section 360bbb-3(b)(1), unless the authorization is terminated or revoked.  Performed at [facility], [place] [address] [place], [place] [address]   Blood culture (routine x 2)     Status: Abnormal   Collection Time: 11/16/2020 12:34 PM   Specimen: BLOOD  Result Value Ref Range Status   Specimen Description BLOOD LEFT ANTECUBITAL  Final   Special Requests   Final    BOTTLES DRAWN AEROBIC AND ANAEROBIC Blood Culture adequate volume   Culture  Setup Time   Final    GRAM POSITIVE COCCI IN CLUSTERS AEROBIC BOTTLE ONLY CRITICAL RESULT CALLED TO, READ BACK BY AND VERIFIED WITH: V. BRYK,PHARMD 0701 11/19/2020 T. TYSOR    Culture (A)  Final    STAPHYLOCOCCUS HOMINIS THE SIGNIFICANCE OF ISOLATING THIS ORGANISM FROM A SINGLE SET OF BLOOD CULTURES WHEN MULTIPLE SETS ARE DRAWN IS UNCERTAIN. PLEASE NOTIFY THE MICROBIOLOGY DEPARTMENT WITHIN ONE WEEK IF SPECIATION AND SENSITIVITIES ARE REQUIRED. Performed at [facility], [place] [address], [place], [place] [address]    Report Status 11/21/2020 FINAL  Final  Blood Culture ID Panel (Reflexed)     Status: Abnormal   Collection Time: 11/27/2020 12:34 PM  Result Value Ref Range Status   Enterococcus faecalis NOT  DETECTED NOT DETECTED Final   Enterococcus Faecium NOT DETECTED NOT DETECTED Final   Listeria monocytogenes NOT DETECTED NOT DETECTED Final   Staphylococcus species DETECTED (A) NOT DETECTED Final    Comment: CRITICAL RESULT CALLED TO, READ BACK BY AND VERIFIED WITH: V. BRYK,PHARMD 0701 11/19/2020 T. TYSOR    Staphylococcus aureus (BCID) NOT DETECTED NOT DETECTED Final   Staphylococcus epidermidis NOT DETECTED NOT DETECTED Final   Staphylococcus lugdunensis NOT DETECTED NOT DETECTED Final   Streptococcus species NOT DETECTED NOT DETECTED Final  Streptococcus agalactiae NOT DETECTED NOT DETECTED Final   Streptococcus pneumoniae NOT DETECTED NOT DETECTED Final   Streptococcus pyogenes NOT DETECTED NOT DETECTED Final   A.calcoaceticus-baumannii NOT DETECTED NOT DETECTED Final   Bacteroides fragilis NOT DETECTED NOT DETECTED Final   Enterobacterales NOT DETECTED NOT DETECTED Final   Enterobacter cloacae complex NOT DETECTED NOT DETECTED Final   Escherichia coli NOT DETECTED NOT DETECTED Final   Klebsiella aerogenes NOT DETECTED NOT DETECTED Final   Klebsiella oxytoca NOT DETECTED NOT DETECTED Final   Klebsiella pneumoniae NOT DETECTED NOT DETECTED Final   Proteus species NOT DETECTED NOT DETECTED Final   Salmonella species NOT DETECTED NOT DETECTED Final   Serratia marcescens NOT DETECTED NOT DETECTED Final   Haemophilus influenzae NOT DETECTED NOT DETECTED Final   Neisseria meningitidis NOT DETECTED NOT DETECTED Final   Pseudomonas aeruginosa NOT DETECTED NOT DETECTED Final   Stenotrophomonas maltophilia NOT DETECTED NOT DETECTED Final   Candida albicans NOT DETECTED NOT DETECTED Final   Candida auris NOT DETECTED NOT DETECTED Final   Candida glabrata NOT DETECTED NOT DETECTED Final   Candida krusei NOT DETECTED NOT DETECTED Final   Candida parapsilosis NOT DETECTED NOT DETECTED Final   Candida tropicalis NOT DETECTED NOT DETECTED Final   Cryptococcus neoformans/gattii NOT DETECTED  NOT DETECTED Final    Comment: Performed at [facility], [address], [place], [place] [address]  Urine Culture     Status: None   Collection Time: 11/26/2020  1:40 PM   Specimen: Urine, Catheterized  Result Value Ref Range Status   Specimen Description URINE, CATHETERIZED  Final   Special Requests NONE  Final   Culture   Final    NO GROWTH Performed at [facility], [place] [address], [place], [place] [address]    Report Status 11/19/2020 FINAL  Final  VZV PCR, CSF     Status: None   Collection Time: 11/25/2020  5:02 PM   Specimen: CSF; Cerebrospinal Fluid  Result Value Ref Range Status   VZV PCR, CSF Negative Negative Final    Comment: (NOTE) No Varicella Zoster Virus DNA detected. Performed At: [facility] [place], [place] [id number] Rush Farmer MD [id number]   CSF culture w Gram Stain     Status: None   Collection Time: 11/22/2020  5:02 PM   Specimen: CSF  Result Value Ref Range Status   Specimen Description CSF  Final   Special Requests NONE  Final   Gram Stain   Final    WBC PRESENT,BOTH PMN AND MONONUCLEAR NO ORGANISMS SEEN CYTOSPIN SMEAR    Culture   Final    NO GROWTH Performed at [facility], [address] [place], [place] [address]    Report Status 11/22/2020 FINAL  Final  Blood culture (routine x 2)     Status: None   Collection Time: 11/25/2020  6:23 PM   Specimen: BLOOD RIGHT HAND  Result Value Ref Range Status   Specimen Description BLOOD RIGHT HAND  Final   Special Requests   Final    AEROBIC BOTTLE ONLY Blood Culture results may not be optimal due to an inadequate volume of blood received in culture bottles   Culture   Final    NO GROWTH 5 DAYS Performed at [facility], [place] [address] [place], [place] [address]    Report Status 11/23/2020 FINAL  Final  MRSA Next Gen by PCR, Nasal     Status: None  Collection Time: 11/19/20  9:24 AM   Specimen: Nasal Mucosa; Nasal Swab  Result Value Ref Range  Status   MRSA by PCR Next Gen NOT DETECTED NOT DETECTED Final    Comment: (NOTE) The GeneXpert MRSA Assay (FDA approved for NASAL specimens only), is one component of a comprehensive MRSA colonization surveillance program. It is not intended to diagnose MRSA infection nor to guide or monitor treatment for MRSA infections. Test performance is not FDA approved in patients less than 7 years old. Performed at [facility], [place] [address] [place], [place] [address]   Culture, Respiratory w Gram Stain     Status: None   Collection Time: 11/19/20  9:27 AM   Specimen: Tracheal Aspirate; Respiratory  Result Value Ref Range Status   Specimen Description TRACHEAL ASPIRATE  Final   Special Requests NONE  Final   Gram Stain   Final    ABUNDANT WBC PRESENT, PREDOMINANTLY PMN RARE GRAM POSITIVE COCCI IN PAIRS RARE GRAM VARIABLE ROD    Culture   Final    RARE Normal respiratory flora-no Staph aureus or Pseudomonas seen Performed at [facility], [place] [address] [place], [place] [address]    Report Status 11/21/2020 FINAL  Final  CSF culture w Stat Gram Stain     Status: None (Preliminary result)   Collection Time: 11/24/20  5:10 PM   Specimen: CSF; Cerebrospinal Fluid  Result Value Ref Range Status   Specimen Description CSF  Final   Special Requests NONE  Final   Gram Stain   Final    WBC PRESENT, PREDOMINANTLY MONONUCLEAR NO ORGANISMS SEEN CYTOSPIN SMEAR    Culture   Final    NO GROWTH < 24 HOURS Performed at [facility], [place] [address] [place], [place] [address]    Report Status PENDING  Incomplete     Serology:   Imaging: If present, new imagings (plain films, ct scans, and mri) have been personally visualized and interpreted; radiology reports have been reviewed. Decision making incorporated into the Impression / Recommendations.  8/05 cxr No abnormality   8/8 mri brain 1. Multiple small foci of restricted diffusion scattered in the left cerebral  hemisphere, in a watershed distribution, consistent with acute/subacute infarct. 2. Mildly restricted diffusion involving the left hippocampus and left thalamus is most likely related to seizure activity in the setting of refractory seizures. 3. Moderate chronic microvascular ischemic changes of the white matter. 4. Remote lacunar infarcts in the left frontal lobe, parietooccipital region, bilateral basal ganglia, thalami, pons and right cerebellar hemisphere.    8/9 cta head/neck 1. Negative CTA for large vessel occlusion. 2. Atheromatous change about the carotid bifurcations/proximal ICAs with associated stenoses of up to 40% bilaterally. Additional 50% stenosis about the left CCA. 3. Atheromatous change at the origins of both vertebral arteries with associated moderate to severe ostial stenoses, right worse than left. 4. Moderate to advanced atheromatous change throughout the intracranial circulation. Associated moderate multifocal stenoses involving the carotid siphons. No other proximal high-grade or correctable stenosis. 5. Irregularity and beading involving the distal cervical ICAs bilaterally, suggesting mild changes of FMD.  8/11 ct abd pelv chest Small bilateral pleural effusions with adjacent subsegmental atelectasis.   Endotracheal tube is in grossly good position. Distal tip of nasogastric tube appears to be in proximal duodenum.   No acute abnormality is noted in the abdomen or pelvis.  Jabier Mutton, [place] [facility] [place] [phone] pager  11/25/2020, 12:24 PM

## 2020-11-25 NOTE — Procedures (Addendum)
Patient Name: Margaret Rangel  MRN: [id number]  Epilepsy Attending: Lora Havens  Referring Physician/Provider: Dr Donnetta Simpers Duration: 11/24/2020 1120 to 11/25/2020 1120   Patient history:  75 y.o. female with PMH significant for prior stroke who presented via EMS with unresponsiveness and seizures. Had back to back seizures with no return to baseline in between. EEG due to concern for status epilepticus.    Level of alertness:  comatose   AEDs during EEG study: LEV, VPA, PHT, Perampanel, topiramate, propofol, versed   Technical aspects: This EEG study was done with scalp electrodes positioned according to the 10-20 International system of electrode placement. Electrical activity was acquired at a sampling rate of '500Hz'$  and reviewed with a high frequency filter of '70Hz'$  and a low frequency filter of '1Hz'$ . EEG data were recorded continuously and digitally stored.   Description: EEG showed burst suppression pattern with bursts of generalized highly epileptiform discharges lasting 1-2 seconds.  There was also generalized EEG suppression with average duration fluctuating between 10 to 15 seconds as well as 50 to 60 seconds.   ABNORMALITY -Burst suppression with highly epileptiform discharges, generalized   IMPRESSION: This study showed evidence of epileptogenicity with generalized onset and high potential for seizure recurrence. Additionally, there is profound diffuse encephalopathy, non specific etiology but likely due to seizure and sedation.   Xyler Terpening [name]

## 2020-11-25 NOTE — Progress Notes (Signed)
Subjective: Continues to have highly epileptiform discharges overnight.  Were started on IV Solu-Medrol yesterday.  ROS: Unable to obtain due to poor mental status  Examination  Vital signs in last 24 hours: Temp:  [94.3 F (34.6 C)-98.6 F (37 C)] 94.3 F (34.6 C) (08/12 0800) Pulse Rate:  [52-68] 61 (08/12 1045) Resp:  [0-19] 0 (08/12 1045) BP: (105-141)/(41-66) 117/44 (08/12 1045) SpO2:  [98 %-100 %] 98 % (08/12 1045) FiO2 (%):  [40 %] 40 % (08/12 0801) Weight:  [78 kg] 78 kg (08/12 0400)  General: lying in bed, NAD CVS: pulse-normal rate and rhythm RS: intubated, symmetric expansion Extremities: Warm, dependent edema Neuro: On propofol '@40mcg'$ /hr and versed '@15mg'$ /hr,ketamine '@1mg'$ /kg/hr comatose, does not open eyes to noxious stimuli, does not follow commands, PERRLA, corneal reflex absent, gag reflex absent, flaccid, does not withdraw to noxious stimuli in all extremities  Basic Metabolic Panel: Recent Labs  Lab 11/19/20 0505 11/19/20 1736 11/20/20 1615 11/20/20 2016 11/21/20 0106 11/21/20 0552 11/23/20 0518 11/24/20 0548 11/25/20 0410  NA 143   < > 142  --   --  142 143 148* 142  K 4.1   < > 2.8*  --  3.8 3.5 3.2* 4.4 4.1  CL 109   < > 110  --   --  111 114* 118* 117*  CO2 21*   < > 23  --   --  23 23 21* 19*  GLUCOSE 140*   < > 113*  --   --  111* 127* 113* 173*  BUN 21   < > 11  --   --  '8 13 12 14  '$ CREATININE 1.14*   < > 0.90  --   --  0.92 1.02* 0.93 0.80  CALCIUM 8.4*   < > 7.9*  --   --  7.9* 7.5* 7.7* 7.7*  MG 2.0  --   --  2.0  --   --   --   --   --   PHOS 2.3*  --   --   --   --   --   --   --   --    < > = values in this interval not displayed.    CBC: Recent Labs  Lab 11/19/20 0459 11/19/20 0505 11/23/20 0518 11/24/20 0548 11/25/20 0410  WBC  --  14.7* 4.5 7.6 5.9  NEUTROABS  --   --  2.4 4.6 4.7  HGB 10.2* 10.7* 8.7* 8.1* 8.6*  HCT 30.0* 32.9* 27.8* 26.0* 26.7*  MCV  --  79.7* 81.0 81.3 80.2  PLT  --  239 202 193 186     Coagulation  Studies: No results for input(s): LABPROT, INR in the last 72 hours.  Imaging No new brain imaging overnight   ASSESSMENT AND PLAN:  75 y.o. female with PMH significant for prior stroke who presented via EMS with unresponsiveness and seizures. Had back to back seizures with no return to baseline in between and concern for status epilepticus. cEEG with left PELDS and BIRDs. Febrile in the ED and so LP was done with neutrophil predominant pleocytosis and elevated protein and glucose. LP was obtained after a dose of Antibiotics was given.   Status epilepticus ( resolved) Acute encephalopathy, infectious vs seizure Hypertriglyceridemia ( resolved) - LTM EEG continues to show burst suppression with highly epileptiform discharges -Status epilepticus likely due to underlying infarcts versus autoimmune versus neoplastic/paraneoplastic -CT chest abdomen pelvis did not show any evidence of primary malignancy  -  sent encephalopathy/autoimmune/paraneoplastic panel and serum and CSF to [facility] on 11/24/2020  Recommendations -Continue propofol to 40 MCG per hour, Versed  39m/h.   -Continues to have highly epileptiform discharges therefore will load with ketamine and start ketamine at 1 mg/kg/hr.  Can increase ketamine for burst suppression -If continues to have highly epileptiform discharges, will discuss with daughter about pentobarbital coma -Continue Depakote 350 mg every 6 hours, Keppra 1500 mg twice daily, Dilantin 100 mg every 8 hours, phenobarb '130mg'$  BID, perampanel '8mg'$ , Depakote 100 mg daily -Continue LTM EEG while on sedation -Also started IV Solu-Medrol 1000 mg daily D2/5 today -Continue seizure precautions -as needed IV Ativan 2 mg for clinical seizure-like activity - Management of history of comorbidities per primary team - Discussed plan with critical care team, updated both daughters at bedside   CRITICAL CARE Performed by: PLora Havens  Total critical care time: 45 minutes    Critical care time was exclusive of separately billable procedures and treating other patients.   Critical care was necessary to treat or prevent imminent or life-threatening deterioration.   Critical care was time spent personally by me on the following activities: development of treatment plan with patient and/or surrogate as well as nursing, discussions with consultants, evaluation of patient's response to treatment, examination of patient, obtaining history from patient or surrogate, ordering and performing treatments and interventions, ordering and review of laboratory studies, ordering and review of radiographic studies, pulse oximetry and re-evaluation of patient's condition.  PZeb ComfortEpilepsy [facility] For questions after 5pm please refer to AMION to reach the Neurologist on call

## 2020-11-25 NOTE — Progress Notes (Signed)
Id brief update    Csf pcr sentout negative    -can stop ceftriaxone/ampicillin -will follow peripherally for the TB csf culture and quantiferon -unlikely ID reason for her presentation

## 2020-11-25 NOTE — Progress Notes (Signed)
LTM maint complete - skin breakdown under:  F4   Maintenance F4 Fp1 Fp2 F8

## 2020-11-25 NOTE — Progress Notes (Signed)
LTM maintenance completed; no skin breakdown was seen. Tested event button.

## 2020-11-25 NOTE — Progress Notes (Addendum)
NAME:  Margaret Rangel, MRN:  [id number], DOB:  06/07/1945, LOS: 7 ADMISSION DATE:  11/21/2020, CONSULTATION DATE:  8/5 REFERRING MD:  picekring, CHIEF COMPLAINT:  seizure    History of Present Illness:  75 year old female with history as per below.  Per family was in usual state of health until 8/4 when her daughter noted she reported more fatigue than usual.  Otherwise no pertinent history observed.Found by her daughter this morning on 8/5 lying on the floor unresponsive.  She was last seen normal the night before.  When her daughter found her, she was nonverbal, unresponsive, had eye deviation upward.  She initiated CPR at the instructions of 911, and called EMS.  On EMS arrival they witnessed at least 5 generalized seizures.  These lasted 10 to 20 seconds apiece with forced gaze deviation to the right and twitching right upper extremity.  She was treated by EMS with 5 mg of midazolam in route, she was given another 2 mg in the emergency room for ongoing seizure activity, loaded with IV Keppra, intubated for airway protection, and neurology was consulted.  Continuous EEG was initiated, additional pertinent findings within the emergency room: New leukocytosis, low-grade fever, some postintubation hypotension which responded to volume resuscitation.  Critical care asked to admit.  Significant Hospital Events: Including procedures, antibiotic start and stop dates in addition to other pertinent events   8/5 intubated for airway protection after being given several doses of benzodiazapines and loaded w/ keppra. IV vanc,ceftriaxone and acyclovir started. LP planned. LTM initiated. CT head w/ encephalomalacia and multiple prior infacts.  8/6: LP was done yesterday, showing high white count, and neutrophil predominate.  EEG showed continuous seizure 8/7: EEG showed continuous seizure, propofol was uptitrated 8/8: MRI multiple small foci thru left cerebral hemisphere in watershed distribution. Restricted  diffusion left thalamus and hippocampus c/w refractory seizure, chronic microvascular disease. Remote lacunar infacts in left fronta, parietaloccipital, bilateral BG, thalami, pons and R cerebellar hemisphere. Started on ASA and lipitor. CTA of head and neck as well as TTE ordered. LTM EEG showed evidence of epileptogenicity arising from left hemisphere and right frontal region. Additionally, there is profound diffuse encephalopathy, non specific etiology but likely due to seizure and sedation. AEDs adjusted. (Phosphenytoin loaded and dilantin started. Depakote dose increased to 350 q6). ID consulted for meningioencephalitis. They felt CSF c/w viral vs early bacterial process. Recommended continuing amp/CTX and acyclovir. Added HCM screen for syphilis and HIV, also cryptococcal Ag.  8/9 CT angio: negative for large vessel occlusion. Atheromatous change about the carotid bifurcations/proximal ICAs., both vertebral arteries, and t/o intracranial circulation.  Crypto ag neg, CSF culture: neg, Varicella zoster: neg. HSV neg. Culture analysis to date unrevealing. TGs climbing to 738 (started at 189 three days prior. Added versed. Prop capped at 40. Phenobarb added 8/10 Continues to have epileptiform burst. Perampranel added.  8/11 continues to have bursts on LTM, repeat LP  8/12 isoelectric on LTM  Interim History / Subjective:  Isoelectric on LTM this morning. No seizures noted overnight. Remains sedated and intubated.  Objective   Blood pressure (!) 114/46, pulse (!) 56, temperature 98.5 F (36.9 C), temperature source Oral, resp. rate 18, height '5\' 3"'$  (1.6 m), weight 78 kg, SpO2 99 %.    Vent Mode: PRVC FiO2 (%):  [40 %] 40 % Set Rate:  [18 bmp] 18 bmp Vt Set:  [420 mL] 420 mL PEEP:  [5 cmH20] 5 cmH20 Plateau Pressure:  [15 cmH20-16 cmH20] 16 cmH20   Intake/Output  Summary (Last 24 hours) at 11/25/2020 [id number] Last data filed at 11/25/2020 0600 Gross per 24 hour  Intake 2939.49 ml  Output 2075 ml   Net 864.49 ml    Filed Weights   11/22/20 0500 11/24/20 0500 11/25/20 0400  Weight: 66.1 kg 71.1 kg 78 kg    Examination: General critically ill-appearing African-American female HENT: Orally intubated.  Tongue is swollen and protruding from her mouth Pulm.  Intubated on PRVC.  Lungs clear bilaterally Card: RRR, trace edema in the gravity dependent regions Abd: soft, not distended Neuro: sedated and on LTM  Skin: no rash or lesion on limited exam  Resolved Hospital Problem list   Acute kidney injury Sepsis   Assessment & Plan:    Status epilepticus Ddx favors prior stroke but includes acute meningitis/encephalitis. (Infectious vs autoimmune) Noted to have epileptiform activity on LTM this morning No growth to date on 8/5 or 8/11 CSF cultures. Remains afebrile and without leukocytosis. Management per epileptology -antiepileptics: keppra and valproate, phenobarb, perampanel -ketamine bolus and infusion per epileptology -continue burst suppression with propofol and versed. -solumedrol '1000mg'$  daily -continue empiric meningitis treatment with ampicillin and rocephin -continue to follow CSF culture, AFB CSF, quantiferon, and autoimmune encephalitis labs -PRN Levophed to maintain MAP greater than 65  Acute hypoxic respiratory failure requiring mechanical ventilation secondary sedation required for above Ventilation day # 8 Plan -continue full vent support -suspect she will need a tracheostomy. Discussed with family at bedside yesterday. If no improvement by Monday, then would plan for tracheostomy. -VAP bundle  Acute microcytic anemia. stable -continue to monitor. Transfusion threshold <7  Type 2 DM. CBGs at goal.  -continue SSI  High risk malnutrition -continue TF -place cortrak  Best Practice (right click and "Reselect all SmartList Selections" daily)   Diet/type: Tube feeds DVT prophylaxis: Enoxaparin GI prophylaxis: PPI Lines: PICC, NG, ETT, Foley Foley:   Yes, and it is still needed Code Status:  full code Family updated 8/11. Discussed tracheostomy Monday if unable to extubate by then. Continue full aggressive care  Mitzi Hansen, MD Internal Medicine Resident PGY-3 [facility] Pager: [phone] 11/25/2020 7:22 AM

## 2020-11-26 DIAGNOSIS — J9601 Acute respiratory failure with hypoxia: Secondary | ICD-10-CM | POA: Diagnosis not present

## 2020-11-26 LAB — CBC WITH DIFFERENTIAL/PLATELET
Abs Immature Granulocytes: 0.27 10*3/uL — ABNORMAL HIGH (ref 0.00–0.07)
Basophils Absolute: 0 10*3/uL (ref 0.0–0.1)
Basophils Relative: 0 %
Eosinophils Absolute: 0 10*3/uL (ref 0.0–0.5)
Eosinophils Relative: 0 %
HCT: 27.4 % — ABNORMAL LOW (ref 36.0–46.0)
Hemoglobin: 8.5 g/dL — ABNORMAL LOW (ref 12.0–15.0)
Immature Granulocytes: 3 %
Lymphocytes Relative: 5 %
Lymphs Abs: 0.5 10*3/uL — ABNORMAL LOW (ref 0.7–4.0)
MCH: 25.2 pg — ABNORMAL LOW (ref 26.0–34.0)
MCHC: 31 g/dL (ref 30.0–36.0)
MCV: 81.3 fL (ref 80.0–100.0)
Monocytes Absolute: 0.3 10*3/uL (ref 0.1–1.0)
Monocytes Relative: 3 %
Neutro Abs: 9.4 10*3/uL — ABNORMAL HIGH (ref 1.7–7.7)
Neutrophils Relative %: 89 %
Platelets: 258 10*3/uL (ref 150–400)
RBC: 3.37 MIL/uL — ABNORMAL LOW (ref 3.87–5.11)
RDW: 18.3 % — ABNORMAL HIGH (ref 11.5–15.5)
WBC: 10.5 10*3/uL (ref 4.0–10.5)
nRBC: 0.7 % — ABNORMAL HIGH (ref 0.0–0.2)

## 2020-11-26 LAB — BASIC METABOLIC PANEL
Anion gap: 8 (ref 5–15)
BUN: 21 mg/dL (ref 8–23)
CO2: 20 mmol/L — ABNORMAL LOW (ref 22–32)
Calcium: 7.7 mg/dL — ABNORMAL LOW (ref 8.9–10.3)
Chloride: 117 mmol/L — ABNORMAL HIGH (ref 98–111)
Creatinine, Ser: 0.9 mg/dL (ref 0.44–1.00)
GFR, Estimated: >60 mL/min (ref >60)
Glucose, Bld: 292 mg/dL — ABNORMAL HIGH (ref 70–99)
Potassium: 3.7 mmol/L (ref 3.5–5.1)
Sodium: 145 mmol/L (ref 135–145)

## 2020-11-26 LAB — GLUCOSE, CAPILLARY
Glucose-Capillary: 243 mg/dL — ABNORMAL HIGH (ref 70–99)
Glucose-Capillary: 255 mg/dL — ABNORMAL HIGH (ref 70–99)
Glucose-Capillary: 274 mg/dL — ABNORMAL HIGH (ref 70–99)
Glucose-Capillary: 279 mg/dL — ABNORMAL HIGH (ref 70–99)
Glucose-Capillary: 281 mg/dL — ABNORMAL HIGH (ref 70–99)
Glucose-Capillary: 309 mg/dL — ABNORMAL HIGH (ref 70–99)

## 2020-11-26 LAB — TRIGLYCERIDES: Triglycerides: 69 mg/dL (ref <150)

## 2020-11-26 MED ORDER — PENTOBARBITAL LOAD VIA INFUSION
5.0000 mg/kg | Freq: Once | INTRAVENOUS | Status: AC
  Administered 2020-11-26: 390 mg via INTRAVENOUS

## 2020-11-26 MED ORDER — PENTOBARBITAL SODIUM 50 MG/ML IJ SOLN
0.5000 mg/kg/h | INTRAVENOUS | Status: DC
  Administered 2020-11-26: 3 mg/kg/h via INTRAVENOUS
  Administered 2020-11-26: 1 mg/kg/h via INTRAVENOUS
  Administered 2020-11-27: 3 mg/kg/h via INTRAVENOUS
  Filled 2020-11-26 (×3): qty 50

## 2020-11-26 NOTE — Progress Notes (Signed)
LTM maintenance completed; skin breakdown (small blister) at Fp1 and Fp2; moved both leads. Tested event button.

## 2020-11-26 NOTE — Progress Notes (Addendum)
NAME:  Margaret Rangel, MRN:  [id number], DOB:  01-Aug-1945, LOS: 84 ADMISSION DATE:  12/11/2020, CONSULTATION DATE:  8/5 REFERRING MD:  picekring, CHIEF COMPLAINT:  seizure    History of Present Illness:  75 year old female with history as per below.  Per family was in usual state of health until 8/4 when her daughter noted she reported more fatigue than usual.  Otherwise no pertinent history observed.Found by her daughter this morning on 8/5 lying on the floor unresponsive.  She was last seen normal the night before.  When her daughter found her, she was nonverbal, unresponsive, had eye deviation upward.  She initiated CPR at the instructions of 911, and called EMS.  On EMS arrival they witnessed at least 5 generalized seizures.  These lasted 10 to 20 seconds apiece with forced gaze deviation to the right and twitching right upper extremity.  She was treated by EMS with 5 mg of midazolam in route, she was given another 2 mg in the emergency room for ongoing seizure activity, loaded with IV Keppra, intubated for airway protection, and neurology was consulted.  Continuous EEG was initiated, additional pertinent findings within the emergency room: New leukocytosis, low-grade fever, some postintubation hypotension which responded to volume resuscitation.  Critical care asked to admit.  Significant Hospital Events: Including procedures, antibiotic start and stop dates in addition to other pertinent events   8/5 intubated for airway protection after being given several doses of benzodiazapines and loaded w/ keppra. IV vanc,ceftriaxone and acyclovir started. LP planned. LTM initiated. CT head w/ encephalomalacia and multiple prior infacts.  8/6: LP was done yesterday, showing high white count, and neutrophil predominate.  EEG showed continuous seizure 8/7: EEG showed continuous seizure, propofol was uptitrated 8/8: MRI multiple small foci thru left cerebral hemisphere in watershed distribution. Restricted  diffusion left thalamus and hippocampus c/w refractory seizure, chronic microvascular disease. Remote lacunar infacts in left fronta, parietaloccipital, bilateral BG, thalami, pons and R cerebellar hemisphere. Started on ASA and lipitor. CTA of head and neck as well as TTE ordered. LTM EEG showed evidence of epileptogenicity arising from left hemisphere and right frontal region. Additionally, there is profound diffuse encephalopathy, non specific etiology but likely due to seizure and sedation. AEDs adjusted. (Phosphenytoin loaded and dilantin started. Depakote dose increased to 350 q6). ID consulted for meningioencephalitis. They felt CSF c/w viral vs early bacterial process. Recommended continuing amp/CTX and acyclovir. Added HCM screen for syphilis and HIV, also cryptococcal Ag.  8/9 CT angio: negative for large vessel occlusion. Atheromatous change about the carotid bifurcations/proximal ICAs., both vertebral arteries, and t/o intracranial circulation.  Crypto ag neg, CSF culture: neg, Varicella zoster: neg. HSV neg. Culture analysis to date unrevealing. TGs climbing to 738 (started at 189 three days prior. Added versed. Prop capped at 40. Phenobarb added 8/10 Continues to have epileptiform burst. Perampranel added.  8/11 continues to have bursts on LTM, repeat LP  8/12 isoelectric on LTM 8/13 recurrent seizures overnight, planning for pentobarbital coma  Interim History / Subjective:  Recurrent seizures overnight associated with hypertension On propofol, Versed, ketamine Norepinephrine 3 I/O +14.7 L  Objective   Blood pressure (!) 147/45, pulse 75, temperature 99 F (37.2 C), temperature source Axillary, resp. rate 20, height '5\' 3"'$  (1.6 m), weight 78 kg, SpO2 95 %.    Vent Mode: PRVC FiO2 (%):  [40 %] 40 % Set Rate:  [18 bmp] 18 bmp Vt Set:  [420 mL] 420 mL PEEP:  [5 cmH20] 5 cmH20 Plateau Pressure:  [  9 cmH20-19 cmH20] 17 cmH20   Intake/Output Summary (Last 24 hours) at 11/26/2020  1024 Last data filed at 11/26/2020 0800 Gross per 24 hour  Intake 2940.91 ml  Output 2000 ml  Net 940.91 ml   Filed Weights   11/22/20 0500 11/24/20 0500 11/25/20 0400  Weight: 66.1 kg 71.1 kg 78 kg    Examination: General intubated, critically ill, ill-appearing, no distress HENT: ET tube in place, protruding tongue, oropharynx moist, some clear secretions Pulm.  Clear bilaterally Card: Regular, no murmur, 1+ lower extremity edema Abd: Nondistended, positive bowel sounds Neuro: Comatose.  Completely unresponsive (on sedating medications) Skin: No rash  Resolved Hospital Problem list   Acute kidney injury Sepsis   Assessment & Plan:    Status epilepticus Ddx favors prior stroke but includes acute meningitis/encephalitis. (Infectious vs autoimmune) Continues to have seizures, epileptiform activity on continuous EEG No growth to date on 8/5 or 8/11 CSF cultures. Remains afebrile and without leukocytosis. -Appreciate neurology assistance in management -Discussed with Dr. Rory Percy 8/13, planning for DC propofol, Versed, ketamine and start pentobarbital -Continue Depakote, Keppra, Dilantin, phenobarbital, perampanel -Continue current EEG monitoring -Continue empiric Solu-Medrol day 3/5 -Seizure precautions -Following repeat CSF cultures, AFB from 8/11 -Norepinephrine to maintain goal MAP > 65 (shock due to sedation)  Acute hypoxic respiratory failure requiring mechanical ventilation secondary sedation required for above Plan -Continue PRVC.  Not in a position for spontaneous breathing or extubation due to mental status, level sedation. -Will need to have discussions with family regarding prognosis, goals for care.  If we are unable to break seizures with pentobarbital coma then prognosis changed significantly and would not recommend tracheostomy, long-term ventilation.  Acute microcytic anemia. stable -follow CBC -transfusion goal Hgb > 7.0  Type 2 DM. CBGs at goal.  -SSI    High risk malnutrition -TF's ordered  Best Practice (right click and "Reselect all SmartList Selections" daily)   Diet/type: Tube feeds DVT prophylaxis: Enoxaparin GI prophylaxis: PPI Lines: PICC, NG, ETT, Foley Foley:  Yes, and it is still needed Code Status:  full code Family updated 8/11. Continue full aggressive care  Independent CC time 31 minutes  Baltazar Apo, MD, PhD 11/26/2020, 10:33 AM [facility] [phone] or if no answer before 7:00PM call [phone] For any issues after 7:00PM please call eLink [phone]

## 2020-11-26 NOTE — Procedures (Addendum)
Patient Name: Margaret Rangel  MRN: [id number]  Epilepsy Attending: Lora Havens  Referring Physician/Provider: Dr Donnetta Simpers Duration: 11/25/2020 1120 to 11/26/2020 1120   Patient history:  75 y.o. female with PMH significant for prior stroke who presented via EMS with unresponsiveness and seizures. Had back to back seizures with no return to baseline in between. EEG due to concern for status epilepticus.    Level of alertness:  comatose   AEDs during EEG study: LEV, VPA, PHT, Perampanel, topiramate, propofol, versed, ketamine   Technical aspects: This EEG study was done with scalp electrodes positioned according to the 10-20 International system of electrode placement. Electrical activity was acquired at a sampling rate of '500Hz'$  and reviewed with a high frequency filter of '70Hz'$  and a low frequency filter of '1Hz'$ . EEG data were recorded continuously and digitally stored.   Description: EEG showed burst suppression pattern with bursts of generalized highly epileptiform discharges lasting 1-2 seconds.  There was also generalized EEG suppression with average duration fluctuating between 30 seconds to 60 seconds.   ABNORMALITY -Burst suppression with highly epileptiform discharges, generalized   IMPRESSION: This study showed evidence of epileptogenicity with generalized onset and high potential for seizure recurrence. Additionally, there is profound diffuse encephalopathy, non specific etiology but likely due to seizure and sedation.   Doug Bucklin [name]

## 2020-11-26 NOTE — Progress Notes (Signed)
Neurology progress note  Subjective:  Overnight EEG with evidence of epileptogenicity with generalized onset and hypertensive seizure recurrence.  Additionally profound diffuse encephalopathy, nonspecific but likely due to seizure and sedation. Was started on IV Solu-Medrol 2d ago.  ROS: Unable to obtain due to poor mental status  Examination  Vital signs in last 24 hours: Temp:  [97.4 F (36.3 C)-98.9 F (37.2 C)] 98.9 F (37.2 C) (08/12 2000) Pulse Rate:  [55-81] 74 (08/13 0800) Resp:  [0-25] 23 (08/13 0800) BP: (117-158)/(32-52) 140/47 (08/13 0800) SpO2:  [91 %-100 %] 96 % (08/13 0800) FiO2 (%):  [40 %] 40 % (08/13 0800)  General: lying in bed, NAD CVS: pulse-normal rate and rhythm RS: intubated, symmetric expansion Extremities: Warm, dependent edema Neuro: On propofol '@30mcg'$ /hr and versed '@15mg'$ /hr,ketamine '@1mg'$ /kg/hr comatose, does not open eyes to noxious stimuli, does not follow commands, PERRLA, corneal reflex absent, gag reflex absent, flaccid, does not withdraw to noxious stimuli in all extremities  Basic Metabolic Panel: Recent Labs  Lab 11/20/20 2016 11/21/20 0106 11/21/20 0552 11/23/20 0518 11/24/20 0548 11/25/20 0410 11/26/20 0500  NA  --   --  142 143 148* 142 145  K  --    < > 3.5 3.2* 4.4 4.1 3.7  CL  --   --  111 114* 118* 117* 117*  CO2  --   --  23 23 21* 19* 20*  GLUCOSE  --   --  111* 127* 113* 173* 292*  BUN  --   --  '8 13 12 14 21  '$ CREATININE  --   --  0.92 1.02* 0.93 0.80 0.90  CALCIUM  --   --  7.9* 7.5* 7.7* 7.7* 7.7*  MG 2.0  --   --   --   --   --   --    < > = values in this interval not displayed.     CBC: Recent Labs  Lab 11/23/20 0518 11/24/20 0548 11/25/20 0410 11/26/20 0500  WBC 4.5 7.6 5.9 10.5  NEUTROABS 2.4 4.6 4.7 9.4*  HGB 8.7* 8.1* 8.6* 8.5*  HCT 27.8* 26.0* 26.7* 27.4*  MCV 81.0 81.3 80.2 81.3  PLT 202 193 186 258    Coagulation Studies: No results for input(s): LABPROT, INR in the last 72 hours.  Imaging No  new brain imaging overnight   ASSESSMENT: 75 y.o. female with PMH significant for prior stroke who presented via EMS with unresponsiveness and seizures. Had back to back seizures with no return to baseline in between and concern for status epilepticus.  cEEG with left PELDS and BIRDs.  Febrile in the ED and so LP was done with neutrophil predominant pleocytosis and elevated protein and glucose. LP was obtained after a dose of Antibiotics was given.   Status epilepticus (resolved) Acute encephalopathy, infectious vs seizure Hypertriglyceridemia (resolved) - LTM EEG continues to show burst suppression with highly epileptiform discharges -Status epilepticus likely due to underlying infarcts versus autoimmune versus neoplastic/paraneoplastic -CT chest abdomen pelvis did not show any evidence of primary malignancy  - sent encephalopathy/autoimmune/paraneoplastic panel and serum and CSF to [facility] on 11/24/2020  Recommendations -Stop Propofol, versed and Ketamine as we are going to pursue pentobarbital -load Pentobar at '5mg'$ /kg with infusion '1mg'$ /kg - ordered -Continue Depakote 350 mg every 6 hours, Keppra 1500 mg twice daily, Dilantin 100 mg every 8 hours, phenobarb '130mg'$  BID, perampanel '8mg'$ , Depakote 100 mg daily -Continue LTM EEG while on sedation -Also started IV Solu-Medrol 1000 mg daily D3/5 today -  Continue seizure precautions -as needed IV Ativan 2 mg for clinical seizure-like activity - Management of history of comorbidities per primary team - Discussed plan with the critical care team Dr. Lamonte Sakai and daughter over the phone.    -- Amie Portland, MD Neurologist [facility] Pager: [phone]   CRITICAL CARE ATTESTATION Performed by: Amie Portland, MD Total critical care time: 49 minutes Critical care time was exclusive of separately billable procedures and treating other patients and/or supervising APPs/Residents/Students Critical care was necessary to treat or  prevent imminent or life-threatening deterioration due to refractory status epilepticus This patient is critically ill and at significant risk for neurological worsening and/or death and care requires constant monitoring. Critical care was time spent personally by me on the following activities: development of treatment plan with patient and/or surrogate as well as nursing, discussions with consultants, evaluation of patient's response to treatment, examination of patient, obtaining history from patient or surrogate, ordering and performing treatments and interventions, ordering and review of laboratory studies, ordering and review of radiographic studies, pulse oximetry, re-evaluation of patient's condition, participation in multidisciplinary rounds and medical decision making of high complexity in the care of this patient.

## 2020-11-27 DIAGNOSIS — J9601 Acute respiratory failure with hypoxia: Secondary | ICD-10-CM | POA: Diagnosis not present

## 2020-11-27 LAB — GLUCOSE, CAPILLARY
Glucose-Capillary: 269 mg/dL — ABNORMAL HIGH (ref 70–99)
Glucose-Capillary: 277 mg/dL — ABNORMAL HIGH (ref 70–99)
Glucose-Capillary: 303 mg/dL — ABNORMAL HIGH (ref 70–99)
Glucose-Capillary: 258 mg/dL — ABNORMAL HIGH (ref 70–99)
Glucose-Capillary: 199 mg/dL — ABNORMAL HIGH (ref 70–99)

## 2020-11-27 LAB — BASIC METABOLIC PANEL
Anion gap: 6 (ref 5–15)
BUN: 24 mg/dL — ABNORMAL HIGH (ref 8–23)
CO2: 21 mmol/L — ABNORMAL LOW (ref 22–32)
Calcium: 7.2 mg/dL — ABNORMAL LOW (ref 8.9–10.3)
Chloride: 121 mmol/L — ABNORMAL HIGH (ref 98–111)
Creatinine, Ser: 0.93 mg/dL (ref 0.44–1.00)
GFR, Estimated: >60 mL/min (ref >60)
Glucose, Bld: 310 mg/dL — ABNORMAL HIGH (ref 70–99)
Potassium: 3.7 mmol/L (ref 3.5–5.1)
Sodium: 148 mmol/L — ABNORMAL HIGH (ref 135–145)

## 2020-11-27 LAB — CBC WITH DIFFERENTIAL/PLATELET
Abs Immature Granulocytes: 0.38 10*3/uL — ABNORMAL HIGH (ref 0.00–0.07)
Basophils Absolute: 0 10*3/uL (ref 0.0–0.1)
Basophils Relative: 0 %
Eosinophils Absolute: 0 10*3/uL (ref 0.0–0.5)
Eosinophils Relative: 0 %
HCT: 26.4 % — ABNORMAL LOW (ref 36.0–46.0)
Hemoglobin: 8.1 g/dL — ABNORMAL LOW (ref 12.0–15.0)
Immature Granulocytes: 5 %
Lymphocytes Relative: 7 %
Lymphs Abs: 0.6 10*3/uL — ABNORMAL LOW (ref 0.7–4.0)
MCH: 25.2 pg — ABNORMAL LOW (ref 26.0–34.0)
MCHC: 30.7 g/dL (ref 30.0–36.0)
MCV: 82 fL (ref 80.0–100.0)
Monocytes Absolute: 0.3 10*3/uL (ref 0.1–1.0)
Monocytes Relative: 4 %
Neutro Abs: 6.5 10*3/uL (ref 1.7–7.7)
Neutrophils Relative %: 84 %
Platelets: 290 10*3/uL (ref 150–400)
RBC: 3.22 MIL/uL — ABNORMAL LOW (ref 3.87–5.11)
RDW: 18.4 % — ABNORMAL HIGH (ref 11.5–15.5)
WBC: 7.8 10*3/uL (ref 4.0–10.5)
nRBC: 1.3 % — ABNORMAL HIGH (ref 0.0–0.2)

## 2020-11-27 LAB — CSF CULTURE W GRAM STAIN: Culture: NO GROWTH

## 2020-11-27 LAB — N-METHYL-D-ASPARTATE RECPT.IGG: N-methyl-D-Aspartate Recpt.IgG: <1:10 {titer}

## 2020-11-27 MED ORDER — FUROSEMIDE 10 MG/ML IJ SOLN
40.0000 mg | Freq: Two times a day (BID) | INTRAMUSCULAR | Status: DC
  Administered 2020-11-27 – 2020-11-29 (×5): 40 mg via INTRAVENOUS
  Filled 2020-11-27 (×5): qty 4

## 2020-11-27 MED ORDER — INSULIN ASPART 100 UNIT/ML IJ SOLN
0.0000 [IU] | INTRAMUSCULAR | Status: DC
  Administered 2020-11-27: 4 [IU] via SUBCUTANEOUS
  Administered 2020-11-27: 11 [IU] via SUBCUTANEOUS
  Administered 2020-11-27: 15 [IU] via SUBCUTANEOUS
  Administered 2020-11-28 (×3): 7 [IU] via SUBCUTANEOUS
  Administered 2020-11-28: 11 [IU] via SUBCUTANEOUS
  Administered 2020-11-28 (×2): 7 [IU] via SUBCUTANEOUS
  Administered 2020-11-28: 4 [IU] via SUBCUTANEOUS
  Administered 2020-11-29 (×3): 7 [IU] via SUBCUTANEOUS
  Administered 2020-11-29: 4 [IU] via SUBCUTANEOUS
  Administered 2020-11-29: 7 [IU] via SUBCUTANEOUS
  Administered 2020-11-30: 4 [IU] via SUBCUTANEOUS
  Administered 2020-11-30: 7 [IU] via SUBCUTANEOUS

## 2020-11-27 NOTE — Progress Notes (Signed)
Neurology progress note  Subjective:  Yesterday started on pentobarbital, no acute changes.   ROS: Unable to obtain due to poor mental status  Examination  Vital signs in last 24 hours: Temp:  [98.3 F (36.8 C)-99.3 F (37.4 C)] 98.7 F (37.1 C) (08/14 0000) Pulse Rate:  [64-75] 67 (08/14 0736) Resp:  [18-23] 18 (08/14 0736) BP: (116-147)/(38-46) 118/42 (08/14 0736) SpO2:  [95 %-99 %] 96 % (08/14 0736) FiO2 (%):  [40 %] 40 % (08/14 0736)  General: lying in bed, NAD CVS: pulse-normal rate and rhythm RS: intubated, symmetric expansion Extremities: Warm, dependent edema Neuro: On vent about 3 mg/kg/h.  Comatose, does not open eyes to noxious stimuli, does not follow commands, PERRLA, corneal reflex absent, gag reflex absent, flaccid, does not withdraw to noxious stimuli in all extremities  Basic Metabolic Panel: Recent Labs  Lab 11/20/20 2016 11/21/20 0106 11/23/20 0518 11/24/20 0548 11/25/20 0410 11/26/20 0500 11/27/20 0500  NA  --    < > 143 148* 142 145 148*  K  --    < > 3.2* 4.4 4.1 3.7 3.7  CL  --    < > 114* 118* 117* 117* 121*  CO2  --    < > 23 21* 19* 20* 21*  GLUCOSE  --    < > 127* 113* 173* 292* 310*  BUN  --    < > '13 12 14 21 '$ 24*  CREATININE  --    < > 1.02* 0.93 0.80 0.90 0.93  CALCIUM  --    < > 7.5* 7.7* 7.7* 7.7* 7.2*  MG 2.0  --   --   --   --   --   --    < > = values in this interval not displayed.     CBC: Recent Labs  Lab 11/23/20 0518 11/24/20 0548 11/25/20 0410 11/26/20 0500 11/27/20 0500  WBC 4.5 7.6 5.9 10.5 7.8  NEUTROABS 2.4 4.6 4.7 9.4* 6.5  HGB 8.7* 8.1* 8.6* 8.5* 8.1*  HCT 27.8* 26.0* 26.7* 27.4* 26.4*  MCV 81.0 81.3 80.2 81.3 82.0  PLT 202 193 186 258 290    Coagulation Studies: No results for input(s): LABPROT, INR in the last 72 hours.  Imaging No new brain imaging overnight   ASSESSMENT: 75 y.o. female with PMH significant for prior stroke who presented via EMS with unresponsiveness and seizures. Had back to  back seizures with no return to baseline in between and concern for status epilepticus.  cEEG with left PELDS and BIRDs.  Febrile in the ED and so LP was done with neutrophil predominant pleocytosis and elevated protein and glucose. LP was obtained after a dose of Antibiotics was given.   Status epilepticus (resolved) Acute encephalopathy, infectious vs seizure Hypertriglyceridemia (resolved) - LTM EEG continued to show burst suppression with highly epileptiform discharges -Status epilepticus likely due to underlying infarcts versus autoimmune versus neoplastic/paraneoplastic -CT chest abdomen pelvis did not show any evidence of primary malignancy  - sent encephalopathy/autoimmune/paraneoplastic panel and serum and CSF to [facility] on 11/24/2020   Started on pentobarbital yesterday 11/26/2020.  Extremely suppressed EEG.  Recommendations -Continue pentobarbital but reduce the dose from 3 mg/kg/h to 1 mg/kg/h for now -We will recheck EEG around noon-if still remains overly suppressed, might need to do a CT head to figure out if there is anything acute going on. -Continue Depakote 350 mg every 6 hours, Keppra 1500 mg twice daily, Dilantin 100 mg every 8 hours, phenobarb '130mg'$  BID, perampanel  $'8mg's$ , Depakote 100 mg daily -Continue LTM EEG while on sedation-EEG read from overnight pending. -Also started IV Solu-Medrol 1000 mg daily for a total of 5 days-day 4 today. -Continue seizure precautions -as needed IV Ativan 2 mg for clinical seizure-like activity - Management of history of comorbidities per primary team - Discussed plan with the critical care team Dr. Lamonte Sakai and Dr. Hortense Ramal.    -- Amie Portland, MD Neurologist [facility] Pager: [phone]   CRITICAL CARE ATTESTATION Performed by: Amie Portland, MD Total critical care time: 33 minutes Critical care time was exclusive of separately billable procedures and treating other patients and/or supervising  APPs/Residents/Students Critical care was necessary to treat or prevent imminent or life-threatening deterioration due to refractory status epilepticus This patient is critically ill and at significant risk for neurological worsening and/or death and care requires constant monitoring. Critical care was time spent personally by me on the following activities: development of treatment plan with patient and/or surrogate as well as nursing, discussions with consultants, evaluation of patient's response to treatment, examination of patient, obtaining history from patient or surrogate, ordering and performing treatments and interventions, ordering and review of laboratory studies, ordering and review of radiographic studies, pulse oximetry, re-evaluation of patient's condition, participation in multidisciplinary rounds and medical decision making of high complexity in the care of this patient.

## 2020-11-27 NOTE — Progress Notes (Signed)
LTM maintenance completed, no new skin breakdown was seen. Tested event button.

## 2020-11-27 NOTE — Procedures (Addendum)
Patient Name: Margaret Rangel  MRN: [id number]  Epilepsy Attending: Lora Havens  Referring Physician/Provider: Dr Donnetta Simpers Duration: 11/26/2020 1120 to 11/27/2020 1120   Patient history:  75 y.o. female with PMH significant for prior stroke who presented via EMS with unresponsiveness and seizures. Had back to back seizures with no return to baseline in between. EEG due to concern for status epilepticus.    Level of alertness:  comatose   AEDs during EEG study: LEV, VPA, PHT, Perampanel, topiramate, pentobarb, ketamine   Technical aspects: This EEG study was done with scalp electrodes positioned according to the 10-20 International system of electrode placement. Electrical activity was acquired at a sampling rate of '500Hz'$  and reviewed with a high frequency filter of '70Hz'$  and a low frequency filter of '1Hz'$ . EEG data were recorded continuously and digitally stored.   Description: EEG initially showed burst suppression pattern with bursts of generalized highly epileptiform discharges lasting 1-2 seconds. Pentobarbital was adjusted and therefore after around 1330 on 11/26/2020, EEG showed continuous generalized EEG suppression.   ABNORMALITY -EEG suppression, generalized   IMPRESSION: This study initially showed evidence of epileptogenicity with generalized onset and high potential for seizure recurrence. Pentobarbital was adjusted and therefore after around 1330 on 11/26/2020, EEG was suggestive of profound diffuse encephalopathy, non specific etiology but likely due to sedation.   [name] [name]

## 2020-11-27 NOTE — Progress Notes (Addendum)
NAME:  Margaret Rangel, MRN:  [id number], DOB:  12-08-1945, LOS: 45 ADMISSION DATE:  12/05/2020, CONSULTATION DATE:  8/5 REFERRING MD:  picekring, CHIEF COMPLAINT:  seizure    History of Present Illness:  75 year old female with history as per below.  Per family was in usual state of health until 8/4 when her daughter noted she reported more fatigue than usual.  Otherwise no pertinent history observed.Found by her daughter this morning on 8/5 lying on the floor unresponsive.  She was last seen normal the night before.  When her daughter found her, she was nonverbal, unresponsive, had eye deviation upward.  She initiated CPR at the instructions of 911, and called EMS.  On EMS arrival they witnessed at least 5 generalized seizures.  These lasted 10 to 20 seconds apiece with forced gaze deviation to the right and twitching right upper extremity.  She was treated by EMS with 5 mg of midazolam in route, she was given another 2 mg in the emergency room for ongoing seizure activity, loaded with IV Keppra, intubated for airway protection, and neurology was consulted.  Continuous EEG was initiated, additional pertinent findings within the emergency room: New leukocytosis, low-grade fever, some postintubation hypotension which responded to volume resuscitation.  Critical care asked to admit.  Significant Hospital Events: Including procedures, antibiotic start and stop dates in addition to other pertinent events   8/5 intubated for airway protection after being given several doses of benzodiazapines and loaded w/ keppra. IV vanc,ceftriaxone and acyclovir started. LP planned. LTM initiated. CT head w/ encephalomalacia and multiple prior infacts.  8/6: LP was done yesterday, showing high white count, and neutrophil predominate.  EEG showed continuous seizure 8/7: EEG showed continuous seizure, propofol was uptitrated 8/8: MRI multiple small foci thru left cerebral hemisphere in watershed distribution. Restricted  diffusion left thalamus and hippocampus c/w refractory seizure, chronic microvascular disease. Remote lacunar infacts in left fronta, parietaloccipital, bilateral BG, thalami, pons and R cerebellar hemisphere. Started on ASA and lipitor. CTA of head and neck as well as TTE ordered. LTM EEG showed evidence of epileptogenicity arising from left hemisphere and right frontal region. Additionally, there is profound diffuse encephalopathy, non specific etiology but likely due to seizure and sedation. AEDs adjusted. (Phosphenytoin loaded and dilantin started. Depakote dose increased to 350 q6). ID consulted for meningioencephalitis. They felt CSF c/w viral vs early bacterial process. Recommended continuing amp/CTX and acyclovir. Added HCM screen for syphilis and HIV, also cryptococcal Ag.  8/9 CT angio: negative for large vessel occlusion. Atheromatous change about the carotid bifurcations/proximal ICAs., both vertebral arteries, and t/o intracranial circulation.  Crypto ag neg, CSF culture: neg, Varicella zoster: neg. HSV neg. Culture analysis to date unrevealing. TGs climbing to 738 (started at 189 three days prior. Added versed. Prop capped at 40. Phenobarb added 8/10 Continues to have epileptiform burst. Perampranel added.  8/11 continues to have bursts on LTM, repeat LP  8/12 isoelectric on LTM 8/13 recurrent seizures overnight, planning for pentobarbital coma  Interim History / Subjective:   Pentobarbital initiated 8/13 I/O+ 15 L total Norepinephrine remains at 3 0.40, PEEP 5  Objective   Blood pressure (!) 118/42, pulse 67, temperature 98.7 F (37.1 C), temperature source Axillary, resp. rate 18, height '5\' 3"'$  (1.6 m), weight 78 kg, SpO2 96 %.    Vent Mode: PRVC FiO2 (%):  [40 %] 40 % Set Rate:  [18 bmp] 18 bmp Vt Set:  [420 mL] 420 mL PEEP:  [5 cmH20] 5 cmH20 Plateau Pressure:  [  16 cmH20-18 cmH20] 16 cmH20   Intake/Output Summary (Last 24 hours) at 11/27/2020 0813 Last data filed at  11/27/2020 0600 Gross per 24 hour  Intake 2834.53 ml  Output 2525 ml  Net 309.53 ml   Filed Weights   11/22/20 0500 11/24/20 0500 11/25/20 0400  Weight: 66.1 kg 71.1 kg 78 kg    Examination: General ill-appearing woman, ventilated, completely unresponsive HENT: ET tube in place, protruding tongue, oropharynx moist Pulm.  Clear bilaterally Card: Regular, distant, no murmur.  Some upper extremity/hand edema, no lower extremity edema Abd: Nondistended with positive bowel sounds Neuro: Comatose, completely unresponsive (pentobarbital) Skin: No rash  Resolved Hospital Problem list   Acute kidney injury Sepsis   Assessment & Plan:    Status epilepticus Ddx favors prior stroke but includes acute meningitis/encephalitis. (Infectious vs autoimmune) Continues to have seizures, epileptiform activity on continuous EEG No growth to date on 8/5 or 8/11 CSF cultures. Remains afebrile and without leukocytosis. -Appreciate neurology assistance in management -Continue pentobarbital per neurology recommendations.  Goal transition to off when seizure suppressed based on continuous EEG -Continue Depakote, Keppra, Dilantin, phenobarbital, perampanel -Continue empiric Solu-Medrol, day 4/5 -Following repeat CSF cultures and AFB, 8/11 -Norepinephrine to mean goal MAP > 65 (shock presumed due to sedation)  Acute hypoxic respiratory failure requiring mechanical ventilation secondary sedation required for above Plan -Continue PRVC.  Mental status precludes SBT or consideration for extubation -We will need to continue to have discussion with family regarding prognosis and goals for care.  They have wanted to be aggressive and have considered long-term ventilation but if we are unable to break seizures with pentobarbital coma then this would change neurological prognosis significantly.  Would argue against prolonged ventilator support under such circumstances.  Total body volume overload -Start Lasix  8/14 and i/o, BMP  Acute microcytic anemia. stable -Following CBC -Transfusion goal Hgb > 7.0  Type 2 DM. CBGs at goal.  -continue SSI    High risk malnutrition -Continue TF  Best Practice (right click and "Reselect all SmartList Selections" daily)   Diet/type: Tube feeds DVT prophylaxis: Enoxaparin GI prophylaxis: PPI Lines: PICC, NG, ETT, Foley Foley:  Yes, and it is still needed Code Status:  full code Family updated 8/11. Continue full aggressive care  Independent CC time 31 minutes  Baltazar Apo, MD, PhD 11/27/2020, 8:13 AM [facility] [phone] or if no answer before 7:00PM call [phone] For any issues after 7:00PM please call eLink [phone]

## 2020-11-28 ENCOUNTER — Inpatient Hospital Stay (HOSPITAL_COMMUNITY): Payer: Medicare Other

## 2020-11-28 DIAGNOSIS — I469 Cardiac arrest, cause unspecified: Secondary | ICD-10-CM | POA: Diagnosis not present

## 2020-11-28 DIAGNOSIS — L8990 Pressure ulcer of unspecified site, unspecified stage: Secondary | ICD-10-CM | POA: Insufficient documentation

## 2020-11-28 DIAGNOSIS — Z7189 Other specified counseling: Secondary | ICD-10-CM

## 2020-11-28 DIAGNOSIS — Z515 Encounter for palliative care: Secondary | ICD-10-CM

## 2020-11-28 DIAGNOSIS — G40911 Epilepsy, unspecified, intractable, with status epilepticus: Principal | ICD-10-CM

## 2020-11-28 DIAGNOSIS — R4182 Altered mental status, unspecified: Secondary | ICD-10-CM | POA: Diagnosis not present

## 2020-11-28 DIAGNOSIS — R579 Shock, unspecified: Secondary | ICD-10-CM | POA: Diagnosis not present

## 2020-11-28 LAB — CBC WITH DIFFERENTIAL/PLATELET
Abs Immature Granulocytes: 0.48 10*3/uL — ABNORMAL HIGH (ref 0.00–0.07)
Basophils Absolute: 0 10*3/uL (ref 0.0–0.1)
Basophils Relative: 0 %
Eosinophils Absolute: 0 10*3/uL (ref 0.0–0.5)
Eosinophils Relative: 0 %
HCT: 28.6 % — ABNORMAL LOW (ref 36.0–46.0)
Hemoglobin: 9 g/dL — ABNORMAL LOW (ref 12.0–15.0)
Immature Granulocytes: 5 %
Lymphocytes Relative: 12 %
Lymphs Abs: 1.1 10*3/uL (ref 0.7–4.0)
MCH: 25.4 pg — ABNORMAL LOW (ref 26.0–34.0)
MCHC: 31.5 g/dL (ref 30.0–36.0)
MCV: 80.6 fL (ref 80.0–100.0)
Monocytes Absolute: 0.8 10*3/uL (ref 0.1–1.0)
Monocytes Relative: 9 %
Neutro Abs: 6.5 10*3/uL (ref 1.7–7.7)
Neutrophils Relative %: 74 %
Platelets: 316 10*3/uL (ref 150–400)
RBC: 3.55 MIL/uL — ABNORMAL LOW (ref 3.87–5.11)
RDW: 18.4 % — ABNORMAL HIGH (ref 11.5–15.5)
WBC: 8.9 10*3/uL (ref 4.0–10.5)
nRBC: 1.7 % — ABNORMAL HIGH (ref 0.0–0.2)

## 2020-11-28 LAB — GLUCOSE, CAPILLARY
Glucose-Capillary: 191 mg/dL — ABNORMAL HIGH (ref 70–99)
Glucose-Capillary: 204 mg/dL — ABNORMAL HIGH (ref 70–99)
Glucose-Capillary: 214 mg/dL — ABNORMAL HIGH (ref 70–99)
Glucose-Capillary: 218 mg/dL — ABNORMAL HIGH (ref 70–99)
Glucose-Capillary: 218 mg/dL — ABNORMAL HIGH (ref 70–99)
Glucose-Capillary: 249 mg/dL — ABNORMAL HIGH (ref 70–99)
Glucose-Capillary: 268 mg/dL — ABNORMAL HIGH (ref 70–99)

## 2020-11-28 LAB — BASIC METABOLIC PANEL
Anion gap: 6 (ref 5–15)
BUN: 35 mg/dL — ABNORMAL HIGH (ref 8–23)
CO2: 27 mmol/L (ref 22–32)
Calcium: 7.3 mg/dL — ABNORMAL LOW (ref 8.9–10.3)
Chloride: 118 mmol/L — ABNORMAL HIGH (ref 98–111)
Creatinine, Ser: 1.07 mg/dL — ABNORMAL HIGH (ref 0.44–1.00)
GFR, Estimated: 54 mL/min — ABNORMAL LOW (ref >60)
Glucose, Bld: 238 mg/dL — ABNORMAL HIGH (ref 70–99)
Potassium: 3.6 mmol/L (ref 3.5–5.1)
Sodium: 151 mmol/L — ABNORMAL HIGH (ref 135–145)

## 2020-11-28 LAB — PHOSPHORUS: Phosphorus: 3.2 mg/dL (ref 2.5–4.6)

## 2020-11-28 LAB — MAGNESIUM: Magnesium: 2.5 mg/dL — ABNORMAL HIGH (ref 1.7–2.4)

## 2020-11-28 MED ORDER — TOPIRAMATE 100 MG PO TABS
200.0000 mg | ORAL_TABLET | Freq: Every day | ORAL | Status: DC
  Administered 2020-11-29: 200 mg
  Filled 2020-11-28: qty 2

## 2020-11-28 MED ORDER — INSULIN GLARGINE-YFGN 100 UNIT/ML ~~LOC~~ SOLN
5.0000 [IU] | Freq: Every day | SUBCUTANEOUS | Status: DC
  Administered 2020-11-28 – 2020-11-29 (×2): 5 [IU] via SUBCUTANEOUS
  Filled 2020-11-28 (×3): qty 0.05

## 2020-11-28 MED ORDER — POLYETHYLENE GLYCOL 3350 17 G PO PACK: 17.0000 g | PACK | Freq: Every day | ORAL | Status: DC | PRN

## 2020-11-28 MED ORDER — ACETAMINOPHEN 160 MG/5ML PO SOLN
650.0000 mg | Freq: Four times a day (QID) | ORAL | Status: DC | PRN
  Administered 2020-11-29 (×2): 650 mg
  Filled 2020-11-28 (×2): qty 20.3

## 2020-11-28 MED ORDER — PHENOBARBITAL SODIUM 130 MG/ML IJ SOLN
130.0000 mg | Freq: Two times a day (BID) | INTRAMUSCULAR | Status: DC
  Administered 2020-11-28 – 2020-11-29 (×3): 130 mg via INTRAVENOUS
  Filled 2020-11-28 (×3): qty 1

## 2020-11-28 MED ORDER — FREE WATER
100.0000 mL | Freq: Three times a day (TID) | Status: DC
  Administered 2020-11-28 – 2020-11-29 (×3): 100 mL

## 2020-11-28 MED ORDER — DOCUSATE SODIUM 50 MG/5ML PO LIQD: 100.0000 mg | Freq: Two times a day (BID) | ORAL | Status: DC | PRN

## 2020-11-28 NOTE — Progress Notes (Signed)
  [facility]    Date of Admission:  12/06/2020   Total days of antibiotics 8/off all abtx x 48hrs           ID: Margaret Rangel is a 75 y.o. female with7refractory status epilepticus requiring mechanical ventilation for airway protection.  Has been weaned off all sedative infusions.   Active Problems:   Status epilepticus (HCC)   Severe sepsis with septic shock (HCC)   Meningoencephalitis   Cerebrovascular accident (CVA) (HCC)   Altered mental status   Intractable seizures (HCC)   Acute respiratory failure (HCC)   Pressure injury of skin    Subjective: Had isolated temp of 103F last night, but no concurrent leukocytosis. Still remains unresponsive, intubated, off of sedation, on continuous monitoring.  Medications:   aspirin  81 mg Per Tube Daily   atorvastatin  80 mg Per Tube Daily   chlorhexidine gluconate (MEDLINE KIT)  15 mL Mouth Rinse BID   Chlorhexidine Gluconate Cloth  6 each Topical Daily   enoxaparin (LOVENOX) injection  40 mg Subcutaneous Daily   feeding supplement (PROSource TF)  45 mL Per Tube Daily   free water  100 mL Per Tube Q8H   furosemide  40 mg Intravenous BID   insulin aspart  0-20 Units Subcutaneous Q4H   insulin glargine-yfgn  5 Units Subcutaneous QHS   mouth rinse  15 mL Mouth Rinse 10 times per day   pantoprazole sodium  40 mg Per Tube Q1200   perampanel  8 mg Per Tube QHS   PHENObarbital  130 mg Intravenous BID   phenytoin (DILANTIN) IV  100 mg Intravenous Q8H   sodium chloride flush  10-40 mL Intracatheter Q12H   [START ON 11/29/2020] topiramate  200 mg Per Tube Daily    Objective: Vital signs in last 24 hours: Temp:  [95.9 F (35.5 C)-103 F (39.4 C)] 96 F (35.6 C) (08/15 1500) Pulse Rate:  [62-87] 68 (08/15 1607) Resp:  [18-41] 21 (08/15 1607) BP: (86-156)/(39-51) 117/40 (08/15 1607) SpO2:  [89 %-99 %] 97 % (08/15 1607) FiO2 (%):  [40 %-50 %] 50 % (08/15 1607) Weight:  [75 kg] 75 kg (08/15  0500)  Physical Exam  Constitutional:  intubated/unresponsive. appears well-developed and well-nourished. No distress.  HENT: [place]/AT, PERRLA, no scleral icterus. EEG leads in place Mouth/Throat: OETT in place, dry tongue Cardiovascular: Normal rate, regular rhythm and normal heart sounds. Exam reveals no gallop and no friction rub.  No murmur heard.  Pulmonary/Chest: Effort normal and breath sounds normal. No respiratory distress.  has no wheezes.  Neck = supple, no nuchal rigidity Abdominal: Soft. Bowel sounds are normal.  exhibits no distension. There is no tenderness.  Skin: Skin is warm and dry. No rash noted. No erythema.    Lab Results Recent Labs    11/27/20 0500 11/28/20 0505  WBC 7.8 8.9  HGB 8.1* 9.0*  HCT 26.4* 28.6*  NA 148* 151*  K 3.7 3.6  CL 121* 118*  CO2 21* 27  BUN 24* 35*  CREATININE 0.93 1.07*   Liver Panel No results for input(s): PROT, ALBUMIN, AST, ALT, ALKPHOS, BILITOT, BILIDIR, IBILI in the last 72 hours. Sedimentation Rate No results for input(s): ESRSEDRATE in the last 72 hours. C-Reactive Protein No results for input(s): CRP in the last 72 hours.  Microbiology: reviewed Studies/Results: CT HEAD WO CONTRAST (5MM)  Result Date: 11/28/2020 CLINICAL DATA:  Stroke, follow-up, refractory seizures EXAM: CT HEAD WITHOUT CONTRAST TECHNIQUE: Contiguous axial   images were obtained from the base of the skull through the vertex without intravenous contrast. COMPARISON:  Brain MRI 11/21/2020, CT head 11/15/2020, CT a head/neck 11/22/2020 FINDINGS: Brain: There are areas of encephalomalacia in the left frontal and occipital lobes consistent with remote infarcts. Small remote infarcts in the bilateral basal ganglia and left thalamus are unchanged. The smaller acute to subacute seen in the left cerebral hemisphere on the prior MRI are not well seen on the current study. There is no new territorial infarct. There is no evidence of acute intracranial hemorrhage or  extra-axial fluid collection. The ventricles are stable in size, with unchanged ex vacuo dilatation of the left frontal horn. No mass lesion is identified. There is no midline shift. Vascular: There is calcification of the bilateral cavernous ICAs. Skull: Normal. Negative for fracture or focal lesion. Sinuses/Orbits: The imaged paranasal sinuses are clear. The mastoid air cells are clear. The imaged globes and orbits are unremarkable. Other: None. IMPRESSION: 1. The small acute to subacute infarcts in the left cerebral hemisphere on the prior MRI are not well seen on the current study. No new large territorial infarct or acute intracranial hemorrhage on the current study. 2. Remote infarcts in the left cerebral hemisphere as above, unchanged. Electronically Signed   By: Valetta Mole M.D.   On: 11/28/2020 12:13   DG Chest Port 1 View  Result Date: 11/28/2020 CLINICAL DATA:  Intubation, acute respiratory failure with hypoxia, history diabetes mellitus, hypertension, stroke EXAM: PORTABLE CHEST 1 VIEW COMPARISON:  Portable exam 0527 hours compared to 11/19/2020 FINDINGS: Tip of endotracheal tube projects 4.8 cm above carina. Nasogastric tube extends into stomach. RIGHT arm PICC line tip projects over SVC. Normal heart size, mediastinal contours, and pulmonary vascularity. Atelectasis versus consolidation LEFT lower lobe with small associated LEFT pleural effusion. Small sub pulmonic perfusion on RIGHT. Upper lungs clear. No pneumothorax. Atherosclerotic calcifications aorta. IMPRESSION: Atelectasis versus consolidation LEFT lower lobe with small bibasilar pleural effusions. Aortic Atherosclerosis (ICD10-I70.0). Electronically Signed   By: Lavonia Dana M.D.   On: 11/28/2020 08:22   DG Abd Portable 1V  Result Date: 11/28/2020 CLINICAL DATA:  Feeding tube placement EXAM: PORTABLE ABDOMEN - 1 VIEW COMPARISON:  KUB 01/23/2021 FINDINGS: There is an enteric catheter in place with the tip projecting over the expected  location of the pylorus/first portion of the duodenum. There is a nonobstructive bowel gas pattern. There is no gross organomegaly or abnormal soft tissue calcification. There is no acute osseous abnormality. There is calcified atherosclerotic plaque of the aortic arch. IMPRESSION: Enteric catheter tip terminates over the expected location of the pylorus/first portion of the duodenum. Electronically Signed   By: Valetta Mole M.D.   On: 11/28/2020 11:37     Assessment/Plan: Status epilepticus, with neutrophilic predominant pleocytosis on lumbar puncture = cultures NGTD. Received 8 days of IV abtx, which were stopped roughly 48hrs to be monitored off of therapy. Also no new culture results. Will need to call [facility] to see if any + studies for CSF mutliplex PCR assay sent off, but otherwise would not recommend restarting abtx.  Isolated fever= can be seen with anoxic brain injury. Will continue to monitor.  Overall, would continue supportive care and anti-epileptics being adjusted by neurology  Will sign off.   [facility] Cell: [phone] Pager: [phone]  11/28/2020, 5:34 PM

## 2020-11-28 NOTE — Progress Notes (Addendum)
Subjective: No seizures overnight.  ROS: Unable to obtain due to poor mental status  Examination  Vital signs in last 24 hours: Temp:  [95.9 F (35.5 C)-103 F (39.4 C)] 95.9 F (35.5 C) (08/15 1115) Pulse Rate:  [62-87] 67 (08/15 1115) Resp:  [18-41] 18 (08/15 1115) BP: (112-156)/(39-51) 125/49 (08/15 1000) SpO2:  [89 %-98 %] 92 % (08/15 1115) FiO2 (%):  [40 %-50 %] 50 % (08/15 1115) Weight:  [75 kg] 75 kg (08/15 0500)  General: lying in bed, NAD CVS: pulse-normal rate and rhythm RS: intubated, symmetric expansion Extremities: Warm, dependent edema Neuro: comatose, does not open eyes to noxious stimuli, does not follow commands, PERRLA, corneal reflex present, gag reflex absent, flaccid, does not withdraw to noxious stimuli in all extremities  Basic Metabolic Panel: Recent Labs  Lab 11/24/20 0548 11/25/20 0410 11/26/20 0500 11/27/20 0500 11/28/20 0505  NA 148* 142 145 148* 151*  K 4.4 4.1 3.7 3.7 3.6  CL 118* 117* 117* 121* 118*  CO2 21* 19* 20* 21* 27  GLUCOSE 113* 173* 292* 310* 238*  BUN '12 14 21 '$ 24* 35*  CREATININE 0.93 0.80 0.90 0.93 1.07*  CALCIUM 7.7* 7.7* 7.7* 7.2* 7.3*  MG  --   --   --   --  2.5*  PHOS  --   --   --   --  3.2    CBC: Recent Labs  Lab 11/24/20 0548 11/25/20 0410 11/26/20 0500 11/27/20 0500 11/28/20 0505  WBC 7.6 5.9 10.5 7.8 8.9  NEUTROABS 4.6 4.7 9.4* 6.5 6.5  HGB 8.1* 8.6* 8.5* 8.1* 9.0*  HCT 26.0* 26.7* 27.4* 26.4* 28.6*  MCV 81.3 80.2 81.3 82.0 80.6  PLT 193 186 258 290 316     Coagulation Studies: No results for input(s): LABPROT, INR in the last 72 hours.  Imaging CT head without contrast 11/28/2020: The small acute to subacute infarcts in the left cerebral hemisphere on the prior MRI are not well seen on the current study. No new large territorial infarct or acute intracranial hemorrhage on the current study. Remote infarcts in the left cerebral hemisphere as above,unchanged..  ASSESSMENT AND PLAN:  75 y.o. female  with PMH significant for prior stroke who presented via EMS with unresponsiveness and seizures. Had back to back seizures with no return to baseline in between and concern for status epilepticus. cEEG with left PELDS and BIRDs. Febrile in the ED and so LP was done with neutrophil predominant pleocytosis and elevated protein and glucose. LP was obtained after a dose of Antibiotics was given.    New onset refractory status epilepticus ( [place]SE), improving Acute encephalopathy, infectious vs seizure Hypernatremia AKI - LTM EEG shows burst suppression pattern -Status epilepticus likely due to underlying infarcts versus autoimmune versus paraneoplastic - Sent encephalopathy/autoimmune/paraneoplastic panel and serum and CSF to [facility], report is pending   Recommendations -Stop pentobarbital today -Increase topiramate to 200 mg twice daily -Continue Depakote 350 mg every 6 hours, Keppra 2000 mg twice daily, Dilantin 100 mg every 8 hours, phenobarb '130mg'$  BID, perampanel '8mg'$  -Continue LTM EEG while on sedation -Last day of IV Solu-Medrol 1000 mg today -Will consider IVIG if epileptiform bursts return -Continue seizure precautions -As needed IV Ativan 2 mg for clinical seizure-like activity - Management of rest of comorbidities per primary team    CRITICAL CARE Performed by: Lora Havens   Total critical care time: 22mnutes   Critical care time was exclusive of separately billable procedures and treating other patients.  Critical care was necessary to treat or prevent imminent or life-threatening deterioration.   Critical care was time spent personally by me on the following activities: development of treatment plan with patient and/or surrogate as well as nursing, discussions with consultants, evaluation of patient's response to treatment, examination of patient, obtaining history from patient or surrogate, ordering and performing treatments and interventions, ordering and review of  laboratory studies, ordering and review of radiographic studies, pulse oximetry and re-evaluation of patient's condition.    Zeb Comfort Epilepsy [facility] For questions after 5pm please refer to AMION to reach the Neurologist on call

## 2020-11-28 NOTE — Progress Notes (Signed)
RT transported patient from 4N23 to CT and back with RN. No complications and vital signs stable. RT will continue to monitor.

## 2020-11-28 NOTE — Procedures (Addendum)
Patient Name: Margaret Rangel  MRN: [id number]  Epilepsy Attending: Lora Havens  Referring Physician/Provider: Dr Donnetta Simpers Duration: 11/27/2020 1120 to 11/28/2020 1120   Patient history:  75 y.o. female with PMH significant for prior stroke who presented via EMS with unresponsiveness and seizures. Had back to back seizures with no return to baseline in between. EEG due to concern for status epilepticus.    Level of alertness:  comatose   AEDs during EEG study: LEV, VPA, PHT, Perampanel, topiramate, pentobarb, ketamine   Technical aspects: This EEG study was done with scalp electrodes positioned according to the 10-20 International system of electrode placement. Electrical activity was acquired at a sampling rate of '500Hz'$  and reviewed with a high frequency filter of '70Hz'$  and a low frequency filter of '1Hz'$ . EEG data were recorded continuously and digitally stored.   Description: EEG initially showed continuous generalized EEG suppression.  EEG was not reactive to tactile stimulation.  As pentobarb was weaned, after around 1 AM on 11/28/2020, EEG showed brief 1 to 2 seconds of generalized bursts every 30 to 60 seconds. Gradually, as pentobarb was stopped on 11/28/2020, bursts happened every 10 seconds.    ABNORMALITY - EEG suppression, generalized - Burst suppression, generalized   IMPRESSION: This study is suggestive of profound diffuse encephalopathy, non specific etiology but likely due to sedation.  No seizures or epileptiform discharges were seen during this time.   Margaret Rangel [name]

## 2020-11-28 NOTE — Progress Notes (Signed)
LTM maint complete - no skin breakdown under: Fp1 F3 F7 A1 [facility] monitored, Event button test confirmed by [facility].

## 2020-11-28 NOTE — Progress Notes (Signed)
LTM maintenance completed; no new skin breakdown was seen. Event button tested.

## 2020-11-28 NOTE — Progress Notes (Signed)
Critical care attending attestation note:  Patient seen and examined and relevant ancillary tests reviewed.  I agree with the assessment and plan of care as outlined by L Gleason, PAC.   Synopsis of assessment and plan:  75 year old woman who remains critically ill due to super refractory status epilepticus requiring mechanical ventilation for airway protection.  Has been weaned off all sedative infusions.  Continuous EEG reviewed and continues to show burst suppression pattern but with less generalization during the bursts.  On examination she remains unresponsive.   Will keep off sedation and only adjust therapy for clinical seizures. Palliative Care consultation to help with with goals of care conversation.  If the patient does not seize further, she does have a chance of recovering reasonable neurological function over the course of the next weeks to months however recurrent clinical seizures were portend a very poor prognosis given the extent of current therapy.  CRITICAL CARE Performed by: Kipp Brood   Total critical care time: 35 minutes  Critical care time was exclusive of separately billable procedures and treating other patients.  Critical care was necessary to treat or prevent imminent or life-threatening deterioration.  Critical care was time spent personally by me on the following activities: development of treatment plan with patient and/or surrogate as well as nursing, discussions with consultants, evaluation of patient's response to treatment, examination of patient, obtaining history from patient or surrogate, ordering and performing treatments and interventions, ordering and review of laboratory studies, ordering and review of radiographic studies, pulse oximetry, re-evaluation of patient's condition and participation in multidisciplinary rounds.  Kipp Brood, MD [facility] ICU Physician [place]  Pager: [phone] Mobile: [phone] After hours:  [phone].  11/28/2020, 3:04 PM

## 2020-11-28 NOTE — Progress Notes (Signed)
NAME:  Margaret Rangel, MRN:  [id number], DOB:  1946/03/15, LOS: 39 ADMISSION DATE:  12/02/2020, CONSULTATION DATE:  8/5 REFERRING MD:  picekring, CHIEF COMPLAINT:  seizure    History of Present Illness:  75 year old female with history as per below.  Per family was in usual state of health until 8/4 when her daughter noted she reported more fatigue than usual.  Otherwise no pertinent history observed.Found by her daughter this morning on 8/5 lying on the floor unresponsive.  She was last seen normal the night before.  When her daughter found her, she was nonverbal, unresponsive, had eye deviation upward.  She initiated CPR at the instructions of 911, and called EMS.  On EMS arrival they witnessed at least 5 generalized seizures.  These lasted 10 to 20 seconds apiece with forced gaze deviation to the right and twitching right upper extremity.  She was treated by EMS with 5 mg of midazolam in route, she was given another 2 mg in the emergency room for ongoing seizure activity, loaded with IV Keppra, intubated for airway protection, and neurology was consulted.  Continuous EEG was initiated, additional pertinent findings within the emergency room: New leukocytosis, low-grade fever, some postintubation hypotension which responded to volume resuscitation.  Critical care asked to admit.  Significant Hospital Events: Including procedures, antibiotic start and stop dates in addition to other pertinent events   8/5 intubated for airway protection after being given several doses of benzodiazapines and loaded w/ keppra. IV vanc,ceftriaxone and acyclovir started. LP planned. LTM initiated. CT head w/ encephalomalacia and multiple prior infacts.  8/6: LP was done yesterday, showing high white count, and neutrophil predominate.  EEG showed continuous seizure 8/7: EEG showed continuous seizure, propofol was uptitrated 8/8: MRI multiple small foci thru left cerebral hemisphere in watershed distribution. Restricted  diffusion left thalamus and hippocampus c/w refractory seizure, chronic microvascular disease. Remote lacunar infacts in left fronta, parietaloccipital, bilateral BG, thalami, pons and R cerebellar hemisphere. Started on ASA and lipitor. CTA of head and neck as well as TTE ordered. LTM EEG showed evidence of epileptogenicity arising from left hemisphere and right frontal region. Additionally, there is profound diffuse encephalopathy, non specific etiology but likely due to seizure and sedation. AEDs adjusted. (Phosphenytoin loaded and dilantin started. Depakote dose increased to 350 q6). ID consulted for meningioencephalitis. They felt CSF c/w viral vs early bacterial process. Recommended continuing amp/CTX and acyclovir. Added HCM screen for syphilis and HIV, also cryptococcal Ag.  8/9 CT angio: negative for large vessel occlusion. Atheromatous change about the carotid bifurcations/proximal ICAs., both vertebral arteries, and t/o intracranial circulation.  Crypto ag neg, CSF culture: neg, Varicella zoster: neg. HSV neg. Culture analysis to date unrevealing. TGs climbing to 738 (started at 189 three days prior. Added versed. Prop capped at 40. Phenobarb added 8/10 Continues to have epileptiform burst. Perampranel added.  8/11 continues to have bursts on LTM, repeat LP  8/12 isoelectric on LTM 8/13 recurrent seizures overnight, planning for pentobarbital coma  Interim History / Subjective:   Has been in Phenobarbital coma since 8/13, started weaning this AM and EEG showing showing 1-2 seconds of generalized bursts every 30-60 seconds.  +13L since admission.  Na 151 this AM, glucose above goal. CSF culture with no growth  Objective   Blood pressure (!) 115/47, pulse 63, temperature 98.8 F (37.1 C), temperature source Oral, resp. rate 18, height '5\' 3"'$  (1.6 m), weight 75 kg, SpO2 96 %.    Vent Mode: PRVC FiO2 (%):  [  40 %] 40 % Set Rate:  [18 bmp] 18 bmp Vt Set:  [420 mL] 420 mL PEEP:  [5 cmH20] 5  cmH20 Plateau Pressure:  [15 cmH20-18 cmH20] 17 cmH20   Intake/Output Summary (Last 24 hours) at 11/28/2020 0810 Last data filed at 11/28/2020 0700 Gross per 24 hour  Intake 2633.15 ml  Output 4600 ml  Net -1966.85 ml    Filed Weights   11/24/20 0500 11/25/20 0400 11/28/20 0500  Weight: 71.1 kg 78 kg 75 kg    General:  critically ill-appearing F, intubated and sedated HEENT: MM pink/moist, protruding tongue, pupils fixed and unresponsive, ETT in place Neuro: unresponsive, off all sedation though just discontinued several hours ago, pupils 8m and unresponsive to light, not triggering breaths on vent CV: s1s2 rrr, no m/r/g PULM:  On full vent support without wheezing or rhonchi GI: soft, bsx4 active  Extremities: warm/dry, 2+ edema  Skin: no rashes or lesions   Resolved Hospital Problem list   Acute kidney injury Sepsis   Assessment & Plan:    Status epilepticus 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 P: -pentobarbital weaned off this AM with EEG showing generalized bursts without seizures or epileptiform discharges, appreciate Neurology recommendations -repeat head CT this AM -finished empiric Acyclovir, Ampicillin and Ceftriaxone, afebrile.  One blood culture grew Staph hominis, likely not clinically significant -complete empiric solumedrol today -off pressors and sedation this AM -clinical picture is concerning for poor prognosis, will discuss with family and engage palliative care   Acute hypoxic respiratory failure requiring mechanical ventilation secondary sedation required for above P: -mental status precludes SBT/SAT today --Maintain full vent support with SAT/SBT as tolerated -titrate Vent setting to maintain SpO2 greater than or equal to 90%. -HOB elevated 30 degrees. -Plateau pressures less than 30 cm  H20.  -Follow chest x-ray, ABG prn.   -Bronchial hygiene and RT/bronchodilator protocol.    Total body volume overload, chronic HFpEF Echo 8/9 with EF 70-75% and grade I diastolic dysfunction, no PFO P -Lasix '40mg'$  bid initiated 8/14, 4L UOP yesterday but remains 13L positive since admission  Acute microcytic anemia.  -Stable, monitor   Hypernatremia Na 151 P: -start free water flushes '100mg'$  q8hrs  Type 2 DM.  Glucose elevated after steroid course P: -start Lantus 5 units qhs and continue resistant scale SSI   High risk malnutrition -Continue TF  Best Practice (right click and "Reselect all SmartList Selections" daily)   Diet/type: Tube feeds DVT prophylaxis: Enoxaparin GI prophylaxis: PPI Lines: PICC, NG, ETT, Foley Foley:  Yes, and it is still needed Code Status:  full code Family update pending for 8/15  CRITICAL CARE Performed by: LOtilio CarpenGleason   Total critical care time: 40  minutes  Critical care time was exclusive of separately billable procedures and treating other patients.  Critical care was necessary to treat or prevent imminent or life-threatening deterioration.  Critical care was time spent personally by me on the following activities: development of treatment plan with patient and/or surrogate as well as nursing, discussions with consultants, evaluation of patient's response to treatment, examination of patient, obtaining history from patient or surrogate, ordering and performing treatments and interventions, ordering and review of laboratory studies, ordering and review of radiographic studies, pulse oximetry and re-evaluation of patient's condition.  LOtilio CarpenGleason, PA-C [facility] See Amion for pager If no  response to pager , please call [phone] until 7pm After 7:00 pm call Elink  [id number]?[place]

## 2020-11-28 NOTE — Plan of Care (Signed)
Spoke with pt's daughter Darienne Baus and updated, questions answered.  She states she would want a tracheostomy if indicated and agrees with including palliative care to assist with [place] discussions.  Otilio Carpen Octavion Mollenkopf, PA-C

## 2020-11-28 NOTE — Consult Note (Addendum)
Consultation Note Date: 11/28/2020   Patient Name: Margaret Rangel  DOB: July 06, 1945  MRN: [id number]  Age / Sex: 75 y.o., female  PCP: Lyndee Hensen, DO Referring Physician: Kipp Brood, MD  Reason for Consultation: Establishing goals of care  HPI/Patient Profile: 75 y.o. female  with past medical history significant for DMII, HTN, chronic back pain, asthma, IBS, obesity, GERD, diverticulosis of colon, arthritis, HLD, colon adenoma (2009, 2012), CVA X2, and former smoker admitted on 11/28/2020 with status epilepticus. 8/5 patient's daughter found her unresponsive, performed CPR at direction of 911, and EMS  was dispatched. Upon arrival, EMS witnessed 5 generalized seizures, lasting 10-20 seconds each. Upon arrival to ED, pt intubated for airway protection, loaded with IV Keppra, and neurology was consulted.   As per CCM notes, diagnosis favors prior stroke but cannot exclude acute meningitis/encephalitis. Pt continues to have seizures and epileptiform activity on continuous EEG.    Clinical Assessment and Goals of Care: I have reviewed medical records including EPIC notes, labs and imaging, received report from bedside RN Roselyn Reef, assessed the patient and then spoke with daughter Regina Eck at bedside to discuss diagnosis prognosis, GOC, EOL wishes, disposition and options.  I introduced Palliative Medicine (PM) as specialized medical care for people living with serious illness. I conveyed that PM focuses on providing relief from the symptoms and stress of a serious illness. The goal is to improve quality of life for both the patient and the family.  We discussed a brief life review of the patient. As per the daughter, patient was "fiesty" and liked to be in control and very independent. PTA Pt was independent, though not driving, managing own finances and medications, cooking/good intake, and living alone .  Regina Eck also notes that she has found notebooks where her mother began to write down everything she was doing beginning in June 2022 (ex: "took meds, drank coffee, ate breakfast," etc) - I question if there were some beginning deficits in June. She has three children - Jacinto Halim, and Juliann Pulse.    We discussed patient's current illness and what it means in the larger context of patient's on-going co-morbidities. Terwanda asked appropriate questions regarding the patient's prognosis. She inquired about the need for anti-seizure medications for the rest of the patient's life, whether or not she would "wake up" fully from these recent seizures, and what barriers existed between now and the patient receiving a tracheostomy. We discussed the physical milestones the patient needs to meet in order to progress. For example, the patient will need to have better control of seizure activity and then improved mentation in order to be able to breathe on her own and protect her airway.  Discussed with patient/family the importance of continued conversation with family and the medical providers regarding overall plan of care and treatment options, ensuring decisions are within the context of the patient's values and GOCs.    Advance directives, concepts specific to code status, and anticipatory care needs were considered and discussed. I attempted to elicit values and goals of  care important to the patient. No ACP documents on file. Regina Eck shared that in the past the patient shared she would like to be a DNR and would not want tubes and machines to keep her alive. However, Regina Eck did not want her mother to be a DNR and they deferred the discussion to a later date. Regina Eck shared that she would not want her mother to suffer.   I did receive call from son, Legrand Como, and the above was discussed with him as well. Legrand Como is able to express his understanding and expectations that he does not feel that his mother will  ever be able to live on her own even best case scenario and is concerned about her quality of life. He has been speaking with his sisters about potential for poor outcome. Legrand Como does not that his mother should not have been living alone even prior to admission but we did not go into detail of this. All children desire ongoing care to allow time for potential improvement. They also agree for ongoing discussions and family meeting prior to any big decisions in plan of care.   Questions and concerns were addressed. The family was encouraged to call with questions or concerns.    Primary Decision Maker NEXT OF KIN  SUMMARY OF RECOMMENDATIONS -Family meeting TBD with all three children - Terwanda to call with available times - Watchful waiting  Code Status/Advance Care Planning: Full code  Prognosis:   Unable to determine  Discharge Planning: To Be Determined  Review of Systems  Unable to perform ROS - pt intubated  Physical Assessment Neuro - unable to assess - pt intubated  Cardio - RRR Musculoskeletal - tongue protruding, no contractions     Palliative Assessment/Data: 20%     I discussed this patient's plan of care with bedside RN Roselyn Reef and patient's daughter Regina Eck.  Thank you for this consult. Palliative medicine will continue to follow and assist as needed.   Time Total: 75 minutes Greater than 50%  of this time was spent counseling and coordinating care related to the above assessment and plan.  Signed by: Jordan Hawks, DNP, FNP-BC Palliative Medicine    Please contact Palliative Medicine Team phone at [phone] for questions and concerns.  For individual provider: See Malachi Paradise, NP Palliative Medicine Team Pager [phone] (Please see [email or web address] for schedule) Team Phone [phone]    Greater than 50%  of this time was spent counseling and coordinating care related to the above assessment and plan

## 2020-11-28 NOTE — Procedures (Signed)
Cortrak  Person Inserting Tube:  Braylee Lal, RD Tube Type:  Cortrak - 43 inches Tube Size:  10 Tube Location:  Right nare Initial Placement:  Stomach Secured by: Bridle Technique Used to Measure Tube Placement:  Marking at nare/corner of mouth Cortrak Secured At:  68 cm Procedure Comments:  Cortrak Tube Team Note:  Consult received to place a Cortrak feeding tube.   X-ray is required, abdominal x-ray has been ordered by the Cortrak team. Please confirm tube placement before using the Cortrak tube.   If the tube becomes dislodged please keep the tube and contact the Cortrak team at [email or web address] (password [email or web address]) for replacement.  If after hours and replacement cannot be delayed, place a NG tube and confirm placement with an abdominal x-ray.    Mariana Single MS, RD, LDN, CNSC Clinical Nutrition Pager listed in [place]

## 2020-11-29 ENCOUNTER — Inpatient Hospital Stay (HOSPITAL_COMMUNITY): Payer: Medicare Other

## 2020-11-29 DIAGNOSIS — R4182 Altered mental status, unspecified: Secondary | ICD-10-CM | POA: Diagnosis not present

## 2020-11-29 DIAGNOSIS — R569 Unspecified convulsions: Secondary | ICD-10-CM

## 2020-11-29 DIAGNOSIS — G9340 Encephalopathy, unspecified: Secondary | ICD-10-CM

## 2020-11-29 LAB — BASIC METABOLIC PANEL
Anion gap: 8 (ref 5–15)
BUN: 42 mg/dL — ABNORMAL HIGH (ref 8–23)
CO2: 27 mmol/L (ref 22–32)
Calcium: 7.5 mg/dL — ABNORMAL LOW (ref 8.9–10.3)
Chloride: 116 mmol/L — ABNORMAL HIGH (ref 98–111)
Creatinine, Ser: 0.96 mg/dL (ref 0.44–1.00)
GFR, Estimated: >60 mL/min (ref >60)
Glucose, Bld: 212 mg/dL — ABNORMAL HIGH (ref 70–99)
Potassium: 3.2 mmol/L — ABNORMAL LOW (ref 3.5–5.1)
Sodium: 151 mmol/L — ABNORMAL HIGH (ref 135–145)

## 2020-11-29 LAB — POCT I-STAT 7, (LYTES, BLD GAS, ICA,H+H)
Acid-base deficit: 14 mmol/L — ABNORMAL HIGH (ref 0.0–2.0)
Bicarbonate: 12.4 mmol/L — ABNORMAL LOW (ref 20.0–28.0)
Calcium, Ion: 1 mmol/L — ABNORMAL LOW (ref 1.15–1.40)
HCT: 25 % — ABNORMAL LOW (ref 36.0–46.0)
Hemoglobin: 8.5 g/dL — ABNORMAL LOW (ref 12.0–15.0)
O2 Saturation: 97 %
Patient temperature: 102.3
Potassium: 5.8 mmol/L — ABNORMAL HIGH (ref 3.5–5.1)
Sodium: 150 mmol/L — ABNORMAL HIGH (ref 135–145)
TCO2: 13 mmol/L — ABNORMAL LOW (ref 22–32)
pCO2 arterial: 33.1 mmHg (ref 32.0–48.0)
pH, Arterial: 7.194 — CL (ref 7.350–7.450)
pO2, Arterial: 115 mmHg — ABNORMAL HIGH (ref 83.0–108.0)

## 2020-11-29 LAB — MAGNESIUM: Magnesium: 2.5 mg/dL — ABNORMAL HIGH (ref 1.7–2.4)

## 2020-11-29 LAB — CBC WITH DIFFERENTIAL/PLATELET
Abs Immature Granulocytes: 0.42 10*3/uL — ABNORMAL HIGH (ref 0.00–0.07)
Basophils Absolute: 0 10*3/uL (ref 0.0–0.1)
Basophils Relative: 0 %
Eosinophils Absolute: 0 10*3/uL (ref 0.0–0.5)
Eosinophils Relative: 0 %
HCT: 31.3 % — ABNORMAL LOW (ref 36.0–46.0)
Hemoglobin: 10 g/dL — ABNORMAL LOW (ref 12.0–15.0)
Immature Granulocytes: 4 %
Lymphocytes Relative: 11 %
Lymphs Abs: 1.2 10*3/uL (ref 0.7–4.0)
MCH: 25.3 pg — ABNORMAL LOW (ref 26.0–34.0)
MCHC: 31.9 g/dL (ref 30.0–36.0)
MCV: 79 fL — ABNORMAL LOW (ref 80.0–100.0)
Monocytes Absolute: 0.8 10*3/uL (ref 0.1–1.0)
Monocytes Relative: 8 %
Neutro Abs: 7.9 10*3/uL — ABNORMAL HIGH (ref 1.7–7.7)
Neutrophils Relative %: 77 %
Platelets: 373 10*3/uL (ref 150–400)
RBC: 3.96 MIL/uL (ref 3.87–5.11)
RDW: 18.5 % — ABNORMAL HIGH (ref 11.5–15.5)
WBC: 10.3 10*3/uL (ref 4.0–10.5)
nRBC: 3.8 % — ABNORMAL HIGH (ref 0.0–0.2)

## 2020-11-29 LAB — AMMONIA: Ammonia: 47 umol/L — ABNORMAL HIGH (ref 9–35)

## 2020-11-29 LAB — GLUCOSE, CAPILLARY
Glucose-Capillary: 182 mg/dL — ABNORMAL HIGH (ref 70–99)
Glucose-Capillary: 210 mg/dL — ABNORMAL HIGH (ref 70–99)
Glucose-Capillary: 215 mg/dL — ABNORMAL HIGH (ref 70–99)
Glucose-Capillary: 218 mg/dL — ABNORMAL HIGH (ref 70–99)
Glucose-Capillary: 228 mg/dL — ABNORMAL HIGH (ref 70–99)
Glucose-Capillary: 249 mg/dL — ABNORMAL HIGH (ref 70–99)

## 2020-11-29 LAB — PATHOLOGIST SMEAR REVIEW

## 2020-11-29 LAB — PHOSPHORUS: Phosphorus: 2.2 mg/dL — ABNORMAL LOW (ref 2.5–4.6)

## 2020-11-29 LAB — PREPARE RBC (CROSSMATCH)

## 2020-11-29 MED ORDER — VITAL AF 1.2 CAL PO LIQD: 1000.0000 mL | ORAL | Status: DC

## 2020-11-29 MED ORDER — NOREPINEPHRINE 4 MG/250ML-% IV SOLN
0.0000 ug/min | INTRAVENOUS | Status: DC
  Administered 2020-11-29: 40 ug/min via INTRAVENOUS
  Administered 2020-11-30 (×2): 60 ug/min via INTRAVENOUS
  Administered 2020-11-30 (×3): 40 ug/min via INTRAVENOUS
  Filled 2020-11-29: qty 500
  Filled 2020-11-29 (×3): qty 250
  Filled 2020-11-29: qty 500

## 2020-11-29 MED ORDER — POTASSIUM CHLORIDE 20 MEQ PO PACK: 40.0000 meq | PACK | Freq: Once | ORAL | Status: DC

## 2020-11-29 MED ORDER — FUROSEMIDE 10 MG/ML IJ SOLN: 40.0000 mg | Freq: Every day | INTRAMUSCULAR | Status: DC

## 2020-11-29 MED ORDER — PHENYLEPHRINE HCL-NACL 20-0.9 MG/250ML-% IV SOLN
0.0000 ug/min | INTRAVENOUS | Status: DC
  Filled 2020-11-29: qty 250

## 2020-11-29 MED ORDER — ALBUMIN HUMAN 5 % IV SOLN
12.5000 g | Freq: Once | INTRAVENOUS | Status: AC
  Administered 2020-11-29: 12.5 g via INTRAVENOUS

## 2020-11-29 MED ORDER — PHENOBARBITAL 20 MG/5ML PO ELIX: 130.0000 mg | ORAL_SOLUTION | Freq: Two times a day (BID) | ORAL | Status: DC

## 2020-11-29 MED ORDER — ALBUMIN HUMAN 5 % IV SOLN
INTRAVENOUS | Status: AC
  Filled 2020-11-29: qty 250

## 2020-11-29 MED ORDER — VALPROIC ACID 250 MG/5ML PO SOLN
350.0000 mg | Freq: Four times a day (QID) | ORAL | Status: DC
  Administered 2020-11-29 (×2): 350 mg
  Filled 2020-11-29 (×2): qty 10

## 2020-11-29 MED ORDER — SODIUM CHLORIDE 0.45 % IV SOLN: INTRAVENOUS | Status: DC

## 2020-11-29 MED ORDER — SODIUM BICARBONATE 8.4 % IV SOLN
50.0000 meq | Freq: Once | INTRAVENOUS | Status: AC
  Administered 2020-11-29: 50 meq via INTRAVENOUS

## 2020-11-29 MED ORDER — SODIUM CHLORIDE 0.9 % IV SOLN
1.0000 g | Freq: Once | INTRAVENOUS | Status: AC
  Administered 2020-11-30: 1 g via INTRAVENOUS
  Filled 2020-11-29: qty 10

## 2020-11-29 MED ORDER — POTASSIUM CHLORIDE 20 MEQ PO PACK
40.0000 meq | PACK | Freq: Once | ORAL | Status: AC
  Administered 2020-11-29: 40 meq
  Filled 2020-11-29: qty 2

## 2020-11-29 MED ORDER — SODIUM CHLORIDE 0.9% IV SOLUTION: Freq: Once | INTRAVENOUS | Status: AC

## 2020-11-29 MED ORDER — PHENYTOIN 50 MG PO CHEW
100.0000 mg | CHEWABLE_TABLET | Freq: Three times a day (TID) | ORAL | Status: DC
  Administered 2020-11-29: 100 mg
  Filled 2020-11-29 (×2): qty 2

## 2020-11-29 MED ORDER — VASOPRESSIN 20 UNITS/100 ML INFUSION FOR SHOCK
0.0000 [IU]/min | INTRAVENOUS | Status: DC
  Administered 2020-11-29: 0.03 [IU]/min via INTRAVENOUS
  Administered 2020-11-30: 0.04 [IU]/min via INTRAVENOUS
  Filled 2020-11-29 (×2): qty 100

## 2020-11-29 MED ORDER — PHENOBARBITAL 20 MG/5ML PO ELIX
130.0000 mg | ORAL_SOLUTION | Freq: Two times a day (BID) | ORAL | Status: DC
  Administered 2020-11-29: 130 mg
  Filled 2020-11-29: qty 37.5

## 2020-11-29 MED ORDER — NOREPINEPHRINE 4 MG/250ML-% IV SOLN
INTRAVENOUS | Status: AC
  Filled 2020-11-29: qty 250

## 2020-11-29 MED ORDER — LEVETIRACETAM 100 MG/ML PO SOLN
2000.0000 mg | Freq: Two times a day (BID) | ORAL | Status: DC
  Administered 2020-11-29: 2000 mg
  Filled 2020-11-29: qty 20

## 2020-11-29 MED ORDER — FREE WATER
250.0000 mL | Freq: Four times a day (QID) | Status: DC
  Administered 2020-11-29 (×2): 250 mL

## 2020-11-29 MED ORDER — ALBUMIN HUMAN 5 % IV SOLN
25.0000 g | Freq: Once | INTRAVENOUS | Status: AC
  Administered 2020-11-30: 25 g via INTRAVENOUS

## 2020-11-29 NOTE — Progress Notes (Signed)
NAME:  Margaret Rangel, MRN:  [id number], DOB:  10-02-1945, LOS: 82 ADMISSION DATE:  12/01/2020, CONSULTATION DATE:  8/5 REFERRING MD:  picekring, CHIEF COMPLAINT:  seizure    History of Present Illness:  75 year old female with history as per below.  Per family was in usual state of health until 8/4 when her daughter noted she reported more fatigue than usual.  Otherwise no pertinent history observed.Found by her daughter this morning on 8/5 lying on the floor unresponsive.  She was last seen normal the night before.  When her daughter found her, she was nonverbal, unresponsive, had eye deviation upward.  She initiated CPR at the instructions of 911, and called EMS.  On EMS arrival they witnessed at least 5 generalized seizures.  These lasted 10 to 20 seconds apiece with forced gaze deviation to the right and twitching right upper extremity.  She was treated by EMS with 5 mg of midazolam in route, she was given another 2 mg in the emergency room for ongoing seizure activity, loaded with IV Keppra, intubated for airway protection, and neurology was consulted.  Continuous EEG was initiated, additional pertinent findings within the emergency room: New leukocytosis, low-grade fever, some postintubation hypotension which responded to volume resuscitation.  Critical care asked to admit.  Significant Hospital Events: Including procedures, antibiotic start and stop dates in addition to other pertinent events   8/5 intubated for airway protection after being given several doses of benzodiazapines and loaded w/ keppra. IV vanc,ceftriaxone and acyclovir started. LP planned. LTM initiated. CT head w/ encephalomalacia and multiple prior infacts.  8/6: LP was done yesterday, showing high white count, and neutrophil predominate.  EEG showed continuous seizure 8/7: EEG showed continuous seizure, propofol was uptitrated 8/8: MRI multiple small foci thru left cerebral hemisphere in watershed distribution. Restricted  diffusion left thalamus and hippocampus c/w refractory seizure, chronic microvascular disease. Remote lacunar infacts in left fronta, parietaloccipital, bilateral BG, thalami, pons and R cerebellar hemisphere. Started on ASA and lipitor. CTA of head and neck as well as TTE ordered. LTM EEG showed evidence of epileptogenicity arising from left hemisphere and right frontal region. Additionally, there is profound diffuse encephalopathy, non specific etiology but likely due to seizure and sedation. AEDs adjusted. (Phosphenytoin loaded and dilantin started. Depakote dose increased to 350 q6). ID consulted for meningioencephalitis. They felt CSF c/w viral vs early bacterial process. Recommended continuing amp/CTX and acyclovir. Added HCM screen for syphilis and HIV, also cryptococcal Ag.  8/9 CT angio: negative for large vessel occlusion. Atheromatous change about the carotid bifurcations/proximal ICAs., both vertebral arteries, and t/o intracranial circulation.  Crypto ag neg, CSF culture: neg, Varicella zoster: neg. HSV neg. Culture analysis to date unrevealing. TGs climbing to 738 (started at 189 three days prior. Added versed. Prop capped at 40. Phenobarb added 8/10 Continues to have epileptiform burst. Perampranel added.  8/11 continues to have bursts on LTM, repeat LP  8/12 isoelectric on LTM 8/13 recurrent seizures overnight, planning for pentobarbital coma 8/16 Off all sedation, no further seizure activity   Interim History / Subjective:   Remains largely unresponsive though EEG without seizures  11L +  Objective   Blood pressure 136/60, pulse 100, temperature (!) 102.2 F (39 C), temperature source Axillary, resp. rate (!) 25, height '5\' 3"'$  (1.6 m), weight 70 kg, SpO2 99 %.    Vent Mode: PRVC FiO2 (%):  [50 %] 50 % Set Rate:  [18 bmp] 18 bmp Vt Set:  [420 mL] 420 mL PEEP:  [  Ramirez-Perez Pressure:  [17 cmH20-19 cmH20] 18 cmH20   Intake/Output Summary (Last 24 hours) at 11/29/2020  1257 Last data filed at 11/29/2020 1200 Gross per 24 hour  Intake 3237.2 ml  Output 4950 ml  Net -1712.8 ml    Filed Weights   11/25/20 0400 11/28/20 0500 11/29/20 0417  Weight: 78 kg 75 kg 70 kg    General:  critically ill-appearing F, intubated and sedated HEENT: MM pink/moist, protruding tongue, pupils sluggishly responsive ETT in place Neuro: slight grimace to pain, triggering breaths on vent, minimal corneal reflexes, , off all sedation, pupils 76m and unresponsive to light,  CV: s1s2 rrr, no m/r/g PULM:  On full vent support without wheezing or rhonchi GI: soft, bsx4 active  Extremities: warm/dry, 2+ edema  Skin: no rashes or lesions   Resolved Hospital Problem list   Acute kidney injury Sepsis   Assessment & Plan:    Status epilepticus Profound, severe encephalopathy 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 P: -off pentobarbital since 8/15,  -pentobarbital weaned off this AM with EEG showed evidence of epileptogenicity with generalized onset and high potential for seizure recurrence as well as severe to profound diffuse encephalopathy - appreciate Neurology recommendations, continue Topiramate 200 mg twice daily, Depakote 350 mg every 6 hours, Keppra 2000 mg twice daily, Dilantin 100 mg every 8 hours, phenobarb '130mg'$  BID, perampanel '8mg'$  -repeat head CT yesterday without acute findings  -finished empiric Acyclovir, Ampicillin and Ceftriaxone, afebrile.  One blood culture grew Staph hominis, likely not clinically significant -complete empiric solumedrol today -off pressors and sedation this AM -Appreciate palliative care support, family plans to move forward with tracheostomy.  Pt has a protruding tongue that is pressing into her lower teeth and would like to remove ETT sooner rather than later.   Neurology would  like to give patient more time to ensure no further seizure activity. Current plan is for 8/18 trach.   Acute hypoxic respiratory failure requiring mechanical ventilation secondary sedation required for above P: -mental status precludes SBT/SAT today --Maintain full vent support with SAT/SBT as tolerated -titrate Vent setting to maintain SpO2 greater than or equal to 90%. -HOB elevated 30 degrees. -Plateau pressures less than 30 cm H20.  -Follow chest x-ray, ABG prn.   -Bronchial hygiene and RT/bronchodilator protocol.    Total body volume overload, chronic HFpEF Echo 8/9 with EF 70-75% and grade I diastolic dysfunction, no PFO P -Continue Lasix '40mg'$  qd, UOP 4L again yesterday  Acute microcytic anemia.  -Stable, monitor   Hypernatremia Na 151 today P: -increase free water flushes to 250cc q 6hrs  Type 2 DM.  Glucose elevated after steroid course P: -start Lantus 5 units qhs and continue resistant scale SSI   High risk malnutrition -Continue TF  Best Practice (right click and "Reselect all SmartList Selections" daily)   Diet/type: Tube feeds DVT prophylaxis: Enoxaparin GI prophylaxis: PPI Lines: PICC, NG, ETT, Foley Foley:  Yes, and it is still needed Code Status:  full code Family update pending for 8/15  CRITICAL CARE Performed by: LOtilio CarpenGleason   Total critical care time: 35  minutes  Critical care time was exclusive of separately billable procedures and treating other patients.  Critical care was necessary to treat or prevent imminent or life-threatening deterioration.  Critical care was time spent personally by me on the following activities:  development of treatment plan with patient and/or surrogate as well as nursing, discussions with consultants, evaluation of patient's response to treatment, examination of patient, obtaining history from patient or surrogate, ordering and performing treatments and interventions, ordering and review of laboratory  studies, ordering and review of radiographic studies, pulse oximetry and re-evaluation of patient's condition.  Otilio Carpen [place] Kirchner, PA-C [facility] See Amion for pager If no response to pager , please call [phone] until 7pm After 7:00 pm call Elink  [id number]?[place]

## 2020-11-29 NOTE — Progress Notes (Signed)
Palliative Care Progress Note, Assessment & Plan   Patient Name: Margaret Rangel       Date: 11/29/2020 DOB: 06-27-1945  Age: 75 y.o. MRN#: [id number] Attending Physician: Kipp Brood, MD Primary Care Physician: Lyndee Hensen, DO Admit Date: 11/26/2020  Reason for Consultation/Follow-up: Establishing goals of care  Subjective: Pt is resting in NAD while intubated. No sedation at this time.   HPI: 75 y.o. female  with past medical history significant for DMII, HTN, chronic back pain, asthma, IBS, obesity, GERD, diverticulosis of colon, arthritis, HLD, colon adenoma (2009, 2012), CVA X2, and former smoker admitted on 12/02/2020 with status epilepticus. 8/5 patient's daughter found her unresponsive, performed CPR at direction of 911, and EMS  was dispatched. Upon arrival to residence, EMS witnessed 5 generalized seizures, lasting 10-20 seconds each. Upon arrival to ED, pt intubated for airway protection, loaded with IV Keppra, and neurology was consulted.    As per CCM: diagnosis favors prior stroke but cannot exclude acute meningitis/encephalitis. All sedation has been weaned off and patient has minimal response to painful stimuli this AM. Concerns include if patient seizes prior to trach placement and that her tongue is edematous and protruding with concern for potential necrosis.   As per neurology notes: as sedation was weaned off, EEG showed evidence of epileptogenicity with generalized onset and high potential for seizure recurrence as well as severe to profound diffuse encephalopathy, nonspecific etiology but likely due to sedation.  No seizures were seen during EEG.  Plan of Care: I have reviewed medical records including EPIC notes, labs and imaging, received report from bedside RN (no family at  bedside this AM), assessed the patient and then met with CMM Drs. Agarwala and Gleason  to discuss diagnosis prognosis, GOC, disposition and options.  We discussed patient's current illness and what it means in the larger context of patient's on-going co-morbidities.    Advance directives and concepts specific to code status were addressed. The patient has expressed in the past to her daughter Margaret Rangel that she would like to be a DNR and would not want to be kept alive with tubes and machines. I reiterated this to the team this morning. At this point, the patient is no longer seizing, which is clinical progress. However, her functional neuro status is still TBD. We reviewed the possiblity of placing a trach to avoid further injury to the tongue.  I spoke with neurology this morning who recommended that we pend trach placement at this time and watch/wait to see what if the patient seizes or continues to wake up/reach more neurological milestone. I circled back with CCM via secure chat and they confirmed agreement that trach placement will be postponed until at Thursday.   I discussed with daughter, Margaret Rangel, who confirms family desire to pursue tracheostomy and allow every chance of recovery. We discussed these measures to allow time to improve and "get [place] over the hump" and Margaret Rangel confirms that her mother would not want to be kept alive long term by machines/ if she does not improve to an acceptable quality of life ("she loves to talk" and she values her independence). There will need to be ongoing conversations with family regarding the details of  what constitutes an acceptable quality of life for Ms. Hassell Done.   Will be available as family needs further discussion (they have palliative contact info). Please call if seizures began again. Otherwise I will plan to check in with them again on Monday when I return on service.   Summary and Recommendations -Await washout of phenobarbital to see if patient  gains some level of functional neuro status without seizing -Continue FULL CODE status  Discharge Planning: To Be Determined  Care plan was discussed with CCM MDs Agarwala, Gleason, bedside RN, and NP Jordan Hawks  Length of Stay: 11  Physical Exam Vitals and nursing note reviewed.  Constitutional:      Appearance: She is not toxic-appearing.  HENT:     Mouth/Throat:     Comments: Protruding, edematous tongue, not retractable Eyes:     Pupils: Pupils are equal, round, and reactive to light.  Cardiovascular:     Rate and Rhythm: Normal rate.  Pulmonary:     Comments: intubated Abdominal:     Palpations: Abdomen is soft.  Skin:    General: Skin is warm and dry.            Vital Signs: BP (!) 122/52   Pulse 97   Temp 99.9 F (37.7 C) (Axillary)   Resp (!) 25   Ht '5\' 3"'  (1.6 m)   Wt 70 kg   SpO2 97%   BMI 27.34 kg/m  SpO2: SpO2: 97 % O2 Device: O2 Device: Ventilator O2 Flow Rate:        Palliative Assessment/Data: 20%       Total Time 45 minutes Prolonged Time Billed  no   Greater than 50%  of this time was spent counseling and coordinating care related to the above assessment and plan.  Thank you for allowing the Palliative Medicine Team to assist in the care of this patient.  Vinie Sill, NP Palliative Medicine Team Pager [phone] (Please see [email or web address] for schedule) Team Phone [phone]    Greater than 50%  of this time was spent counseling and coordinating care related to the above assessment and plan

## 2020-11-29 NOTE — Progress Notes (Signed)
Inpatient Diabetes Program Recommendations  AACE/ADA: New Consensus Statement on Inpatient Glycemic Control (2015)  Target Ranges:  Prepandial:   less than 140 mg/dL      Peak postprandial:   less than 180 mg/dL (1-2 hours)      Critically ill patients:  140 - 180 mg/dL   Lab Results  Component Value Date   GLUCAP 215 (H) 11/29/2020   HGBA1C 7.1 (H) 11/20/2020    Review of Glycemic Control  Diabetes history: DM2 Outpatient Diabetes medications: Jardiance 10 mg QD, metformin 1000 mg QAM Current orders for Inpatient glycemic control: Semglee 5 units QHS, Novolog 0-20 Q4H  Inpatient Diabetes Program Recommendations:    Increase Semglee to 7 units QHS Add TF coverage - Novolog 3 units Q4H. Do not give if TF is d/ced or held for any reason  Continue to follow.  Thank you. Lorenda Peck, RD, LDN, CDE Inpatient Diabetes Coordinator [phone]

## 2020-11-29 NOTE — Procedures (Addendum)
Patient Name: Margaret Rangel  MRN: [id number]  Epilepsy Attending: Lora Havens  Referring Physician/Provider: Dr Donnetta Simpers Duration: 11/28/2020 1120 to 11/29/2020 1120   Patient history:  75 y.o. female with PMH significant for prior stroke who presented via EMS with unresponsiveness and seizures. Had back to back seizures with no return to baseline in between. EEG due to concern for status epilepticus.    Level of alertness:  comatose   AEDs during EEG study: LEV, VPA, PHT, Perampanel, topiramate   Technical aspects: This EEG study was done with scalp electrodes positioned according to the 10-20 International system of electrode placement. Electrical activity was acquired at a sampling rate of '500Hz'$  and reviewed with a high frequency filter of '70Hz'$  and a low frequency filter of '1Hz'$ . EEG data were recorded continuously and digitally stored.   Description: EEG initially showed 2-5 seconds of generalized bursts every 10 seconds.  Gradually, EEG showed generalized periodic epileptiform discharges with overriding fast activity at 1 Hz alternating with generalized EEG attenuation lasting 2 to 4 seconds.   ABNORMALITY - Burst suppression, generalized -Generalized periodic epileptiform discharges with overriding fast activity, ( GPEDs+) -EEG attenuation, generalized  IMPRESSION: This study was initially suggestive of profound diffuse encephalopathy, non specific etiology but likely due to sedation.  As sedation was weaned off, EEG showed evidence of epileptogenicity with generalized onset and high potential for seizure recurrence as well as severe to profound diffuse encephalopathy, nonspecific etiology but likely due to sedation.  No seizures were seen during this time.   Kamaya Keckler [name]

## 2020-11-29 NOTE — Progress Notes (Addendum)
[place] Progress Note Patient Name: Margaret Rangel DOB: 08/20/1945 MRN: [id number]   Date of Service  11/29/2020  HPI/Events of Note  SBP 60, HR 58, patient had been aggressively diuresed earlier. Serum sodium 151.  eICU Interventions  Albumin 5 % 250 ml iv stat, 0.45 Saline gtt ordered, Norepinephrine gtt ordered, unfortunately patient lost her pulse and code blue called, ACLS protocol followed until [employer] arrived on the scene and took over.        Kerry Kass Emiel Kielty 11/29/2020, 10:54 PM

## 2020-11-29 NOTE — Progress Notes (Signed)
Nutrition Follow-up  DOCUMENTATION CODES:   Not applicable  INTERVENTION:   Tube feeding via Cortrak tube: Increase Vital AF 1.2 to 60 ml/h (1440 ml per day)  Provides 1728 kcal, 108 gm protein, 1167 ml free water daily  Free water: 250 ml every 6 hours Total free water: 2167 ml   NUTRITION DIAGNOSIS:   Inadequate oral intake related to inability to eat as evidenced by NPO status. Ongoing.  GOAL:   Patient will meet greater than or equal to 90% of their needs Met with TF.  MONITOR:   TF tolerance  REASON FOR ASSESSMENT:   Consult Enteral/tube feeding initiation and management  ASSESSMENT:   Pt with PMH of HTN, IBS, GERD, hernia, HLD, stroke x 2, and DM admitted from home unresponsive with seizures.    Pt discussed during ICU rounds and with RN. Pt treated for seizures, and sepsis du to probable acute meningitis/encephalitis and PNA.  Palliative care following. Possible trach placement to allow time to determine neuro recovery.   8/5 intubated after given several doses of benzodiazapines and keppra 8/6 s/p LP, EEG shows continuous seizures 8/7 started on propofol for continuous seizures 8/8 trickle TF started 8/9 TGs elevated, propofol capped at 40, added versed.  8/15 s/p cortrak placement, tip at pylorus   Patient is currently intubated on ventilator support MV: 11.5 L/min Temp (24hrs), Avg:99.5 F (37.5 C), Min:97.8 F (36.6 C), Max:102.2 F (39 C)   Medications reviewed and include: lasix, SSI, semglee, protonix, phenobarbital  Labs reviewed: Na: 151, K+: 3.2, PO4: 2.2, Ammonia: 47  CBG's: 182-218  Current TF:  Vital AF 1.2 at 45 ml/h  Prosource TF 45 ml daily Provides 1336 kcal, 92 gm protein    Diet Order:   Diet Order     None       EDUCATION NEEDS:   Not appropriate for education at this time  Skin:  Skin Assessment: Skin Integrity Issues: Skin Integrity Issues:: Stage II Stage II: anus  Last BM:  400 ml via rectal  tube  Height:   Ht Readings from Last 1 Encounters:  11/20/20 _0  (1.6 m)    Weight:   Wt Readings from Last 1 Encounters:  11/29/20 70 kg    BMI:  Body mass index is 27.34 kg/m.  Estimated Nutritional Needs:   Kcal:  1700-2000  Protein:  85-100 grams  Fluid:  >1.7 L/day  Lockie Pares., RD, LDN, CNSC See AMiON for contact information

## 2020-11-29 NOTE — Progress Notes (Signed)
LTM maint complete - no skin breakdown under: Fp1 F3 F7 P4 [place]   [facility] monitored, Event button test confirmed by [facility].

## 2020-11-29 NOTE — Progress Notes (Signed)
ID PROGRESS NOTE  CSF send out for meningitis panel on 8/9 returned results on 8/12 per [facility]. Results will be scanned to media. All negative per report out of [place] labs for meningitis panel. REF [id number]  [name] [place] [facility] [phone]

## 2020-11-29 NOTE — Progress Notes (Signed)
Subjective: Weaned pentobarbital today.  No clinical seizures overnight.  Continues to be comatose.  ROS: Unable to obtain due to poor mental status  Examination  Vital signs in last 24 hours: Temp:  [96 F (35.6 C)-102.2 F (39 C)] 102.2 F (39 C) (08/16 1120) Pulse Rate:  [67-100] 100 (08/16 1200) Resp:  [20-41] 25 (08/16 1200) BP: (94-177)/(40-128) 136/60 (08/16 1200) SpO2:  [77 %-99 %] 99 % (08/16 1200) FiO2 (%):  [50 %] 50 % (08/16 1120) Weight:  [70 kg] 70 kg (08/16 0417)  General: lying in bed, NAD CVS: pulse-normal rate and rhythm RS: intubated, symmetric expansion Extremities: Warm, dependent edema Neuro: comatose, does not open eyes to noxious stimuli, does not follow commands, PERRLA, corneal reflex present, gag reflex absent, flaccid, does not withdraw to noxious stimuli in all extremities  Basic Metabolic Panel: Recent Labs  Lab 11/25/20 0410 11/26/20 0500 11/27/20 0500 11/28/20 0505 11/29/20 0524  NA 142 145 148* 151* 151*  K 4.1 3.7 3.7 3.6 3.2*  CL 117* 117* 121* 118* 116*  CO2 19* 20* 21* 27 27  GLUCOSE 173* 292* 310* 238* 212*  BUN 14 21 24* 35* 42*  CREATININE 0.80 0.90 0.93 1.07* 0.96  CALCIUM 7.7* 7.7* 7.2* 7.3* 7.5*  MG  --   --   --  2.5* 2.5*  PHOS  --   --   --  3.2 2.2*    CBC: Recent Labs  Lab 11/25/20 0410 11/26/20 0500 11/27/20 0500 11/28/20 0505 11/29/20 0524  WBC 5.9 10.5 7.8 8.9 10.3  NEUTROABS 4.7 9.4* 6.5 6.5 7.9*  HGB 8.6* 8.5* 8.1* 9.0* 10.0*  HCT 26.7* 27.4* 26.4* 28.6* 31.3*  MCV 80.2 81.3 82.0 80.6 79.0*  PLT 186 258 290 316 373     Coagulation Studies: No results for input(s): LABPROT, INR in the last 72 hours.  Imaging No new brain imaging overnight  ASSESSMENT AND PLAN: 75 y.o. female with PMH significant for prior stroke who presented via EMS with unresponsiveness and seizures. Had back to back seizures with no return to baseline in between and concern for status epilepticus. cEEG with left PELDS and BIRDs.  Febrile in the ED and so LP was done with neutrophil predominant pleocytosis and elevated protein and glucose. LP was obtained after a dose of Antibiotics was given.    New onset refractory status epilepticus ( [place]), improving Acute encephalopathy, infectious vs seizure -Status epilepticus likely due to underlying infarcts versus autoimmune versus paraneoplastic - Sent encephalopathy/autoimmune/paraneoplastic panel and serum and CSF to [facility], report is pending   Recommendations -Continue Depakote 350 mg every 6 hours, Keppra 2000 mg twice daily, Dilantin 100 mg every 8 hours, phenobarb '130mg'$  BID, perampanel '8mg'$ , topiramate 200 mg twice daily -Continue LTM EEG while o patient comatose.  Will consider discontinuing either Wednesday or Thursday most likely -Will consider IVIG if epileptiform bursts return -Continue seizure precautions -As needed IV Ativan 2 mg for clinical seizure-like activity - Management of rest of comorbidities per primary team -Called and updated patient's daughter     CRITICAL CARE Performed by: Lora Havens   Total critical care time: 42mnutes   Critical care time was exclusive of separately billable procedures and treating other patients.   Critical care was necessary to treat or prevent imminent or life-threatening deterioration.   Critical care was time spent personally by me on the following activities: development of treatment plan with patient and/or surrogate as well as nursing, discussions with consultants, evaluation of patient's  response to treatment, examination of patient, obtaining history from patient or surrogate, ordering and performing treatments and interventions, ordering and review of laboratory studies, ordering and review of radiographic studies, pulse oximetry and re-evaluation of patient's condition.      Zeb Comfort Epilepsy [facility] For questions after 5pm please refer to AMION to reach the Neurologist on  call

## 2020-11-30 DIAGNOSIS — I469 Cardiac arrest, cause unspecified: Secondary | ICD-10-CM

## 2020-11-30 DIAGNOSIS — R579 Shock, unspecified: Secondary | ICD-10-CM

## 2020-11-30 LAB — PHOSPHORUS: Phosphorus: 3.8 mg/dL (ref 2.5–4.6)

## 2020-11-30 LAB — BASIC METABOLIC PANEL
Anion gap: 19 — ABNORMAL HIGH (ref 5–15)
Anion gap: 21 — ABNORMAL HIGH (ref 5–15)
BUN: 53 mg/dL — ABNORMAL HIGH (ref 8–23)
BUN: 44 mg/dL — ABNORMAL HIGH (ref 8–23)
CO2: 12 mmol/L — ABNORMAL LOW (ref 22–32)
CO2: 11 mmol/L — ABNORMAL LOW (ref 22–32)
Calcium: 6.5 mg/dL — ABNORMAL LOW (ref 8.9–10.3)
Calcium: 6.6 mg/dL — ABNORMAL LOW (ref 8.9–10.3)
Chloride: 115 mmol/L — ABNORMAL HIGH (ref 98–111)
Chloride: 99 mmol/L (ref 98–111)
Creatinine, Ser: 1.9 mg/dL — ABNORMAL HIGH (ref 0.44–1.00)
Creatinine, Ser: 2.16 mg/dL — ABNORMAL HIGH (ref 0.44–1.00)
GFR, Estimated: 27 mL/min — ABNORMAL LOW (ref >60)
GFR, Estimated: 23 mL/min — ABNORMAL LOW (ref >60)
Glucose, Bld: 432 mg/dL — ABNORMAL HIGH (ref 70–99)
Glucose, Bld: 823 mg/dL (ref 70–99)
Potassium: 4.9 mmol/L (ref 3.5–5.1)
Potassium: 5.6 mmol/L — ABNORMAL HIGH (ref 3.5–5.1)
Sodium: 146 mmol/L — ABNORMAL HIGH (ref 135–145)
Sodium: 131 mmol/L — ABNORMAL LOW (ref 135–145)

## 2020-11-30 LAB — CBC WITH DIFFERENTIAL/PLATELET
Abs Immature Granulocytes: 1.07 10*3/uL — ABNORMAL HIGH (ref 0.00–0.07)
Basophils Absolute: 0.1 10*3/uL (ref 0.0–0.1)
Basophils Relative: 1 %
Eosinophils Absolute: 0 10*3/uL (ref 0.0–0.5)
Eosinophils Relative: 0 %
HCT: 21 % — ABNORMAL LOW (ref 36.0–46.0)
Hemoglobin: 6.2 g/dL — CL (ref 12.0–15.0)
Immature Granulocytes: 9 %
Lymphocytes Relative: 22 %
Lymphs Abs: 2.6 10*3/uL (ref 0.7–4.0)
MCH: 28.6 pg (ref 26.0–34.0)
MCHC: 29.5 g/dL — ABNORMAL LOW (ref 30.0–36.0)
MCV: 96.8 fL (ref 80.0–100.0)
Monocytes Absolute: 0.6 10*3/uL (ref 0.1–1.0)
Monocytes Relative: 5 %
Neutro Abs: 7.8 10*3/uL — ABNORMAL HIGH (ref 1.7–7.7)
Neutrophils Relative %: 63 %
Platelets: 78 10*3/uL — ABNORMAL LOW (ref 150–400)
RBC: 2.17 MIL/uL — ABNORMAL LOW (ref 3.87–5.11)
RDW: 19 % — ABNORMAL HIGH (ref 11.5–15.5)
WBC: 12.1 10*3/uL — ABNORMAL HIGH (ref 4.0–10.5)
nRBC: 12 % — ABNORMAL HIGH (ref 0.0–0.2)

## 2020-11-30 LAB — PROTIME-INR
INR: >10 (ref 0.8–1.2)
Prothrombin Time: >90 seconds — ABNORMAL HIGH (ref 11.4–15.2)

## 2020-11-30 LAB — CBC
HCT: 22.3 % — ABNORMAL LOW (ref 36.0–46.0)
Hemoglobin: 6.9 g/dL — CL (ref 12.0–15.0)
MCH: 28.3 pg (ref 26.0–34.0)
MCHC: 30.9 g/dL (ref 30.0–36.0)
MCV: 91.4 fL (ref 80.0–100.0)
Platelets: 110 10*3/uL — ABNORMAL LOW (ref 150–400)
RBC: 2.44 MIL/uL — ABNORMAL LOW (ref 3.87–5.11)
RDW: 20 % — ABNORMAL HIGH (ref 11.5–15.5)
WBC: 14.5 10*3/uL — ABNORMAL HIGH (ref 4.0–10.5)
nRBC: 14.4 % — ABNORMAL HIGH (ref 0.0–0.2)

## 2020-11-30 LAB — PREPARE RBC (CROSSMATCH)

## 2020-11-30 LAB — POCT I-STAT 7, (LYTES, BLD GAS, ICA,H+H)
Acid-base deficit: 13 mmol/L — ABNORMAL HIGH (ref 0.0–2.0)
Bicarbonate: 13 mmol/L — ABNORMAL LOW (ref 20.0–28.0)
Calcium, Ion: 0.94 mmol/L — ABNORMAL LOW (ref 1.15–1.40)
HCT: 19 % — ABNORMAL LOW (ref 36.0–46.0)
Hemoglobin: 6.5 g/dL — CL (ref 12.0–15.0)
O2 Saturation: 98 %
Potassium: 6.5 mmol/L (ref 3.5–5.1)
Sodium: 145 mmol/L (ref 135–145)
TCO2: 14 mmol/L — ABNORMAL LOW (ref 22–32)
pCO2 arterial: 27.7 mmHg — ABNORMAL LOW (ref 32.0–48.0)
pH, Arterial: 7.279 — ABNORMAL LOW (ref 7.350–7.450)
pO2, Arterial: 114 mmHg — ABNORMAL HIGH (ref 83.0–108.0)

## 2020-11-30 LAB — GLUCOSE, CAPILLARY
Glucose-Capillary: 180 mg/dL — ABNORMAL HIGH (ref 70–99)
Glucose-Capillary: 220 mg/dL — ABNORMAL HIGH (ref 70–99)

## 2020-11-30 LAB — MAGNESIUM: Magnesium: 2.6 mg/dL — ABNORMAL HIGH (ref 1.7–2.4)

## 2020-11-30 LAB — BETA-HYDROXYBUTYRIC ACID: Beta-Hydroxybutyric Acid: 0.54 mmol/L — ABNORMAL HIGH (ref 0.05–0.27)

## 2020-11-30 LAB — LACTIC ACID, PLASMA
Lactic Acid, Venous: >11 mmol/L (ref 0.5–1.9)
Lactic Acid, Venous: >11 mmol/L (ref 0.5–1.9)

## 2020-11-30 LAB — TROPONIN I (HIGH SENSITIVITY)
Troponin I (High Sensitivity): 1314 ng/L (ref <18)
Troponin I (High Sensitivity): 16038 ng/L (ref <18)

## 2020-11-30 LAB — OCCULT BLOOD X 1 CARD TO LAB, STOOL: Fecal Occult Bld: POSITIVE — AB

## 2020-11-30 LAB — APTT: aPTT: 103 seconds — ABNORMAL HIGH (ref 24–36)

## 2020-11-30 MED ORDER — SODIUM BICARBONATE 8.4 % IV SOLN
100.0000 meq | Freq: Once | INTRAVENOUS | Status: AC
  Administered 2020-11-30: 100 meq via INTRAVENOUS
  Filled 2020-11-30: qty 100

## 2020-11-30 MED ORDER — SODIUM ZIRCONIUM CYCLOSILICATE 10 G PO PACK
10.0000 g | PACK | Freq: Once | ORAL | Status: DC
  Filled 2020-11-30: qty 1

## 2020-11-30 MED ORDER — GLYCOPYRROLATE 0.2 MG/ML IJ SOLN: 0.2000 mg | INTRAMUSCULAR | Status: DC | PRN

## 2020-11-30 MED ORDER — CALCIUM CHLORIDE 10 % IV SOLN
INTRAVENOUS | Status: AC
  Filled 2020-11-30: qty 10

## 2020-11-30 MED ORDER — GLYCOPYRROLATE 1 MG PO TABS
1.0000 mg | ORAL_TABLET | ORAL | Status: DC | PRN
  Filled 2020-11-30: qty 1

## 2020-11-30 MED ORDER — ALBUMIN HUMAN 5 % IV SOLN
25.0000 g | Freq: Once | INTRAVENOUS | Status: AC
  Administered 2020-11-30: 25 g via INTRAVENOUS
  Filled 2020-11-30: qty 500

## 2020-11-30 MED ORDER — LACTATED RINGERS IV BOLUS
500.0000 mL | Freq: Once | INTRAVENOUS | Status: AC
  Administered 2020-11-30: 500 mL via INTRAVENOUS

## 2020-11-30 MED ORDER — VITAMIN K1 10 MG/ML IJ SOLN
10.0000 mg | Freq: Once | INTRAVENOUS | Status: DC
  Filled 2020-11-30: qty 1

## 2020-11-30 MED ORDER — DIPHENHYDRAMINE HCL 50 MG/ML IJ SOLN: 25.0000 mg | INTRAMUSCULAR | Status: DC | PRN

## 2020-11-30 MED ORDER — SODIUM CHLORIDE 0.9% IV SOLUTION: Freq: Once | INTRAVENOUS | Status: DC

## 2020-11-30 MED ORDER — ACETAMINOPHEN 650 MG RE SUPP: 650.0000 mg | Freq: Four times a day (QID) | RECTAL | Status: DC | PRN

## 2020-11-30 MED ORDER — SODIUM CHLORIDE 0.9% IV SOLUTION: Freq: Once | INTRAVENOUS | Status: AC

## 2020-11-30 MED ORDER — ACETAMINOPHEN 325 MG PO TABS: 650.0000 mg | ORAL_TABLET | Freq: Four times a day (QID) | ORAL | Status: DC | PRN

## 2020-11-30 MED ORDER — POLYVINYL ALCOHOL 1.4 % OP SOLN: 1.0000 [drp] | Freq: Four times a day (QID) | OPHTHALMIC | Status: DC | PRN

## 2020-12-01 LAB — QUANTIFERON-TB GOLD PLUS (RQFGPL)
QuantiFERON Mitogen Value: 0.25 IU/mL
QuantiFERON Nil Value: 0.02 IU/mL
QuantiFERON TB1 Ag Value: 0.02 IU/mL
QuantiFERON TB2 Ag Value: 0.02 IU/mL

## 2020-12-01 LAB — BPAM FFP
Blood Product Expiration Date: 202208212359
Blood Product Expiration Date: 202208212359
ISSUE DATE / TIME: 202208170730
ISSUE DATE / TIME: 202208170730
Unit Type and Rh: 600
Unit Type and Rh: 6200

## 2020-12-01 LAB — BPAM RBC
Blood Product Expiration Date: 202209152359
Blood Product Expiration Date: 202209152359
Blood Product Expiration Date: 202209202359
Blood Product Expiration Date: 202209202359
ISSUE DATE / TIME: 202208170027
ISSUE DATE / TIME: 202208170206
ISSUE DATE / TIME: 202208170512
ISSUE DATE / TIME: 202208170512
Unit Type and Rh: 5100
Unit Type and Rh: 5100
Unit Type and Rh: 5100
Unit Type and Rh: 5100

## 2020-12-01 LAB — TYPE AND SCREEN
ABO/RH(D): O POS
Antibody Screen: NEGATIVE
Unit division: 0
Unit division: 0
Unit division: 0
Unit division: 0

## 2020-12-01 LAB — PREPARE FRESH FROZEN PLASMA: Unit division: 0

## 2020-12-01 LAB — QUANTIFERON-TB GOLD PLUS: QuantiFERON-TB Gold Plus: UNDETERMINED — AB

## 2020-12-02 LAB — ACID FAST SMEAR (AFB, MYCOBACTERIA): Acid Fast Smear: NEGATIVE

## 2020-12-15 NOTE — Progress Notes (Signed)
2200: Pt BP was soft 71/50 (58). Rechecked multiple times with consistent BP readings. Elink notified of softer pressures and discontinued levo orders. While waiting on a call back from CCM MD, the patient went from [place] in the 110s to afib with a rate in the 70s. This RN was also unable to get a SpO2 reading despite trying multiple fingers and cords.   2245: Dr. Lucile Shutters on camera gave an order for levophed and albumin. Pt BP did not take, this RN checked for a pulse. No pulse present. Pt in PEA. CPR started and Code Blue activated at 2253.  ROSC achieved at 2301. Arterial line attempted, pt maxed on levo and vaso. CCM ground team at bedside. RN will continue to monitor.

## 2020-12-15 NOTE — Progress Notes (Signed)
[name] Progress Note Patient Name: Margaret Rangel DOB: 06/01/1945 MRN: [id number]   Date of Service  December 19, 2020  HPI/Events of Note  Hemoglobin 6.9 gm / dl.  eICU Interventions  Patient just received 1 unit PRBC, H & H will be sent per protocol.        Kerry Kass Alianys Chacko 2020-12-19, 3:59 AM

## 2020-12-15 NOTE — Progress Notes (Addendum)
[place] Progress Note Patient Name: Margaret Rangel DOB: July 06, 1945 MRN: [id number]   Date of Service  12/30/20  HPI/Events of Note  Patient with recurrence of hypotension. Stat PT-INR, PTT ordered.  eICU Interventions  Stat transfusion of 2 units PRBC, Vasopressin increased to 0.04, Levophed increased to 60 mcg, if patient is not stable enough for stat CTA to try to localize bleeding will discuss transition to comfort measures with the family.           Kerry Kass Volanda Mangine 2020/12/30, 4:25 AM

## 2020-12-15 NOTE — Progress Notes (Signed)
   11/29/20 2258  Clinical Encounter Type  Visited With Health care provider  Visit Type Code  Referral From Nurse  Consult/Referral To Chaplain  Chaplain responded to code blue. Medical team worked on stabilizing Margaret Rangel.  Her daughter was called and informed of the code blue.   Chaplain assistance was currently not needed  [employer] [phone]

## 2020-12-15 NOTE — Progress Notes (Signed)
Critical care brief progress note.  The patient's condition continues to deteriorate again.  She remains very volume responsive.  Hemoglobin is trending downward.  Updated patient's family at bedside.  Explained to them that despite aggressive medical therapy the patient may die tonight.  Answered all questions.  They appeared to understand the gravity of the situation.

## 2020-12-15 NOTE — Procedures (Signed)
Patient Name: Margaret Rangel  MRN: [id number]  Epilepsy Attending: Lora Havens  Referring Physician/Provider: Dr Donnetta Simpers Duration: 11/29/2020 1120 to 12-19-2020 0936   Patient history:  75 y.o. female with PMH significant for prior stroke who presented via EMS with unresponsiveness and seizures. Had back to back seizures with no return to baseline in between. EEG due to concern for status epilepticus.    Level of alertness:  comatose   AEDs during EEG study: LEV, VPA, PHT, Perampanel, topiramate   Technical aspects: This EEG study was done with scalp electrodes positioned according to the 10-20 International system of electrode placement. Electrical activity was acquired at a sampling rate of '500Hz'$  and reviewed with a high frequency filter of '70Hz'$  and a low frequency filter of '1Hz'$ . EEG data were recorded continuously and digitally stored.   Description: EEG initially showed generalized and lateralized right hemisphere periodic epileptiform discharges with overriding fast activity at 1 Hz alternating with generalized EEG attenuation lasting 2 to 4 seconds.  Gradually EEG evolved into continuous generalized low amplitude 3 to 6 Hz theta and delta slowing.  Polyspikes were also noted in right hemisphere which appeared quasiperiodic at 5 to 7 seconds.   Patient had cardiorespiratory arrest on 11/29/2020 and had CPR at 2255.  After ROSC, EEG showed continuous generalized spike and suppression which was not reactive to tactile stimulation.   ABNORMALITY - Continuous slow, generalized - Sharp waves, right hemisphere - EEG suppression, generalized  IMPRESSION: This study initially showed evidence of epileptogenicity in right hemisphere as well as severe diffuse encephalopathy, nonspecific etiology but could be secondary to sedation, seizures.  No seizures were seen during this time.  Patient had cardiorespiratory arrest on 11/29/2020 and had CPR at 2255 following which EEG was  suggestive of profound diffuse encephalopathy, nonspecific to etiology but due to recent cardiorespiratory arrest, could be due to anoxic/hypoxic brain injury.   Margaret Rangel

## 2020-12-15 NOTE — Progress Notes (Signed)
Patient extubated per withdrawal order with family and RN at the bedside.

## 2020-12-15 NOTE — Progress Notes (Signed)
[facility] Progress Note Patient Name: Margaret Rangel DOB: 15-Dec-1945 MRN: [id number]   Date of Service  12-27-20  HPI/Events of Note  Patient with severe shock, daughter advised that this is likely not survivable, patient is DNR at this time.  eICU Interventions  PRBC infusing (rate of infusion sped up), Albumin 5 % 500 ml + LR 500 ml iv bolus at 999 ml / hour, sodium bicarbonate 100 meq iv bolus x 1, patient is on Levophed + Vasopressin gtt, daughter encouraged to send for any family members who she would like to be at bedside in the event of demise.        Kerry Kass Jezreel Justiniano 12/27/20, 2:43 AM

## 2020-12-15 NOTE — Discharge Summary (Signed)
DEATH SUMMARY   Patient Details  Name: Margaret Rangel MRN: [id number] DOB: 10-17-1945  Admission/Discharge Information   Admit Date:  2020-11-24  Date of Death: Date of Death: 2020-12-06  Time of Death: Time of Death: 0929  Length of Stay: 2022-07-02  Referring Physician: Lyndee Hensen, DO   Reason(s) for Hospitalization  Status epilepticus.  Diagnoses  Preliminary cause of death:  Secondary Diagnoses (including complications and co-morbidities):  Active Problems:   Status epilepticus (HCC)   Severe sepsis with septic shock (HCC)   Meningoencephalitis   Cerebrovascular accident (CVA) ([place])   Altered mental status   Seizures ([place])   Acute respiratory failure ([place])   Pressure injury of skin   Encephalopathy acute   Cardiac arrest ([place])   Shock [facility])   Brief Hospital Course (including significant findings, care, treatment, and services provided and events leading to death)   Margaret Rangel is a 75 y.o. year old female who was found unresponsive with gaze deviation and right sided twitching.  Seizure activity persisted despite bolus versed. She was intubated and placed on continuous EEG monitoring. She was in supra-refractory status epilepticus and required a week of escalating sedation and anticonvulsants prior obtaining adequate control. An LP suggested a possible infectious etiology but all cultures were negative. 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  Once control of the seizure was obtained, sedation was stopped and allowed to wash out. She remained very weak and encephalopathic. We informed her family that recovery might not be complete and would definitely take long.  Unfortunately she develop sudden shock, likely due to myocardial infarction and developed DIC and MSOF. Once family arrived she was transitioned to comfort care and compassionately extubated.      Pertinent Labs and Studies  Significant  Diagnostic Studies CT ANGIO HEAD NECK W WO CM  Result Date: 11/22/2020 CLINICAL DATA:  Follow-up examination for acute stroke. EXAM: CT ANGIOGRAPHY HEAD AND NECK TECHNIQUE: Multidetector CT imaging of the head and neck was performed using the standard protocol during bolus administration of intravenous contrast. Multiplanar CT image reconstructions and MIPs were obtained to evaluate the vascular anatomy. Carotid stenosis measurements (when applicable) are obtained utilizing NASCET criteria, using the distal internal carotid diameter as the denominator. CONTRAST:  121m OMNIPAQUE IOHEXOL 350 MG/ML SOLN COMPARISON:  MRI from 11/21/2020.  She FINDINGS: CTA NECK FINDINGS Aortic arch: Visualized aortic arch normal in caliber with normal branch pattern. Moderate atheromatous change about the arch and origin of the great vessels without high-grade stenosis. Right carotid system: Scattered atheromatous change throughout the right CCA without high-grade stenosis. Mixed plaque about the right carotid bulb/proximal right ICA with associated stenosis of up to 40% by NASCET criteria. Multifocal irregularity and beading involving the distal right ICA suggestive of mild FMD. Left carotid system: Scattered atheromatous change within the left CCA with associated stenosis of up to approximately 50% by NASCET criteria. Superimposed penetrating plaque noted (series 8, image 233). Mixed plaque about the left carotid bulb/proximal left ICA with associated stenosis of up to 40% by NASCET criteria. Multifocal irregularity involving distal left ICA suggestive of mild FMD. Vertebral arteries: Both vertebral arteries arise from the subclavian arteries. No high-grade proximal subclavian artery stenosis. Atheromatous change at the origins of both vertebral arteries with associated moderate to severe ostial stenoses, right worse than left. Vertebral arteries otherwise patent within the neck without stenosis, dissection or occlusion. Skeleton:  No visible acute osseous finding.  No discrete osseous lesions. Moderate spondylosis present at C2-3 through C5-6. Other neck: Endotracheal and enteric tubes in place. No visible mass or adenopathy. Scattered fat stranding present within the supraclavicular regions bilaterally, nonspecific, but could be related to overall volume status. Upper chest: Small layering bilateral pleural effusions with associated atelectasis. Visualized upper chest demonstrates no other acute finding. Review of the MIP images confirms the above findings CTA HEAD FINDINGS Anterior circulation: Petrous segments patent bilaterally. Atheromatous change throughout the carotid siphons with associated moderate multifocal stenoses, right worse than left. A1 segments patent bilaterally. Right A1 hypoplastic. Normal anterior communicating artery complex. Anterior cerebral arteries irregular but remain patent to their distal aspects. M1 segments patent bilaterally. Normal MCA bifurcations. No proximal MCA branch occlusion. Multifocal atheromatous irregularity seen throughout the distal left MCA branches bilaterally. Posterior circulation: Both V4 segments patent to the vertebrobasilar junction without stenosis. Right PICA patent. Left PICA not definitely seen. Basilar diminutive but patent to its distal aspect without stenosis. Superior cerebral arteries grossly patent bilaterally. Right PCA supplied via the basilar. Fetal type origin left PCA. PCAs irregular but remain patent to their distal aspects without high-grade stenosis. Venous sinuses: Not well assessed due to timing the contrast bolus. Anatomic variants: Fetal type origin left PCA. Hypoplastic right A1 segment. Review of the MIP images confirms the above findings IMPRESSION: 1. Negative CTA for large vessel occlusion. 2. Atheromatous change about the carotid bifurcations/proximal ICAs with associated stenoses of up to 40% bilaterally. Additional 50% stenosis about the left CCA. 3.  Atheromatous change at the origins of both vertebral arteries with associated moderate to severe ostial stenoses, right worse than left. 4. Moderate to advanced atheromatous change throughout the intracranial circulation. Associated moderate multifocal stenoses involving the carotid siphons. No other proximal high-grade or correctable stenosis. 5. Irregularity and beading involving the distal cervical ICAs bilaterally, suggesting mild changes of FMD. Electronically Signed   By: Jeannine Boga M.D.   On: 11/22/2020 03:10   CT HEAD WO CONTRAST (5MM)  Result Date: 11/28/2020 CLINICAL DATA:  Stroke, follow-up, refractory seizures EXAM: CT HEAD WITHOUT CONTRAST TECHNIQUE: Contiguous axial images were obtained from the base of the skull through the vertex without intravenous contrast. COMPARISON:  Brain MRI 11/21/2020, CT head 11/17/2020, CT a head/neck 11/22/2020 FINDINGS: Brain: There are areas of encephalomalacia in the left frontal and occipital lobes consistent with remote infarcts. Small remote infarcts in the bilateral basal ganglia and left thalamus are unchanged. The smaller acute to subacute seen in the left cerebral hemisphere on the prior MRI are not well seen on the current study. There is no new territorial infarct. There is no evidence of acute intracranial hemorrhage or extra-axial fluid collection. The ventricles are stable in size, with unchanged ex vacuo dilatation of the left frontal horn. No mass lesion is identified. There is no midline shift. Vascular: There is calcification of the bilateral cavernous ICAs. Skull: Normal. Negative for fracture or focal lesion. Sinuses/Orbits: The imaged paranasal sinuses are clear. The mastoid air cells are clear. The imaged globes and orbits are unremarkable. Other: None. IMPRESSION: 1. The small acute to subacute infarcts in the left cerebral hemisphere on the prior MRI are not well seen on the current study. No new large territorial infarct or acute  intracranial hemorrhage on the current study. 2. Remote infarcts in the left cerebral hemisphere as above, unchanged. Electronically Signed   By: Valetta Mole M.D.   On: 11/28/2020 12:13   CT HEAD WO CONTRAST (5MM)  Result Date:  11/29/2020 CLINICAL DATA:  Mental status change, unknown cause. Patient had multiple seizures with AMS. EXAM: CT HEAD WITHOUT CONTRAST TECHNIQUE: Contiguous axial images were obtained from the base of the skull through the vertex without intravenous contrast. COMPARISON:  CT head 02/28/2019 FINDINGS: Brain: Multiple remote infarcts in the left cerebral hemisphere including 2 in the frontal lobe and 1 in the occipital lobe as well as small remote infarcts in the bilateral basal ganglia and left thalamus are unchanged. There is no evidence of acute intracranial hemorrhage, extra-axial fluid collection, or infarct. There is slight ex vacuo dilatation of the left lateral ventricle due to the above described infarcts. The ventricles are otherwise normal in size and configuration. There is no midline shift. No mass lesion is identified. Vascular: There is calcification of the bilateral cavernous ICAs. Skull: Normal. Negative for fracture or focal lesion. Sinuses/Orbits: The imaged paranasal sinuses are clear. Bilateral lens implants are noted. The imaged orbits are otherwise unremarkable. Other: The mastoid air cells are clear. IMPRESSION: No acute intracranial pathology. 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 Electronically Signed   By: Valetta Mole MD   On: 12/11/2020 12:32   CT Cervical Spine Wo Contrast  Result Date: 11/29/2020 CLINICAL DATA:  Neck trauma, intoxicated or obtunded EXAM: CT CERVICAL SPINE WITHOUT CONTRAST TECHNIQUE: Multidetector CT imaging of the cervical spine was performed without intravenous contrast. Multiplanar CT image  reconstructions were also generated. COMPARISON:  CTA neck 02/27/2019 FINDINGS: Alignment: There is reversal of the normal cervical spine lordosis. There is trace retrolisthesis of C3 on C4 for, unchanged and likely degenerative in nature. Skull base and vertebrae: Vertebral body heights are preserved. There is no evidence of acute fracture there is no suspicious osseous lesion. Soft tissues and spinal canal: No prevertebral fluid or swelling. No visible canal hematoma. There is bulky calcification of the ligamentum flavum at C7-T1. Disc levels: There is marked multilevel intervertebral disc space narrowing with vacuum disc phenomenon at C2-C3 and C5-C6. There is associated uncovertebral and facet arthropathy. There partially calcified disc protrusions at C3-C4 and C5-C6. Findings result in at least mild spinal canal stenosis and moderate bilateral neural foraminal stenosis at C3-C4 and C5-C6. Upper chest: The lung apices are clear. Other: Endotracheal and enteric catheters are noted, incompletely imaged. There is bulky calcified atherosclerotic plaque of the bilateral carotid bulbs. IMPRESSION: No acute fracture or traumatic malalignment of the cervical spine. Multilevel degenerative changes as above, most advanced at C3-C4 and C5-C6. Electronically Signed   By: Valetta Mole MD   On: 11/28/2020 12:43   MR BRAIN W WO CONTRAST  Result Date: 11/21/2020 CLINICAL DATA:  Seizure, refractory. EXAM: MRI HEAD WITHOUT AND WITH CONTRAST TECHNIQUE: Multiplanar, multiecho pulse sequences of the brain and surrounding structures were obtained without and with intravenous contrast. CONTRAST:  6.44m GADAVIST GADOBUTROL 1 MMOL/ML IV SOLN COMPARISON:  Head CT November 18, 2020. FINDINGS: Brain: Areas of restricted diffusion scattered in the left temporal occipital, parietal and frontal lobes, consistent with acute/subacute infarct. There is also mild restricted diffusion involving the left hippocampus and thalamic pulvinar, may be  related to seizure activity. Multiple small remote infarcts in the left frontal lobe, parietooccipital region, bilateral basal ganglia, thalami, pons and right cerebellar hemisphere. Scattered and confluent foci of T2 hyperintensity are seen within the white matter of the cerebral hemispheres and within the pons, nonspecific, most likely related to chronic small  vessel ischemia. Vascular: Normal flow voids. Skull and upper cervical spine: Degenerative changes in the visualized upper cervical spine. Sinuses/Orbits: Bilateral lens surgery. Paranasal sinuses are clear. Other: Bilateral mastoid effusion. IMPRESSION: 1. Multiple small foci of restricted diffusion scattered in the left cerebral hemisphere, in a watershed distribution, consistent with acute/subacute infarct. 2. Mildly restricted diffusion involving the left hippocampus and left thalamus is most likely related to seizure activity in the setting of refractory seizures. 3. Moderate chronic microvascular ischemic changes of the white matter. 4. Remote lacunar infarcts in the left frontal lobe, parietooccipital region, bilateral basal ganglia, thalami, pons and right cerebellar hemisphere. Electronically Signed   By: Pedro Earls M.D.   On: 11/21/2020 13:40   CT CHEST ABDOMEN PELVIS W CONTRAST  Result Date: 11/24/2020 CLINICAL DATA:  Cancer of unknown primary. EXAM: CT CHEST, ABDOMEN, AND PELVIS WITH CONTRAST TECHNIQUE: Multidetector CT imaging of the chest, abdomen and pelvis was performed following the standard protocol during bolus administration of intravenous contrast. CONTRAST:  128m OMNIPAQUE IOHEXOL 350 MG/ML SOLN COMPARISON:  June 24, 2017. FINDINGS: CT CHEST FINDINGS Cardiovascular: Atherosclerosis of thoracic aorta is noted without aneurysm or dissection. Normal cardiac size. No pericardial effusion. Mediastinum/Nodes: Endotracheal and nasogastric tubes appear to be in grossly good position. Thyroid gland is unremarkable. No  adenopathy is noted. Lungs/Pleura: Small bilateral pleural effusions are noted with adjacent subsegmental atelectasis. No pneumothorax is noted. Musculoskeletal: No chest wall mass or suspicious bone lesions identified. CT ABDOMEN PELVIS FINDINGS Hepatobiliary: No focal liver abnormality is seen. No gallstones, gallbladder wall thickening, or biliary dilatation. Pancreas: Unremarkable. No pancreatic ductal dilatation or surrounding inflammatory changes. Spleen: Normal in size without focal abnormality. Adrenals/Urinary Tract: Adrenal glands appear normal. Kidneys are unremarkable. No hydronephrosis or renal obstruction is noted. Urinary bladder is decompressed secondary to Foley catheter. Stomach/Bowel: Nasogastric tube tip is seen in the proximal duodenum. The stomach is otherwise unremarkable. No abnormal bowel dilatation is noted. The appendix appears normal. Rectal tube is noted. Vascular/Lymphatic: Aortic atherosclerosis. No enlarged abdominal or pelvic lymph nodes. Reproductive: Status post hysterectomy. No adnexal masses. Other: No abdominal wall hernia or abnormality. No abdominopelvic ascites. Musculoskeletal: No acute or significant osseous findings. IMPRESSION: Small bilateral pleural effusions with adjacent subsegmental atelectasis. Endotracheal tube is in grossly good position. Distal tip of nasogastric tube appears to be in proximal duodenum. No acute abnormality is noted in the abdomen or pelvis. Aortic Atherosclerosis (ICD10-I70.0). Electronically Signed   By: JMarijo ConceptionM.D.   On: 11/24/2020 19:35   DG Chest Port 1 View  Result Date: 11/29/2020 CLINICAL DATA:  Dyspnea. EXAM: PORTABLE CHEST 1 VIEW COMPARISON:  Chest x-ray 11/28/2020, CT chest 11/24/2020 FINDINGS: The endotracheal tube terminates 4 cm above the carina. Interval replacement of an enteric tube with tip overlying the expected region of the pyloric region/first portion of the duodenum. The entire enteric tube is not visualized as  it is collimated off view over the abdomen. Right PICC with tip overlying the expected region of the superior cavoatrial junction. Cardiac paddles overlie the patient. The heart size and mediastinal contours are unchanged. Aortic calcifications. No focal consolidation. No pulmonary edema. No pleural effusion. No pneumothorax. No acute osseous abnormality. IMPRESSION: 1. No active disease. 2. Lines and tubes as described above. Electronically Signed   By: MIven FinnM.D.   On: 11/29/2020 23:35   DG Chest Port 1 View  Result Date: 11/28/2020 CLINICAL DATA:  Intubation, acute respiratory failure with hypoxia, history diabetes mellitus, hypertension, stroke EXAM:  PORTABLE CHEST 1 VIEW COMPARISON:  Portable exam 0527 hours compared to 11/19/2020 FINDINGS: Tip of endotracheal tube projects 4.8 cm above carina. Nasogastric tube extends into stomach. RIGHT arm PICC line tip projects over SVC. Normal heart size, mediastinal contours, and pulmonary vascularity. Atelectasis versus consolidation LEFT lower lobe with small associated LEFT pleural effusion. Small sub pulmonic perfusion on RIGHT. Upper lungs clear. No pneumothorax. Atherosclerotic calcifications aorta. IMPRESSION: Atelectasis versus consolidation LEFT lower lobe with small bibasilar pleural effusions. Aortic Atherosclerosis (ICD10-I70.0). Electronically Signed   By: Lavonia Dana M.D.   On: 11/28/2020 08:22   DG Chest Port 1 View  Result Date: 11/19/2020 CLINICAL DATA:  Respiratory failure. EXAM: PORTABLE CHEST 1 VIEW COMPARISON:  11/14/2020 FINDINGS: Endotracheal tube tip appears to be at the carina and near the right mainstem bronchus. Nasogastric tube extends into the abdomen but the tip is beyond the image. Slightly prominent interstitial lung markings without focal airspace disease or overt pulmonary edema. Heart size is stable. Negative for a pneumothorax. IMPRESSION: Endotracheal tube is at the carina and near the right mainstem bronchus.  Endotracheal tube could be retracted 2-3 cm. No focal lung densities. These results will be called to the ordering clinician or representative by the Radiologist Assistant, and communication documented in the PACS or [employer]. Electronically Signed   By: Markus Daft M.D.   On: 11/19/2020 09:03   DG Chest Portable 1 View  Result Date: 11/26/2020 CLINICAL DATA:  Status post intubation. EXAM: PORTABLE CHEST 1 VIEW COMPARISON:  03/07/2016 FINDINGS: Tip of the ET tube is 1.2 cm above the carina. There is a NG tube with tip below the GE junction in the expected location of the gastric fundus. No pleural effusion or edema identified. No airspace opacities identified. Remote healed left posterior rib fractures are again seen. IMPRESSION: Satisfactory position of NG tube and ET tube. Lungs are clear. Electronically Signed   By: Kerby Moors M.D.   On: 11/27/2020 11:24   DG Abd Portable 1V  Result Date: 11/28/2020 CLINICAL DATA:  Feeding tube placement EXAM: PORTABLE ABDOMEN - 1 VIEW COMPARISON:  KUB 01/23/2021 FINDINGS: There is an enteric catheter in place with the tip projecting over the expected location of the pylorus/first portion of the duodenum. There is a nonobstructive bowel gas pattern. There is no gross organomegaly or abnormal soft tissue calcification. There is no acute osseous abnormality. There is calcified atherosclerotic plaque of the aortic arch. IMPRESSION: Enteric catheter tip terminates over the expected location of the pylorus/first portion of the duodenum. Electronically Signed   By: Valetta Mole M.D.   On: 11/28/2020 11:37   DG Abd Portable 1V  Result Date: 11/23/2020 CLINICAL DATA:  Altered mental status, unresponsive, nasogastric tube placement EXAM: PORTABLE ABDOMEN - 1 VIEW COMPARISON:  Portable exam 0855 hours compared to 05/15/2015 FINDINGS: Tip of nasogastric tube projects over pylorus. Paucity of bowel gas. No bowel dilatation or bowel wall thickening. Degenerative disc  disease changes lumbar spine. Visualized lung bases clear. IMPRESSION: Tip of nasogastric tube projects over pylorus. Electronically Signed   By: Lavonia Dana M.D.   On: 11/23/2020 11:34   Overnight EEG with video  Result Date: 11/19/2020 Lora Havens, MD     11/20/2020  9:18 AM Patient Name: Margaret Rangel MRN: [id number] Epilepsy Attending: Lora Havens Referring Physician/Provider: Dr Donnetta Simpers Duration: 11/24/2020 1120 to 11/19/2020 1120 Patient history:  75 y.o. female with PMH significant for prior stroke who presented via EMS with unresponsiveness and seizures.  Had back to back seizures with no return to baseline in between. EEG due to concern for status epilepticus. Level of alertness:  comatose AEDs during EEG study: LEV, VPA, propofol Technical aspects: This EEG study was done with scalp electrodes positioned according to the 10-20 International system of electrode placement. Electrical activity was acquired at a sampling rate of '500Hz'$  and reviewed with a high frequency filter of '70Hz'$  and a low frequency filter of '1Hz'$ . EEG data were recorded continuously and digitally stored. Description: EEG showed continuous generalized and lateralized left hemisphere 3 to 6 Hz theta-delta slowing. Lateralized periodic discharges ( LPDs) were also in left hemisphere every 5 seconds. At the beginning of study , LPDs at times appeared rhythmic lasting 2-3 seconds consistent with brief ictal-interictal discharges ( BIRDS). Gradually the frequency of LPDs improved and the EEG showed spikes arising from left hemisphere every 10-15 seconds followed by rhythmic polymorphic generalized 3-'5hz'$  theta-delta slowing followed  by 2-3 seconds of generalized attenuation. After around 0445 on 11/19/2020, eeg showed generalized spikes with shifting left and right frontal predominance. Gradually, after propofol was weaned off eeg showed seizures without clinical signs arising from left hemisphere, avg 1-2 /hour, avg 1 minute.  Propofol was resumed and EEG again showed intermittent generalized 3-'5hz'$  theta-delta slowing lasting 1-3 seconds and eeg attenuation lasting 1-3 seconds. Event button was pressed on 11/19/2020 at 0907. Per Dr Lorrin Goodell patient has R gaze deviation, Rightwards head twitching and RUE twitching. Concomitant eeg showed 5-'6hz'$  theta slowing in left hemisphere which them spread to right hemisphere and evolved into 2-'3hz'$  delta slowing consistent with seizure. ABNORMALITY - Seizure without clinical sign, left hemisphere - Lateralized periodic discharges ( LPD ) left hemisphere - Brief ictal-interictal discharges ( BIRDS), left hemisphere -Spike,generalized - Intermittent rhythmic slow, generalized - Background attenuation, generalized - Continuous slow, generalized IMPRESSION: This study was initially suggestive of epileptogenicity and cortical dysfunction arising from left hemisphere with high potential for seizure recurrence.  After titrating AEDs, EEG improved. However, on 11/19/2020 after around 0445, EEG showed epileptogenicity with generalized onset and shifting predominance in left and right frontal region. Additionally, there was rhythmic slowing which is on the ictal-interictal continuum with low potential for seizure recurrence. On 11/19/2020, after propofol was weaned, eeg showed one focal seizure at 0907 as described above as well as seizures without clinical signs, arising from left hemisphere, avg 1-2/hour, lasting about 1 minute. Propofol,was resumed again after which eeg was suggestive of moderate diffuse encephalopathy, non specific etiology but likely due to seizure and sedation. Lora Havens   ECHOCARDIOGRAM COMPLETE BUBBLE STUDY  Result Date: 11/22/2020    ECHOCARDIOGRAM REPORT   Patient Name:   Margaret Rangel Date of Exam: 11/22/2020 Medical Rec #:  [id number]        Height:       63.0 in Accession #:    [id number]       Weight:       145.7 lb Date of Birth:  Jul 11, 1945        BSA:          1.690 m  Patient Age:    71 years         BP:           138/46 mmHg Patient Gender: F                HR:           57 bpm. Exam Location:  Inpatient Procedure: 2D Echo, Cardiac Doppler, Color  Doppler and [place] Contrast Bubble            Study Indications:    CVA  History:        Patient has prior history of Echocardiogram examinations, most                 recent 02/28/2019. Stroke, Signs/Symptoms:Murmur and Chest Pain;                 Risk Factors:Diabetes, Hypertension and Dyslipidemia.  Sonographer:    Luisa Hart RDCS Referring Phys: [id number] Lora Havens  Sonographer Comments: Echo performed with patient supine and on artificial respirator. Image acquisition challenging due to respiratory motion. IMPRESSIONS  1. Left ventricular ejection fraction, by estimation, is 70 to 75%. The left ventricle has hyperdynamic function. The left ventricle has no regional wall motion abnormalities. There is severe left ventricular hypertrophy. Left ventricular diastolic parameters are consistent with Grade I diastolic dysfunction (impaired relaxation).  2. Right ventricular systolic function is normal. The right ventricular size is normal. There is normal pulmonary artery systolic pressure.  3. The mitral valve is normal in structure. No evidence of mitral valve regurgitation. No evidence of mitral stenosis.  4. The aortic valve is normal in structure. There is moderate calcification of the aortic valve. There is moderate thickening of the aortic valve. Aortic valve regurgitation is mild to moderate. Mild to moderate aortic valve sclerosis/calcification is present, without any evidence of aortic stenosis. Aortic regurgitation PHT measures 688 msec. Aortic valve mean gradient measures 10.0 mmHg. Aortic valve Vmax measures 2.17 m/s.  5. The inferior vena cava is normal in size with greater than 50% respiratory variability, suggesting right atrial pressure of 3 mmHg.  6. Agitated saline contrast bubble study was negative, with no  evidence of any interatrial shunt. Comparison(s): No significant change from prior study. Conclusion(s)/Recommendation(s): No intracardiac source of embolism detected on this transthoracic study. A transesophageal echocardiogram is recommended to exclude cardiac source of embolism if clinically indicated. FINDINGS  Left Ventricle: Left ventricular ejection fraction, by estimation, is 70 to 75%. The left ventricle has hyperdynamic function. The left ventricle has no regional wall motion abnormalities. The left ventricular internal cavity size was normal in size. There is severe left ventricular hypertrophy. Left ventricular diastolic parameters are consistent with Grade I diastolic dysfunction (impaired relaxation). Right Ventricle: The right ventricular size is normal. No increase in right ventricular wall thickness. Right ventricular systolic function is normal. There is normal pulmonary artery systolic pressure. The tricuspid regurgitant velocity is 1.95 m/s, and  with an assumed right atrial pressure of 3 mmHg, the estimated right ventricular systolic pressure is 123XX123 mmHg. Left Atrium: Left atrial size was normal in size. Right Atrium: Right atrial size was normal in size. Pericardium: There is no evidence of pericardial effusion. Mitral Valve: The mitral valve is normal in structure. No evidence of mitral valve regurgitation. No evidence of mitral valve stenosis. Tricuspid Valve: The tricuspid valve is normal in structure. Tricuspid valve regurgitation is not demonstrated. No evidence of tricuspid stenosis. Aortic Valve: The aortic valve is normal in structure. There is moderate calcification of the aortic valve. There is moderate thickening of the aortic valve. Aortic valve regurgitation is mild to moderate. Aortic regurgitation PHT measures 688 msec. Mild  to moderate aortic valve sclerosis/calcification is present, without any evidence of aortic stenosis. Aortic valve mean gradient measures 10.0 mmHg. Aortic  valve peak gradient measures 18.8 mmHg. Aortic valve area, by VTI measures 1.19 cm. Pulmonic Valve: The  pulmonic valve was normal in structure. Pulmonic valve regurgitation is not visualized. No evidence of pulmonic stenosis. Aorta: The aortic root is normal in size and structure. Venous: The inferior vena cava is normal in size with greater than 50% respiratory variability, suggesting right atrial pressure of 3 mmHg. IAS/Shunts: No atrial level shunt detected by color flow Doppler. Agitated saline contrast was given intravenously to evaluate for intracardiac shunting. Agitated saline contrast bubble study was negative, with no evidence of any interatrial shunt.  LEFT VENTRICLE PLAX 2D LVIDd:         3.20 cm     Diastology LV PW:         1.50 cm     LV e' medial:    3.68 cm/s LV IVS:        1.80 cm     LV E/e' medial:  25.4 LVOT diam:     1.60 cm     LV e' lateral:   5.34 cm/s LV SV:         46          LV E/e' lateral: 17.5 LV SV Index:   27 LVOT Area:     2.01 cm  LV Volumes (MOD) LV vol d, MOD A4C: 32.9 ml LV vol s, MOD A4C: 6.1 ml LV SV MOD A4C:     32.9 ml RIGHT VENTRICLE RV Basal diam:  2.10 cm     PULMONARY VEINS RV Mid diam:    1.40 cm     A Reversal Duration: 82.00 msec RV S prime:     14.20 cm/s  A Reversal Velocity: 65.30 cm/s TAPSE (M-mode): 2.0 cm      Diastolic Velocity:  123456 cm/s                             S/D Velocity:        1.20                             Systolic Velocity:   0000000 cm/s LEFT ATRIUM             Index       RIGHT ATRIUM          Index LA diam:        3.20 cm 1.89 cm/m  RA Area:     6.14 cm LA Vol (A2C):   38.9 ml 23.01 ml/m RA Volume:   7.38 ml  4.37 ml/m LA Vol (A4C):   11.9 ml 7.04 ml/m LA Biplane Vol: 22.7 ml 13.43 ml/m  AORTIC VALVE                    PULMONIC VALVE AV Area (Vmax):    1.38 cm     PV Vmax:       1.14 m/s AV Area (Vmean):   1.35 cm     PV Vmean:      72.400 cm/s AV Area (VTI):     1.19 cm     PV VTI:        0.219 m AV Vmax:           217.00 cm/s  PV  Peak grad:  5.2 mmHg AV Vmean:          146.000 cm/s PV Mean grad:  3.0 mmHg AV VTI:            0.389  m AV Peak Grad:      18.8 mmHg AV Mean Grad:      10.0 mmHg LVOT Vmax:         149.00 cm/s LVOT Vmean:        98.100 cm/s LVOT VTI:          0.230 m LVOT/AV VTI ratio: 0.59 AI PHT:            688 msec  AORTA Ao Root diam: 3.00 cm Ao Asc diam:  2.60 cm MITRAL VALVE                TRICUSPID VALVE MV Area (PHT): 2.84 cm     TR Peak grad:   15.2 mmHg MV Decel Time: 267 msec     TR Vmax:        195.00 cm/s MV E velocity: 93.40 cm/s MV A velocity: 101.00 cm/s  SHUNTS MV E/A ratio:  0.92         Systemic VTI:  0.23 m                             Systemic Diam: 1.60 cm Candee Furbish MD Electronically signed by Candee Furbish MD Signature Date/Time: 11/22/2020/12:01:25 PM    Final    [place] EKG SITE RITE  Result Date: 11/19/2020 If Site Rite image not attached, placement could not be confirmed due to current cardiac rhythm.   Microbiology Recent Results (from the past 240 hour(s))  CSF culture w Stat Gram Stain     Status: None   Collection Time: 11/24/20  5:10 PM   Specimen: CSF; Cerebrospinal Fluid  Result Value Ref Range Status   Specimen Description CSF  Final   Special Requests NONE  Final   Gram Stain   Final    WBC PRESENT, PREDOMINANTLY MONONUCLEAR NO ORGANISMS SEEN CYTOSPIN SMEAR    Culture   Final    NO GROWTH 3 DAYS Performed at [facility], [address], [place], [place] [address]    Report Status 11/27/2020 FINAL  Final    Lab Basic Metabolic Panel: Recent Labs  Lab 11/27/20 0500 11/28/20 0505 11/29/20 0524 11/29/20 2306 11/29/20 2311 2020/12/26 0416 12/26/2020 0447  NA 148* 151* 151* 146* 150* 145 131*  K 3.7 3.6 3.2* 4.9 5.8* 6.5* 5.6*  CL 121* 118* 116* 115*  --   --  99  CO2 21* 27 27 12*  --   --  11*  GLUCOSE 310* 238* 212* 432*  --   --  823*  BUN 24* 35* 42* 53*  --   --  44*  CREATININE 0.93 1.07* 0.96 1.90*  --   --  2.16*  CALCIUM 7.2* 7.3* 7.5* 6.5*  --   --  6.6*   MG  --  2.5* 2.5* 2.6*  --   --   --   PHOS  --  3.2 2.2* 3.8  --   --   --    Liver Function Tests: No results for input(s): AST, ALT, ALKPHOS, BILITOT, PROT, ALBUMIN in the last 168 hours. No results for input(s): LIPASE, AMYLASE in the last 168 hours. Recent Labs  Lab 11/29/20 0518  AMMONIA 47*   CBC: Recent Labs  Lab 11/26/20 0500 11/27/20 0500 11/28/20 0505 11/29/20 0524 11/29/20 2311 12-26-2020 0210 Dec 26, 2020 0416 12/26/20 0447  WBC 10.5 7.8 8.9 10.3  --  14.5*  --  12.1*  NEUTROABS 9.4* 6.5 6.5  7.9*  --   --   --  7.8*  HGB 8.5* 8.1* 9.0* 10.0* 8.5* 6.9* 6.5* 6.2*  HCT 27.4* 26.4* 28.6* 31.3* 25.0* 22.3* 19.0* 21.0*  MCV 81.3 82.0 80.6 79.0*  --  91.4  --  96.8  PLT 258 290 316 373  --  110*  --  78*   Cardiac Enzymes: No results for input(s): CKTOTAL, CKMB, CKMBINDEX, TROPONINI in the last 168 hours. Sepsis Labs: Recent Labs  Lab 11/28/20 0505 11/29/20 0524 11/29/20 2306 12/18/2020 0210 12/18/20 0447  WBC 8.9 10.3  --  14.5* 12.1*  LATICACIDVEN  --   --  >11*  --  >11*    Procedures/Operations  Continuous EEG monitoring, mechanical ventilation.    Donie Lemelin 12/01/2020, 1:17 PM

## 2020-12-15 NOTE — Significant Event (Addendum)
PCCM INTERVAL PROGRESS NOTE   Called to bedside for code blue.   Briefly, this is a patient on day 11 of admission for new onset status epilepticus who required deep sedation including phenobarbital coma. She has been off sedation for a couple of days and is yet to recover neurologically. Lingering sedation vs neurologic injury.   8/16 late PM she rapidly developed AF and became profoundly bradycardic to the point of cardiac arrest PEA. Nursing documentation notes 8 mins CPR. When I arrived to bedside patient already had ROSC. Etiology of arrest unclear. POCUS post code consistent with hypovolemia. Is responding to volume + pressors. MAPs remain borderline low despite volume, NE at 40, and Vaso at 0.03. Unable to place arterial line.   Dark stools noted which are malodorous for GI bleed. One unit of blood ordered pending labs.   BMP Latest Ref Rng & Units 11/29/2020 11/29/2020 11/29/2020  Glucose 70 - 99 mg/dL - 432(H) 212(H)  BUN 8 - 23 mg/dL - 53(H) 42(H)  Creatinine 0.44 - 1.00 mg/dL - 1.90(H) 0.96  BUN/Creat Ratio 12 - 28 - - -  Sodium 135 - 145 mmol/L 150(H) 146(H) 151(H)  Potassium 3.5 - 5.1 mmol/L 5.8(H) 4.9 3.2(L)  Chloride 98 - 111 mmol/L - 115(H) 116(H)  CO2 22 - 32 mmol/L - 12(L) 27  Calcium 8.9 - 10.3 mg/dL - 6.5(L) 7.5(L)   CBC pending  Lactic Acid, Venous    Component Value Date/Time   LATICACIDVEN >11 [facility]) 11/29/2020 2306    Troponin 1314  Cardiac arrest: etiology uncertain. Hypovolemia? GI bleed?  - giving back volume - one unit PRBC - CBC pending - Trend troponin - Occult blood test, if negative would start anticoagulation for elevated troponin.   Shock: hypovolemic +/- cardiogenic post arrest - NE, vaso, volume for MAP goal, 65  Lactic acidosis - Trend lactic.  - Check beta hydroxybutyrate with high sugars to ensure no element of DKA.   Discussed goals of care with daughter and son. In light of acute decompensation and guarded prognosis they elect to enact  DNR at this time. If things turn around by the morning they will want to reconsider this.    Critical care time 52 minutes.    Georgann Housekeeper, AGACNP-BC [facility]  See Amion for personal pager PCCM on call pager [phone] until 7pm. Please call Elink 7p-7a. [phone]  2020-12-08 12:23 AM

## 2020-12-15 NOTE — Progress Notes (Signed)
Critical ABG RN notified.

## 2020-12-15 NOTE — Progress Notes (Signed)
[facility] Progress Note Patient Name: Margaret Rangel DOB: 1946-03-28 MRN: [id number]   Date of Service  12/28/2020  HPI/Events of Note  Patient is markedly coagulopathic (PT > 90, INR  > 100, creatinine 2.16, K+ 5.6.  eICU Interventions  2 units FFP ordered, 10 mg Vitamin K ordered, Lokelma 10 gm x 1 ordered, patient's prognosis is extremely poor.        Kerry Kass Kay Ricciuti 12-28-2020, 6:40 AM

## 2020-12-15 NOTE — Progress Notes (Signed)
   12/10/2020 0315  Clinical Encounter Type  Visited With Patient and family together  Visit Type Patient actively dying  Referral From Nurse  Consult/Referral To Chaplain  Spiritual Encounters  Spiritual Needs Prayer;Emotional  Stress Factors  Family Stress Factors Major life changes   Due to Margaret Rangel cardiac arrest the doctor informed the family of her critical condition.  Chaplain offered prayer comfort and support to Ms. [facility]. Informed the nurse and the daughters if they need me to return please page for the chaplain.    Chaplain Beverlyn Mcginness Morgan-Simpson [phone]

## 2020-12-15 NOTE — Progress Notes (Signed)
   22-Dec-2020 [id number]  Clinical Encounter Type  Visited With Health care provider  Visit Type Follow-up;Patient actively dying  Referral From Nurse  Consult/Referral To Chaplain   Chaplain responded to page. Pt's family was not present when chaplain arrived. Chaplain remains available.   This note was prepared by Chaplain Resident, Dante Gang, MDiv. Chaplain remains available as needed through the on-call pager: [phone].

## 2020-12-15 DEATH — deceased

## 2020-12-17 LAB — MISC LABCORP TEST (SEND OUT)
Labcorp test code: 9985
Labcorp test code: 9985

## 2020-12-20 MED FILL — Medication: Qty: 1 | Status: AC

## 2021-01-13 LAB — ACID FAST CULTURE WITH REFLEXED SENSITIVITIES (MYCOBACTERIA): Acid Fast Culture: NEGATIVE

## 2021-01-31 ENCOUNTER — Ambulatory Visit: Payer: Medicare Other | Admitting: Neurology
